# Patient Record
Sex: Female | Born: 1955 | Race: White | Hispanic: No | Smoking: Former smoker
Health system: Southern US, Community
[De-identification: ages and names within clinical notes are randomized; demographics above are authoritative.]

## PROBLEM LIST (undated history)

## (undated) DIAGNOSIS — IMO0001 Reserved for inherently not codable concepts without codable children: Secondary | ICD-10-CM

## (undated) DIAGNOSIS — E1169 Type 2 diabetes mellitus with other specified complication: Secondary | ICD-10-CM

## (undated) DIAGNOSIS — I10 Essential (primary) hypertension: Secondary | ICD-10-CM

## (undated) DIAGNOSIS — I480 Paroxysmal atrial fibrillation: Secondary | ICD-10-CM

## (undated) DIAGNOSIS — I201 Angina pectoris with documented spasm: Secondary | ICD-10-CM

## (undated) DIAGNOSIS — A0472 Enterocolitis due to Clostridium difficile, not specified as recurrent: Secondary | ICD-10-CM

## (undated) DIAGNOSIS — R079 Chest pain, unspecified: Secondary | ICD-10-CM

## (undated) DIAGNOSIS — K219 Gastro-esophageal reflux disease without esophagitis: Secondary | ICD-10-CM

## (undated) DIAGNOSIS — I5042 Chronic combined systolic (congestive) and diastolic (congestive) heart failure: Secondary | ICD-10-CM

## (undated) DIAGNOSIS — E669 Obesity, unspecified: Secondary | ICD-10-CM

## (undated) DIAGNOSIS — E785 Hyperlipidemia, unspecified: Secondary | ICD-10-CM

## (undated) DIAGNOSIS — J449 Chronic obstructive pulmonary disease, unspecified: Secondary | ICD-10-CM

## (undated) DIAGNOSIS — G8929 Other chronic pain: Secondary | ICD-10-CM

## (undated) DIAGNOSIS — J9610 Chronic respiratory failure, unspecified whether with hypoxia or hypercapnia: Secondary | ICD-10-CM

## (undated) DIAGNOSIS — Z8679 Personal history of other diseases of the circulatory system: Secondary | ICD-10-CM

## (undated) DIAGNOSIS — Z720 Tobacco use: Secondary | ICD-10-CM

## (undated) HISTORY — DX: Hyperlipidemia, unspecified: E78.5

## (undated) HISTORY — DX: Tobacco use: Z72.0

## (undated) HISTORY — DX: Chronic obstructive pulmonary disease, unspecified: J44.9

## (undated) HISTORY — DX: Other chronic pain: G89.29

## (undated) HISTORY — PX: CHOLECYSTECTOMY: SHX55

## (undated) HISTORY — DX: Gastro-esophageal reflux disease without esophagitis: K21.9

## (undated) HISTORY — DX: Essential (primary) hypertension: I10

## (undated) HISTORY — PX: PARTIAL HYSTERECTOMY: SHX80

## (undated) HISTORY — PX: ANGIOPLASTY: SHX39

## (undated) HISTORY — DX: Morbid (severe) obesity due to excess calories: E66.01

---

## 1997-10-07 ENCOUNTER — Emergency Department (HOSPITAL_COMMUNITY): Admission: EM | Admit: 1997-10-07 | Discharge: 1997-10-07 | Payer: Self-pay | Admitting: Emergency Medicine

## 1998-01-25 ENCOUNTER — Emergency Department (HOSPITAL_COMMUNITY): Admission: EM | Admit: 1998-01-25 | Discharge: 1998-01-25 | Payer: Self-pay | Admitting: Emergency Medicine

## 1998-01-25 ENCOUNTER — Encounter: Payer: Self-pay | Admitting: Emergency Medicine

## 2004-03-12 ENCOUNTER — Inpatient Hospital Stay (HOSPITAL_COMMUNITY): Admission: EM | Admit: 2004-03-12 | Discharge: 2004-03-15 | Payer: Self-pay | Admitting: Emergency Medicine

## 2004-03-17 ENCOUNTER — Emergency Department (HOSPITAL_COMMUNITY): Admission: EM | Admit: 2004-03-17 | Discharge: 2004-03-17 | Payer: Self-pay | Admitting: Emergency Medicine

## 2006-01-15 ENCOUNTER — Emergency Department (HOSPITAL_COMMUNITY): Admission: EM | Admit: 2006-01-15 | Discharge: 2006-01-15 | Payer: Self-pay | Admitting: Emergency Medicine

## 2009-08-15 ENCOUNTER — Emergency Department (HOSPITAL_COMMUNITY): Admission: EM | Admit: 2009-08-15 | Discharge: 2009-08-16 | Payer: Self-pay | Admitting: Emergency Medicine

## 2009-11-15 ENCOUNTER — Ambulatory Visit: Payer: Self-pay | Admitting: Internal Medicine

## 2009-11-15 ENCOUNTER — Inpatient Hospital Stay (HOSPITAL_COMMUNITY): Admission: RE | Admit: 2009-11-15 | Discharge: 2009-11-20 | Payer: Self-pay | Admitting: Surgery

## 2009-12-03 HISTORY — PX: UMBILICAL HERNIA REPAIR: SHX196

## 2010-05-18 LAB — BLOOD GAS, ARTERIAL
Acid-Base Excess: 5.2 mmol/L — ABNORMAL HIGH (ref 0.0–2.0)
Bicarbonate: 31.1 mEq/L — ABNORMAL HIGH (ref 20.0–24.0)
FIO2: 0.5 %
MECHVT: 550 mL
TCO2: 28 mmol/L (ref 0–100)
TCO2: 29.1 mmol/L (ref 0–100)
pCO2 arterial: 63.5 mmHg (ref 35.0–45.0)
pCO2 arterial: 64.1 mmHg (ref 35.0–45.0)
pH, Arterial: 7.308 — ABNORMAL LOW (ref 7.350–7.400)
pO2, Arterial: 95.1 mmHg (ref 80.0–100.0)

## 2010-05-18 LAB — CBC
HCT: 38.1 % (ref 36.0–46.0)
HCT: 38.2 % (ref 36.0–46.0)
Hemoglobin: 12.7 g/dL (ref 12.0–15.0)
Hemoglobin: 15.4 g/dL — ABNORMAL HIGH (ref 12.0–15.0)
MCH: 30.9 pg (ref 26.0–34.0)
MCH: 30.9 pg (ref 26.0–34.0)
MCHC: 33.7 g/dL (ref 30.0–36.0)
Platelets: 223 10*3/uL (ref 150–400)
RBC: 4.12 MIL/uL (ref 3.87–5.11)
RBC: 4.61 MIL/uL (ref 3.87–5.11)
RDW: 14.7 % (ref 11.5–15.5)
RDW: 14.4 % (ref 11.5–15.5)
WBC: 6.9 10*3/uL (ref 4.0–10.5)
WBC: 12.7 10*3/uL — ABNORMAL HIGH (ref 4.0–10.5)
WBC: 9.5 10*3/uL (ref 4.0–10.5)

## 2010-05-18 LAB — GLUCOSE, CAPILLARY
Glucose-Capillary: 127 mg/dL — ABNORMAL HIGH (ref 70–99)
Glucose-Capillary: 138 mg/dL — ABNORMAL HIGH (ref 70–99)
Glucose-Capillary: 138 mg/dL — ABNORMAL HIGH (ref 70–99)
Glucose-Capillary: 139 mg/dL — ABNORMAL HIGH (ref 70–99)
Glucose-Capillary: 150 mg/dL — ABNORMAL HIGH (ref 70–99)
Glucose-Capillary: 160 mg/dL — ABNORMAL HIGH (ref 70–99)
Glucose-Capillary: 176 mg/dL — ABNORMAL HIGH (ref 70–99)
Glucose-Capillary: 177 mg/dL — ABNORMAL HIGH (ref 70–99)
Glucose-Capillary: 191 mg/dL — ABNORMAL HIGH (ref 70–99)
Glucose-Capillary: 109 mg/dL — ABNORMAL HIGH (ref 70–99)
Glucose-Capillary: 128 mg/dL — ABNORMAL HIGH (ref 70–99)
Glucose-Capillary: 137 mg/dL — ABNORMAL HIGH (ref 70–99)
Glucose-Capillary: 153 mg/dL — ABNORMAL HIGH (ref 70–99)
Glucose-Capillary: 196 mg/dL — ABNORMAL HIGH (ref 70–99)
Glucose-Capillary: 196 mg/dL — ABNORMAL HIGH (ref 70–99)

## 2010-05-18 LAB — CREATININE, SERUM
GFR calc Af Amer: 60 mL/min — ABNORMAL LOW (ref >60)
GFR calc Af Amer: >60 mL/min (ref >60)
GFR calc non Af Amer: 49 mL/min — ABNORMAL LOW (ref >60)
GFR calc non Af Amer: >60 mL/min (ref >60)

## 2010-05-18 LAB — COMPREHENSIVE METABOLIC PANEL
Alkaline Phosphatase: 76 U/L (ref 39–117)
BUN: 21 mg/dL (ref 6–23)
Creatinine, Ser: 0.96 mg/dL (ref 0.4–1.2)
Glucose, Bld: 164 mg/dL — ABNORMAL HIGH (ref 70–99)
Potassium: 4.5 mEq/L (ref 3.5–5.1)
Total Bilirubin: 0.8 mg/dL (ref 0.3–1.2)
Total Protein: 6.9 g/dL (ref 6.0–8.3)

## 2010-05-18 LAB — BASIC METABOLIC PANEL
BUN: 20 mg/dL (ref 6–23)
GFR calc Af Amer: >60 mL/min (ref >60)
GFR calc non Af Amer: 56 mL/min — ABNORMAL LOW (ref >60)
Potassium: 4.7 mEq/L (ref 3.5–5.1)
Sodium: 136 mEq/L (ref 135–145)

## 2010-05-18 LAB — POTASSIUM: Potassium: 4.3 mEq/L (ref 3.5–5.1)

## 2010-05-18 LAB — MAGNESIUM: Magnesium: 1.3 mg/dL — ABNORMAL LOW (ref 1.5–2.5)

## 2010-05-18 LAB — SURGICAL PCR SCREEN
MRSA, PCR: NEGATIVE
Staphylococcus aureus: POSITIVE — AB

## 2010-05-18 LAB — PHOSPHORUS: Phosphorus: 3.2 mg/dL (ref 2.3–4.6)

## 2010-05-22 LAB — CBC
HCT: 47.6 % — ABNORMAL HIGH (ref 36.0–46.0)
Hemoglobin: 15.8 g/dL — ABNORMAL HIGH (ref 12.0–15.0)
MCHC: 33.2 g/dL (ref 30.0–36.0)
RBC: 5.1 MIL/uL (ref 3.87–5.11)
RDW: 15 % (ref 11.5–15.5)

## 2010-05-22 LAB — DIFFERENTIAL
Basophils Absolute: 0 10*3/uL (ref 0.0–0.1)
Basophils Relative: 1 % (ref 0–1)
Eosinophils Relative: 1 % (ref 0–5)
Lymphocytes Relative: 20 % (ref 12–46)
Monocytes Absolute: 0.5 10*3/uL (ref 0.1–1.0)
Monocytes Relative: 6 % (ref 3–12)
Neutro Abs: 6.4 10*3/uL (ref 1.7–7.7)

## 2010-05-22 LAB — PROTIME-INR: INR: 0.93 (ref 0.00–1.49)

## 2010-05-22 LAB — URINE CULTURE

## 2010-05-22 LAB — URINALYSIS, ROUTINE W REFLEX MICROSCOPIC
Nitrite: NEGATIVE
Specific Gravity, Urine: 1.024 (ref 1.005–1.030)
pH: 6 (ref 5.0–8.0)

## 2010-05-22 LAB — POCT CARDIAC MARKERS
CKMB, poc: 3.5 ng/mL (ref 1.0–8.0)
Myoglobin, poc: 139 ng/mL (ref 12–200)
Troponin i, poc: <0.05 ng/mL (ref 0.00–0.09)
Troponin i, poc: <0.05 ng/mL (ref 0.00–0.09)

## 2010-05-22 LAB — POCT I-STAT, CHEM 8
BUN: 34 mg/dL — ABNORMAL HIGH (ref 6–23)
Calcium, Ion: 1.19 mmol/L (ref 1.12–1.32)
Chloride: 97 mEq/L (ref 96–112)
Glucose, Bld: 190 mg/dL — ABNORMAL HIGH (ref 70–99)
TCO2: 38 mmol/L (ref 0–100)

## 2010-05-22 LAB — HEPATIC FUNCTION PANEL
ALT: 35 U/L (ref 0–35)
AST: 33 U/L (ref 0–37)
Bilirubin, Direct: 0.1 mg/dL (ref 0.0–0.3)
Indirect Bilirubin: 0.2 mg/dL — ABNORMAL LOW (ref 0.3–0.9)
Total Bilirubin: 0.3 mg/dL (ref 0.3–1.2)

## 2010-07-06 ENCOUNTER — Encounter: Payer: Self-pay | Admitting: Pulmonary Disease

## 2010-07-07 ENCOUNTER — Ambulatory Visit (INDEPENDENT_AMBULATORY_CARE_PROVIDER_SITE_OTHER): Payer: Self-pay | Admitting: Pulmonary Disease

## 2010-07-07 ENCOUNTER — Encounter: Payer: Self-pay | Admitting: Pulmonary Disease

## 2010-07-07 ENCOUNTER — Encounter: Payer: Self-pay | Admitting: *Deleted

## 2010-07-07 DIAGNOSIS — J449 Chronic obstructive pulmonary disease, unspecified: Secondary | ICD-10-CM

## 2010-07-07 DIAGNOSIS — E669 Obesity, unspecified: Secondary | ICD-10-CM

## 2010-07-07 DIAGNOSIS — F17200 Nicotine dependence, unspecified, uncomplicated: Secondary | ICD-10-CM

## 2010-07-07 DIAGNOSIS — Z720 Tobacco use: Secondary | ICD-10-CM

## 2010-07-07 MED ORDER — DOXYCYCLINE HYCLATE 100 MG PO TABS: 100.0000 mg | ORAL_TABLET | Freq: Every day | ORAL | Status: AC

## 2010-07-07 NOTE — Patient Instructions (Signed)
You have to commit to quit smoking. Weight loss is recommended - we can refer you to a pulmonary rehab program Antibiotic x 10 days Letter for work  Stay on oxygen

## 2010-07-07 NOTE — Progress Notes (Signed)
  Subjective:    Patient ID: [name], female    DOB: [birth year], 55 y.o.   MRN: [id number]  HPI 54/f , obese , 1 PPd smoker for evlauation of dyspnea. She is a Production assistant, radio at [employer], reports  sob x 1 yr, worse in -walking, steps C/o Occasional wheeze, now has green phlegm with cough x 3 weeks Using nebs x 5-6 times/d On O2 2L/min since admission 9/13-18/11 for incarcerated umbilical hernia complicated by post op resp failure requiring mechanical ventilation. She reports sporadic compliance with this. She has cut down form 1.5 to 1 PPD. She is unable to afford medciations bt has been getting a supply form her mother of symbicort & singulair. She denies heartburn, sinus drainage, seasonal allergies or excessive daytime somnolence. She wants a letter for disability.     Review of Systems  Constitutional: Positive for appetite change and unexpected weight change. Negative for fever.  HENT: Positive for congestion, sore throat and sneezing. Negative for ear pain, nosebleeds, rhinorrhea, trouble swallowing, dental problem, postnasal drip and sinus pressure.   Eyes: Positive for redness and itching.  Respiratory: Positive for cough, chest tightness and shortness of breath. Negative for wheezing.   Cardiovascular: Negative for palpitations and leg swelling.  Gastrointestinal: Negative for nausea and vomiting.  Genitourinary: Negative for dysuria.  Musculoskeletal: Positive for joint swelling.  Skin: Negative for rash.  Neurological: Positive for light-headedness. Negative for headaches.  Hematological: Does not bruise/bleed easily.  Psychiatric/Behavioral: Positive for dysphoric mood. The patient is nervous/anxious.        Objective:   Physical Exam    Gen. Pleasant, obese, in no distress, normal affect ENT - no lesions, no post nasal drip Neck: No JVD, no thyromegaly, no carotid bruits Lungs: no use of accessory muscles, no dullness to percussion, decreased BL bases  without rales or rhonchi  Cardiovascular: Rhythm regular, heart sounds  normal, no murmurs or gallops, no peripheral edema Abdomen: soft and non-tender, no hepatosplenomegaly, BS normal. Musculoskeletal: No deformities, no cyanosis or clubbing Neuro:  alert, non focal     Assessment & Plan:

## 2010-07-10 DIAGNOSIS — Z720 Tobacco use: Secondary | ICD-10-CM | POA: Insufficient documentation

## 2010-07-10 DIAGNOSIS — J449 Chronic obstructive pulmonary disease, unspecified: Secondary | ICD-10-CM | POA: Insufficient documentation

## 2010-07-10 DIAGNOSIS — E669 Obesity, unspecified: Secondary | ICD-10-CM | POA: Insufficient documentation

## 2010-07-10 NOTE — Assessment & Plan Note (Signed)
Likely on the basis of heavy smoking. Lung function not estimated. Clearly, tobacco cessation is the primary intervention here.I did not obtian spirometry today - the only reason to do this would be to motivate her to quit. We discussed lung changes in COPD & prognosis in a smoker. Ct symbicort & singulair. I explained that meds only provide symptomatic relief. OK to use albuterol  Nebs prn. WIll treat as flare with doxy course. Pulm rehab in future if she will make an effort to quit. ENcouraged compliance with O2. Letter given for work - she will make disability application & likely have her lung function estimated there.

## 2010-07-10 NOTE — Assessment & Plan Note (Signed)
Likely has obstructive sleep apnea on the basis of obesity & narrow pharyngeal exam. May have to explore in the future.

## 2010-07-21 NOTE — Discharge Summary (Signed)
[name], [name]             ACCOUNT NO.:  [id number]   MEDICAL RECORD NO.:  [id number]          PATIENT TYPE:  INP   LOCATION:  2004                         FACILITY:  [facility]   PHYSICIAN:  Eduardo Osier. Sharyn Lull, M.D. DATE OF BIRTH:  06-25-1955   DATE OF ADMISSION:  03/12/2004  DATE OF DISCHARGE:  03/15/2004                                 DISCHARGE SUMMARY   ADMISSION DIAGNOSES:  1.  Chest pain rule out myocardial infarction.  2.  Hypertension.  3.  Diabetes mellitus.  4.  Morbid obesity.  5.  Tobacco abuse.   DISCHARGE DIAGNOSES:  1.  Status post small non-Q-wave myocardial infarction probably secondary to      vasospasm.  2.  Hypertension.  3.  New onset diabetes mellitus.  4.  Morbid obesity.  5.  Tobacco abuse.  6.  Hypercholesterolemia.  7.  Resolving left pneumonia.   DISCHARGE MEDICATIONS:  1.  Enteric-coated aspirin 81 mg 1 tablet daily.  2.  Toprol-XL 50 mg 1 tablet daily.  3.  Imdur 30 mg 1 tablet daily in the morning.  4.  Altace 10 mg 1 capsule daily.  5.  Lipitor 10 mg 1 tablet daily.  6.  Actos 30 mg 1 tablet daily.  7.  Glucotrol XL 10 mg 1 tablet twice daily.  8.  Nitrostat 0.4 mg sublingual use as directed.  9.  Flexeril 10 mg twice daily as needed for leg cramps.  10. Avelox 400 mg 1 tablet daily for 7 days.   ACTIVITY:  Avoid heavy lifting, pushing or pulling for 48 hours.   DIET:  Low salt, low cholesterol 1800 calorie ADA weight reducing diet.  Monitor blood sugars twice daily and chart.   FOLLOW UP:  1.  With me in 1 week.  2.  Follow up with primary medical doctor as scheduled.   CONDITION ON DISCHARGE:  Stable.   BRIEF HISTORY OF PRESENT ILLNESS:  [name] is a 55 year old white female  with Past Medical History significant for hypertension, new onset diabetes  mellitus, morbid obesity, tobacco abuse, family history of coronary artery  disease.  She came to the ER complaining of retrosternal chest tightness.  The patient has  complained of similar discomfort in the past and has been  followed by [facility] but she did not seek follow  up.  She cannot remember when the chest pain first started but states it is  exertional and only partially relieved by rest.  She complains of associated  radiation to both arms, left more than the right.  She denies any dyspnea,  syncope, palpitation at present but also gives history of exertional dyspnea  in the past.  She was recently diagnosed with diabetes mellitus but has  taken no medication.  No prior history of MI.  She also reports smoking,  positive family history of coronary artery disease as well.  When seen by  Dr. Shana Chute in the ED she was chest-pain free and she was accompanied by the  husband.   PAST MEDICAL HISTORY:  As above.   SOCIAL HISTORY:  She smokes.  No history of alcohol abuse.  She is married  and lives with husband.   FAMILY HISTORY:  Her brother has coronary artery disease.  Grandmother and  father also have coronary artery disease.   HOME MEDICATIONS:  She was on Advair, Singulair, Maxzide, nitroglycerin,  Neurontin and aspirin.   PHYSICAL EXAMINATION:  VITAL SIGNS:  Her blood pressure is 101/60, pulse 100  and regular.  GENERAL:  She was obese in moderate respiratory distress.  NECK:  No JVD, no bruit.  LUNGS:  No chest wall tenderness.  Lungs are clear to auscultation.  HEART:  S1 & S2 was normal but distant.  No rubs, no thrills.  ABDOMEN:  Soft with active bowel sounds.  EXTREMITIES:  No clubbing, cyanosis, or edema.  NEUROLOGICAL:  Grossly intact.   Chest x-ray showed patchy atelectasis versus pneumonia.  EKG showed normal  sinus rhythm with left bundle branch block.   LABORATORY DATA:  Toxicology testing was negative for cocaine, amphetamines,  or benzodiazepines.  Her lipid panel:  Cholesterol 171, LDL 91, HDL 47,  triglycerides 164.  Her cardiac markers:  CK was 578, MB 16.8, relative  index 2.9.  Second  set CK 399, MB 10.7, relative index 2.7.  Third set CK  343, MB 8.6, relative index 2.5.  Fourth set CK 252, MB 4.6, relative index  1.8.  Troponin-I, however, 3 sets are negative at 0.02, 0.01. and 0.01.  Her  hemoglobin A1C was very high at 11.6.  Her AST was 32, ALP 46, alkaline  phosphatase 61, total bilirubin 0.6, magnesium 1.6.  Sodium 133, potassium  3.7, chloride 99, glucose 237, BUN 31, creatinine 1.1.  Hemoglobin was 14.5,  hematocrit 43.4, white count 11.3.  Repeat hemoglobin on March 15, 2004  was 12.7, hematocrit 35.5, white count of 11.6.   BRIEF HOSPITAL COURSE:  The patient was admitted to telemetry unit.  The  patient ruled in for a small non-Q-wave myocardial infarction due to  atypical angina chest pain and mildly elevated CPK/MBs.  The patient  subsequently underwent left cath.  As per the procedure report, the patient  tolerated the procedure well.  There were no complications.  The patient did  not have any further episodes of chest pain during the hospital stay.  The  patient's groin is stable.  There is no evidence of hematoma or bruit.  The  patient has been ambulating in the hallway without  any problem.  Her blood sugars has been fluctuating between 200-250 today.  Her Glucotrol has been increased.  The patient has been advised to monitor  blood sugars twice daily and home and will be followed up closely in my  office in 1 week and primary medical doctor as scheduled.       MNH/MEDQ  D:  03/15/2004  T:  03/15/2004  Job:  [id number]   cc:   Osvaldo Shipper. Spruill, M.D.  [address] [id number]  [place]  [place] [address]  Fax: [phone]

## 2010-07-21 NOTE — Cardiovascular Report (Signed)
NAMEEYMI, LIPUMA             ACCOUNT NO.:  [id number]   MEDICAL RECORD NO.:  [id number]          PATIENT TYPE:  INP   LOCATION:  2004                         FACILITY:  [facility]   PHYSICIAN:  Eduardo Osier. Sharyn Lull, M.D. DATE OF BIRTH:  15-Jul-1955   DATE OF PROCEDURE:  03/15/2004  DATE OF DISCHARGE:                              CARDIAC CATHETERIZATION   PROCEDURE:  Left cardiac catheterization with selective left and right  coronary angiography, left ventriculogram of via the right groin using  Judkins technique.   INDICATIONS FOR PROCEDURE:  Ms. Mclaren is a 55 year old white female with  past medical history significant for hypertension, new onset diabetes  mellitus, hypercholesterolemia, morbid obesity, positive family history of  coronary artery disease, history of tobacco abuse, was admitted by Dr.  Shana Chute on March 12, 2004, because of substernal chest tightness off and  on.  The patient has been being followed by [facility] but  has not sought follow up with them.  The patient cannot remember when she  had first chest pain but states it is exertional and only partially relieved  by rest.  EKG done in the ER showed right bundle branch block but there were  no acute ischemic changes.  Her cardiac enzymes and CPK MB were  significantly elevated with CK 578, MB 16.8, with index 2.9, second set CK  399, MB 10.7, index 2.7, third set CK 343, MB 8.6, and index 2.5.  However,  two sets of troponin I were negative.  Due to typical anginal chest pain,  mildly elevated CPK MB, and multiple risk factors, discussed with the  patient regarding left cath, possible PTCA  stenting, need for emergency CABG, local vascular complications, and she  consented for PCI.   DESCRIPTION OF PROCEDURE:  After obtaining informed consent, the patient was  brought to the cath lab and placed on fluoroscopy table.  The right groin  was prepped and draped in the usual fashion.  2% Xylocaine was  used for  local anesthesia in the right groin.  With the help of thin wall needle, 6  French arterial sheath was placed.  The sheath was aspirated and flushed.  Next, 6 French left Judkins catheter was advanced over the wire under  fluoroscopic guidance to the ascending aorta.  The wire was pulled out, the  catheter was aspirated, and connected to the manifold.  The catheter was  further advanced and engaged into the left coronary ostium.  Multiple views  of the left system were taken.  Next, the catheter was disengaged and was  pulled out over the wire and was replaced with 6 French right Judkins  catheter which was advanced over the wire under fluoroscopic guidance up to  the ascending aorta.  The wire was pulled out, the catheter was aspirated  and connected to the manifold.  The catheter was further advanced and  engaged into right coronary ostium.  Multiple views of the right system were  taken.  Next, catheter was disengaged and was pulled out over the wire and  was replaced with 6 French pigtail catheter which was advanced  over the wire  under fluoroscopic guidance up to the ascending aorta.  The wire was pulled  out, the catheter was aspirated and connected to the manifold.  The catheter  was further advanced across the aortic valve into the LV.  LV pressures were  recorded.  Next, LV-graph was done in 30 degree RAO position.  Post  angiographic pressures were recorded from LV and then pull back pressures  were recorded from the aorta.  There was no gradient across the aortic  valve.  Next, the pigtail catheter was pulled out over the wire, the sheaths  were aspirated and flushed.   FINDINGS:  LV showed mild global hypokinesis, EF approximately 45%.  There  was 2+ catheter induced MR. The left main was patent.  The LAD was patent.  Diagonal one was very small which was patent.  Diagonal two was small which  was patent.  The left circumflex was patent and placed in AV groove after   giving off large OM1.  OM1 was large and patent which bifurcates into PDA  and inferior branches which were patent which further branches into  superior and inferior branches.  The RCA was patent.  The patient had  catheter induced spasm in the proximal and ostial RCA which resolved with  nitroglycerin.  The patient tolerated the procedure well, there were no  complications.  The patient was transferred to the recovery room in stable  condition.       MNH/MEDQ  D:  03/15/2004  T:  03/15/2004  Job:  [id number]   cc:   Osvaldo Shipper. Spruill, M.D.  [address] [id number]  [place]  [place] [address]  Fax: [phone]

## 2010-11-29 ENCOUNTER — Telehealth: Payer: Self-pay | Admitting: Pulmonary Disease

## 2010-11-29 NOTE — Telephone Encounter (Signed)
Checked with RA and he has not received fax. I called and lmom stating we have not received fax and to resend to [phone].

## 2011-02-01 ENCOUNTER — Other Ambulatory Visit: Payer: Self-pay | Admitting: Cardiology

## 2011-02-15 ENCOUNTER — Other Ambulatory Visit: Payer: Self-pay | Admitting: Cardiology

## 2011-07-13 ENCOUNTER — Other Ambulatory Visit: Payer: Self-pay | Admitting: Cardiology

## 2011-10-19 ENCOUNTER — Encounter (HOSPITAL_COMMUNITY): Payer: Self-pay

## 2011-10-19 ENCOUNTER — Observation Stay (HOSPITAL_COMMUNITY)
Admission: EM | Admit: 2011-10-19 | Discharge: 2011-10-19 | Disposition: A | Discharge status: Home / Self Care | Payer: Medicaid Other | Attending: Emergency Medicine | Admitting: Emergency Medicine

## 2011-10-19 DIAGNOSIS — Z9114 Patient's other noncompliance with medication regimen: Secondary | ICD-10-CM

## 2011-10-19 DIAGNOSIS — Z794 Long term (current) use of insulin: Secondary | ICD-10-CM | POA: Insufficient documentation

## 2011-10-19 DIAGNOSIS — E785 Hyperlipidemia, unspecified: Secondary | ICD-10-CM | POA: Insufficient documentation

## 2011-10-19 DIAGNOSIS — F17200 Nicotine dependence, unspecified, uncomplicated: Secondary | ICD-10-CM | POA: Insufficient documentation

## 2011-10-19 DIAGNOSIS — R739 Hyperglycemia, unspecified: Secondary | ICD-10-CM

## 2011-10-19 DIAGNOSIS — Z91199 Patient's noncompliance with other medical treatment and regimen due to unspecified reason: Secondary | ICD-10-CM | POA: Insufficient documentation

## 2011-10-19 DIAGNOSIS — J4489 Other specified chronic obstructive pulmonary disease: Secondary | ICD-10-CM | POA: Insufficient documentation

## 2011-10-19 DIAGNOSIS — J449 Chronic obstructive pulmonary disease, unspecified: Secondary | ICD-10-CM | POA: Insufficient documentation

## 2011-10-19 DIAGNOSIS — K219 Gastro-esophageal reflux disease without esophagitis: Secondary | ICD-10-CM | POA: Insufficient documentation

## 2011-10-19 DIAGNOSIS — E119 Type 2 diabetes mellitus without complications: Principal | ICD-10-CM | POA: Insufficient documentation

## 2011-10-19 DIAGNOSIS — Z9119 Patient's noncompliance with other medical treatment and regimen: Secondary | ICD-10-CM | POA: Insufficient documentation

## 2011-10-19 LAB — URINALYSIS, ROUTINE W REFLEX MICROSCOPIC
Ketones, ur: NEGATIVE mg/dL
Leukocytes, UA: NEGATIVE
Nitrite: NEGATIVE
Protein, ur: 30 mg/dL — AB
pH: 5.5 (ref 5.0–8.0)

## 2011-10-19 LAB — CBC WITH DIFFERENTIAL/PLATELET
Basophils Absolute: 0 10*3/uL (ref 0.0–0.1)
Eosinophils Relative: 1 % (ref 0–5)
Lymphocytes Relative: 18 % (ref 12–46)
MCV: 92 fL (ref 78.0–100.0)
Neutro Abs: 7.1 10*3/uL (ref 1.7–7.7)
Neutrophils Relative %: 76 % (ref 43–77)
Platelets: 240 10*3/uL (ref 150–400)
RBC: 5.25 MIL/uL — ABNORMAL HIGH (ref 3.87–5.11)
RDW: 13.7 % (ref 11.5–15.5)
WBC: 9.4 10*3/uL (ref 4.0–10.5)

## 2011-10-19 LAB — BASIC METABOLIC PANEL
Calcium: 9.4 mg/dL (ref 8.4–10.5)
Creatinine, Ser: 0.92 mg/dL (ref 0.50–1.10)
GFR calc Af Amer: 79 mL/min — ABNORMAL LOW (ref >90)
GFR calc non Af Amer: 68 mL/min — ABNORMAL LOW (ref >90)
Sodium: 132 mEq/L — ABNORMAL LOW (ref 135–145)

## 2011-10-19 LAB — GLUCOSE, CAPILLARY

## 2011-10-19 LAB — URINE MICROSCOPIC-ADD ON

## 2011-10-19 MED ORDER — SODIUM CHLORIDE 0.9 % IV SOLN
INTRAVENOUS | Status: DC
  Administered 2011-10-19: 1000 mL via INTRAVENOUS

## 2011-10-19 MED ORDER — IPRATROPIUM BROMIDE 0.02 % IN SOLN
0.5000 mg | Freq: Once | RESPIRATORY_TRACT | Status: AC
  Administered 2011-10-19: 0.5 mg via RESPIRATORY_TRACT
  Filled 2011-10-19: qty 2.5

## 2011-10-19 MED ORDER — SODIUM CHLORIDE 0.9 % IV BOLUS (SEPSIS)
1000.0000 mL | Freq: Once | INTRAVENOUS | Status: AC
  Administered 2011-10-19: 1000 mL via INTRAVENOUS

## 2011-10-19 MED ORDER — ALBUTEROL SULFATE (5 MG/ML) 0.5% IN NEBU
5.0000 mg | INHALATION_SOLUTION | Freq: Once | RESPIRATORY_TRACT | Status: AC
  Administered 2011-10-19: 5 mg via RESPIRATORY_TRACT
  Filled 2011-10-19: qty 1

## 2011-10-19 MED ORDER — ACETAMINOPHEN 325 MG PO TABS: 650.0000 mg | ORAL_TABLET | ORAL | Status: DC | PRN

## 2011-10-19 MED ORDER — INSULIN ASPART 100 UNIT/ML ~~LOC~~ SOLN
20.0000 [IU] | Freq: Once | SUBCUTANEOUS | Status: AC
  Administered 2011-10-19: 20 [IU] via INTRAVENOUS
  Filled 2011-10-19: qty 1

## 2011-10-19 MED ORDER — ONDANSETRON HCL 4 MG/2ML IJ SOLN: 4.0000 mg | Freq: Three times a day (TID) | INTRAMUSCULAR | Status: DC | PRN

## 2011-10-19 NOTE — ED Notes (Signed)
Case manager jennifer is not coming to see pt in ed. She wanted to talk to pt on phone. She advises "there is nothing i can do for her other than help her get a glucometer if the dr will fill out the form"

## 2011-10-19 NOTE — ED Provider Notes (Addendum)
History     CSN: [id number]  Arrival date & time 10/19/11  0906   First MD Initiated Contact with Patient 10/19/11 [phone]      Chief Complaint  Patient presents with  . Hyperglycemia    (Consider location/radiation/quality/duration/timing/severity/associated sxs/prior treatment) HPI Comments: [name] is a 56 y.o. Female who is here for hyperglycemia. She ran out of her insulin. One month ago, she is about to run out of her metformin. She has Medicaid, but cannot afford co-pays for medications. She saw a new primary care provider 5 days ago. She denies weakness, dizziness, nausea, vomiting, chest pain, shortness of breath, change in bowel or urinary habits. There are no aggravating or palliative factors.   The history is provided by the patient.    Past Medical History  Diagnosis Date  . COPD (chronic obstructive pulmonary disease)   . Respiratory failure   . Morbid obesity   . Tobacco abuse   . GERD (gastroesophageal reflux disease)   . Hyperlipidemia   . Chronic pain   . Hypertension   . Heart attack   . Arthritis   . Allergic rhinitis   . Diabetes mellitus, type 2     Past Surgical History  Procedure Date  . Cholecystectomy   . Partial hysterectomy   . Angioplasty   . Umbilical hernia repair 12/2009    History reviewed. No pertinent family history.  History  Substance Use Topics  . Smoking status: Current Everyday Smoker -- 1.0 packs/day for 40 years    Types: Cigarettes  . Smokeless tobacco: Never Used  . Alcohol Use: No    OB History    Grav Para Term Preterm Abortions TAB SAB Ect Mult Living                  Review of Systems  All other systems reviewed and are negative.    Allergies  Review of patient's allergies indicates no known allergies.  Home Medications   Current Outpatient Rx  Name Route Sig Dispense Refill  . ALBUTEROL SULFATE HFA 108 (90 BASE) MCG/ACT IN AERS Inhalation Inhale 2 puffs into the lungs every 6 (six) hours as  needed.      . ASPIRIN 81 MG PO TABS Oral Take 81 mg by mouth 2 (two) times daily.      . BUDESONIDE-FORMOTEROL FUMARATE 160-4.5 MCG/ACT IN AERO Inhalation Inhale 2 puffs into the lungs 2 (two) times daily.      . CYCLOBENZAPRINE HCL 10 MG PO TABS Oral Take 10 mg by mouth 3 (three) times daily.     . FUROSEMIDE 40 MG PO TABS Oral Take 40 mg by mouth daily.      Marland Kitchen HYDROCODONE-ACETAMINOPHEN 5-300 MG PO TABS Oral Take 1 tablet by mouth every 4 (four) hours as needed. For pain    . INSULIN ISOPHANE & REGULAR (70-30) 100 UNIT/ML Louise SUSP Subcutaneous Inject 70 Units into the skin as needed.     . ISOSORBIDE MONONITRATE ER 30 MG PO TB24 Oral Take 30 mg by mouth daily.      Marland Kitchen METFORMIN HCL 1000 MG PO TABS Oral Take 1,000 mg by mouth 2 (two) times daily with a meal.      . METOPROLOL SUCCINATE ER 50 MG PO TB24 Oral Take 50 mg by mouth daily.      Marland Kitchen OLMESARTAN MEDOXOMIL 20 MG PO TABS Oral Take 20 mg by mouth daily.    Marland Kitchen ROPINIROLE HCL 2 MG PO TABS Oral Take 2  mg by mouth at bedtime.      BP 111/69  Pulse 78  Temp 97.8 F (36.6 C) (Oral)  Resp 20  SpO2 94%  Physical Exam  Nursing note and vitals reviewed. Constitutional: She is oriented to person, place, and time. She appears well-developed and well-nourished.  HENT:  Head: Normocephalic and atraumatic.       Dry mucous membranes  Eyes: Conjunctivae and EOM are normal. Pupils are equal, round, and reactive to light.  Neck: Normal range of motion and phonation normal. Neck supple.  Cardiovascular: Normal rate, regular rhythm and intact distal pulses.   Pulmonary/Chest: Effort normal and breath sounds normal. She exhibits no tenderness.  Abdominal: Soft. She exhibits no distension. There is no tenderness. There is no guarding.  Musculoskeletal: Normal range of motion.  Neurological: She is alert and oriented to person, place, and time. She has normal strength. She exhibits normal muscle tone.  Skin: Skin is warm and dry.  Psychiatric: She has a  normal mood and affect. Her behavior is normal. Judgment and thought content normal.    ED Course  Procedures (including critical care time)  Emergency department treatment: IV fluid bolus, and drip   Case manager consultation for assistance with medications  Transfer to CDU for treatment.  Labs Reviewed  BASIC METABOLIC PANEL - Abnormal; Notable for the following:    Sodium 132 (*)     Chloride 90 (*)     Glucose, Bld 477 (*)     GFR calc non Af Amer 68 (*)     GFR calc Af Amer 79 (*)     All other components within normal limits  CBC WITH DIFFERENTIAL - Abnormal; Notable for the following:    RBC 5.25 (*)     Hemoglobin 16.4 (*)     HCT 48.3 (*)     All other components within normal limits  URINALYSIS, ROUTINE W REFLEX MICROSCOPIC - Abnormal; Notable for the following:    APPearance HAZY (*)     Specific Gravity, Urine 1.035 (*)     Glucose, UA >1000 (*)     Bilirubin Urine SMALL (*)     Protein, ur 30 (*)     All other components within normal limits  URINE MICROSCOPIC-ADD ON - Abnormal; Notable for the following:    Squamous Epithelial / LPF FEW (*)     All other components within normal limits  GLUCOSE, CAPILLARY - Abnormal; Notable for the following:    Glucose-Capillary 391 (*)     All other components within normal limits  GLUCOSE, CAPILLARY - Abnormal; Notable for the following:    Glucose-Capillary 365 (*)     All other components within normal limits  GLUCOSE, CAPILLARY - Abnormal; Notable for the following:    Glucose-Capillary 367 (*)     All other components within normal limits  LAB REPORT - SCANNED   No results found.   1. Hyperglycemia   2. Noncompliance with medications       MDM  Hyperglycemia, secondary to medication noncompliance. Stabilization to be done in the CDU.          Flint Melter, MD 10/20/11 1648  Flint Melter, MD 10/20/11 [phone]

## 2011-10-19 NOTE — ED Notes (Signed)
Glucose 391

## 2011-10-19 NOTE — ED Notes (Signed)
Pt daughter has called 2 times about pt plan of care. She also has verbalized concern about pt getting her insulin. "i dont know if my mom is coherant enough to tell you why she is at the hospital". Patient is very alert and oriented and capable of verbalizing her needs and concerns. Case management is also involved in pt care.

## 2011-10-19 NOTE — ED Notes (Signed)
resp therapy aware of pt neb

## 2011-10-19 NOTE — ED Notes (Signed)
CBG 491 

## 2011-10-19 NOTE — ED Notes (Signed)
Pt out of insulin for a month, body aches, dizzy, blurred vision

## 2011-10-19 NOTE — ED Notes (Signed)
GLUCOSE 365

## 2011-10-19 NOTE — ED Notes (Signed)
Patient states got her medicaid on Monday. States she got her prescriptions filled but some unable to pick up due to cost. States even with medicaid she had 70 dollar out of pocket expense. States also that even if she had gotten her insulin (novolog) she wouldn't know how much to take due to the fact she has no way to test her glucose. She also takes metformin. Case manager to consult to help problem solve for pt

## 2011-10-19 NOTE — ED Provider Notes (Signed)
Assumed pt care in CDU.  Pt with hx of COPD and DM presents for management of her hyperglycemia.  Pt has been out of her usual diabetic and COPD medications for over a month due to not being able to afford it.  Pt was seen and evaluated by Dr. Effie Shy.  She is to continue management to control her CBG prior to discharge.  Pt also requesting for breathing treatment here in CDU.  On exam, lung with distant breath sounds but without wheezes, rhonchi, or rales noted.  Pt speaking in complete sentences, sats at 96% on 2L.  Will give albuterol/atrovent nebs treatment   2:27 PM Pt feels better.  Is alert and oriented.  She has medicaid and will need to get her medication filled at her earliest convenient.  Case Manager has talked to her in regard to her medication filled.  We are unable to offer assistance as pt has medicaid.  Pt also received 20 unit of insulin along with IVF here.  I recommend return if her sxs worsen.  Pt agrees to be discharge at this time.    BP 114/57  Pulse 78  Temp 97.8 F (36.6 C) (Oral)  Resp 16  SpO2 96%   Fayrene Helper, PA-C 10/19/11 1429

## 2011-10-20 NOTE — ED Provider Notes (Signed)
Medical screening examination/treatment/procedure(s) were conducted as a shared visit with non-physician practitioner(s) and myself.  I personally evaluated the patient during the encounter  Flint Melter, MD 10/20/11 [phone]

## 2011-10-22 LAB — GLUCOSE, CAPILLARY: Glucose-Capillary: 491 mg/dL — ABNORMAL HIGH (ref 70–99)

## 2012-02-29 ENCOUNTER — Inpatient Hospital Stay (HOSPITAL_COMMUNITY)
Admission: EM | Admit: 2012-02-29 | Discharge: 2012-03-12 | DRG: 004 | Disposition: A | Discharge status: Other Acute Inpatient Hospital | Payer: Medicaid Other | Attending: Pulmonary Disease | Admitting: Pulmonary Disease

## 2012-02-29 ENCOUNTER — Encounter (HOSPITAL_COMMUNITY): Payer: Self-pay | Admitting: *Deleted

## 2012-02-29 ENCOUNTER — Emergency Department (HOSPITAL_COMMUNITY): Payer: Medicaid Other

## 2012-02-29 DIAGNOSIS — J90 Pleural effusion, not elsewhere classified: Secondary | ICD-10-CM | POA: Diagnosis present

## 2012-02-29 DIAGNOSIS — J449 Chronic obstructive pulmonary disease, unspecified: Secondary | ICD-10-CM

## 2012-02-29 DIAGNOSIS — J80 Acute respiratory distress syndrome: Secondary | ICD-10-CM

## 2012-02-29 DIAGNOSIS — E875 Hyperkalemia: Secondary | ICD-10-CM | POA: Diagnosis not present

## 2012-02-29 DIAGNOSIS — E1165 Type 2 diabetes mellitus with hyperglycemia: Secondary | ICD-10-CM | POA: Diagnosis present

## 2012-02-29 DIAGNOSIS — R0600 Dyspnea, unspecified: Secondary | ICD-10-CM

## 2012-02-29 DIAGNOSIS — E876 Hypokalemia: Secondary | ICD-10-CM | POA: Diagnosis not present

## 2012-02-29 DIAGNOSIS — E785 Hyperlipidemia, unspecified: Secondary | ICD-10-CM | POA: Diagnosis present

## 2012-02-29 DIAGNOSIS — Y95 Nosocomial condition: Secondary | ICD-10-CM | POA: Diagnosis not present

## 2012-02-29 DIAGNOSIS — Z7982 Long term (current) use of aspirin: Secondary | ICD-10-CM

## 2012-02-29 DIAGNOSIS — J811 Chronic pulmonary edema: Secondary | ICD-10-CM | POA: Diagnosis present

## 2012-02-29 DIAGNOSIS — J441 Chronic obstructive pulmonary disease with (acute) exacerbation: Secondary | ICD-10-CM

## 2012-02-29 DIAGNOSIS — E662 Morbid (severe) obesity with alveolar hypoventilation: Secondary | ICD-10-CM | POA: Diagnosis present

## 2012-02-29 DIAGNOSIS — I2510 Atherosclerotic heart disease of native coronary artery without angina pectoris: Secondary | ICD-10-CM | POA: Diagnosis present

## 2012-02-29 DIAGNOSIS — I10 Essential (primary) hypertension: Secondary | ICD-10-CM | POA: Diagnosis present

## 2012-02-29 DIAGNOSIS — Z794 Long term (current) use of insulin: Secondary | ICD-10-CM

## 2012-02-29 DIAGNOSIS — K560 Paralytic ileus: Secondary | ICD-10-CM | POA: Diagnosis not present

## 2012-02-29 DIAGNOSIS — J1000 Influenza due to other identified influenza virus with unspecified type of pneumonia: Secondary | ICD-10-CM | POA: Diagnosis present

## 2012-02-29 DIAGNOSIS — G9349 Other encephalopathy: Secondary | ICD-10-CM | POA: Diagnosis present

## 2012-02-29 DIAGNOSIS — E874 Mixed disorder of acid-base balance: Secondary | ICD-10-CM | POA: Diagnosis present

## 2012-02-29 DIAGNOSIS — D696 Thrombocytopenia, unspecified: Secondary | ICD-10-CM | POA: Diagnosis present

## 2012-02-29 DIAGNOSIS — I959 Hypotension, unspecified: Secondary | ICD-10-CM

## 2012-02-29 DIAGNOSIS — K219 Gastro-esophageal reflux disease without esophagitis: Secondary | ICD-10-CM | POA: Diagnosis present

## 2012-02-29 DIAGNOSIS — G4733 Obstructive sleep apnea (adult) (pediatric): Secondary | ICD-10-CM | POA: Diagnosis present

## 2012-02-29 DIAGNOSIS — F17200 Nicotine dependence, unspecified, uncomplicated: Secondary | ICD-10-CM | POA: Diagnosis present

## 2012-02-29 DIAGNOSIS — B953 Streptococcus pneumoniae as the cause of diseases classified elsewhere: Secondary | ICD-10-CM | POA: Diagnosis present

## 2012-02-29 DIAGNOSIS — E119 Type 2 diabetes mellitus without complications: Secondary | ICD-10-CM

## 2012-02-29 DIAGNOSIS — J209 Acute bronchitis, unspecified: Secondary | ICD-10-CM

## 2012-02-29 DIAGNOSIS — Z720 Tobacco use: Secondary | ICD-10-CM | POA: Diagnosis present

## 2012-02-29 DIAGNOSIS — J9600 Acute respiratory failure, unspecified whether with hypoxia or hypercapnia: Secondary | ICD-10-CM

## 2012-02-29 DIAGNOSIS — Z9981 Dependence on supplemental oxygen: Secondary | ICD-10-CM

## 2012-02-29 DIAGNOSIS — Z79899 Other long term (current) drug therapy: Secondary | ICD-10-CM

## 2012-02-29 DIAGNOSIS — E46 Unspecified protein-calorie malnutrition: Secondary | ICD-10-CM | POA: Diagnosis present

## 2012-02-29 DIAGNOSIS — R000 Tachycardia, unspecified: Secondary | ICD-10-CM | POA: Diagnosis present

## 2012-02-29 DIAGNOSIS — J111 Influenza due to unidentified influenza virus with other respiratory manifestations: Secondary | ICD-10-CM

## 2012-02-29 DIAGNOSIS — J40 Bronchitis, not specified as acute or chronic: Secondary | ICD-10-CM

## 2012-02-29 DIAGNOSIS — Z6839 Body mass index (BMI) 39.0-39.9, adult: Secondary | ICD-10-CM

## 2012-02-29 DIAGNOSIS — G8929 Other chronic pain: Secondary | ICD-10-CM

## 2012-02-29 DIAGNOSIS — K567 Ileus, unspecified: Secondary | ICD-10-CM | POA: Diagnosis not present

## 2012-02-29 DIAGNOSIS — R001 Bradycardia, unspecified: Secondary | ICD-10-CM

## 2012-02-29 DIAGNOSIS — I498 Other specified cardiac arrhythmias: Secondary | ICD-10-CM | POA: Diagnosis not present

## 2012-02-29 DIAGNOSIS — E1169 Type 2 diabetes mellitus with other specified complication: Secondary | ICD-10-CM | POA: Diagnosis present

## 2012-02-29 DIAGNOSIS — J440 Chronic obstructive pulmonary disease with acute lower respiratory infection: Secondary | ICD-10-CM | POA: Diagnosis present

## 2012-02-29 DIAGNOSIS — J101 Influenza due to other identified influenza virus with other respiratory manifestations: Secondary | ICD-10-CM | POA: Diagnosis present

## 2012-02-29 DIAGNOSIS — E669 Obesity, unspecified: Secondary | ICD-10-CM

## 2012-02-29 DIAGNOSIS — J0410 Acute tracheitis without obstruction: Secondary | ICD-10-CM | POA: Diagnosis not present

## 2012-02-29 DIAGNOSIS — R0902 Hypoxemia: Secondary | ICD-10-CM | POA: Diagnosis present

## 2012-02-29 DIAGNOSIS — J9620 Acute and chronic respiratory failure, unspecified whether with hypoxia or hypercapnia: Principal | ICD-10-CM

## 2012-02-29 HISTORY — DX: Type 2 diabetes mellitus with other specified complication: E66.9

## 2012-02-29 HISTORY — DX: Type 2 diabetes mellitus with other specified complication: E11.69

## 2012-02-29 HISTORY — DX: Essential (primary) hypertension: I10

## 2012-02-29 LAB — BASIC METABOLIC PANEL
BUN: 28 mg/dL — ABNORMAL HIGH (ref 6–23)
CO2: 27 mEq/L (ref 19–32)
Calcium: 8.6 mg/dL (ref 8.4–10.5)
Chloride: 90 mEq/L — ABNORMAL LOW (ref 96–112)
Creatinine, Ser: 1.1 mg/dL (ref 0.50–1.10)
GFR calc Af Amer: 63 mL/min — ABNORMAL LOW (ref >90)
GFR calc non Af Amer: 55 mL/min — ABNORMAL LOW (ref >90)
Glucose, Bld: 294 mg/dL — ABNORMAL HIGH (ref 70–99)
Potassium: 4.6 mEq/L (ref 3.5–5.1)
Sodium: 131 mEq/L — ABNORMAL LOW (ref 135–145)

## 2012-02-29 LAB — GLUCOSE, CAPILLARY
Glucose-Capillary: 414 mg/dL — ABNORMAL HIGH (ref 70–99)
Glucose-Capillary: 491 mg/dL — ABNORMAL HIGH (ref 70–99)

## 2012-02-29 LAB — POCT I-STAT 3, ART BLOOD GAS (G3+)
Bicarbonate: 29.1 mEq/L — ABNORMAL HIGH (ref 20.0–24.0)
O2 Saturation: 94 %
Patient temperature: 99.4
TCO2: 31 mmol/L (ref 0–100)
pCO2 arterial: 64.1 mmHg (ref 35.0–45.0)
pH, Arterial: 7.268 — ABNORMAL LOW (ref 7.350–7.450)
pO2, Arterial: 86 mmHg (ref 80.0–100.0)

## 2012-02-29 LAB — HEMOGLOBIN A1C
Hgb A1c MFr Bld: 6.4 % — ABNORMAL HIGH (ref <5.7)
Mean Plasma Glucose: 137 mg/dL — ABNORMAL HIGH (ref <117)

## 2012-02-29 LAB — PROCALCITONIN: Procalcitonin: 0.33 ng/mL

## 2012-02-29 LAB — CBC
HCT: 43 % (ref 36.0–46.0)
Hemoglobin: 13.4 g/dL (ref 12.0–15.0)
MCH: 29.5 pg (ref 26.0–34.0)
MCHC: 31.2 g/dL (ref 30.0–36.0)
MCV: 94.5 fL (ref 78.0–100.0)
Platelets: 185 10*3/uL (ref 150–400)
RBC: 4.55 MIL/uL (ref 3.87–5.11)
RDW: 14 % (ref 11.5–15.5)
WBC: 7.4 10*3/uL (ref 4.0–10.5)

## 2012-02-29 LAB — PRO B NATRIURETIC PEPTIDE: Pro B Natriuretic peptide (BNP): 1012 pg/mL — ABNORMAL HIGH (ref 0–125)

## 2012-02-29 LAB — TROPONIN I: Troponin I: <0.3 ng/mL (ref <0.30)

## 2012-02-29 LAB — INFLUENZA PANEL BY PCR (TYPE A & B)
Influenza A By PCR: POSITIVE — AB
Influenza B By PCR: NEGATIVE

## 2012-02-29 MED ORDER — PANTOPRAZOLE SODIUM 40 MG PO TBEC
40.0000 mg | DELAYED_RELEASE_TABLET | Freq: Every day | ORAL | Status: DC
  Administered 2012-03-01: 40 mg via ORAL
  Filled 2012-02-29: qty 1

## 2012-02-29 MED ORDER — LEVALBUTEROL HCL 0.63 MG/3ML IN NEBU
0.6300 mg | INHALATION_SOLUTION | RESPIRATORY_TRACT | Status: DC | PRN
  Filled 2012-02-29: qty 3

## 2012-02-29 MED ORDER — ALBUTEROL SULFATE (5 MG/ML) 0.5% IN NEBU
5.0000 mg | INHALATION_SOLUTION | Freq: Once | RESPIRATORY_TRACT | Status: AC
  Administered 2012-02-29: 5 mg via RESPIRATORY_TRACT
  Filled 2012-02-29: qty 40

## 2012-02-29 MED ORDER — OSELTAMIVIR PHOSPHATE 75 MG PO CAPS
75.0000 mg | ORAL_CAPSULE | Freq: Two times a day (BID) | ORAL | Status: DC
  Administered 2012-02-29 – 2012-03-04 (×7): 75 mg via ORAL
  Filled 2012-02-29 (×9): qty 1

## 2012-02-29 MED ORDER — METHYLPREDNISOLONE SODIUM SUCC 125 MG IJ SOLR
125.0000 mg | Freq: Three times a day (TID) | INTRAMUSCULAR | Status: DC
  Administered 2012-02-29 – 2012-03-01 (×3): 125 mg via INTRAVENOUS
  Filled 2012-02-29 (×6): qty 2

## 2012-02-29 MED ORDER — ALPRAZOLAM 0.5 MG PO TABS
0.5000 mg | ORAL_TABLET | Freq: Three times a day (TID) | ORAL | Status: DC | PRN
  Administered 2012-02-29 – 2012-03-04 (×7): 0.5 mg via ORAL
  Filled 2012-02-29 (×7): qty 1

## 2012-02-29 MED ORDER — SODIUM CHLORIDE 0.9 % IV BOLUS (SEPSIS)
500.0000 mL | Freq: Once | INTRAVENOUS | Status: AC
  Administered 2012-02-29: 500 mL via INTRAVENOUS

## 2012-02-29 MED ORDER — IPRATROPIUM BROMIDE 0.02 % IN SOLN
0.5000 mg | Freq: Four times a day (QID) | RESPIRATORY_TRACT | Status: DC
  Administered 2012-02-29 – 2012-03-05 (×19): 0.5 mg via RESPIRATORY_TRACT
  Filled 2012-02-29 (×17): qty 2.5

## 2012-02-29 MED ORDER — ALBUTEROL (5 MG/ML) CONTINUOUS INHALATION SOLN
INHALATION_SOLUTION | RESPIRATORY_TRACT | Status: AC
  Filled 2012-02-29: qty 120

## 2012-02-29 MED ORDER — IPRATROPIUM BROMIDE 0.02 % IN SOLN
0.5000 mg | Freq: Once | RESPIRATORY_TRACT | Status: AC
  Administered 2012-02-29: 0.5 mg via RESPIRATORY_TRACT
  Filled 2012-02-29: qty 2.5

## 2012-02-29 MED ORDER — INSULIN ASPART 100 UNIT/ML ~~LOC~~ SOLN: 0.0000 [IU] | Freq: Three times a day (TID) | SUBCUTANEOUS | Status: DC

## 2012-02-29 MED ORDER — GUAIFENESIN ER 600 MG PO TB12
1200.0000 mg | ORAL_TABLET | Freq: Two times a day (BID) | ORAL | Status: DC
  Administered 2012-02-29 – 2012-03-02 (×6): 1200 mg via ORAL
  Filled 2012-02-29 (×9): qty 2

## 2012-02-29 MED ORDER — LEVOFLOXACIN IN D5W 500 MG/100ML IV SOLN
500.0000 mg | INTRAVENOUS | Status: DC
  Administered 2012-02-29 – 2012-03-02 (×3): 500 mg via INTRAVENOUS
  Filled 2012-02-29 (×4): qty 100

## 2012-02-29 MED ORDER — IPRATROPIUM BROMIDE 0.02 % IN SOLN
0.5000 mg | RESPIRATORY_TRACT | Status: DC | PRN
  Filled 2012-02-29 (×2): qty 2.5

## 2012-02-29 MED ORDER — LEVALBUTEROL HCL 0.63 MG/3ML IN NEBU
0.6300 mg | INHALATION_SOLUTION | Freq: Four times a day (QID) | RESPIRATORY_TRACT | Status: DC
  Administered 2012-02-29 – 2012-03-02 (×7): 0.63 mg via RESPIRATORY_TRACT
  Filled 2012-02-29 (×12): qty 3

## 2012-02-29 MED ORDER — SODIUM CHLORIDE 0.9 % IV BOLUS (SEPSIS)
750.0000 mL | Freq: Once | INTRAVENOUS | Status: AC
  Administered 2012-02-29: 17:00:00 via INTRAVENOUS

## 2012-02-29 MED ORDER — ALBUTEROL SULFATE (5 MG/ML) 0.5% IN NEBU
15.0000 mg | INHALATION_SOLUTION | Freq: Once | RESPIRATORY_TRACT | Status: AC
  Administered 2012-02-29: 15 mg via RESPIRATORY_TRACT
  Filled 2012-02-29: qty 3

## 2012-02-29 MED ORDER — OSELTAMIVIR PHOSPHATE 75 MG PO CAPS: 75.0000 mg | ORAL_CAPSULE | Freq: Two times a day (BID) | ORAL | Status: DC

## 2012-02-29 MED ORDER — SODIUM CHLORIDE 0.9 % IV SOLN
INTRAVENOUS | Status: DC
  Administered 2012-02-29: 7.9 [IU]/h via INTRAVENOUS
  Administered 2012-02-29: 10.9 [IU]/h via INTRAVENOUS
  Filled 2012-02-29 (×3): qty 1

## 2012-02-29 MED ORDER — SODIUM CHLORIDE 0.9 % IV SOLN
INTRAVENOUS | Status: DC
  Administered 2012-02-29 – 2012-03-01 (×3): via INTRAVENOUS

## 2012-02-29 NOTE — ED Notes (Signed)
Per ems- pt has had increasing SOB since last night. Pt has done approx 6 duo nebs since last night. Pt is normally on 2L at home. Pt on 6L upon arrival. Pt O2 sats at 90-92%. bp 113/80. HR 120. Solumedrol given en route. Hx of COPD and Asthma.

## 2012-02-29 NOTE — ED Notes (Signed)
Sitting on side of bed, O2 on at 6L/M/[name] -- sats = 82%, assisted back into bed, monitor on, STach

## 2012-02-29 NOTE — ED Notes (Signed)
Critical care at bedside  

## 2012-02-29 NOTE — H&P (Signed)
PULMONARY  / CRITICAL CARE MEDICINE  Name: LOUIZA MOOR MRN: [id number] DOB: 1955/05/16    LOS: 0  REFERRING MD :  Janee Morn   CHIEF COMPLAINT:  AECOPD and hypotension   BRIEF PATIENT DESCRIPTION:  56 year old female w/ chronic oxygen dependent resp failure on basis of COPD (still actively smokes). Presents 12/27 w/ Acute on chronic respiratory failure on basis of AECOPD and mild hypotension. PCCM asked to eval.   LINES / TUBES:   CULTURES: Influenza pcr 12/27>>> u strep 12/27>>> u legionella 12/27>>> Urine culture 12/27>>> Sputum 12/27>>>  ANTIBIOTICS: levaquin 12/27>>>  SIGNIFICANT EVENTS:    LEVEL OF CARE:  ICU PRIMARY SERVICE:  PCCM  CONSULTANTS:   CODE STATUS: full code  DIET:   DVT Px:  [place] heparin  GI Px:  PPI   HISTORY OF PRESENT ILLNESS:    56 year old female w/ chronic oxygen dependent resp failure on basis of COPD (still actively smokes). Reports at baseline can walk about 150 feet before onset of dyspnea. Has had progressive cough worse than baseline over the 2 weeks prior to admit. This has been more frequent and course, but not productive. She reports wheeze to be worse over last 3-4 days, had been using her SABA, but over last 48 hours no improvement. Denies CP, did have fever, and chills on 12/26. Family also noted that her oxygen tubing was tied in a knot at home so this likely made things worse.  Presents 12/27 w/ Acute on chronic respiratory failure on basis of AECOPD and mild hypotension. Patient does endorse some loose stools.Patient stated that his symptoms worsened to the point that her breathing was labored and she thought she was going to die and a such called EMS and was brought to the ED. Patient was given some IV Solu-Medrol on route and placed on nebulizer treatments. In the ED patient is currently on 6 L nasal cannula with some improvement in her shortness of breath since presentation. Patient using the accessory muscles of respiration  PCCM  asked to eval.   PAST MEDICAL HISTORY :  Past Medical History  Diagnosis Date  . COPD (chronic obstructive pulmonary disease)   . Respiratory failure   . Morbid obesity   . Tobacco abuse   . GERD (gastroesophageal reflux disease)   . Hyperlipidemia   . Chronic pain   . Hypertension   . Heart attack   . Arthritis   . Allergic rhinitis   . Diabetes mellitus, type 2   . Diabetes mellitus type 2 in obese 02/29/2012  . HTN (hypertension) 02/29/2012   Past Surgical History  Procedure Date  . Cholecystectomy   . Partial hysterectomy   . Angioplasty   . Umbilical hernia repair 12/2009   Prior to Admission medications   Medication Sig Start Date End Date Taking? Authorizing Provider  albuterol (VENTOLIN HFA) 108 (90 BASE) MCG/ACT inhaler Inhale 2 puffs into the lungs every 6 (six) hours as needed.    Yes Historical Provider, MD  budesonide-formoterol (SYMBICORT) 160-4.5 MCG/ACT inhaler Inhale 2 puffs into the lungs 2 (two) times daily.     Yes Historical Provider, MD  cyclobenzaprine (FLEXERIL) 10 MG tablet Take 10 mg by mouth 3 (three) times daily.    Yes Historical Provider, MD  Dextromethorphan Polistirex (DELSYM PO) Take 5 mLs by mouth 2 (two) times daily as needed. For cough   Yes Historical Provider, MD  furosemide (LASIX) 40 MG tablet Take 40 mg by mouth 2 (two) times  daily.    Yes Historical Provider, MD  Hydrocodone-Acetaminophen 5-300 MG TABS Take 1 tablet by mouth every 4 (four) hours as needed. For pain   Yes Historical Provider, MD  insulin NPH-insulin regular (NOVOLIN 70/30) (70-30) 100 UNIT/ML injection Inject 70 Units into the skin 3 (three) times daily as needed. For BG > 200   Yes Historical Provider, MD  isosorbide mononitrate (IMDUR) 30 MG 24 hr tablet Take 30 mg by mouth daily.     Yes Historical Provider, MD  metFORMIN (GLUCOPHAGE) 1000 MG tablet Take 1,000 mg by mouth 2 (two) times daily with a meal.     Yes Historical Provider, MD  metoprolol (TOPROL-XL) 50 MG 24  hr tablet Take 50 mg by mouth daily.     Yes Historical Provider, MD  olmesartan (BENICAR) 40 MG tablet Take 40 mg by mouth daily.   Yes Historical Provider, MD  potassium chloride 20 MEQ/15ML (10%) solution Take 20 mEq by mouth 2 (two) times daily.   Yes Historical Provider, MD  rOPINIRole (REQUIP) 2 MG tablet Take 2 mg by mouth at bedtime.   Yes Historical Provider, MD  aspirin 81 MG tablet Take 81 mg by mouth 2 (two) times daily.      Historical Provider, MD   No Known Allergies  FAMILY HISTORY:  No family history on file. SOCIAL HISTORY:  reports that she has quit smoking. Her smoking use included Cigarettes. She has a 40 pack-year smoking history. She has never used smokeless tobacco. She reports that she does not drink alcohol. Her drug history not on file.  REVIEW OF SYSTEMS:   Review of Systems  Constitutional: No weight loss, gain, night sweats, + Fevers, + chills,+  fatigue .  HEENT: No headaches, visual changes, Difficulty swallowing, Tooth/dental problems, or Sore throat,  No sneezing, itching, ear ache, nasal congestion, post nasal drip, no visual complaints CV: No chest pain, Orthopnea, PND, swelling in lower extremities, dizziness, palpitations, syncope.  GI No heartburn, indigestion, abdominal pain, nausea, vomiting, +loose stools , change in bowel habits, loss of appetite, bloody stools.  Resp: + cough,non-productive, worse over last 2 weeks, + wheeze last 3-4d, less responsive to SABA. No coughing up of blood.  Skin: no rash or itching or icterus GU: no dysuria, change in color of urine, no urgency or frequency. No flank pain, no hematuria  MS: No joint pain or swelling. No decreased range of motion  Psych: No change in mood or affect. No depression or anxiety.  Neuro: no difficulty with speech, weakness, numbness, ataxia    INTERVAL HISTORY:  Feels a little better  VITAL SIGNS: Temp:  [99.4 F (37.4 C)-99.5 F (37.5 C)] 99.5 F (37.5 C) (12/27 1046) Pulse Rate:   [105-127] 105  (12/27 1500) Resp:  [16-28] 21  (12/27 1500) BP: (95-140)/(50-111) 96/50 mmHg (12/27 1500) SpO2:  [84 %-94 %] 94 % (12/27 1500) HEMODYNAMICS:   VENTILATOR SETTINGS:   INTAKE / OUTPUT: Intake/Output    None     PHYSICAL EXAMINATION: General:  Chronically ill appearing obese white female, sitting up in bed. Still w/ some labored resp efforts  Neuro:  Awake, no focal def  HEENT:  OP slightly erythremic  Cardiovascular:  rrr Lungs:  Prolonged expiratory wheeze  Abdomen:  Obese, + bowel sounds, no OM  Musculoskeletal:  Intact  Skin:  No edema, 2+ pulse    LABS: Cbc  Lab 02/29/12 1119  WBC 7.4  HGB 13.4  HCT 43.0  PLT 185  Chemistry   Lab 02/29/12 1119  NA 131*  K 4.6  CL 90*  CO2 27  BUN 28*  CREATININE 1.10  CALCIUM 8.6  MG --  PHOS --  GLUCOSE 294*    Liver fxn No results found for this basename: AST:3,ALT:3,ALKPHOS:3,BILITOT:3,PROT:3,ALBUMIN:3 in the last 168 hours coags No results found for this basename: APTT:3,INR:3 in the last 168 hours Sepsis markers No results found for this basename: LATICACIDVEN:3,PROCALCITON:3 in the last 168 hours Cardiac markers  Lab 02/29/12 1119  CKTOTAL --  CKMB --  TROPONINI <0.30   BNP  Lab 02/29/12 1119  PROBNP 1012.0*   ABG  Lab 02/29/12 1507  PHART 7.268*  PCO2ART 64.1*  PO2ART 86.0  HCO3 29.1*  TCO2 31    CBG trend No results found for this basename: GLUCAP:5 in the last 168 hours  IMAGING: CM, no clear infiltrate  ECG:  DIAGNOSES: Principal Problem:  *Acute-on-chronic respiratory failure Active Problems:  Tobacco abuse  Obesity  Hypotension  Tachycardia  COPD exacerbation  GERD (gastroesophageal reflux disease)  Hyperlipidemia  Diabetes mellitus type 2 in obese  HTN (hypertension)  Chronic pain   ASSESSMENT / PLAN:  PULMONARY  ASSESSMENT: Acute on Chronic respiratory failure in setting of AECOPD w/ acute bronchitis.  Tobacco abuse.  Has improved  significantly w/ IV steroids and BDs, but gas exchange is poor.   PLAN:   Admit to ICU NIPPV BDs Wean FIO2 Systemic steroids Empiric antibiotics.   CARDIOVASCULAR  ASSESSMENT:  Hypotension/ clinically no evidence of shock: ddx hypovolemia (favor), air trapping and concomitant volume depletion +/- sepsis, does meet SIRS criteria.   PLAN:  Admit to ICU F/u lactate Fluid challenge  Hold antihypertensives See ID section   RENAL ASSESSMENT:   Mild hyponatremia PLAN:   NS replacement F/u chemistry   GASTROINTESTINAL  ASSESSMENT:   No acute issue  PLAN:   Advance diet as tolerated PPI as will place on NIPPV  HEMATOLOGIC  ASSESSMENT:   No acute issue, although current hgb may reflect hemoconcentration.  PLAN:  F/u cbc [place] heparin   INFECTIOUS  ASSESSMENT:   Acute bronchitis PLAN:   Influenza screen Sputum culture Urine strep and legionella antigen   ENDOCRINE  ASSESSMENT:   DM   PLAN:   ssi -->insulin gtt  NEUROLOGIC  ASSESSMENT:   No acute issue PLAN:   Supportive care   CLINICAL SUMMARY:  56 year old female w/ chronic oxygen dependent resp failure on basis of COPD (still actively smokes). Presents 12/27 w/ Acute on chronic respiratory failure on basis of AECOPD and mild hypotension which may be either be sepsis or simply volume depletion. Will admit her to ICU. Place her on NIPPV, give fluid challenge, empiric antibiotics, scheduled Bds, systemic steroids, and close observation.   I have personally obtained a history, examined the patient, evaluated laboratory and imaging results, formulated the assessment and plan and placed orders. CRITICAL CARE: The patient is critically ill with multiple organ systems failure and requires high complexity decision making for assessment and support, frequent evaluation and titration of therapies, application of advanced monitoring technologies and extensive interpretation of multiple databases. Critical Care Time  devoted to patient care services described in this note is 45 minutes.   Alyson Reedy, M.D. [facility] Pulmonary/Critical Care Medicine. Pager: [phone]. After hours pager: [phone].

## 2012-02-29 NOTE — ED Notes (Signed)
Velvet pt daughter- [phone]

## 2012-02-29 NOTE — ED Notes (Signed)
Assisted patient back to bed from bathroom. Pt was in hallway with daughter and was requesting assistance.

## 2012-02-29 NOTE — Consult Note (Addendum)
[facility] History and Physical  [name] ZOX:[id number] DOB: 23-Apr-1955 DOA: 02/29/2012  Referring physician: Dr Juleen China PCP: [name], NP  Specialists: Pulmonary and critical care medicine: Dr. Molli Knock  Chief Complaint: Shortness of breath  HPI: [name] is a 56 y.o. female with history of COPD on chronic home O2 at 3 L per minute, ongoing tobacco abuse, morbid obesity, gastroesophageal reflux disease, hypertension, type 2 diabetes, who presents to the ED with a 2 day history of worsening shortness of breath. Patient states that the oxygen had been clamped off for the past 3 days and should have no idea about it. Patient stated that 2 days prior to admission developed severe worsening shortness of breath and some associated wheezing subjective fevers chills, nonproductive cough, some chest discomfort secondary to excessive coughing, generalized weakness, and a feeling that the blood in the heart was surgeon. Patient denies any constipation no ongoing chest pain, no nausea, no vomiting, no constipation. Patient does endorse some loose stools. Patient denies any sore throat, no rhinorrhea. Patient stated that she tried a dual nebs at home with some improvement. Patient stated that his symptoms worsened to the point that her breathing was labored and she thought she was going to die and a such called EMS and was brought to the ED. Patient was given some IV Solu-Medrol on route and placed on nebulizer treatments. In the ED patient is currently on 6 L nasal cannula with some improvement in her shortness of breath since presentation. Patient using the accessory muscles of respiration. Labs obtained in the ED included a basic metabolic profile which have a sodium of 131 chloride of 90 even of 28 glucose of 294 otherwise was within normal limits. Troponin was less than 0.30. Pro BNP was 1012. CBC obtained was within normal limits. Chest x-ray obtained was negative for any acute  infiltrate. Patient was given some nebulizer treatments. Will call to admit the patient for further evaluation and management.   Review of Systems: The patient denies anorexia, fever, weight loss,, vision loss, decreased hearing, hoarseness, chest pain, syncope, dyspnea on exertion, peripheral edema, balance deficits, hemoptysis, abdominal pain, melena, hematochezia, severe indigestion/heartburn, hematuria, incontinence, genital sores, muscle weakness, suspicious skin lesions, transient blindness, difficulty walking, depression, unusual weight change, abnormal bleeding, enlarged lymph nodes, angioedema, and breast masses.    Past Medical History  Diagnosis Date  . COPD (chronic obstructive pulmonary disease)   . Respiratory failure   . Morbid obesity   . Tobacco abuse   . GERD (gastroesophageal reflux disease)   . Hyperlipidemia   . Chronic pain   . Hypertension   . Heart attack   . Arthritis   . Allergic rhinitis   . Diabetes mellitus, type 2   . Diabetes mellitus type 2 in obese 02/29/2012  . HTN (hypertension) 02/29/2012   Past Surgical History  Procedure Date  . Cholecystectomy   . Partial hysterectomy   . Angioplasty   . Umbilical hernia repair 12/2009   Social History:  reports that she has quit smoking. Her smoking use included Cigarettes. She has a 40 pack-year smoking history. She has never used smokeless tobacco. She reports that she does not drink alcohol. Her drug history not on file.  No Known Allergies  No family history on file.   Prior to Admission medications   Medication Sig Start Date End Date Taking? Authorizing Provider  albuterol (VENTOLIN HFA) 108 (90 BASE) MCG/ACT inhaler Inhale 2 puffs into the lungs every 6 (six) hours  as needed.    Yes Historical Provider, MD  budesonide-formoterol (SYMBICORT) 160-4.5 MCG/ACT inhaler Inhale 2 puffs into the lungs 2 (two) times daily.     Yes Historical Provider, MD  cyclobenzaprine (FLEXERIL) 10 MG tablet Take 10 mg  by mouth 3 (three) times daily.    Yes Historical Provider, MD  Dextromethorphan Polistirex (DELSYM PO) Take 5 mLs by mouth 2 (two) times daily as needed. For cough   Yes Historical Provider, MD  furosemide (LASIX) 40 MG tablet Take 40 mg by mouth 2 (two) times daily.    Yes Historical Provider, MD  Hydrocodone-Acetaminophen 5-300 MG TABS Take 1 tablet by mouth every 4 (four) hours as needed. For pain   Yes Historical Provider, MD  insulin NPH-insulin regular (NOVOLIN 70/30) (70-30) 100 UNIT/ML injection Inject 70 Units into the skin 3 (three) times daily as needed. For BG > 200   Yes Historical Provider, MD  isosorbide mononitrate (IMDUR) 30 MG 24 hr tablet Take 30 mg by mouth daily.     Yes Historical Provider, MD  metFORMIN (GLUCOPHAGE) 1000 MG tablet Take 1,000 mg by mouth 2 (two) times daily with a meal.     Yes Historical Provider, MD  metoprolol (TOPROL-XL) 50 MG 24 hr tablet Take 50 mg by mouth daily.     Yes Historical Provider, MD  olmesartan (BENICAR) 40 MG tablet Take 40 mg by mouth daily.   Yes Historical Provider, MD  potassium chloride 20 MEQ/15ML (10%) solution Take 20 mEq by mouth 2 (two) times daily.   Yes Historical Provider, MD  rOPINIRole (REQUIP) 2 MG tablet Take 2 mg by mouth at bedtime.   Yes Historical Provider, MD  aspirin 81 MG tablet Take 81 mg by mouth 2 (two) times daily.      Historical Provider, MD   Physical Exam: Filed Vitals:   02/29/12 1215 02/29/12 1259 02/29/12 1312 02/29/12 1415  BP:   140/111 95/51  Pulse: 109  115 105  Temp:      TempSrc:      Resp: 19  16 18   SpO2: 88% 91% 88% 94%     General:  Laying on a gurney using accessory muscles of respiration however speech is somewhat full sentences on 6 L of oxygen.  Eyes: Pupils equal round and reactive to light and accommodation. Extraocular movements intact.  ENT: Oropharynx is clear, no lesions, no exudates.  Neck: Supple, obese, no lymphadenopathy.  Cardiovascular: Tachycardic no murmurs rubs  or gallops. Trace bilateral lower extremity edema.  Respiratory: Use of accessory muscles of respiration. Tight. Minimal expiratory wheezing. Poor air movement. No crackles. No rhonchi.  Abdomen: Obese, soft, nontender, nondistended, positive bowel sounds.  Skin:  No rashes or lesions.  Musculoskeletal: 5 out of 5 bilateral pressure management. Follow 5 bilateral lower extremity strength.  Psychiatric: Normal mood. Normal affect. Good insight. Good judgment.  Neurologic: Alert and oriented x3. Cranial nerves II through XII are grossly intact. No focal deficits.  Labs on Admission:  Basic Metabolic Panel:  Lab 02/29/12 4540  NA 131*  K 4.6  CL 90*  CO2 27  GLUCOSE 294*  BUN 28*  CREATININE 1.10  CALCIUM 8.6  MG --  PHOS --   Liver Function Tests: No results found for this basename: AST:5,ALT:5,ALKPHOS:5,BILITOT:5,PROT:5,ALBUMIN:5 in the last 168 hours No results found for this basename: LIPASE:5,AMYLASE:5 in the last 168 hours No results found for this basename: AMMONIA:5 in the last 168 hours CBC:  Lab 02/29/12 1119  WBC 7.4  NEUTROABS --  HGB 13.4  HCT 43.0  MCV 94.5  PLT 185   Cardiac Enzymes:  Lab 02/29/12 1119  CKTOTAL --  CKMB --  CKMBINDEX --  TROPONINI <0.30    BNP (last 3 results)  Basename 02/29/12 1119  PROBNP 1012.0*   CBG: No results found for this basename: GLUCAP:5 in the last 168 hours  Radiological Exams on Admission: Dg Chest 2 View  02/29/2012  *RADIOLOGY REPORT*  Clinical Data: Dyspnea  CHEST - 2 VIEW  Comparison: 11/19/2010  Findings: Vascular clips in the upper abdomen.  Coarse interstitial opacities in the lung bases as before.  No confluent airspace infiltrate or overt edema.  No effusion.  Heart size normal for technique.  Mild aortic plaque.  Spurring in the lower thoracic spine.  IMPRESSION:  1.  Stable chronic changes.  No acute disease.   Original Report Authenticated By: D. Andria Rhein, MD     EKG:  None  Assessment/Plan Principal Problem:  *Acute-on-chronic respiratory failure Active Problems:  Tobacco abuse  Obesity  Hypotension  Tachycardia  COPD exacerbation  GERD (gastroesophageal reflux disease)  Hyperlipidemia  Diabetes mellitus type 2 in obese  HTN (hypertension)  Chronic pain   #1 acute on chronic respiratory failure Secondary to acute COPD exacerbation. Patient using accessory muscle of respiration on 6 L on the facemask. Patient with poor air movement. Patient with minimal to mild expiratory wheezing however patient received nebulizer treatments and steroids prior to presentation. Patient does have a nonproductive cough and some ongoing tobacco abuser. Check ABG. Check lactic acid and procalcitonin level. Will place on IV Solu-Medrol, IV Levaquin, scheduled nebs, oxygen, Mucinex. Will consult with pulmonary and critical care for further evaluation and management.  Addendum: I had ordered an influenza panel which has come back positive for H1N1. Will place on Tamiflu and droplet precautions.  #2 acute COPD exacerbation See problem #1.  #3 hypotension Likely secondary to hypovolemia. First set of troponin is less than 0.30. Will cycle cardiac enzymes every 6 hours x2. Will check a EKG. Will check a random cortisol level. Patient with no overt signs of bleeding. Chest x-ray is negative for any acute infiltrate. Will check a UA with cultures and sensitivities. Will give a bolus of 500 cc of normal saline x1 now and then placed on gentle hydration and hold patient's Lasix. Will check a pro calcitonin level and a lactic acid level.  #4 type 2 diabetes Hold oral hypoglycemic agents. Continue home regimen of 70/30. Sliding scale insulin.  #5 hypertension Hold blood pressure medications secondary to problem #3.  #6 gastroesophageal reflux disease PPI  #7 tachycardia Likely secondary to problem #1. Will check a UA with cultures and sensitivities. Check a lactic acid  level. Check a pro calcitonin level. We'll cycle cardiac enzymes every 6 hours x2. Check a EKG. Will change albuterol to Xopenex nebs. Will place on oxygen, nebulizer treatments, IV steroid taper, IV antibiotics. IV fluids. Follow.  #8 prophylaxis PPI for GI prophylaxis. Lovenox for DVT prophylaxis.  Code Status: Full Family Communication: updated patient and family at bedside Disposition Plan: Admit to ICU   Time spent: 75 mins  [facility] Pager [phone]  If 7PM-7AM, please contact night-coverage [email or web address] Password [email or web address] 02/29/2012, 2:52 PM

## 2012-02-29 NOTE — Progress Notes (Signed)
eLink Physician-Brief Progress Note Patient Name: [name] DOB: August 05, 1955 MRN: [id number]  Date of Service  02/29/2012   HPI/Events of Note   H1N1 positive  eICU Interventions  Start tamiflu   Intervention Category Major Interventions: Infection - evaluation and management  Shan Levans 02/29/2012, 8:18 PM

## 2012-02-29 NOTE — ED Notes (Signed)
Admitting MD at bedside.

## 2012-02-29 NOTE — ED Provider Notes (Signed)
History    56yF with cough and dyspnea. Hx of copd and baseline sob and home o2 requirement of 2l. Worsening sob since last night. Persistent. Increased cough. Denies cp. No unussal swelling. Feels tired and generally achy. No sick contacts. Nausea. No vomiting. Subjective fever.   CSN: [id number]  Arrival date & time 02/29/12  1034   First MD Initiated Contact with Patient 02/29/12 1035      Chief Complaint  Patient presents with  . Shortness of Breath    (Consider location/radiation/quality/duration/timing/severity/associated sxs/prior treatment) HPI  Past Medical History  Diagnosis Date  . COPD (chronic obstructive pulmonary disease)   . Respiratory failure   . Morbid obesity   . Tobacco abuse   . GERD (gastroesophageal reflux disease)   . Hyperlipidemia   . Chronic pain   . Hypertension   . Heart attack   . Arthritis   . Allergic rhinitis   . Diabetes mellitus, type 2     Past Surgical History  Procedure Date  . Cholecystectomy   . Partial hysterectomy   . Angioplasty   . Umbilical hernia repair 12/2009    No family history on file.  History  Substance Use Topics  . Smoking status: Former Smoker -- 1.0 packs/day for 40 years    Types: Cigarettes  . Smokeless tobacco: Never Used  . Alcohol Use: No    OB History    Grav Para Term Preterm Abortions TAB SAB Ect Mult Living                  Review of Systems  All systems reviewed and negative, other than as noted in HPI.   Allergies  Review of patient's allergies indicates no known allergies.  Home Medications   Current Outpatient Rx  Name  Route  Sig  Dispense  Refill  . ALBUTEROL SULFATE HFA 108 (90 BASE) MCG/ACT IN AERS   Inhalation   Inhale 2 puffs into the lungs every 6 (six) hours as needed.          . BUDESONIDE-FORMOTEROL FUMARATE 160-4.5 MCG/ACT IN AERO   Inhalation   Inhale 2 puffs into the lungs 2 (two) times daily.           . CYCLOBENZAPRINE HCL 10 MG PO TABS   Oral  Take 10 mg by mouth 3 (three) times daily.          . DELSYM PO   Oral   Take 5 mLs by mouth 2 (two) times daily as needed. For cough         . FUROSEMIDE 40 MG PO TABS   Oral   Take 40 mg by mouth 2 (two) times daily.          Marland Kitchen HYDROCODONE-ACETAMINOPHEN 5-300 MG PO TABS   Oral   Take 1 tablet by mouth every 4 (four) hours as needed. For pain         . INSULIN ISOPHANE & REGULAR (70-30) 100 UNIT/ML [place] SUSP   Subcutaneous   Inject 70 Units into the skin 3 (three) times daily as needed. For BG > 200         . ISOSORBIDE MONONITRATE ER 30 MG PO TB24   Oral   Take 30 mg by mouth daily.           Marland Kitchen METFORMIN HCL 1000 MG PO TABS   Oral   Take 1,000 mg by mouth 2 (two) times daily with a meal.           .  METOPROLOL SUCCINATE ER 50 MG PO TB24   Oral   Take 50 mg by mouth daily.           Marland Kitchen OLMESARTAN MEDOXOMIL 40 MG PO TABS   Oral   Take 40 mg by mouth daily.         Marland Kitchen POTASSIUM CHLORIDE 20 MEQ/15ML (10%) PO LIQD   Oral   Take 20 mEq by mouth 2 (two) times daily.         Marland Kitchen ROPINIROLE HCL 2 MG PO TABS   Oral   Take 2 mg by mouth at bedtime.         . ASPIRIN 81 MG PO TABS   Oral   Take 81 mg by mouth 2 (two) times daily.             BP 98/63  Pulse 109  Temp 99.5 F (37.5 C) (Oral)  Resp 19  SpO2 88%  Physical Exam  Nursing note and vitals reviewed. Constitutional: She appears well-developed and well-nourished. No distress.       Laying in bed. Uncomfortable appearing. Obese.  HENT:  Head: Normocephalic and atraumatic.  Eyes: Conjunctivae normal are normal. Right eye exhibits no discharge. Left eye exhibits no discharge.  Neck: Neck supple.  Cardiovascular: Regular rhythm and normal heart sounds.  Exam reveals no gallop and no friction rub.   No murmur heard.      Mild tachycardia.  Pulmonary/Chest: Effort normal. She has wheezes.       Mild tachypnea. Expiratory wheezing b/l.   Abdominal: Soft. She exhibits no distension. There is  no tenderness.  Musculoskeletal: She exhibits no edema and no tenderness.  Neurological: She is alert.  Skin: Skin is warm and dry.  Psychiatric: She has a normal mood and affect. Her behavior is normal. Thought content normal.    ED Course  Procedures (including critical care time)  Labs Reviewed  BASIC METABOLIC PANEL - Abnormal; Notable for the following:    Sodium 131 (*)     Chloride 90 (*)     Glucose, Bld 294 (*)     BUN 28 (*)     GFR calc non Af Amer 55 (*)     GFR calc Af Amer 63 (*)     All other components within normal limits  PRO B NATRIURETIC PEPTIDE - Abnormal; Notable for the following:    Pro B Natriuretic peptide (BNP) 1012.0 (*)     All other components within normal limits  CBC  TROPONIN I   Dg Chest 2 View  02/29/2012  *RADIOLOGY REPORT*  Clinical Data: Dyspnea  CHEST - 2 VIEW  Comparison: 11/19/2010  Findings: Vascular clips in the upper abdomen.  Coarse interstitial opacities in the lung bases as before.  No confluent airspace infiltrate or overt edema.  No effusion.  Heart size normal for technique.  Mild aortic plaque.  Spurring in the lower thoracic spine.  IMPRESSION:  1.  Stable chronic changes.  No acute disease.   Original Report Authenticated By: D. Andria Rhein, MD      1. Dyspnea   2. COPD exacerbation   3. Acute-on-chronic respiratory failure   4. Hypotension   5. Diabetes mellitus type 2 in obese   6. Acute bronchitis   7. Acute respiratory failure   8. H1N1 influenza   9. Pneumococcal bronchitis   10. COPD (chronic obstructive pulmonary disease)   11. Flu syndrome       MDM  56 year old female with  dyspnea. Likely COPD exacerbation. Patient remains hypoxic despite increased oxygen from her baseline requirement after nebs and steroids. CXR w/o focal infiltrate. Likely copd exacerbation. Possible flu. Given oxygen requirement will admit.        Raeford Razor, MD 03/06/12 [phone]

## 2012-03-01 DIAGNOSIS — J101 Influenza due to other identified influenza virus with other respiratory manifestations: Secondary | ICD-10-CM

## 2012-03-01 DIAGNOSIS — J9620 Acute and chronic respiratory failure, unspecified whether with hypoxia or hypercapnia: Secondary | ICD-10-CM

## 2012-03-01 DIAGNOSIS — J209 Acute bronchitis, unspecified: Secondary | ICD-10-CM

## 2012-03-01 DIAGNOSIS — R0609 Other forms of dyspnea: Secondary | ICD-10-CM

## 2012-03-01 LAB — CBC
Platelets: 185 10*3/uL (ref 150–400)
RBC: 4.51 MIL/uL (ref 3.87–5.11)
RDW: 14.2 % (ref 11.5–15.5)
WBC: 8.7 10*3/uL (ref 4.0–10.5)

## 2012-03-01 LAB — BLOOD GAS, ARTERIAL
Acid-Base Excess: 2.1 mmol/L — ABNORMAL HIGH (ref 0.0–2.0)
Bicarbonate: 29.4 mEq/L — ABNORMAL HIGH (ref 20.0–24.0)
Delivery systems: POSITIVE
Delivery systems: POSITIVE
Drawn by: 34779
Expiratory PAP: 6
Expiratory PAP: 5
FIO2: 0.4 %
Inspiratory PAP: 12
O2 Saturation: 89.8 %
Patient temperature: 98.6
Patient temperature: 98.6
RATE: 8 resp/min
pH, Arterial: 7.255 — ABNORMAL LOW (ref 7.350–7.450)

## 2012-03-01 LAB — URINALYSIS, ROUTINE W REFLEX MICROSCOPIC
Bilirubin Urine: NEGATIVE
Glucose, UA: >1000 mg/dL — AB
Ketones, ur: NEGATIVE mg/dL
Specific Gravity, Urine: 1.024 (ref 1.005–1.030)
pH: 5.5 (ref 5.0–8.0)

## 2012-03-01 LAB — MAGNESIUM: Magnesium: 1.5 mg/dL (ref 1.5–2.5)

## 2012-03-01 LAB — GLUCOSE, CAPILLARY
Glucose-Capillary: 511 mg/dL — ABNORMAL HIGH (ref 70–99)
Glucose-Capillary: 423 mg/dL — ABNORMAL HIGH (ref 70–99)
Glucose-Capillary: 159 mg/dL — ABNORMAL HIGH (ref 70–99)
Glucose-Capillary: 149 mg/dL — ABNORMAL HIGH (ref 70–99)
Glucose-Capillary: 175 mg/dL — ABNORMAL HIGH (ref 70–99)
Glucose-Capillary: 167 mg/dL — ABNORMAL HIGH (ref 70–99)

## 2012-03-01 LAB — PHOSPHORUS: Phosphorus: 3 mg/dL (ref 2.3–4.6)

## 2012-03-01 LAB — POCT I-STAT 3, ART BLOOD GAS (G3+)
Bicarbonate: 31.1 mEq/L — ABNORMAL HIGH (ref 20.0–24.0)
O2 Saturation: 95 %
Patient temperature: 97.1
TCO2: 34 mmol/L (ref 0–100)
pO2, Arterial: 92 mmHg (ref 80.0–100.0)

## 2012-03-01 LAB — BASIC METABOLIC PANEL
CO2: 29 mEq/L (ref 19–32)
Calcium: 8.2 mg/dL — ABNORMAL LOW (ref 8.4–10.5)
Creatinine, Ser: 0.99 mg/dL (ref 0.50–1.10)
GFR calc Af Amer: 72 mL/min — ABNORMAL LOW (ref >90)
GFR calc non Af Amer: 62 mL/min — ABNORMAL LOW (ref >90)
Sodium: 137 mEq/L (ref 135–145)

## 2012-03-01 LAB — URINE MICROSCOPIC-ADD ON

## 2012-03-01 MED ORDER — HEPARIN SODIUM (PORCINE) 5000 UNIT/ML IJ SOLN
5000.0000 [IU] | Freq: Three times a day (TID) | INTRAMUSCULAR | Status: DC
  Administered 2012-03-01 – 2012-03-10 (×27): 5000 [IU] via SUBCUTANEOUS
  Filled 2012-03-01 (×30): qty 1

## 2012-03-01 MED ORDER — INSULIN GLARGINE 100 UNIT/ML ~~LOC~~ SOLN
30.0000 [IU] | SUBCUTANEOUS | Status: DC
  Administered 2012-03-01 – 2012-03-02 (×2): 30 [IU] via SUBCUTANEOUS

## 2012-03-01 MED ORDER — PNEUMOCOCCAL VAC POLYVALENT 25 MCG/0.5ML IJ INJ
0.5000 mL | INJECTION | INTRAMUSCULAR | Status: DC
  Filled 2012-03-01: qty 0.5

## 2012-03-01 MED ORDER — CHLORHEXIDINE GLUCONATE 0.12 % MT SOLN
15.0000 mL | Freq: Two times a day (BID) | OROMUCOSAL | Status: DC
  Administered 2012-03-01 – 2012-03-12 (×21): 15 mL via OROMUCOSAL
  Filled 2012-03-01 (×22): qty 15

## 2012-03-01 MED ORDER — BIOTENE DRY MOUTH MT LIQD
15.0000 mL | Freq: Two times a day (BID) | OROMUCOSAL | Status: DC
  Administered 2012-03-01 – 2012-03-04 (×8): 15 mL via OROMUCOSAL

## 2012-03-01 MED ORDER — DEXTROSE 10 % IV SOLN: INTRAVENOUS | Status: DC | PRN

## 2012-03-01 MED ORDER — METHYLPREDNISOLONE SODIUM SUCC 40 MG IJ SOLR
40.0000 mg | Freq: Two times a day (BID) | INTRAMUSCULAR | Status: DC
  Administered 2012-03-01 – 2012-03-02 (×3): 40 mg via INTRAVENOUS
  Filled 2012-03-01 (×5): qty 1

## 2012-03-01 MED ORDER — INSULIN ASPART 100 UNIT/ML ~~LOC~~ SOLN
2.0000 [IU] | SUBCUTANEOUS | Status: DC
  Administered 2012-03-01 (×2): 6 [IU] via SUBCUTANEOUS
  Administered 2012-03-01 – 2012-03-02 (×4): 4 [IU] via SUBCUTANEOUS

## 2012-03-01 NOTE — Progress Notes (Signed)
03/01/12 0545  CCM MD at bedside to evaluate pt post critical ABG results (pH- 7.20, CO2 - 76.4, PO2- 73.4, spO2 - 89.8) this AM. Siquieros MD wrote for new BiPAP orders and a repeat ABG in . Pt educated about intubation and the reasons for why she may have to intubated. Pt is okay with being intubated, but is worried about pulling the ETT tube out (Pt pulled ETT out 3 separate times while in restraints two years ago).   Will continue to monitor  Elisha Headland  03/01/12 0630  Follow up ABG results   pH- 7.255 CO2- 67.3 PO2-98.3 spO2- 96.3  eLink MD notified.   Will continue to monitor.  Elisha Headland RN

## 2012-03-01 NOTE — Evaluation (Signed)
Evaluation on follow up ABG: ABG 03/01/12 6:05 am: 7.25 / 67.3 / 98 Improving on 15/5 of BiPAP. We will continue to monitor.   Overton Mam, MD [facility] PCCM

## 2012-03-01 NOTE — Evaluation (Addendum)
Bedside evaluation for worsening ABG while on BiPAP 12/6 with 40% FiO2. ABG 02/29/12 at 3:07 pm 7.26 / 64.1 / 86 ABG 03/01/12 at 4:50 am: 7.20 / 76.4 / 73.4 Patient awake, alert, breathing 22/min, hemodynamically stable. Adequate air movement on lung exam. Plan: - We will increase BiPAP to 15/5 - Repeat ABG in 30 minutes - If no improvement or worsening, we will proceed with intubation - Patient updated at bedside and agrees with plan.  Overton Mam, MD [facility] PCCM

## 2012-03-01 NOTE — Progress Notes (Signed)
PULMONARY  / CRITICAL CARE MEDICINE  Name: [name] MRN: [id number] DOB: 1955/07/06    LOS: 1  REFERRING MD :  Janee Morn   CHIEF COMPLAINT:  AECOPD and hypotension   BRIEF PATIENT DESCRIPTION:  56 year old female w/ chronic oxygen dependent resp failure on basis of COPD (still actively smokes). Presents 12/27 w/ Acute on chronic respiratory failure on basis of AECOPD and mild hypotension. PCCM asked to eval.   LINES / TUBES:   CULTURES: Influenza pcr 12/27>>>POSITIVE (Flu A/H1N1) u strep 12/27>>>POSITIVE u legionella 12/27>>> Urine culture 12/27>>> Sputum 12/27>>>  ANTIBIOTICS: levaquin 12/27>>> tamiflu 12/27>>>  SIGNIFICANT EVENTS:    LEVEL OF CARE:  ICU PRIMARY SERVICE:  PCCM  CONSULTANTS: Full CODE STATUS: full code  DIET: NPO DVT Px:  [place] heparin  GI Px:  PPI   INTERVAL HISTORY:  Needed increase in IPAP settings overnight.  VITAL SIGNS: Temp:  [96.3 F (35.7 C)-99.5 F (37.5 C)] 96.3 F (35.7 C) (12/28 0429) Pulse Rate:  [86-127] 97  (12/28 0700) Resp:  [14-28] 22  (12/28 0500) BP: (86-140)/(42-116) 102/61 mmHg (12/28 0600) SpO2:  [84 %-98 %] 98 % (12/28 0700) FiO2 (%):  [40 %-50 %] 50 % (12/28 0600) HEMODYNAMICS:   VENTILATOR SETTINGS: Vent Mode:  [-]  FiO2 (%):  [40 %-50 %] 50 % INTAKE / OUTPUT: Intake/Output      12/27 0701 - 12/28 0700 12/28 0701 - 12/29 0700   I.V. 1373    IV Piggyback 850    Total Intake 2223    Urine 450    Total Output 450    Net +1773         Urine Occurrence 1 x    Stool Occurrence 1 x      PHYSICAL EXAMINATION: General: Ill appearing, speaks in full sentences Neuro: Sleepy, easily arousable, follows commands, HEENT: BiPAP mask in place Cardiovascular:  s1s2 regular, no murmur Lungs: Decreased breath sounds, no wheeze Abdomen:  Soft, non tender Musculoskeletal: no edema Skin: no rashes   LABS: Cbc  Lab 03/01/12 0620 02/29/12 1119  WBC 8.7 --  HGB 13.8 13.4  HCT 43.1 43.0  PLT 185 185     Chemistry   Lab 02/29/12 1119  NA 131*  K 4.6  CL 90*  CO2 27  BUN 28*  CREATININE 1.10  CALCIUM 8.6  MG --  PHOS --  GLUCOSE 294*    Liver fxn No results found for this basename: AST:3,ALT:3,ALKPHOS:3,BILITOT:3,PROT:3,ALBUMIN:3 in the last 168 hours coags No results found for this basename: APTT:3,INR:3 in the last 168 hours Sepsis markers  Lab 02/29/12 1512  LATICACIDVEN 2.6*  PROCALCITON 0.33   Cardiac markers  Lab 02/29/12 2101 02/29/12 1512 02/29/12 1119  CKTOTAL -- -- --  CKMB -- -- --  TROPONINI <0.30 <0.30 <0.30   BNP  Lab 02/29/12 1119  PROBNP 1012.0*   ABG  Lab 03/01/12 0605 03/01/12 0450 02/29/12 1507  PHART 7.255* 7.209* 7.268*  PCO2ART 67.3* 76.4* 64.1*  PO2ART 98.3 73.4* 86.0  HCO3 28.9* 29.4* 29.1*  TCO2 30.9 31.7 31    CBG trend  Lab 03/01/12 0647 03/01/12 0600 03/01/12 0427 03/01/12 0330 03/01/12 0219  GLUCAP 138* 127* 159* 172* 185*    IMAGING: Dg Chest 2 View  02/29/2012  *RADIOLOGY REPORT*  Clinical Data: Dyspnea  CHEST - 2 VIEW  Comparison: 11/19/2010  Findings: Vascular clips in the upper abdomen.  Coarse interstitial opacities in the lung bases as before.  No confluent airspace infiltrate or  overt edema.  No effusion.  Heart size normal for technique.  Mild aortic plaque.  Spurring in the lower thoracic spine.  IMPRESSION:  1.  Stable chronic changes.  No acute disease.   Original Report Authenticated By: D. Andria Rhein, MD      DIAGNOSES: Principal Problem:  *Acute-on-chronic respiratory failure Active Problems:  Tobacco abuse  Obesity  Hypotension  Tachycardia  COPD exacerbation  GERD (gastroesophageal reflux disease)  Hyperlipidemia  Diabetes mellitus type 2 in obese  HTN (hypertension)  Chronic pain  Acute bronchitis  Acute respiratory failure  Flu syndrome   ASSESSMENT / PLAN:  PULMONARY  ASSESSMENT: Acute on chronic respiratory failure 2nd to AECOPD with postive H1N1 and Pneumococcal Ag. Likely  also has OSA/OHS. PLAN:   Continue NIPPV F/u ABG >> okay if pH 7.25 or above F/u CXR Change solumedrol to 40 mg q12h Continue scheduled BD's  CARDIOVASCULAR  ASSESSMENT:  Hypotension. Likely from volume depletion.  Resolved 12/28. Hx of HTN, CAD, Hyperlipidemia. PLAN:  Continue IV fluid, but decrease to 75 ml/hr  RENAL  ASSESSMENT:   Mild hyponatremia PLAN:   Monitor renal fx, urine outpt, electrolytes  GASTROINTESTINAL  ASSESSMENT:   Nutrition. PLAN:   NPO while on BiPAP  HEMATOLOGIC  ASSESSMENT:   No acute issue. PLAN:  F/u cbc [place] heparin   INFECTIOUS  ASSESSMENT:   H1N1, Pneumococcal Ag positive. PLAN:   D2/x levaquin, tamiflu  ENDOCRINE  ASSESSMENT:   DM   PLAN:   SSI  NEUROLOGIC  ASSESSMENT:   Acute encephalopathy 2nd to hypercapnia/hypoxia. PLAN:   Supportive care   Critical care time 35 minutes.  Coralyn Helling, MD [facility] Pulmonary/Critical Care 03/01/2012, 7:22 AM Pager:  [phone] After 3pm call: [phone]

## 2012-03-01 NOTE — Progress Notes (Signed)
eLink Physician-Brief Progress Note Patient Name: [name] DOB: [birth year] MRN: [id number]  Date of Service  03/01/2012   HPI/Events of Note  Best Practice   eICU Interventions  SCDs ordered for DVT propy   Intervention Category Intermediate Interventions: Best-practice therapies (e.g. DVT, beta blocker, etc.)  Robie Oats 03/01/2012, 4:28 AM

## 2012-03-02 ENCOUNTER — Inpatient Hospital Stay (HOSPITAL_COMMUNITY): Payer: Medicaid Other

## 2012-03-02 LAB — CBC
HCT: 44.2 % (ref 36.0–46.0)
MCHC: 30.3 g/dL (ref 30.0–36.0)
MCV: 99.5 fL (ref 78.0–100.0)
RDW: 14.5 % (ref 11.5–15.5)

## 2012-03-02 LAB — GLUCOSE, CAPILLARY
Glucose-Capillary: 174 mg/dL — ABNORMAL HIGH (ref 70–99)
Glucose-Capillary: 178 mg/dL — ABNORMAL HIGH (ref 70–99)

## 2012-03-02 LAB — BASIC METABOLIC PANEL
BUN: 27 mg/dL — ABNORMAL HIGH (ref 6–23)
CO2: 33 mEq/L — ABNORMAL HIGH (ref 19–32)
Chloride: 98 mEq/L (ref 96–112)
Chloride: 98 mEq/L (ref 96–112)
Creatinine, Ser: 0.93 mg/dL (ref 0.50–1.10)
GFR calc Af Amer: 81 mL/min — ABNORMAL LOW (ref >90)
GFR calc Af Amer: 78 mL/min — ABNORMAL LOW (ref >90)
GFR calc non Af Amer: 67 mL/min — ABNORMAL LOW (ref >90)
Glucose, Bld: 195 mg/dL — ABNORMAL HIGH (ref 70–99)
Potassium: 5.8 mEq/L — ABNORMAL HIGH (ref 3.5–5.1)

## 2012-03-02 LAB — BLOOD GAS, ARTERIAL
Bicarbonate: 30.6 mEq/L — ABNORMAL HIGH (ref 20.0–24.0)
TCO2: 33 mmol/L (ref 0–100)
pCO2 arterial: 78.1 mmHg (ref 35.0–45.0)
pH, Arterial: 7.218 — ABNORMAL LOW (ref 7.350–7.450)

## 2012-03-02 LAB — URINE CULTURE: Colony Count: NO GROWTH

## 2012-03-02 LAB — LEGIONELLA ANTIGEN, URINE

## 2012-03-02 LAB — PROCALCITONIN: Procalcitonin: 0.12 ng/mL

## 2012-03-02 MED ORDER — INSULIN ASPART 100 UNIT/ML ~~LOC~~ SOLN
4.0000 [IU] | SUBCUTANEOUS | Status: DC
  Administered 2012-03-02: 4 [IU] via SUBCUTANEOUS

## 2012-03-02 MED ORDER — FUROSEMIDE 10 MG/ML IJ SOLN
20.0000 mg | Freq: Once | INTRAMUSCULAR | Status: AC
  Administered 2012-03-02: 20 mg via INTRAVENOUS
  Filled 2012-03-02: qty 2

## 2012-03-02 MED ORDER — DEXTROSE 50 % IV SOLN: 25.0000 mL | INTRAVENOUS | Status: DC | PRN

## 2012-03-02 MED ORDER — SODIUM CHLORIDE 0.9 % IV SOLN
INTRAVENOUS | Status: DC
  Administered 2012-03-02: 1.5 [IU]/h via INTRAVENOUS
  Filled 2012-03-02: qty 1

## 2012-03-02 MED ORDER — SODIUM CHLORIDE 0.9 % IV SOLN
INTRAVENOUS | Status: DC
  Administered 2012-03-02: 21:00:00 via INTRAVENOUS
  Administered 2012-03-03: 50 mL/h via INTRAVENOUS
  Administered 2012-03-03: 21:00:00 via INTRAVENOUS
  Administered 2012-03-07: 1000 mL via INTRAVENOUS

## 2012-03-02 MED ORDER — LEVALBUTEROL HCL 0.63 MG/3ML IN NEBU
0.6300 mg | INHALATION_SOLUTION | Freq: Four times a day (QID) | RESPIRATORY_TRACT | Status: DC
  Administered 2012-03-02 – 2012-03-03 (×4): 0.63 mg via RESPIRATORY_TRACT
  Filled 2012-03-02 (×8): qty 3

## 2012-03-02 MED ORDER — LEVALBUTEROL HCL 0.63 MG/3ML IN NEBU
0.6300 mg | INHALATION_SOLUTION | RESPIRATORY_TRACT | Status: DC
  Filled 2012-03-02 (×8): qty 3

## 2012-03-02 MED ORDER — PANTOPRAZOLE SODIUM 40 MG PO TBEC
40.0000 mg | DELAYED_RELEASE_TABLET | Freq: Every day | ORAL | Status: DC
  Administered 2012-03-02 – 2012-03-05 (×4): 40 mg via ORAL
  Filled 2012-03-02 (×4): qty 1

## 2012-03-02 MED ORDER — DEXTROSE-NACL 5-0.45 % IV SOLN
INTRAVENOUS | Status: DC
  Administered 2012-03-02: 22:00:00 via INTRAVENOUS

## 2012-03-02 MED ORDER — INSULIN REGULAR BOLUS VIA INFUSION
0.0000 [IU] | Freq: Three times a day (TID) | INTRAVENOUS | Status: DC
  Filled 2012-03-02: qty 10

## 2012-03-02 MED ORDER — SODIUM POLYSTYRENE SULFONATE 15 GM/60ML PO SUSP
30.0000 g | Freq: Once | ORAL | Status: AC
  Administered 2012-03-02: 30 g via ORAL
  Filled 2012-03-02: qty 120

## 2012-03-02 MED ORDER — ACETAZOLAMIDE SODIUM 500 MG IJ SOLR
250.0000 mg | Freq: Two times a day (BID) | INTRAMUSCULAR | Status: AC
  Administered 2012-03-02 – 2012-03-04 (×4): 250 mg via INTRAVENOUS
  Filled 2012-03-02 (×4): qty 500

## 2012-03-02 NOTE — Progress Notes (Signed)
eLink Physician-Brief Progress Note Patient Name: [name] DOB: November 29, 1955 MRN: [id number]  Date of Service  03/02/2012   HPI/Events of Note   abg reviewed  eICU Interventions  Increase IPAP   Intervention Category Major Interventions: Respiratory failure - evaluation and management  Nelda Bucks. 03/02/2012, 5:40 AM

## 2012-03-02 NOTE — Progress Notes (Signed)
eLink Physician-Brief Progress Note Patient Name: [name] DOB: 1955-06-26 MRN: [id number]  Date of Service  03/02/2012   HPI/Events of Note   Mild hyperK, acidosis likley main issue  eICU Interventions  Kayxlate, lasix, bmet at noon Increase TV with ipap increas   Intervention Category Major Interventions: Acid-Base disturbance - evaluation and management  FEINSTEIN,DANIEL J. 03/02/2012, 5:53 AM

## 2012-03-02 NOTE — Progress Notes (Addendum)
PULMONARY  / CRITICAL CARE MEDICINE  Name: AMERAH PULEO MRN: [id number] DOB: [birth year]    LOS: 2  REFERRING MD :  Janee Morn   CHIEF COMPLAINT:  AECOPD and hypotension   BRIEF PATIENT DESCRIPTION:  56 year old female w/ chronic oxygen dependent resp failure on basis of COPD (still actively smokes). Presents 12/27 w/ Acute on chronic respiratory failure on basis of AECOPD and mild hypotension. PCCM asked to eval.   LINES / TUBES:   CULTURES: Influenza pcr 12/27>>>POSITIVE (Flu A/H1N1) u strep 12/27>>>POSITIVE u legionella 12/27>>> Urine culture 12/27>>> Sputum 12/27>>>  ANTIBIOTICS: levaquin 12/27>>> tamiflu 12/27>>>  SIGNIFICANT EVENTS:    LEVEL OF CARE:  ICU PRIMARY SERVICE:  PCCM  CONSULTANTS: Full CODE STATUS: full code  DIET: Clear liquids DVT Px:  [place] heparin  GI Px:  PPI   INTERVAL HISTORY:  Needed increase in IPAP settings overnight.  VITAL SIGNS: Temp:  [97.1 F (36.2 C)-97.9 F (36.6 C)] 97.8 F (36.6 C) (12/29 0737) Pulse Rate:  [85-126] 90  (12/29 0846) Resp:  [10-28] 18  (12/29 0846) BP: (79-125)/(39-95) 125/71 mmHg (12/29 0846) SpO2:  [73 %-100 %] 100 % (12/29 0846) FiO2 (%):  [30 %-70 %] 50 % (12/29 0846) Weight:  [111 kg (244 lb 11.4 oz)-112.2 kg (247 lb 5.7 oz)] 111 kg (244 lb 11.4 oz) (12/29 0429) HEMODYNAMICS:   VENTILATOR SETTINGS: Vent Mode:  [-] PRVC FiO2 (%):  [30 %-70 %] 50 % Set Rate:  [12 bmp] 12 bmp Vt Set:  [450 mL] 450 mL PEEP:  [5 cmH20] 5 cmH20 INTAKE / OUTPUT: Intake/Output      12/28 0701 - 12/29 0700 12/29 0701 - 12/30 0700   P.O. 900    I.V. (mL/kg) 1741.8 (15.7)    IV Piggyback 102    Total Intake(mL/kg) 2743.8 (24.7)    Urine (mL/kg/hr) 2625 (1)    Total Output 2625    Net +118.8         Urine Occurrence 2 x    Stool Occurrence 1 x      PHYSICAL EXAMINATION: General: Ill appearing, speaks in full sentences Neuro: Sleepy, easily arousable, follows commands, HEENT: BiPAP mask in place, no clear  JVD Cardiovascular:  s1s2 regular, no murmur Lungs:exp wheeze, + accessory muscle use  Abdomen:  Soft, non tender Musculoskeletal: no edema Skin: no rashes   LABS: Cbc  Lab 03/02/12 0430 03/01/12 0620 02/29/12 1119  WBC 13.0* -- --  HGB 13.4 13.8 13.4  HCT 44.2 43.1 43.0  PLT 201 185 185    Chemistry   Lab 03/02/12 0430 03/01/12 0620 02/29/12 1119  NA 137 137 131*  K 5.8* 4.9 4.6  CL 98 99 90*  CO2 33* 29 27  BUN 27* 34* 28*  CREATININE 0.90 0.99 1.10  CALCIUM 8.2* 8.2* 8.6  MG -- 1.5 --  PHOS -- 3.0 --  GLUCOSE 195* 129* 294*    Liver fxn No results found for this basename: AST:3,ALT:3,ALKPHOS:3,BILITOT:3,PROT:3,ALBUMIN:3 in the last 168 hours coags No results found for this basename: APTT:3,INR:3 in the last 168 hours Sepsis markers  Lab 03/02/12 0430 02/29/12 1512  LATICACIDVEN -- 2.6*  PROCALCITON 0.12 0.33   Cardiac markers  Lab 02/29/12 2101 02/29/12 1512 02/29/12 1119  CKTOTAL -- -- --  CKMB -- -- --  TROPONINI <0.30 <0.30 <0.30   BNP  Lab 02/29/12 1119  PROBNP 1012.0*   ABG  Lab 03/02/12 0515 03/01/12 1313 03/01/12 0605  PHART 7.218* 7.189* 7.255*  PCO2ART 78.1*  80.8* 67.3*  PO2ART 102.0* 92.0 98.3  HCO3 30.6* 31.1* 28.9*  TCO2 33.0 34 30.9    CBG trend  Lab 03/02/12 0713 03/02/12 0340 03/01/12 2345 03/01/12 1948 03/01/12 1517  GLUCAP 178* 174* 158* 204* 218*    IMAGING: Dg Chest 2 View  02/29/2012  *RADIOLOGY REPORT*  Clinical Data: Dyspnea  CHEST - 2 VIEW  Comparison: 11/19/2010  Findings: Vascular clips in the upper abdomen.  Coarse interstitial opacities in the lung bases as before.  No confluent airspace infiltrate or overt edema.  No effusion.  Heart size normal for technique.  Mild aortic plaque.  Spurring in the lower thoracic spine.  IMPRESSION:  1.  Stable chronic changes.  No acute disease.   Original Report Authenticated By: D. Andria Rhein, MD    Dg Chest Port 1 View  03/02/2012  *RADIOLOGY REPORT*  Clinical Data:  Follow up atelectasis  PORTABLE CHEST - 1 VIEW  Comparison: 02/29/2012  Findings: Normal heart size.  No pleural effusion or edema.  There is eventration of the right hemidiaphragm.  Scar like densities are noted in the left base.  No airspace consolidation noted.  IMPRESSION:  1.  Left base scarring. 2.  No acute disease.   Original Report Authenticated By: Signa Kell, M.D.       DIAGNOSES: Principal Problem:  *Acute-on-chronic respiratory failure Active Problems:  Tobacco abuse  Obesity  Hypotension  Tachycardia  COPD exacerbation  GERD (gastroesophageal reflux disease)  Hyperlipidemia  Diabetes mellitus type 2 in obese  HTN (hypertension)  Chronic pain  Acute bronchitis  Acute respiratory failure  Flu syndrome  H1N1 influenza  Pneumococcal bronchitis   ASSESSMENT / PLAN:  PULMONARY  ASSESSMENT: Acute on chronic respiratory failure 2nd to AECOPD with postive H1N1 and Pneumococcal Ag. Likely also has OSA/OHS. Very slow improvement. Still w/ sig wheeze. Worried she is starting to fatigue on NIPPV.  PLAN:   Continue NIPPV F/u CXR Changed solumedrol to 40 mg q12h, will hold here  Continue scheduled BD's  CARDIOVASCULAR  ASSESSMENT:  Hx of HTN, CAD, Hyperlipidemia. PLAN:  Continue IV fluid, but decrease to 75 ml/hr Holding antihypertensives   RENAL  ASSESSMENT:   Mild hyponatremia Hyperkalemia: probably acidosis related-->Treated  PLAN:   Monitor renal fx, urine outpt, electrolytes F/u chemistry   GASTROINTESTINAL  ASSESSMENT:   Nutrition. PLAN:   Clear liquid diet PPI  HEMATOLOGIC  ASSESSMENT:   No acute issue. PLAN:  F/u cbc  heparin   INFECTIOUS  ASSESSMENT:   H1N1, Pneumococcal Ag positive. PLAN:   D3/x levaquin, tamiflu  ENDOCRINE  ASSESSMENT:   DM   Glucose still on high side.  PLAN:   SSI/ adjust as indicated   NEUROLOGIC  ASSESSMENT:   Acute encephalopathy 2nd to hypercapnia/hypoxia. Follows commands, more sleepy on  exam 12/29.  PLAN:   Supportive care   Reviewed above, examined pt, and agree with assessment/plan.  She is still marginal with respiratory status, but maintaining mental status.  Will continue BiPAP for now.  Will base decision on need to intubate on respiratory mechanics and mental status.  Try to limit ABG's.  Critical care time 35 minutes.  Coralyn Helling, MD [facility] Pulmonary/Critical Care 03/02/2012, 10:45 AM Pager:  [phone] After 3pm call: [phone]

## 2012-03-02 NOTE — Progress Notes (Signed)
Blood gas results called  Into Dr Rhys Martini Results for [name], [name] (MRN [id number]) as of 03/02/2012 05:28  Ref. Range 03/02/2012 05:15  Sample type No range found ARTERIAL DRAW  Delivery systems No range found BILEVEL POSITIVE AIRWAY PRESSURE  FIO2 No range found 0.50  Mode No range found BILEVEL POSITIVE AIRWAY PRESSURE  Inspiratory PAP No range found 16  Expiratory PAP No range found 8  pH, Arterial Latest Range: 7.350-7.450  7.218 (L)  pCO2 arterial Latest Range: 35.0-45.0 mmHg 78.1 (HH)  pO2, Arterial Latest Range: 80.0-100.0 mmHg 102.0 (H)  Bicarbonate Latest Range: 20.0-24.0 mEq/L 30.6 (H)  TCO2 Latest Range: 0-100 mmol/L 33.0  Acid-Base Excess Latest Range: 0.0-2.0 mmol/L 3.4 (H)  O2 Saturation No range found 96.6  Patient temperature No range found 98.6  Collection site No range found LEFT RADIAL  Allens test (pass/fail)    Latest Range: PASS  PASS

## 2012-03-02 NOTE — Progress Notes (Signed)
AM Potassium level 5.8.Relayed to Dr Tyson Alias

## 2012-03-03 LAB — POCT I-STAT 3, ART BLOOD GAS (G3+)
Bicarbonate: 32.9 mEq/L — ABNORMAL HIGH (ref 20.0–24.0)
Bicarbonate: 34.3 mEq/L — ABNORMAL HIGH (ref 20.0–24.0)
Patient temperature: 97.9
TCO2: 35 mmol/L (ref 0–100)
pCO2 arterial: 74.7 mmHg (ref 35.0–45.0)
pH, Arterial: 7.251 — ABNORMAL LOW (ref 7.350–7.450)
pH, Arterial: 7.239 — ABNORMAL LOW (ref 7.350–7.450)

## 2012-03-03 LAB — COMPREHENSIVE METABOLIC PANEL
ALT: 202 U/L — ABNORMAL HIGH (ref 0–35)
AST: 228 U/L — ABNORMAL HIGH (ref 0–37)
Albumin: 3.2 g/dL — ABNORMAL LOW (ref 3.5–5.2)
Calcium: 8 mg/dL — ABNORMAL LOW (ref 8.4–10.5)
Creatinine, Ser: 0.78 mg/dL (ref 0.50–1.10)
Sodium: 137 mEq/L (ref 135–145)

## 2012-03-03 LAB — GLUCOSE, CAPILLARY
Glucose-Capillary: 110 mg/dL — ABNORMAL HIGH (ref 70–99)
Glucose-Capillary: 117 mg/dL — ABNORMAL HIGH (ref 70–99)
Glucose-Capillary: 128 mg/dL — ABNORMAL HIGH (ref 70–99)
Glucose-Capillary: 155 mg/dL — ABNORMAL HIGH (ref 70–99)
Glucose-Capillary: 182 mg/dL — ABNORMAL HIGH (ref 70–99)
Glucose-Capillary: 196 mg/dL — ABNORMAL HIGH (ref 70–99)
Glucose-Capillary: 215 mg/dL — ABNORMAL HIGH (ref 70–99)

## 2012-03-03 LAB — CBC
MCH: 30.9 pg (ref 26.0–34.0)
MCHC: 30.9 g/dL (ref 30.0–36.0)
MCV: 99.8 fL (ref 78.0–100.0)
Platelets: 172 10*3/uL (ref 150–400)
RDW: 14.6 % (ref 11.5–15.5)
WBC: 7.6 10*3/uL (ref 4.0–10.5)

## 2012-03-03 LAB — TROPONIN I: Troponin I: <0.3 ng/mL (ref <0.30)

## 2012-03-03 MED ORDER — INSULIN GLARGINE 100 UNIT/ML ~~LOC~~ SOLN: 10.0000 [IU] | Freq: Every day | SUBCUTANEOUS | Status: DC

## 2012-03-03 MED ORDER — ISOSORBIDE MONONITRATE ER 30 MG PO TB24
30.0000 mg | ORAL_TABLET | Freq: Every day | ORAL | Status: DC
  Administered 2012-03-03 – 2012-03-12 (×10): 30 mg via ORAL
  Filled 2012-03-03 (×10): qty 1

## 2012-03-03 MED ORDER — INSULIN ASPART 100 UNIT/ML ~~LOC~~ SOLN
6.0000 [IU] | Freq: Three times a day (TID) | SUBCUTANEOUS | Status: DC
  Administered 2012-03-03 – 2012-03-04 (×3): 6 [IU] via SUBCUTANEOUS

## 2012-03-03 MED ORDER — ALBUTEROL SULFATE (5 MG/ML) 0.5% IN NEBU
2.5000 mg | INHALATION_SOLUTION | Freq: Four times a day (QID) | RESPIRATORY_TRACT | Status: DC
  Administered 2012-03-03 – 2012-03-04 (×6): 2.5 mg via RESPIRATORY_TRACT
  Administered 2012-03-05: 5 mg via RESPIRATORY_TRACT
  Administered 2012-03-05: 2.5 mg via RESPIRATORY_TRACT
  Filled 2012-03-03 (×8): qty 0.5

## 2012-03-03 MED ORDER — LORAZEPAM 2 MG/ML IJ SOLN
INTRAMUSCULAR | Status: AC
  Administered 2012-03-03: 0.5 mg via INTRAVENOUS
  Filled 2012-03-03: qty 1

## 2012-03-03 MED ORDER — DEXMEDETOMIDINE HCL IN NACL 200 MCG/50ML IV SOLN
0.2000 ug/kg/h | INTRAVENOUS | Status: DC
  Administered 2012-03-03: 0.7 ug/kg/h via INTRAVENOUS
  Administered 2012-03-03: 0.2 ug/kg/h via INTRAVENOUS
  Administered 2012-03-04: 0.7 ug/kg/h via INTRAVENOUS
  Filled 2012-03-03 (×3): qty 50

## 2012-03-03 MED ORDER — GUAIFENESIN ER 600 MG PO TB12
1200.0000 mg | ORAL_TABLET | Freq: Two times a day (BID) | ORAL | Status: DC | PRN
  Filled 2012-03-03: qty 2

## 2012-03-03 MED ORDER — LORAZEPAM 2 MG/ML IJ SOLN
0.5000 mg | Freq: Once | INTRAMUSCULAR | Status: AC
  Administered 2012-03-03: 0.5 mg via INTRAVENOUS

## 2012-03-03 MED ORDER — INSULIN ASPART 100 UNIT/ML ~~LOC~~ SOLN
0.0000 [IU] | Freq: Every day | SUBCUTANEOUS | Status: DC
  Administered 2012-03-03: 2 [IU] via SUBCUTANEOUS

## 2012-03-03 MED ORDER — INSULIN ASPART 100 UNIT/ML ~~LOC~~ SOLN
0.0000 [IU] | Freq: Three times a day (TID) | SUBCUTANEOUS | Status: DC
  Administered 2012-03-03 (×2): 4 [IU] via SUBCUTANEOUS
  Administered 2012-03-04: 3 [IU] via SUBCUTANEOUS

## 2012-03-03 MED ORDER — ALBUTEROL SULFATE (5 MG/ML) 0.5% IN NEBU: 2.5000 mg | INHALATION_SOLUTION | RESPIRATORY_TRACT | Status: DC | PRN

## 2012-03-03 MED ORDER — PREDNISONE 20 MG PO TABS
20.0000 mg | ORAL_TABLET | Freq: Every day | ORAL | Status: DC
  Administered 2012-03-04: 20 mg via ORAL
  Filled 2012-03-03 (×3): qty 1

## 2012-03-03 MED ORDER — METOPROLOL TARTRATE 25 MG PO TABS
25.0000 mg | ORAL_TABLET | Freq: Two times a day (BID) | ORAL | Status: DC
  Administered 2012-03-03 – 2012-03-05 (×3): 25 mg via ORAL
  Filled 2012-03-03 (×6): qty 1

## 2012-03-03 MED ORDER — LORAZEPAM 2 MG/ML IJ SOLN
0.5000 mg | Freq: Once | INTRAMUSCULAR | Status: AC
  Administered 2012-03-03: 0.5 mg via INTRAVENOUS
  Filled 2012-03-03: qty 1

## 2012-03-03 MED ORDER — INSULIN GLARGINE 100 UNIT/ML ~~LOC~~ SOLN
10.0000 [IU] | Freq: Every day | SUBCUTANEOUS | Status: DC
  Administered 2012-03-03 – 2012-03-07 (×5): 10 [IU] via SUBCUTANEOUS

## 2012-03-03 MED ORDER — ASPIRIN 81 MG PO CHEW
81.0000 mg | CHEWABLE_TABLET | Freq: Every day | ORAL | Status: DC
  Administered 2012-03-03 – 2012-03-12 (×10): 81 mg via ORAL
  Filled 2012-03-03 (×9): qty 1

## 2012-03-03 MED ORDER — HALOPERIDOL LACTATE 5 MG/ML IJ SOLN
INTRAMUSCULAR | Status: AC
  Administered 2012-03-03: 5 mg
  Filled 2012-03-03: qty 1

## 2012-03-03 MED ORDER — LEVOFLOXACIN 500 MG PO TABS
500.0000 mg | ORAL_TABLET | Freq: Every day | ORAL | Status: DC
  Administered 2012-03-03 – 2012-03-04 (×2): 500 mg via ORAL
  Filled 2012-03-03 (×3): qty 1

## 2012-03-03 MED ORDER — HALOPERIDOL LACTATE 5 MG/ML IJ SOLN
INTRAMUSCULAR | Status: AC
  Filled 2012-03-03: qty 1

## 2012-03-03 NOTE — Progress Notes (Signed)
Inpatient Diabetes Program Recommendations  AACE/ADA: New Consensus Statement on Inpatient Glycemic Control (2013)  Target Ranges:  Prepandial:   less than 140 mg/dL      Peak postprandial:   less than 180 mg/dL (1-2 hours)      Critically ill patients:  140 - 180 mg/dL   Results for [name], [name] (MRN [id number]) as of 03/03/2012 10:24  Ref. Range 03/02/2012 23:04 03/02/2012 23:55 03/03/2012 01:06 03/03/2012 02:46 03/03/2012 03:52 03/03/2012 04:44 03/03/2012 08:06  Glucose-Capillary Latest Range: 70-99 mg/dL 130 (H) 865 (H) 784 (H) 128 (H) 110 (H) 117 (H) 155 (H)     Note: Patient has been transitioned off of the insulin drip to subcutaneous insulin.  Orders written for Lantus 10 units QHS, Novolog resistant scale correction ACHS, and Novolog 6 units TID with meals for glycemic control.  Called [facility] and spoke with bedside nurse Oswaldo Done, RN) and asked that he ask the doctor if Lantus can be given now since the insulin drip has been discontinued and there is a concern that blood glucose will continue to rise if Lantus is not given until bedtime.  Oswaldo Done, RN states that he will discuss with the doctor.  Will continue to follow.  Thanks, Orlando Penner, RN, BSN, CCRN Diabetes Coordinator Inpatient Diabetes Program [phone]

## 2012-03-03 NOTE — Progress Notes (Signed)
Patient continues to exhaust herself on bi-pap, taking the mask of frequently, unsure how successful patient is being oxygenated. Elink contacted for appropriate orders

## 2012-03-03 NOTE — Progress Notes (Signed)
eLink Physician-Brief Progress Note Patient Name: [name] DOB: 12-13-1955 MRN: [id number]  Date of Service  03/03/2012   HPI/Events of Note   Agitation on BIPAP, O2 sats OK, resp status unchanged  eICU Interventions  IV ativan x1   Intervention Category Minor Interventions: Agitation / anxiety - evaluation and management  MCQUAID, DOUGLAS 03/03/2012, 3:51 AM

## 2012-03-03 NOTE — Progress Notes (Signed)
PULMONARY  / CRITICAL CARE MEDICINE  Name: [name] MRN: [id number] DOB: 09-02-1955    LOS: 3  REFERRING MD :  Janee Morn   CHIEF COMPLAINT:  AECOPD and hypotension   BRIEF PATIENT DESCRIPTION:  56 yo female smoker admitted on 02/29/2012 with dyspnea, wheeze, cough 2nd to AECOPD with Flu A/H1N1 and Pneumococcus.  Previously followed by Dr. Vassie Loll as outpt.  Significant PMHx of COPD on home oxygen, GERD, Hyperlipidemia, Chronic pain, HTN, CAD, DM  LINES / TUBES: PIV  CULTURES: Influenza pcr 12/27>>>POSITIVE (Flu A/H1N1) u strep 12/27>>>POSITIVE u legionella 12/27>>>negative Urine culture 12/27>>>negative  ANTIBIOTICS: levaquin 12/27>>> tamiflu 12/27>>>  SIGNIFICANT EVENTS:  12/27 Admit ICU requiring BiPAP  DVT Px:  [place] heparin  GI Px:  PPI   INTERVAL HISTORY:  Respiratory mechanics improved.  Feels breathing is better.  Needed ativan overnight for agitation while on BiPAP.  VITAL SIGNS: Temp:  [97.7 F (36.5 C)-98.9 F (37.2 C)] 98.1 F (36.7 C) (12/30 0812) Pulse Rate:  [45-110] 94  (12/30 0730) Resp:  [12-29] 18  (12/30 0730) BP: (94-152)/(57-106) 152/76 mmHg (12/30 0730) SpO2:  [50 %-100 %] 100 % (12/30 0730) FiO2 (%):  [40 %-50 %] 50 % (12/30 0600) HEMODYNAMICS:   VENTILATOR SETTINGS: Vent Mode:  [-]  FiO2 (%):  [40 %-50 %] 50 % INTAKE / OUTPUT: Intake/Output      12/29 0701 - 12/30 0700 12/30 0701 - 12/31 0700   P.O. 1560    I.V. (mL/kg) 1690 (15.2)    IV Piggyback 100    Total Intake(mL/kg) 3350 (30.2)    Urine (mL/kg/hr) 1845 (0.7)    Total Output 1845    Net +1505         Stool Occurrence 2 x      PHYSICAL EXAMINATION: General: No distress Neuro: Calm, appropriate, follows commands HEENT: BiPAP mask in place Cardiovascular:  s1s2 regular, no murmur Lungs: Decreased breath sounds, no wheeze Abdomen:  Soft, non tender Musculoskeletal: no edema Skin: no rashes   LABS: Cbc  Lab 03/03/12 0420 03/02/12 0430 03/01/12 0620  WBC 7.6  -- --  HGB 14.7 13.4 13.8  HCT 47.5* 44.2 43.1  PLT 172 201 185    Chemistry   Lab 03/03/12 0420 03/02/12 1222 03/02/12 0430 03/01/12 0620  NA 137 139 137 --  K 4.0 3.8 5.8* --  CL 98 98 98 --  CO2 30 34* 33* --  BUN 19 23 27* --  CREATININE 0.78 0.93 0.90 --  CALCIUM 8.0* 8.0* 8.2* --  MG -- -- -- 1.5  PHOS -- -- -- 3.0  GLUCOSE 126* 112* 195* --    Liver fxn  Lab 03/03/12 0420  AST 228*  ALT 202*  ALKPHOS 58  BILITOT 0.2*  PROT 6.8  ALBUMIN 3.2*    BNP  Lab 02/29/12 1119  PROBNP 1012.0*   ABG  Lab 03/02/12 0515 03/01/12 1313 03/01/12 0605  PHART 7.218* 7.189* 7.255*  PCO2ART 78.1* 80.8* 67.3*  PO2ART 102.0* 92.0 98.3  HCO3 30.6* 31.1* 28.9*  TCO2 33.0 34 30.9    CBG trend  Lab 03/03/12 0806 03/03/12 0444 03/03/12 0352 03/03/12 0246 03/03/12 0106  GLUCAP 155* 117* 110* 128* 152*    IMAGING: Dg Chest Port 1 View  03/02/2012  *RADIOLOGY REPORT*  Clinical Data: Follow up atelectasis  PORTABLE CHEST - 1 VIEW  Comparison: 02/29/2012  Findings: Normal heart size.  No pleural effusion or edema.  There is eventration of the right hemidiaphragm.  Scar like  densities are noted in the left base.  No airspace consolidation noted.  IMPRESSION:  1.  Left base scarring. 2.  No acute disease.   Original Report Authenticated By: Signa Kell, M.D.      ASSESSMENT / PLAN:  PULMONARY  ASSESSMENT: Acute on chronic respiratory failure 2nd to AECOPD with postive H1N1 and Pneumococcal Ag. Likely also has OSA/OHS. PLAN:   BiPAP qhs and prn during day F/u CXR intermittently Discontinue solumedrol Prednisone 20 mg daily, and wean off as tolerated Continue scheduled BD's  CARDIOVASCULAR  ASSESSMENT:  Hx of HTN, CAD, Hyperlipidemia. PLAN:  KVO IV fluids Restart lopressor, imdur, ASA Hold benicar for now  RENAL  ASSESSMENT:   Metabolic alkalosis 2nd to respiratory acidosis. Added diamox 12/29. PLAN:   Continue diamox Monitor renal fx, urine outpt,  electrolytes  GASTROINTESTINAL  ASSESSMENT:   Nutrition. Elevated LFT's. PLAN:   Modified CHO diet F/u LFT's PPI  HEMATOLOGIC  ASSESSMENT:   No acute issue. PLAN:  F/u cbc intermittently  INFECTIOUS  ASSESSMENT:   H1N1, Pneumococcal Ag positive. PLAN:   D4/x levaquin, tamiflu  ENDOCRINE  ASSESSMENT:   DM type II with hyperglycemia  PLAN:   SSI  NEUROLOGIC  ASSESSMENT:   Acute encephalopathy 2nd to hypercapnia/hypoxia. Chronic pain. PLAN:   Avoid benzo's, narcotics if possible   Critical care time 35 minutes.  Coralyn Helling, MD [facility] Pulmonary/Critical Care 03/03/2012, 8:44 AM Pager:  [phone] After 3pm call: [phone]

## 2012-03-03 NOTE — Progress Notes (Signed)
Utilization review completed. Ozzie Remmers, RN, BSN. 

## 2012-03-03 NOTE — Progress Notes (Signed)
The patient is agitated and refusing BiPAP.   We will try Precedex for agitation and reassess.

## 2012-03-03 NOTE — Progress Notes (Signed)
ABG reviewed, respiratory acidosis  She might benefit from BiPAP but she does not tolerate it and refuses it during the day.

## 2012-03-03 NOTE — Progress Notes (Signed)
Continues to be confused, agitated, not tolerating bi-pap well, trashing in bed. Unable to settle down. Heart rate in the 140's.  Respiratory rate >35. Mask refitted and sedation increased.

## 2012-03-03 NOTE — Progress Notes (Signed)
The patient more confused, not following commends; ABG pending  We will ask Dr. Herma Carson to see the patient.

## 2012-03-03 NOTE — Progress Notes (Signed)
Patient very confused getting out of bed, increasing work of breathing, pulled out IV's. Not refusing bipap but requires some sedation before able to tolerate bi-pap, current concern is for patient safety and breathing stability. Will contact elink for orders.

## 2012-03-04 ENCOUNTER — Inpatient Hospital Stay (HOSPITAL_COMMUNITY): Payer: Medicaid Other

## 2012-03-04 LAB — URINALYSIS, ROUTINE W REFLEX MICROSCOPIC
Bilirubin Urine: NEGATIVE
Glucose, UA: 100 mg/dL — AB
Hgb urine dipstick: NEGATIVE
Specific Gravity, Urine: 1.018 (ref 1.005–1.030)
pH: 6.5 (ref 5.0–8.0)

## 2012-03-04 LAB — POCT I-STAT 3, ART BLOOD GAS (G3+)
Acid-Base Excess: 2 mmol/L (ref 0.0–2.0)
Bicarbonate: 32.2 mEq/L — ABNORMAL HIGH (ref 20.0–24.0)
Bicarbonate: 32.6 mEq/L — ABNORMAL HIGH (ref 20.0–24.0)
O2 Saturation: 84 %
Patient temperature: 97
TCO2: 34 mmol/L (ref 0–100)
TCO2: 35 mmol/L (ref 0–100)
pCO2 arterial: 71.6 mmHg (ref 35.0–45.0)
pCO2 arterial: 85.2 mmHg (ref 35.0–45.0)
pH, Arterial: 7.256 — ABNORMAL LOW (ref 7.350–7.450)
pH, Arterial: 7.188 — CL (ref 7.350–7.450)

## 2012-03-04 LAB — BLOOD GAS, ARTERIAL
Acid-Base Excess: 4.1 mmol/L — ABNORMAL HIGH (ref 0.0–2.0)
Bicarbonate: 31.8 mEq/L — ABNORMAL HIGH (ref 20.0–24.0)
Drawn by: 36277
FIO2: 0.7 %
O2 Saturation: 99.5 %
TCO2: 34.4 mmol/L (ref 0–100)
pCO2 arterial: 85.7 mmHg (ref 35.0–45.0)
pO2, Arterial: 139 mmHg — ABNORMAL HIGH (ref 80.0–100.0)

## 2012-03-04 LAB — COMPREHENSIVE METABOLIC PANEL
ALT: 135 U/L — ABNORMAL HIGH (ref 0–35)
BUN: 18 mg/dL (ref 6–23)
CO2: 28 mEq/L (ref 19–32)
Calcium: 7.6 mg/dL — ABNORMAL LOW (ref 8.4–10.5)
Creatinine, Ser: 0.71 mg/dL (ref 0.50–1.10)
GFR calc Af Amer: >90 mL/min (ref >90)
GFR calc non Af Amer: >90 mL/min (ref >90)
Glucose, Bld: 144 mg/dL — ABNORMAL HIGH (ref 70–99)
Sodium: 137 mEq/L (ref 135–145)
Total Protein: 5.7 g/dL — ABNORMAL LOW (ref 6.0–8.3)

## 2012-03-04 LAB — GLUCOSE, CAPILLARY
Glucose-Capillary: 136 mg/dL — ABNORMAL HIGH (ref 70–99)
Glucose-Capillary: 94 mg/dL (ref 70–99)
Glucose-Capillary: 92 mg/dL (ref 70–99)
Glucose-Capillary: 107 mg/dL — ABNORMAL HIGH (ref 70–99)

## 2012-03-04 MED ORDER — MIDAZOLAM HCL 2 MG/2ML IJ SOLN
2.0000 mg | INTRAMUSCULAR | Status: DC | PRN
  Administered 2012-03-04 – 2012-03-11 (×5): 2 mg via INTRAVENOUS
  Filled 2012-03-04 (×5): qty 2

## 2012-03-04 MED ORDER — MIDAZOLAM HCL 2 MG/2ML IJ SOLN
2.0000 mg | Freq: Once | INTRAMUSCULAR | Status: AC
  Administered 2012-03-04: 2 mg via INTRAVENOUS

## 2012-03-04 MED ORDER — DEXMEDETOMIDINE HCL IN NACL 400 MCG/100ML IV SOLN
0.2000 ug/kg/h | INTRAVENOUS | Status: DC
  Administered 2012-03-04: 0.7 ug/kg/h via INTRAVENOUS
  Filled 2012-03-04: qty 100

## 2012-03-04 MED ORDER — VECURONIUM BROMIDE 10 MG IV SOLR
5.0000 mg | Freq: Once | INTRAVENOUS | Status: AC
  Administered 2012-03-04: 5 mg via INTRAVENOUS

## 2012-03-04 MED ORDER — SODIUM CHLORIDE 0.9 % IV SOLN
100.0000 ug/h | INTRAVENOUS | Status: DC
  Filled 2012-03-04: qty 50

## 2012-03-04 MED ORDER — ASPIRIN 81 MG PO CHEW
CHEWABLE_TABLET | ORAL | Status: AC
  Administered 2012-03-04: 81 mg via ORAL
  Filled 2012-03-04: qty 1

## 2012-03-04 MED ORDER — FENTANYL BOLUS VIA INFUSION
25.0000 ug | Freq: Four times a day (QID) | INTRAVENOUS | Status: DC | PRN
  Filled 2012-03-04: qty 100

## 2012-03-04 MED ORDER — MIDAZOLAM HCL 2 MG/2ML IJ SOLN
INTRAMUSCULAR | Status: AC
  Filled 2012-03-04: qty 4

## 2012-03-04 MED ORDER — VECURONIUM BROMIDE 10 MG IV SOLR
INTRAVENOUS | Status: AC
  Filled 2012-03-04: qty 10

## 2012-03-04 MED ORDER — PREDNISONE 10 MG PO TABS
10.0000 mg | ORAL_TABLET | Freq: Every day | ORAL | Status: DC
  Administered 2012-03-05: 10 mg via ORAL
  Filled 2012-03-04 (×2): qty 1

## 2012-03-04 MED ORDER — ETOMIDATE 2 MG/ML IV SOLN
INTRAVENOUS | Status: AC
  Administered 2012-03-04: 15 mg via INTRAVENOUS
  Filled 2012-03-04: qty 10

## 2012-03-04 MED ORDER — INSULIN ASPART 100 UNIT/ML ~~LOC~~ SOLN
0.0000 [IU] | SUBCUTANEOUS | Status: DC
  Administered 2012-03-05 (×2): 3 [IU] via SUBCUTANEOUS
  Administered 2012-03-06: 5 [IU] via SUBCUTANEOUS
  Administered 2012-03-06: 3 [IU] via SUBCUTANEOUS
  Administered 2012-03-06: 1 [IU] via SUBCUTANEOUS
  Administered 2012-03-06 – 2012-03-07 (×2): 7 [IU] via SUBCUTANEOUS
  Administered 2012-03-07: 4 [IU] via SUBCUTANEOUS
  Administered 2012-03-07: 3 [IU] via SUBCUTANEOUS
  Administered 2012-03-07: 7 [IU] via SUBCUTANEOUS
  Administered 2012-03-07 – 2012-03-08 (×3): 4 [IU] via SUBCUTANEOUS
  Administered 2012-03-08: 3 [IU] via SUBCUTANEOUS
  Administered 2012-03-08 (×2): 4 [IU] via SUBCUTANEOUS
  Administered 2012-03-08: 11 [IU] via SUBCUTANEOUS
  Administered 2012-03-08 (×2): 4 [IU] via SUBCUTANEOUS
  Administered 2012-03-09: 7 [IU] via SUBCUTANEOUS
  Administered 2012-03-09: 3 [IU] via SUBCUTANEOUS
  Administered 2012-03-09: 7 [IU] via SUBCUTANEOUS
  Administered 2012-03-09: 4 [IU] via SUBCUTANEOUS
  Administered 2012-03-09 – 2012-03-10 (×2): 7 [IU] via SUBCUTANEOUS
  Administered 2012-03-10: 15 [IU] via SUBCUTANEOUS
  Administered 2012-03-10: 7 [IU] via SUBCUTANEOUS
  Administered 2012-03-10: 11 [IU] via SUBCUTANEOUS
  Administered 2012-03-10 (×3): 7 [IU] via SUBCUTANEOUS
  Administered 2012-03-11: 4 [IU] via SUBCUTANEOUS
  Administered 2012-03-11: 7 [IU] via SUBCUTANEOUS
  Administered 2012-03-11: 20 [IU] via SUBCUTANEOUS
  Administered 2012-03-11: 11 [IU] via SUBCUTANEOUS
  Administered 2012-03-12: 4 [IU] via SUBCUTANEOUS
  Administered 2012-03-12 (×3): 7 [IU] via SUBCUTANEOUS

## 2012-03-04 MED ORDER — WHITE PETROLATUM GEL
Status: AC
  Administered 2012-03-04: 17:00:00
  Filled 2012-03-04: qty 5

## 2012-03-04 MED ORDER — FENTANYL CITRATE 0.05 MG/ML IJ SOLN
100.0000 ug | Freq: Once | INTRAMUSCULAR | Status: AC
  Administered 2012-03-04: 100 ug via INTRAVENOUS

## 2012-03-04 MED ORDER — ETOMIDATE 2 MG/ML IV SOLN
15.0000 mg | Freq: Once | INTRAVENOUS | Status: AC
  Administered 2012-03-04: 15 mg via INTRAVENOUS

## 2012-03-04 MED ORDER — OSELTAMIVIR PHOSPHATE 6 MG/ML PO SUSR
75.0000 mg | Freq: Two times a day (BID) | ORAL | Status: AC
  Administered 2012-03-04 – 2012-03-05 (×2): 75 mg via ORAL
  Filled 2012-03-04 (×2): qty 12.5

## 2012-03-04 MED ORDER — FENTANYL CITRATE 0.05 MG/ML IJ SOLN
INTRAMUSCULAR | Status: AC
  Filled 2012-03-04: qty 4

## 2012-03-04 MED ORDER — DEXMEDETOMIDINE HCL IN NACL 200 MCG/50ML IV SOLN
0.2000 ug/kg/h | INTRAVENOUS | Status: AC
  Administered 2012-03-04: 0.4 ug/kg/h via INTRAVENOUS
  Administered 2012-03-04: 0.5 ug/kg/h via INTRAVENOUS
  Administered 2012-03-05 (×2): 0.6 ug/kg/h via INTRAVENOUS
  Administered 2012-03-05: 0.3 ug/kg/h via INTRAVENOUS
  Administered 2012-03-06 – 2012-03-07 (×6): 0.6 ug/kg/h via INTRAVENOUS
  Administered 2012-03-07 (×3): 0.4 ug/kg/h via INTRAVENOUS
  Administered 2012-03-07 (×2): 0.6 ug/kg/h via INTRAVENOUS
  Filled 2012-03-04 (×22): qty 50

## 2012-03-04 MED ORDER — SODIUM CHLORIDE 0.9 % IV SOLN
25.0000 ug/h | INTRAVENOUS | Status: DC
  Administered 2012-03-04 – 2012-03-08 (×3): 100 ug/h via INTRAVENOUS
  Administered 2012-03-09: 75 ug/h via INTRAVENOUS
  Administered 2012-03-10 (×2): 100 ug/h via INTRAVENOUS
  Filled 2012-03-04 (×7): qty 50

## 2012-03-04 NOTE — Progress Notes (Signed)
CRITICAL VALUE ALERT  Critical value received:  ABG result pH 7.18, CO2 86.7  Date of notification:  03/04/12  Time of notification:  2222  Critical value read back:yes  Nurse who received alert:  Crist Fat RN  MD notified (1st page):  Dr. Marchelle Gearing  Time of first page:  2230  MD notified (2nd page):Ramaswamy  Time of second page:NA  Responding MD:  Marchelle Gearing  Time MD responded:  2230

## 2012-03-04 NOTE — Progress Notes (Signed)
Patient sedated on precedex, receiving demadex. Need for for with intention of evaluation for continued need discussed with Dr Kendrick Fries. Order placed

## 2012-03-04 NOTE — Progress Notes (Signed)
eLink Physician-Brief Progress Note Patient Name: [name] DOB: [birth year] MRN: [id number]  Date of Service  03/04/2012   HPI/Events of Note    Lab 03/04/12 2010 03/04/12 1734 03/03/12 2301 03/03/12 1853 03/02/12 0515  PHART 7.194* 7.256* 7.239* 7.251* 7.218*  PCO2ART 85.7* 71.6* 79.7* 74.7* 78.1*  PO2ART 139.0* 71.0* 82.0 84.0 102.0*  HCO3 31.8* 32.2* 34.3* 32.9* 30.6*  TCO2 34.4 34 37 35 33.0  O2SAT 99.5 91.0 93.0 94.0 96.6     eICU Interventions  Post intubation abg is worse. ET tube in good place per xr report. Patient agitated  Plan ADd precedex to fent gtt remeasure height and ensure Vt is at 8cc/kg/IBW Recheck ABG after above to decide on further vent changes For  Now continue vent seting of Vt 470 and RR 18   Intervention Category Major Interventions: Acid-Base disturbance - evaluation and management Intermediate Interventions: Diagnostic test evaluation;Communication with other healthcare providers and/or family  Cortez Flippen 03/04/2012, 8:25 PM

## 2012-03-04 NOTE — Procedures (Signed)
Intubation Procedure Note [name] [id number] 1956-02-01  Procedure: Intubation Indications: Respiratory insufficiency  Procedure Details Consent: Risks of procedure as well as the alternatives and risks of each were explained to the (patient/caregiver).  Consent for procedure obtained. Time Out: Verified patient identification, verified procedure, site/side was marked, verified correct patient position, special equipment/implants available, medications/allergies/relevent history reviewed, required imaging and test results available.  Performed  Maximum sterile technique was used including gown and hand hygiene.  MAC and 3    Evaluation Hemodynamic Status: BP stable throughout; O2 sats: stable throughout Patient's Current Condition: stable Complications: No apparent complications Patient did tolerate procedure well. Chest X-ray ordered to verify placement.  CXR: pending.   Nelda Bucks 03/04/2012

## 2012-03-04 NOTE — Procedures (Signed)
Arterial Catheter Insertion Procedure Note [name] [id number] [birth year]  Procedure: Insertion of Arterial Catheter  Indications: Blood pressure monitoring and Frequent blood sampling  Procedure Details Consent: Unable to obtain consent because of altered level of consciousness. Time Out: Verified patient identification, verified procedure, site/side was marked, verified correct patient position, special equipment/implants available, medications/allergies/relevent history reviewed, required imaging and test results available.  Performed  Maximum sterile technique was used including antiseptics, cap, gloves, gown, hand hygiene, mask and sheet. Skin prep: Chlorhexidine; local anesthetic administered 22 gauge catheter was inserted into right radial artery using the Seldinger technique.  Evaluation Blood flow good; BP tracing good. Complications: No apparent complications.   [name] 03/04/2012

## 2012-03-04 NOTE — Progress Notes (Signed)
PULMONARY  / CRITICAL CARE MEDICINE  Name: NICK ARMEL MRN: [id number] DOB: 10-Dec-1955    LOS: 4  REFERRING MD :  Janee Morn   CHIEF COMPLAINT:  AECOPD and hypotension   BRIEF PATIENT DESCRIPTION:  56 yo female smoker admitted on 02/29/2012 with dyspnea, wheeze, cough 2nd to AECOPD with Flu A/H1N1 and Pneumococcus.  Previously followed by Dr. Vassie Loll as outpt.  Significant PMHx of COPD on home oxygen, GERD, Hyperlipidemia, Chronic pain, HTN, CAD, DM  LINES / TUBES: PIV  CULTURES: Influenza pcr 12/27>>>POSITIVE (Flu A/H1N1) u strep 12/27>>>POSITIVE u legionella 12/27>>>negative Urine culture 12/27>>>negative  ANTIBIOTICS: levaquin 12/27>>> tamiflu 12/27>>>  SIGNIFICANT EVENTS:  12/27 Admit ICU requiring BiPAP  DVT Px:  [place] heparin  GI Px:  PPI   INTERVAL HISTORY:  Still requiring BiPAP frequently.  VITAL SIGNS: Temp:  [96.7 F (35.9 C)-100.7 F (38.2 C)] 97 F (36.1 C) (12/31 0800) Pulse Rate:  [62-117] 75  (12/31 1147) Resp:  [15-28] 20  (12/31 1147) BP: (84-142)/(55-95) 118/75 mmHg (12/31 1147) SpO2:  [72 %-100 %] 94 % (12/31 1147) FiO2 (%):  [40 %-50 %] 40 % (12/31 1147) Weight:  [248 lb 14.4 oz (112.9 kg)] 248 lb 14.4 oz (112.9 kg) (12/31 0429) HEMODYNAMICS:   VENTILATOR SETTINGS: Vent Mode:  [-]  FiO2 (%):  [40 %-50 %] 40 % INTAKE / OUTPUT: Intake/Output      12/30 0701 - 12/31 0700 12/31 0701 - 01/01 0700   P.O.     I.V. (mL/kg) 1280.9 (11.Sherry) 200 (1.8)   IV Piggyback     Total Intake(mL/kg) 1280.9 (11.Sherry) 200 (1.8)   Urine (mL/kg/hr) 1705 (0.6) 230 (0.4)   Total Output 1705 230   Net -424.1 -30          PHYSICAL EXAMINATION: General: No distress, sitting in chair Neuro: Calm, appropriate, follows commands HEENT: BiPAP mask in place Cardiovascular:  s1s2 regular, no murmur Lungs: Decreased breath sounds, no wheeze Abdomen:  Soft, non tender Musculoskeletal: no edema Skin: no rashes   LABS: Cbc  Lab 03/03/12 0420 03/02/12 0430 03/01/12  0620  WBC 7.6 -- --  HGB 14.7 13.4 13.8  HCT 47.5* 44.2 43.1  PLT 172 201 185    Chemistry   Lab 03/04/12 0410 03/03/12 0420 03/02/12 1222 03/01/12 0620  NA 137 137 139 --  K 5.0 4.0 Sherry.8 --  CL 103 98 98 --  CO2 28 30 34* --  BUN 18 19 23  --  CREATININE 0.71 0.78 0.93 --  CALCIUM 7.6* 8.0* 8.0* --  MG -- -- -- 1.5  PHOS -- -- -- Sherry.0  GLUCOSE 144* 126* 112* --    Liver fxn  Lab 03/04/12 0410 03/03/12 0420  AST 122* 228*  ALT 135* 202*  ALKPHOS 44 58  BILITOT 0.2* 0.2*  PROT 5.7* 6.8  ALBUMIN 2.4* Sherry.2*    BNP  Lab 02/29/12 1119  PROBNP 1012.0*   ABG  Lab 03/03/12 2301 03/03/12 1853 03/02/12 0515  PHART 7.239* 7.251* 7.218*  PCO2ART 79.7* 74.7* 78.1*  PO2ART 82.0 84.0 102.0*  HCO3 34.Sherry* 32.9* 30.6*  TCO2 37 35 33.0    CBG trend  Lab 03/04/12 0758 03/03/12 1953 03/03/12 1543 03/03/12 1207 03/03/12 0806  GLUCAP 136* 215* 196* 196* 155*    IMAGING: No results found.   ASSESSMENT / PLAN:  PULMONARY  ASSESSMENT: Acute on chronic respiratory failure 2nd to AECOPD with postive H1N1 and Pneumococcal Ag. Likely also has OSA/OHS. PLAN:   BiPAP qhs and prn  during day F/u CXR intermittently Change Prednisone to 10 mg daily, and wean off as tolerated Continue scheduled BD's  CARDIOVASCULAR  ASSESSMENT:  Hx of HTN, CAD, Hyperlipidemia. PLAN:  KVO IV fluids Restarted lopressor, imdur, ASA Hold benicar for now  RENAL  ASSESSMENT:   Metabolic alkalosis 2nd to respiratory acidosis. Given diamox 12/29. PLAN:   Monitor renal fx, urine outpt, electrolytes  GASTROINTESTINAL  ASSESSMENT:   Nutrition. Elevated LFT's. PLAN:   Modified CHO diet F/u LFT's intermittently PPI  HEMATOLOGIC  ASSESSMENT:   No acute issue. PLAN:  F/u cbc intermittently  INFECTIOUS  ASSESSMENT:   H1N1, Pneumococcal Ag positive. PLAN:   D5/x levaquin, tamiflu  ENDOCRINE  ASSESSMENT:   DM type II with hyperglycemia  PLAN:   SSI  NEUROLOGIC  ASSESSMENT:    Acute encephalopathy 2nd to hypercapnia/hypoxia. Chronic pain. PLAN:   Avoid benzo's, narcotics if possible   Critical care time 35 minutes.  Coralyn Helling, MD [facility] Pulmonary/Critical Care 03/04/2012, 12:16 PM Pager:  [phone] After 3pm call: [phone]

## 2012-03-04 NOTE — Progress Notes (Signed)
eLink Physician-Brief Progress Note Patient Name: [name] DOB: 03/21/1955 MRN: [id number]  Date of Service  03/04/2012   HPI/Events of Note    Lab 03/04/12 2222 03/04/12 2010 03/04/12 1734 03/03/12 2301 03/03/12 1853  PHART 7.188* 7.194* 7.256* 7.239* 7.251*  PCO2ART 85.2* 85.7* 71.6* 79.7* 74.7*  PO2ART 61.0* 139.0* 71.0* 82.0 84.0  HCO3 32.6* 31.8* 32.2* 34.3* 32.9*  TCO2 35 34.4 34 37 35  O2SAT 84.0 99.5 91.0 93.0 94.0     eICU Interventions  abg no better  Plan Increase RR to 24 and Vt 540 and reassess   Intervention Category Major Interventions: Acid-Base disturbance - evaluation and management  Mayley Lish 03/04/2012, 10:57 PM

## 2012-03-04 NOTE — Progress Notes (Signed)
eLink Physician-Brief Progress Note Patient Name: ANTONELA FREIMAN DOB: 1955-11-28 MRN: [id number]  Date of Service  03/04/2012   HPI/Events of Note   RN concernred patient lethargic  On camera exam - sitting in chair and the moved to bed RASS appears to be -1 on camera  eICU Interventions  Stat abg   Intervention Category Major Interventions: Other:  Eilidh Marcano 03/04/2012, 5:22 PM

## 2012-03-04 NOTE — Progress Notes (Signed)
When checking on patient at 1700, pt very lethargic and difficult to arouse. Marchelle Gearing, MD notified and ordered ABG. Pt placed back in bed. Tyson Alias, MD consulted for possible intubation

## 2012-03-05 ENCOUNTER — Inpatient Hospital Stay (HOSPITAL_COMMUNITY): Payer: Medicaid Other

## 2012-03-05 DIAGNOSIS — J111 Influenza due to unidentified influenza virus with other respiratory manifestations: Secondary | ICD-10-CM

## 2012-03-05 DIAGNOSIS — J449 Chronic obstructive pulmonary disease, unspecified: Secondary | ICD-10-CM

## 2012-03-05 LAB — GLUCOSE, CAPILLARY
Glucose-Capillary: 107 mg/dL — ABNORMAL HIGH (ref 70–99)
Glucose-Capillary: 113 mg/dL — ABNORMAL HIGH (ref 70–99)
Glucose-Capillary: 114 mg/dL — ABNORMAL HIGH (ref 70–99)
Glucose-Capillary: 97 mg/dL (ref 70–99)

## 2012-03-05 LAB — URINE CULTURE
Colony Count: NO GROWTH
Culture: NO GROWTH
Special Requests: NORMAL

## 2012-03-05 LAB — POCT I-STAT 3, ART BLOOD GAS (G3+)
Acid-Base Excess: 4 mmol/L — ABNORMAL HIGH (ref 0.0–2.0)
Acid-Base Excess: 2 mmol/L (ref 0.0–2.0)
Bicarbonate: 29.4 mEq/L — ABNORMAL HIGH (ref 20.0–24.0)
O2 Saturation: 91 %
Patient temperature: 98
TCO2: 33 mmol/L (ref 0–100)
pCO2 arterial: 60.7 mmHg (ref 35.0–45.0)
pCO2 arterial: 57.8 mmHg (ref 35.0–45.0)
pH, Arterial: 7.314 — ABNORMAL LOW (ref 7.350–7.450)
pO2, Arterial: 80 mmHg (ref 80.0–100.0)

## 2012-03-05 LAB — BASIC METABOLIC PANEL
Calcium: 7.9 mg/dL — ABNORMAL LOW (ref 8.4–10.5)
GFR calc Af Amer: >90 mL/min (ref >90)
GFR calc non Af Amer: >90 mL/min (ref >90)
Potassium: 3.4 mEq/L — ABNORMAL LOW (ref 3.5–5.1)
Sodium: 143 mEq/L (ref 135–145)

## 2012-03-05 LAB — CBC
Hemoglobin: 12.4 g/dL (ref 12.0–15.0)
MCH: 30.2 pg (ref 26.0–34.0)
Platelets: 174 10*3/uL (ref 150–400)
RBC: 4.1 MIL/uL (ref 3.87–5.11)

## 2012-03-05 MED ORDER — IPRATROPIUM-ALBUTEROL 18-103 MCG/ACT IN AERO
4.0000 | INHALATION_SPRAY | Freq: Four times a day (QID) | RESPIRATORY_TRACT | Status: DC
  Administered 2012-03-05 – 2012-03-12 (×29): 4 via RESPIRATORY_TRACT
  Filled 2012-03-05: qty 14.7

## 2012-03-05 MED ORDER — ETOMIDATE 2 MG/ML IV SOLN
20.0000 mg | Freq: Once | INTRAVENOUS | Status: AC
  Administered 2012-03-05: 20 mg via INTRAVENOUS

## 2012-03-05 MED ORDER — BIOTENE DRY MOUTH MT LIQD
15.0000 mL | Freq: Four times a day (QID) | OROMUCOSAL | Status: DC
  Administered 2012-03-05 – 2012-03-12 (×31): 15 mL via OROMUCOSAL

## 2012-03-05 MED ORDER — POTASSIUM CHLORIDE 20 MEQ/15ML (10%) PO LIQD
20.0000 meq | ORAL | Status: AC
  Administered 2012-03-05 (×2): 20 meq
  Filled 2012-03-05 (×2): qty 15

## 2012-03-05 MED ORDER — METOPROLOL TARTRATE 25 MG/10 ML ORAL SUSPENSION
25.0000 mg | Freq: Two times a day (BID) | ORAL | Status: DC
  Administered 2012-03-05: 25 mg
  Filled 2012-03-05 (×2): qty 10

## 2012-03-05 MED ORDER — PREDNISONE 5 MG/5ML PO SOLN
10.0000 mg | Freq: Every day | ORAL | Status: DC
  Administered 2012-03-06 – 2012-03-09 (×4): 10 mg
  Filled 2012-03-05 (×5): qty 10

## 2012-03-05 MED ORDER — ROCURONIUM BROMIDE 50 MG/5ML IV SOLN
50.0000 mg | Freq: Once | INTRAVENOUS | Status: AC
  Administered 2012-03-05: 50 mg via INTRAVENOUS

## 2012-03-05 MED ORDER — IPRATROPIUM-ALBUTEROL 18-103 MCG/ACT IN AERO
4.0000 | INHALATION_SPRAY | RESPIRATORY_TRACT | Status: DC | PRN
  Filled 2012-03-05: qty 14.7

## 2012-03-05 MED ORDER — PANTOPRAZOLE SODIUM 40 MG PO PACK
40.0000 mg | PACK | ORAL | Status: DC
  Administered 2012-03-05 – 2012-03-12 (×8): 40 mg
  Filled 2012-03-05 (×8): qty 20

## 2012-03-05 MED ORDER — LEVOFLOXACIN 25 MG/ML PO SOLN
500.0000 mg | Freq: Every day | ORAL | Status: DC
  Administered 2012-03-05 – 2012-03-07 (×3): 500 mg
  Filled 2012-03-05 (×4): qty 20

## 2012-03-05 NOTE — Progress Notes (Signed)
PULMONARY  / CRITICAL CARE MEDICINE  Name: [name] MRN: [id number] DOB: 1956-02-11    LOS: 5  REFERRING MD :  Janee Morn   CHIEF COMPLAINT:  AECOPD and hypotension   BRIEF PATIENT DESCRIPTION:  57 yo female smoker admitted on 02/29/2012 with dyspnea, wheeze, cough 2nd to AECOPD with Flu A/H1N1 and Pneumococcus.  Previously followed by Dr. Vassie Loll as outpt.  Significant PMHx of COPD on home oxygen, GERD, Hyperlipidemia, Chronic pain, HTN, CAD, DM  LINES / TUBES: PIV ETT 12/31>> Rt radial aline 1/01>>  CULTURES: Influenza pcr 12/27>>>POSITIVE (Flu A/H1N1) u strep 12/27>>>POSITIVE u legionella 12/27>>>negative Urine culture 12/27>>>negative  ANTIBIOTICS: levaquin 12/27>>> tamiflu 12/27>>>  SIGNIFICANT EVENTS:  12/27 Admit ICU requiring BiPAP  DVT Px:  [place] heparin  GI Px:  PPI   INTERVAL HISTORY:  Intubated overnight.  VITAL SIGNS: Temp:  [97 F (36.1 C)-99 F (37.2 C)] 98.7 F (37.1 C) (01/01 0747) Pulse Rate:  [54-99] 71  (01/01 0934) Resp:  [16-28] 24  (01/01 0800) BP: (86-164)/(47-94) 100/66 mmHg (01/01 0934) SpO2:  [90 %-100 %] 93 % (01/01 0800) Arterial Line BP: (69-157)/(44-90) 69/51 mmHg (01/01 0800) FiO2 (%):  [0.4 %-100 %] 40 % (01/01 0800) Weight:  [250 lb 3.6 oz (113.5 kg)] 250 lb 3.6 oz (113.5 kg) (01/01 0400) HEMODYNAMICS:   VENTILATOR SETTINGS: Vent Mode:  [-] PRVC FiO2 (%):  [0.4 %-100 %] 40 % Set Rate:  [18 bmp-24 bmp] 24 bmp Vt Set:  [470 mL-550 mL] 470 mL PEEP:  [5 cmH20] 5 cmH20 Plateau Pressure:  [23 cmH20-28 cmH20] 26 cmH20 INTAKE / OUTPUT: Intake/Output      12/31 0701 - 01/01 0700 01/01 0701 - 01/02 0700   I.V. (mL/kg) 1365.7 (12) 68.5 (0.6)   NG/GT 40 35   Total Intake(mL/kg) 1405.7 (12.4) 103.5 (0.9)   Urine (mL/kg/hr) 1190 (0.4) 60   Emesis/NG output 60    Total Output 1250 60   Net +155.7 +43.5          PHYSICAL EXAMINATION: General: No distress Neuro: Sedated, calm, appropriate, follows commands HEENT: ETT in  place Cardiovascular:  s1s2 regular, no murmur Lungs: Decreased breath sounds, no wheeze Abdomen:  Soft, non tender Musculoskeletal: no edema Skin: no rashes   LABS: Cbc  Lab 03/05/12 0335 03/03/12 0420 03/02/12 0430  WBC 4.6 -- --  HGB 12.4 14.7 13.4  HCT 39.8 47.5* 44.2  PLT 174 172 201    Chemistry   Lab 03/05/12 0335 03/04/12 0410 03/03/12 0420 03/01/12 0620  NA 143 137 137 --  K 3.4* 5.0 4.0 --  CL 105 103 98 --  CO2 32 28 30 --  BUN 19 18 19  --  CREATININE 0.77 0.71 0.78 --  CALCIUM 7.9* 7.6* 8.0* --  MG -- -- -- 1.5  PHOS -- -- -- 3.0  GLUCOSE 108* 144* 126* --    Liver fxn  Lab 03/04/12 0410 03/03/12 0420  AST 122* 228*  ALT 135* 202*  ALKPHOS 44 58  BILITOT 0.2* 0.2*  PROT 5.7* 6.8  ALBUMIN 2.4* 3.2*    BNP  Lab 02/29/12 1119  PROBNP 1012.0*   ABG  Lab 03/05/12 0345 03/04/12 2222 03/04/12 2010  PHART 7.318* 7.188* 7.194*  PCO2ART 60.7* 85.2* 85.7*  PO2ART 67.0* 61.0* 139.0*  HCO3 31.3* 32.6* 31.8*  TCO2 33 35 34.4    CBG trend  Lab 03/05/12 0708 03/05/12 0341 03/04/12 2343 03/04/12 1953 03/04/12 1714  GLUCAP 97 113* 107* 107* 92  IMAGING: Dg Chest Port 1 View  03/05/2012  *RADIOLOGY REPORT*  Clinical Data: Follow up of pneumonia.  Hypertension.  Diabetes.  PORTABLE CHEST - 1 VIEW  Comparison: 1 day prior  Findings: Endotracheal tube 2.9 cm above carina.  Nasogastric tube extends beyond the  inferior aspect of the film.  Cardiomegaly accentuated by AP portable technique.  Cannot exclude small bilateral pleural effusions. No pneumothorax.  Low lung volumes. Decreased left and similar right base air space disease.  IMPRESSION: Persistent low lung volumes with minimal improvement in left base aeration.  Otherwise, similar bibasilar atelectasis versus infection.   Original Report Authenticated By: Jeronimo Greaves, M.D.    Dg Chest Port 1 View  03/04/2012  *RADIOLOGY REPORT*  Clinical Data: Status post intubation.  PORTABLE CHEST - 1 VIEW   Comparison: 03/02/2012  Findings: 1824 hours.  Endotracheal tube tip is 2.8 cm above the base of the carina.  There is bibasilar atelectasis/infiltrate, left greater than right. The cardiopericardial silhouette is enlarged. Telemetry leads overlie the chest.  IMPRESSION: Endotracheal tube tip is approximately 3 cm above the base of the carina.  Bibasilar atelectasis or infiltrate, left greater than right.   Original Report Authenticated By: Kennith Center, M.D.      ASSESSMENT / PLAN:  PULMONARY  ASSESSMENT: Acute on chronic respiratory failure 2nd to AECOPD with postive H1N1 and Pneumococcal Ag. Likely also has OSA/OHS. PLAN:   Full vent support for now Adjust RR, Vt to avoid PEEPi >> may need to allow for permissive hypercapnia F/u CXR , ABG Continue Prednisone to 10 mg daily for now Continue scheduled BD's  CARDIOVASCULAR  ASSESSMENT:  Hx of HTN, CAD, Hyperlipidemia. PLAN:  KVO IV fluids Continue lopressor, imdur, ASA Hold benicar for now  RENAL  ASSESSMENT:   Metabolic alkalosis 2nd to respiratory acidosis. Given diamox 12/29. PLAN:   Monitor renal fx, urine outpt, electrolytes  GASTROINTESTINAL  ASSESSMENT:   Nutrition. Elevated LFT's. PLAN:   Tube feeds while on vent F/u LFT's intermittently PPI  HEMATOLOGIC  ASSESSMENT:   No acute issue. PLAN:  F/u cbc intermittently  INFECTIOUS  ASSESSMENT:   H1N1, Pneumococcal Ag positive. PLAN:   D6/x levaquin, tamiflu  ENDOCRINE  ASSESSMENT:   DM type II with hyperglycemia  PLAN:   SSI  NEUROLOGIC  ASSESSMENT:   Acute encephalopathy 2nd to hypercapnia/hypoxia. Chronic pain. PLAN:   Precedex while on vent  Summary: Concern is that she will need trach at some point.  Critical care time 35 minutes.  Coralyn Helling, MD [facility] Pulmonary/Critical Care 03/05/2012, 10:11 AM Pager:  [phone] After 3pm call: [phone]

## 2012-03-05 NOTE — Progress Notes (Signed)
[facility] ADULT ICU REPLACEMENT PROTOCOL FOR AM LAB REPLACEMENT ONLY  The patient does apply for the [facility] Adult ICU Electrolyte Replacment Protocol based on the criteria listed below:   1. Is GFR >/= 50 ml/min? yes  Patient's GFR today is >90 2. Is urine output >/= 0.5 ml/kg/hr for the last 8 hours? yes Patient's UOP is 0.9 ml/kg/hr 3. Is BUN < 30 mg/dL? yes  Patient's BUN today is 19 4. Abnormal electrolyte(s):K3.4 5. Ordered repletion with:   Melrose Nakayama 03/05/2012 5:39 AM

## 2012-03-05 NOTE — Progress Notes (Signed)
eLink Physician-Brief Progress Note Patient Name: ELLIANNA RUEST DOB: August 04, 1955 MRN: [id number]  Date of Service  03/05/2012   HPI/Events of Note  Relative brady  eICU Interventions  Dc metoprolol   Intervention Category Minor Interventions: Clinical assessment - ordering diagnostic tests  Nelda Bucks. 03/05/2012, 10:19 PM

## 2012-03-05 NOTE — Procedures (Signed)
Intubation Procedure Note [name] [id number] 29-Dec-1955  Procedure: Intubation Indications: ETT needed to be changed  Procedure Details Consent: Unable to obtain consent because of emergent medical necessity. Time Out: Verified patient identification, verified procedure, site/side was marked, verified correct patient position, special equipment/implants available, medications/allergies/relevent history reviewed, required imaging and test results available.  Performed  Drugs etomidate 20mg  IV, rocuronium 50mg  IV DL x 1 with MAC 3 blade Grade 2 view 7.5 tube passed through cords under direct visualization over a tube exchanger device Placement confirmed with bilateral breath sounds, positive EtCO2 change and smoke in tube   Evaluation Hemodynamic Status: BP stable throughout; O2 sats: stable throughout Patient's Current Condition: stable Complications: No apparent complications Patient did tolerate procedure well. Chest X-ray ordered to verify placement.  CXR: pending.   [name] 03/05/2012

## 2012-03-06 ENCOUNTER — Inpatient Hospital Stay (HOSPITAL_COMMUNITY): Payer: Medicaid Other

## 2012-03-06 LAB — GLUCOSE, CAPILLARY
Glucose-Capillary: 113 mg/dL — ABNORMAL HIGH (ref 70–99)
Glucose-Capillary: 178 mg/dL — ABNORMAL HIGH (ref 70–99)

## 2012-03-06 LAB — BLOOD GAS, ARTERIAL
Acid-Base Excess: 5.9 mmol/L — ABNORMAL HIGH (ref 0.0–2.0)
Drawn by: 36277
FIO2: 0.4 %
RATE: 16 resp/min
pCO2 arterial: 61.9 mmHg (ref 35.0–45.0)
pO2, Arterial: 72.8 mmHg — ABNORMAL LOW (ref 80.0–100.0)

## 2012-03-06 LAB — BASIC METABOLIC PANEL
BUN: 18 mg/dL (ref 6–23)
Calcium: 8.1 mg/dL — ABNORMAL LOW (ref 8.4–10.5)
Creatinine, Ser: 0.69 mg/dL (ref 0.50–1.10)
GFR calc non Af Amer: >90 mL/min (ref >90)
Glucose, Bld: 114 mg/dL — ABNORMAL HIGH (ref 70–99)

## 2012-03-06 LAB — POCT I-STAT 3, ART BLOOD GAS (G3+)
Bicarbonate: 31.9 mEq/L — ABNORMAL HIGH (ref 20.0–24.0)
TCO2: 34 mmol/L (ref 0–100)
pCO2 arterial: 55.5 mmHg — ABNORMAL HIGH (ref 35.0–45.0)
pH, Arterial: 7.37 (ref 7.350–7.450)

## 2012-03-06 LAB — CBC
Hemoglobin: 12 g/dL (ref 12.0–15.0)
MCH: 28.9 pg (ref 26.0–34.0)
MCHC: 30.2 g/dL (ref 30.0–36.0)

## 2012-03-06 MED ORDER — ADULT MULTIVITAMIN LIQUID CH
5.0000 mL | Freq: Every day | ORAL | Status: DC
  Administered 2012-03-06 – 2012-03-12 (×7): 5 mL
  Filled 2012-03-06 (×7): qty 5

## 2012-03-06 MED ORDER — METOPROLOL TARTRATE 25 MG/10 ML ORAL SUSPENSION
12.5000 mg | Freq: Two times a day (BID) | ORAL | Status: DC
  Administered 2012-03-06 – 2012-03-07 (×4): 12.5 mg
  Filled 2012-03-06 (×6): qty 5

## 2012-03-06 MED ORDER — PRO-STAT SUGAR FREE PO LIQD
60.0000 mL | Freq: Four times a day (QID) | ORAL | Status: DC
  Administered 2012-03-06 – 2012-03-12 (×24): 60 mL
  Filled 2012-03-06 (×28): qty 60

## 2012-03-06 MED ORDER — OXEPA PO LIQD
1000.0000 mL | ORAL | Status: DC
  Administered 2012-03-06 – 2012-03-12 (×6): 1000 mL
  Filled 2012-03-06 (×9): qty 1000

## 2012-03-06 MED ORDER — FUROSEMIDE 10 MG/ML IJ SOLN
40.0000 mg | Freq: Two times a day (BID) | INTRAMUSCULAR | Status: AC
  Administered 2012-03-06 (×2): 40 mg via INTRAVENOUS
  Filled 2012-03-06 (×2): qty 4

## 2012-03-06 NOTE — Progress Notes (Signed)
  Echocardiogram 2D Echocardiogram has been performed.  [name] FRANCES 03/06/2012, 12:55 PM

## 2012-03-06 NOTE — Progress Notes (Signed)
INITIAL NUTRITION ASSESSMENT  DOCUMENTATION CODES Per approved criteria  -Morbid Obesity   INTERVENTION:  Initiate TF via OG tube with Oxepa at 15 ml/h, goal rate, with Prostat 60 ml QID to provide 1340 kcals (23 kcals/kg ideal weight), 143 gm protein (99% estimated needs), 283 ml free water daily.  Liquid MVI via tube daily to help meet 100% DRI's.  NUTRITION DIAGNOSIS: Inadequate oral intake related to inability to eat as evidenced by NPO status.   Goal: Enteral nutrition to provide 60-70% of estimated calorie needs (22-25 kcals/kg ideal body weight) and >/= 90% of estimated protein needs, based on ASPEN guidelines for permissive underfeeding in critically ill obese individuals.  Monitor:  TF tolerance/adequacy, weight trend, labs, I/O, vent status  Reason for Assessment: MD Consult for TF initiation and management.  57 y.o. female  Admitting Dx: Acute-on-chronic respiratory failure  ASSESSMENT: Patient was admitted on 02/29/2012 with dyspnea, wheeze, cough due to AECOPD with Flu A/H1N1 and pneumococcus.  Patient is currently intubated on ventilator support.  MV: 8.9 Temp:Temp (24hrs), Avg:98.5 F (36.9 C), Min:97.3 F (36.3 C), Max:100.1 F (37.8 C)   Height: Ht Readings from Last 1 Encounters:  03/01/12 5' 5.5" (1.664 m)    Weight: Wt Readings from Last 1 Encounters:  03/06/12 249 lb 1.9 oz (113 kg)    Ideal Body Weight: 58 kg  % Ideal Body Weight: 195%  Wt Readings from Last 10 Encounters:  03/06/12 249 lb 1.9 oz (113 kg)  07/07/10 252 lb 3.2 oz (114.397 kg)    Usual Body Weight: 252 lb  % Usual Body Weight: 99%  BMI:  Body mass index is 40.83 kg/(m^2).  Class 3, extreme obesity  Estimated Nutritional Needs: Kcal: 2050 Protein: > 144 gm Fluid: 2-2.3 L  Skin: no problems noted  Diet Order:  NPO  EDUCATION NEEDS: -Education not appropriate at this time   Intake/Output Summary (Last 24 hours) at 03/06/12 0944 Last data filed at 03/06/12  0900  Gross per 24 hour  Intake 1918.8 ml  Output   1070 ml  Net  848.8 ml    Last BM: 1/1   Labs:   Lab 03/06/12 0455 03/05/12 0335 03/04/12 0410 03/01/12 0620  NA 146* 143 137 --  K 4.2 3.4* 5.0 --  CL 109 105 103 --  CO2 29 32 28 --  BUN 18 19 18  --  CREATININE 0.69 0.77 0.71 --  CALCIUM 8.1* 7.9* 7.6* --  MG -- -- -- 1.5  PHOS -- -- -- 3.0  GLUCOSE 114* 108* 144* --    CBG (last 3)   Basename 03/06/12 0827 03/06/12 0430 03/05/12 2346  GLUCAP 125* 111* 113*    Scheduled Meds:   . albuterol-ipratropium  4 puff Inhalation Q6H  . antiseptic oral rinse  15 mL Mouth Rinse QID  . aspirin  81 mg Oral Daily  . chlorhexidine  15 mL Mouth Rinse BID  . heparin subcutaneous  5,000 Units Subcutaneous Q8H  . insulin aspart  0-20 Units Subcutaneous Q4H  . insulin glargine  10 Units Subcutaneous Daily  . isosorbide mononitrate  30 mg Oral Daily  . levofloxacin  500 mg Per Tube Daily  . pantoprazole sodium  40 mg Per Tube Q24H  . predniSONE  10 mg Per Tube Q breakfast    Continuous Infusions:   . sodium chloride 50 mL/hr at 03/03/12 2049  . dexmedetomidine 0.6 mcg/kg/hr (03/06/12 0604)  . fentaNYL infusion INTRAVENOUS 100 mcg/hr (03/04/12 1918)    Past  Medical History  Diagnosis Date  . COPD (chronic obstructive pulmonary disease)   . Respiratory failure   . Morbid obesity   . Tobacco abuse   . GERD (gastroesophageal reflux disease)   . Hyperlipidemia   . Chronic pain   . Hypertension   . Heart attack   . Arthritis   . Allergic rhinitis   . Diabetes mellitus, type 2   . Diabetes mellitus type 2 in obese 02/29/2012  . HTN (hypertension) 02/29/2012    Past Surgical History  Procedure Date  . Cholecystectomy   . Partial hysterectomy   . Angioplasty   . Umbilical hernia repair 12/2009    Joaquin Courts, RD, LDN, CNSC Pager# [phone] After Hours Pager# [phone]

## 2012-03-06 NOTE — Progress Notes (Signed)
CRITICAL VALUE ALERT  Critical value received:  CO2 on ABG 61.9  Date of notification:  03/06/12  Time of notification:  0540  Critical value read back:yes  Nurse who received alert:  Crist Fat RN  MD notified (1st page):  Dr. Tyson Alias  Time of first page:  0541  MD notified (2nd page):NA  Time of second page:NA  Responding MD:  feinstein  Time MD responded:  [phone]

## 2012-03-06 NOTE — Progress Notes (Signed)
PULMONARY  / CRITICAL CARE MEDICINE  Name: [name] MRN: [id number] DOB: 12-13-1955    LOS: 6  REFERRING MD :  Janee Morn   CHIEF COMPLAINT:  AECOPD and hypotension   BRIEF PATIENT DESCRIPTION:  57 yo female smoker admitted on 02/29/2012 with dyspnea, wheeze, cough 2nd to AECOPD with Flu A/H1N1 and Pneumococcus.  Previously followed by Dr. Vassie Loll as outpt.  Significant PMHx of COPD on home oxygen, GERD, Hyperlipidemia, Chronic pain, HTN, CAD, DM  LINES / TUBES: PIV ETT 12/31>> Rt radial aline 1/01>>  CULTURES: Influenza pcr 12/27>>>POSITIVE (Flu A/H1N1) u strep 12/27>>>POSITIVE u legionella 12/27>>>negative Urine culture 12/27>>>negative  ANTIBIOTICS: levaquin 12/27>>> tamiflu 12/27>>>  SIGNIFICANT EVENTS:  12/27 Admit ICU requiring BiPAP  DVT Px:  [place] heparin  GI Px:  PPI   INTERVAL HISTORY:  Unable to tolerate SBT.  VITAL SIGNS: Temp:  [97.3 F (36.3 C)-100.1 F (37.8 C)] 99.4 F (37.4 C) (01/02 0832) Pulse Rate:  [54-102] 96  (01/02 0900) Resp:  [11-26] 15  (01/02 0900) BP: (84-139)/(52-95) 139/89 mmHg (01/02 0900) SpO2:  [60 %-98 %] 94 % (01/02 0900) Arterial Line BP: (89-122)/(58-85) 103/61 mmHg (01/02 0300) FiO2 (%):  [40 %] 40 % (01/02 0915) Weight:  [249 lb 1.9 oz (113 kg)] 249 lb 1.9 oz (113 kg) (01/02 0500) HEMODYNAMICS:   VENTILATOR SETTINGS: Vent Mode:  [-] PRVC FiO2 (%):  [40 %] 40 % Set Rate:  [16 bmp] 16 bmp Vt Set:  [500 mL] 500 mL PEEP:  [5 cmH20] 5 cmH20 Pressure Support:  [10 cmH20] 10 cmH20 Plateau Pressure:  [14 cmH20-22 cmH20] 22 cmH20 INTAKE / OUTPUT: Intake/Output      01/01 0701 - 01/02 0700 01/02 0701 - 01/03 0700   I.V. (mL/kg) 1812 (16) 153.8 (1.4)   NG/GT 125    Total Intake(mL/kg) 1937 (17.1) 153.8 (1.4)   Urine (mL/kg/hr) 980 (0.4) 150   Emesis/NG output     Total Output 980 150   Net +957 +3.8          PHYSICAL EXAMINATION: General: No distress Neuro: Sedated, calm, appropriate, follows commands HEENT:  ETT in place Cardiovascular:  s1s2 regular, no murmur Lungs: Decreased breath sounds, no wheeze Abdomen:  Soft, non tender Musculoskeletal: no edema Skin: no rashes   LABS: Cbc  Lab 03/06/12 0455 03/05/12 0335 03/03/12 0420  WBC 7.6 -- --  HGB 12.0 12.4 14.7  HCT 39.8 39.8 47.5*  PLT 179 174 172    Chemistry   Lab 03/06/12 0455 03/05/12 0335 03/04/12 0410 03/01/12 0620  NA 146* 143 137 --  K 4.2 3.4* 5.0 --  CL 109 105 103 --  CO2 29 32 28 --  BUN 18 19 18  --  CREATININE 0.69 0.77 0.71 --  CALCIUM 8.1* 7.9* 7.6* --  MG -- -- -- 1.5  PHOS -- -- -- 3.0  GLUCOSE 114* 108* 144* --    Liver fxn  Lab 03/04/12 0410 03/03/12 0420  AST 122* 228*  ALT 135* 202*  ALKPHOS 44 58  BILITOT 0.2* 0.2*  PROT 5.7* 6.8  ALBUMIN 2.4* 3.2*    BNP  Lab 02/29/12 1119  PROBNP 1012.0*   ABG  Lab 03/06/12 0500 03/05/12 1311 03/05/12 0345  PHART 7.328* 7.314* 7.318*  PCO2ART 61.9* 57.8* 60.7*  PO2ART 72.8* 80.0 67.0*  HCO3 31.6* 29.4* 31.3*  TCO2 33.5 31 33    CBG trend  Lab 03/06/12 0827 03/06/12 0430 03/05/12 2346 03/05/12 2007 03/05/12 1546  GLUCAP 125* 111* 113*  131* 133*    IMAGING: Dg Chest Port 1 View  03/06/2012  *RADIOLOGY REPORT*  Clinical Data: Check endotracheal tube position.  PORTABLE CHEST - 1 VIEW  Comparison: 03/05/2012.  Findings: Endotracheal tube is in satisfactory position. Nasogastric tube is followed into the stomach.  Heart is mildly enlarged, stable.  Mild diffuse interstitial prominence and indistinctness with bibasilar atelectasis.  There may be bilateral pleural effusions.  IMPRESSION: Favor mild pulmonary edema with bibasilar atelectasis and probable small bilateral pleural effusions.  Pneumonia not excluded.   Original Report Authenticated By: Leanna Battles, M.D.    Dg Chest Port 1 View  03/05/2012  *RADIOLOGY REPORT*  Clinical Data: Follow up of pneumonia.  Hypertension.  Diabetes.  PORTABLE CHEST - 1 VIEW  Comparison: 1 day prior  Findings:  Endotracheal tube 2.9 cm above carina.  Nasogastric tube extends beyond the  inferior aspect of the film.  Cardiomegaly accentuated by AP portable technique.  Cannot exclude small bilateral pleural effusions. No pneumothorax.  Low lung volumes. Decreased left and similar right base air space disease.  IMPRESSION: Persistent low lung volumes with minimal improvement in left base aeration.  Otherwise, similar bibasilar atelectasis versus infection.   Original Report Authenticated By: Jeronimo Greaves, M.D.    Dg Chest Port 1 View  03/04/2012  *RADIOLOGY REPORT*  Clinical Data: Status post intubation.  PORTABLE CHEST - 1 VIEW  Comparison: 03/02/2012  Findings: 1824 hours.  Endotracheal tube tip is 2.8 cm above the base of the carina.  There is bibasilar atelectasis/infiltrate, left greater than right. The cardiopericardial silhouette is enlarged. Telemetry leads overlie the chest.  IMPRESSION: Endotracheal tube tip is approximately 3 cm above the base of the carina.  Bibasilar atelectasis or infiltrate, left greater than right.   Original Report Authenticated By: Kennith Center, M.D.      ASSESSMENT / PLAN:  PULMONARY  ASSESSMENT: Acute on chronic respiratory failure 2nd to AECOPD with postive H1N1 and Pneumococcal Ag. Likely also has OSA/OHS. PLAN:   Full vent support for now Change to pressure control 1/02 F/u CXR , ABG Continue Prednisone to 10 mg daily for now Continue scheduled BD's  CARDIOVASCULAR  ASSESSMENT:  Hx of HTN, CAD, Hyperlipidemia. PLAN:  KVO IV fluids Continue lopressor, imdur, ASA Hold benicar for now Lasix 40 mg q12h x 2 doses 1/02  RENAL  ASSESSMENT:   Metabolic alkalosis 2nd to respiratory acidosis. Given diamox 12/29. PLAN:   Monitor renal fx, urine outpt, electrolytes  GASTROINTESTINAL  ASSESSMENT:   Nutrition. Elevated LFT's. PLAN:   Tube feeds while on vent F/u LFT's intermittently PPI  HEMATOLOGIC  ASSESSMENT:   No acute issue. PLAN:  F/u cbc  intermittently  INFECTIOUS  ASSESSMENT:   H1N1, Pneumococcal Ag positive. PLAN:   D7/10 levaquin, tamiflu  ENDOCRINE  ASSESSMENT:   DM type II with hyperglycemia  PLAN:   SSI  NEUROLOGIC  ASSESSMENT:   Acute encephalopathy 2nd to hypercapnia/hypoxia. Chronic pain. PLAN:   Precedex while on vent  Summary: Concern is that she will need trach at some point.  Critical care time 35 minutes.  Coralyn Helling, MD [facility] Pulmonary/Critical Care 03/06/2012, 10:04 AM Pager:  [phone] After 3pm call: [phone]

## 2012-03-07 ENCOUNTER — Inpatient Hospital Stay (HOSPITAL_COMMUNITY): Payer: Medicaid Other

## 2012-03-07 DIAGNOSIS — R0602 Shortness of breath: Secondary | ICD-10-CM

## 2012-03-07 LAB — BASIC METABOLIC PANEL
CO2: 31 mEq/L (ref 19–32)
Chloride: 105 mEq/L (ref 96–112)
Potassium: 3.6 mEq/L (ref 3.5–5.1)
Sodium: 146 mEq/L — ABNORMAL HIGH (ref 135–145)

## 2012-03-07 LAB — GLUCOSE, CAPILLARY
Glucose-Capillary: 166 mg/dL — ABNORMAL HIGH (ref 70–99)
Glucose-Capillary: 137 mg/dL — ABNORMAL HIGH (ref 70–99)
Glucose-Capillary: 176 mg/dL — ABNORMAL HIGH (ref 70–99)

## 2012-03-07 LAB — CBC
HCT: 42 % (ref 36.0–46.0)
MCV: 94.4 fL (ref 78.0–100.0)
RBC: 4.45 MIL/uL (ref 3.87–5.11)
WBC: 7.4 10*3/uL (ref 4.0–10.5)

## 2012-03-07 MED ORDER — DEXMEDETOMIDINE HCL IN NACL 200 MCG/50ML IV SOLN
0.2000 ug/kg/h | INTRAVENOUS | Status: DC
  Administered 2012-03-07: 0.5 ug/kg/h via INTRAVENOUS
  Administered 2012-03-08: 0.4 ug/kg/h via INTRAVENOUS
  Administered 2012-03-08: 0.7 ug/kg/h via INTRAVENOUS
  Administered 2012-03-08 (×2): 0.4 ug/kg/h via INTRAVENOUS
  Administered 2012-03-08 (×2): 0.6 ug/kg/h via INTRAVENOUS
  Administered 2012-03-08: 0.4 ug/kg/h via INTRAVENOUS
  Administered 2012-03-09: 0.6 ug/kg/h via INTRAVENOUS
  Administered 2012-03-09: 0.7 ug/kg/h via INTRAVENOUS
  Filled 2012-03-07 (×10): qty 50

## 2012-03-07 MED ORDER — OSELTAMIVIR PHOSPHATE 75 MG PO CAPS
75.0000 mg | ORAL_CAPSULE | Freq: Two times a day (BID) | ORAL | Status: DC
  Filled 2012-03-07 (×2): qty 1

## 2012-03-07 MED ORDER — OSELTAMIVIR PHOSPHATE 6 MG/ML PO SUSR
150.0000 mg | Freq: Two times a day (BID) | ORAL | Status: AC
  Administered 2012-03-07 – 2012-03-11 (×10): 150 mg via ORAL
  Filled 2012-03-07 (×12): qty 25

## 2012-03-07 MED ORDER — POTASSIUM CHLORIDE 20 MEQ/15ML (10%) PO LIQD
40.0000 meq | Freq: Every day | ORAL | Status: DC
  Administered 2012-03-07 – 2012-03-12 (×6): 40 meq via ORAL
  Filled 2012-03-07 (×6): qty 30

## 2012-03-07 MED ORDER — DOCUSATE SODIUM 50 MG/5ML PO LIQD
50.0000 mg | Freq: Every day | ORAL | Status: DC
  Administered 2012-03-07 – 2012-03-11 (×4): 50 mg
  Filled 2012-03-07 (×6): qty 10

## 2012-03-07 MED ORDER — FREE WATER
200.0000 mL | Freq: Three times a day (TID) | Status: DC
  Administered 2012-03-07 – 2012-03-08 (×3): 200 mL

## 2012-03-07 MED ORDER — FUROSEMIDE 10 MG/ML IJ SOLN
40.0000 mg | Freq: Two times a day (BID) | INTRAMUSCULAR | Status: AC
  Administered 2012-03-07 (×2): 40 mg via INTRAVENOUS
  Filled 2012-03-07 (×2): qty 4

## 2012-03-07 MED ORDER — PERFLUTREN LIPID MICROSPHERE
1.0000 mL | INTRAVENOUS | Status: AC | PRN
  Administered 2012-03-07: 9 mL via INTRAVENOUS
  Filled 2012-03-07: qty 10

## 2012-03-07 MED ORDER — OXEPA PO LIQD: 1000.0000 mL | ORAL | Status: DC

## 2012-03-07 NOTE — Progress Notes (Signed)
  Echocardiogram 2D Echocardiogram Limited with Definity has been performed.  [name] 03/07/2012, 6:17 PM

## 2012-03-07 NOTE — Progress Notes (Signed)
No pfts on file [facility] or Marina Gravel MD

## 2012-03-07 NOTE — Progress Notes (Addendum)
CCM Resident note  Name: [name] MRN: [id number] DOB: 1955/11/14    LOS: 7  REFERRING MD :  Janee Morn   CHIEF COMPLAINT:  AECOPD and hypotension   BRIEF PATIENT DESCRIPTION:  57 yo female smoker admitted on 02/29/2012 with dyspnea, wheeze, cough 2nd to AECOPD with Flu A/H1N1 and S. pneumo Previously followed by Dr. Vassie Loll as outpt.  Significant PMHx of COPD on home oxygen, GERD, Hyperlipidemia, Chronic pain, HTN, CAD, DM  LINES / TUBES: PIV right and left arm 12/30>> ETT 12/31>> Rt radial aline 12/31>>  CULTURES: Influenza pcr 12/27>>>POSITIVE (Flu A/H1N1) u strep 12/28>>>POSITIVE u legionella 12/28>>>negative Urine culture 12/27>>>negative  ANTIBIOTICS: levaquin 12/27>>> tamiflu 12/27>>>  SIGNIFICANT EVENTS:  12/27 Admit ICU requiring BiPAP  DVT Px:  [place] heparin  GI Px:  PPI   INTERVAL HISTORY:  Unable to tolerate SBT.  VITAL SIGNS: Temp:  [97.8 F (36.6 C)-100.3 F (37.9 C)] 100.3 F (37.9 C) (01/03 0741) Pulse Rate:  [60-96] 77  (01/03 0756) Resp:  [11-23] 17  (01/03 0756) BP: (118-158)/(57-89) 154/78 mmHg (01/03 0756) SpO2:  [90 %-96 %] 95 % (01/03 0756) FiO2 (%):  [40 %] 40 % (01/03 0756) Weight:  [246 lb 11.1 oz (111.9 kg)] 246 lb 11.1 oz (111.9 kg) (01/03 0300) HEMODYNAMICS:   VENTILATOR SETTINGS: Vent Mode:  [-] PSV FiO2 (%):  [40 %] 40 % Set Rate:  [14 bmp-16 bmp] 14 bmp Vt Set:  [500 mL] 500 mL PEEP:  [5 cmH20] 5 cmH20 Pressure Support:  [8 cmH20] 8 cmH20 Plateau Pressure:  [21 cmH20-27 cmH20] 24 cmH20 INTAKE / OUTPUT: Intake/Output      01/02 0701 - 01/03 0700 01/03 0701 - 01/04 0700   I.V. (mL/kg) 1768.7 (15.8)    NG/GT 150    IV Piggyback 8    Total Intake(mL/kg) 1926.7 (17.2)    Urine (mL/kg/hr) 3570 (1.3)    Emesis/NG output 30    Total Output 3600    Net -1673.3           PHYSICAL EXAMINATION: General: No distress, lying in bed Neuro: Sedated (precedex 0.6, Fentanyl 100) and confused not following commands HEENT: ETT  in place Cardiovascular:  s1s2 regular, no murmur Lungs: b/l course breath sounds, no wheeze Abdomen:  Soft, non tender, obese, nl bs Musculoskeletal: no edema Skin: no rashes   LABS: Cbc  Lab 03/07/12 0335 03/06/12 0455 03/05/12 0335  WBC 7.4 -- --  HGB 13.5 12.0 12.4  HCT 42.0 39.8 39.8  PLT 145* 179 174    Chemistry   Lab 03/07/12 0335 03/06/12 0455 03/05/12 0335 03/01/12 0620  NA 146* 146* 143 --  K 3.6 4.2 3.4* --  CL 105 109 105 --  CO2 31 29 32 --  BUN 23 18 19  --  CREATININE 0.76 0.69 0.77 --  CALCIUM 8.5 8.1* 7.9* --  MG -- -- -- 1.5  PHOS -- -- -- 3.0  GLUCOSE 191* 114* 108* --    Liver fxn  Lab 03/04/12 0410 03/03/12 0420  AST 122* 228*  ALT 135* 202*  ALKPHOS 44 58  BILITOT 0.2* 0.2*  PROT 5.7* 6.8  ALBUMIN 2.4* 3.2*    BNP  Lab 03/07/12 0335 02/29/12 1119  PROBNP 1047.0* 1012.0*   ABG  Lab 03/06/12 1321 03/06/12 0500 03/05/12 1311  PHART 7.370 7.328* 7.314*  PCO2ART 55.5* 61.9* 57.8*  PO2ART 74.0* 72.8* 80.0  HCO3 31.9* 31.6* 29.4*  TCO2 34 33.5 31    CBG trend  Lab 03/07/12  7829 03/06/12 2339 03/06/12 2020 03/06/12 1556 03/06/12 1213  GLUCAP 166* 226* 225* 178* 106*    IMAGING: Dg Chest Port 1 View  03/07/2012  *RADIOLOGY REPORT*  Clinical Data: Evaluate endotracheal tube, pneumonia, CHF  PORTABLE CHEST - 1 VIEW  Comparison: 03/06/2012; 03/05/2012; 02/29/2012; 11/18/2009  Findings: Grossly unchanged borderline enlarged cardiac silhouette and mediastinal contours.  Stable position of support apparatus. Bibasilar heterogeneous opacities are grossly unchanged.  Mild pulmonary venous congestion without frank evidence of edema.  Trace bilateral effusions are unchanged.  No definite pneumothorax. Unchanged bones.  IMPRESSION: 1.  Stable positioning of support apparatus.  No pneumothorax. 2.  Grossly unchanged trace bilateral effusions and bibasilar opacities, atelectasis versus infiltrate.   Original Report Authenticated By: Tacey Ruiz, MD     Dg Chest Port 1 View  03/06/2012  *RADIOLOGY REPORT*  Clinical Data: Check endotracheal tube position.  PORTABLE CHEST - 1 VIEW  Comparison: 03/05/2012.  Findings: Endotracheal tube is in satisfactory position. Nasogastric tube is followed into the stomach.  Heart is mildly enlarged, stable.  Mild diffuse interstitial prominence and indistinctness with bibasilar atelectasis.  There may be bilateral pleural effusions.  IMPRESSION: Favor mild pulmonary edema with bibasilar atelectasis and probable small bilateral pleural effusions.  Pneumonia not excluded.   Original Report Authenticated By: Leanna Battles, M.D.      ASSESSMENT / PLAN:  PULMONARY 03/07/12 CXR with Stable positioning of support apparatus. No pneumothorax. Grossly unchanged trace bilateral effusions and bibasilar opacities, atelectasis versus infiltrate ASSESSMENT: Acute on chronic respiratory failure 2nd to AECOPD with postive Flu A, H1N1 and Pneumococcal Ag. Likely also has OSA/OHS. Likely ARDS - bilat infiltrates, ratio 185  PLAN:   Full vent support On PC ventilation with massive TV response Continue Prednisone to 10 mg daily for now Continue scheduled BD's (i.e Combivent) Consider cvp placement, assess cvp Would favor consideration back to [facility] keep total pressors less 30 SBT attempt as on low vent needs overall FACTT for neg balance  CARDIOVASCULAR  ASSESSMENT:  Hx of HTN, CAD, Hyperlipidemia. Echo 1/2 with EF 50-55%, ?apical mass, calcified false tendons/thrombus, RV mod increase size, systolic P mildly elevated.   PLAN:  KVO IV fluids, NS to kvo Continue lopressor 12.5 mg bid, imdur, ASA Hold benicar for now Consider lasix If cvp placed, use trend likely with pulm htn Repeat echo contrast if not uised, may need tee  RENAL  ASSESSMENT:   Metabolic alkalosis 2nd to respiratory acidosis. Mild hypernatremia  PLAN:   Monitor renal fx in am  Add free water, lasix repeat same dosing Chem in am    GASTROINTESTINAL  ASSESSMENT:   Nutrition. Elevated LFT's.  PLAN:   Tube feeds while on vent F/u LFT's intermittently PPI Follow BM Add colace oxepa  HEMATOLOGIC Thrombocytopenia   ASSESSMENT:   Mild drop in plat  PLAN:  F/u cbc intermittently. Sub q hep  INFECTIOUS  ASSESSMENT:   H1N1, Flu A, Pneumococcal Ag positive.  PLAN:   Day 8/10 levaquin, tamiflu x 10 days Has ards, increase tamiflu to 150 bid, ctr in am Repeat sputum, fever mild  ENDOCRINE  ASSESSMENT:   DM type II with hyperglycemia  PLAN:   SSI, Lantus 10 qhs   NEUROLOGIC  ASSESSMENT:   Acute encephalopathy 2nd to hypercapnia/hypoxia. Chronic pain.  PLAN:   Precedex while on vent, Fentanyl rass met -1  Summary: Wean sbt, ARDS met, pc ventialtion, lasix  Desma Maxim MD [phone] IM PGY 1    I have fully examined this  patient and agree with above findings.    And edited in full Ccm time 30 min   Mcarthur Rossetti. Tyson Alias, MD, FACP Pgr: [phone] [place] [facility] & Critical [facility]

## 2012-03-07 NOTE — Progress Notes (Signed)
Brief Nutrition Note--Consult  Received consult for TF initiation and management.  See initial nutrition assessment for details.  TF was initiated yesterday.  No problems noted at this time.  RD to continue to monitor for TF tolerance.  Joaquin Courts, RD, LDN, CNSC Pager# [phone] After Hours Pager# [phone]

## 2012-03-08 ENCOUNTER — Inpatient Hospital Stay (HOSPITAL_COMMUNITY): Payer: Medicaid Other

## 2012-03-08 DIAGNOSIS — G8929 Other chronic pain: Secondary | ICD-10-CM

## 2012-03-08 LAB — COMPREHENSIVE METABOLIC PANEL
ALT: 66 U/L — ABNORMAL HIGH (ref 0–35)
Alkaline Phosphatase: 60 U/L (ref 39–117)
BUN: 26 mg/dL — ABNORMAL HIGH (ref 6–23)
CO2: 41 mEq/L (ref 19–32)
Chloride: 102 mEq/L (ref 96–112)
GFR calc Af Amer: >90 mL/min (ref >90)
Glucose, Bld: 168 mg/dL — ABNORMAL HIGH (ref 70–99)
Potassium: 3.9 mEq/L (ref 3.5–5.1)
Total Bilirubin: 0.6 mg/dL (ref 0.3–1.2)

## 2012-03-08 LAB — GLUCOSE, CAPILLARY
Glucose-Capillary: 143 mg/dL — ABNORMAL HIGH (ref 70–99)
Glucose-Capillary: 165 mg/dL — ABNORMAL HIGH (ref 70–99)
Glucose-Capillary: 171 mg/dL — ABNORMAL HIGH (ref 70–99)

## 2012-03-08 LAB — CBC
MCHC: 31.1 g/dL (ref 30.0–36.0)
Platelets: 255 10*3/uL (ref 150–400)
RDW: 14.4 % (ref 11.5–15.5)
WBC: 8.2 10*3/uL (ref 4.0–10.5)

## 2012-03-08 LAB — URINALYSIS, MICROSCOPIC ONLY
Ketones, ur: NEGATIVE mg/dL
Leukocytes, UA: NEGATIVE
Nitrite: NEGATIVE
Protein, ur: NEGATIVE mg/dL
Urobilinogen, UA: 2 mg/dL — ABNORMAL HIGH (ref 0.0–1.0)
pH: 6.5 (ref 5.0–8.0)

## 2012-03-08 LAB — PHOSPHORUS: Phosphorus: 3.1 mg/dL (ref 2.3–4.6)

## 2012-03-08 MED ORDER — ACETAMINOPHEN 160 MG/5ML PO SOLN
325.0000 mg | Freq: Four times a day (QID) | ORAL | Status: DC | PRN
  Filled 2012-03-08: qty 20.3

## 2012-03-08 MED ORDER — METOPROLOL TARTRATE 25 MG/10 ML ORAL SUSPENSION
37.5000 mg | Freq: Two times a day (BID) | ORAL | Status: DC
  Administered 2012-03-08 – 2012-03-11 (×6): 37.5 mg
  Filled 2012-03-08 (×8): qty 15

## 2012-03-08 MED ORDER — VANCOMYCIN HCL 10 G IV SOLR
2000.0000 mg | Freq: Once | INTRAVENOUS | Status: AC
  Administered 2012-03-08: 2000 mg via INTRAVENOUS
  Filled 2012-03-08: qty 2000

## 2012-03-08 MED ORDER — SODIUM CHLORIDE 0.45 % IV SOLN
INTRAVENOUS | Status: DC
  Administered 2012-03-08 – 2012-03-11 (×3): via INTRAVENOUS

## 2012-03-08 MED ORDER — FREE WATER
200.0000 mL | Freq: Four times a day (QID) | Status: DC
  Administered 2012-03-08 – 2012-03-10 (×8): 200 mL

## 2012-03-08 MED ORDER — INSULIN GLARGINE 100 UNIT/ML ~~LOC~~ SOLN
15.0000 [IU] | Freq: Every day | SUBCUTANEOUS | Status: DC
  Administered 2012-03-08 – 2012-03-09 (×2): 15 [IU] via SUBCUTANEOUS

## 2012-03-08 MED ORDER — ACETAMINOPHEN 160 MG/5ML PO SOLN
325.0000 mg | Freq: Three times a day (TID) | ORAL | Status: DC | PRN
  Administered 2012-03-08 – 2012-03-09 (×2): 325 mg
  Filled 2012-03-08: qty 20.3

## 2012-03-08 MED ORDER — VANCOMYCIN HCL IN DEXTROSE 1-5 GM/200ML-% IV SOLN
1000.0000 mg | Freq: Two times a day (BID) | INTRAVENOUS | Status: DC
  Administered 2012-03-08 – 2012-03-11 (×7): 1000 mg via INTRAVENOUS
  Filled 2012-03-08 (×10): qty 200

## 2012-03-08 MED ORDER — DEXTROSE 5 % IV SOLN
1.0000 g | Freq: Three times a day (TID) | INTRAVENOUS | Status: DC
  Administered 2012-03-08 – 2012-03-11 (×10): 1 g via INTRAVENOUS
  Filled 2012-03-08 (×12): qty 1

## 2012-03-08 NOTE — Progress Notes (Signed)
CCM Resident note  Name: [name] MRN: [id number] DOB: 07/01/1955    LOS: 8  REFERRING MD :  Janee Morn   CHIEF COMPLAINT:  AECOPD and hypotension   BRIEF PATIENT DESCRIPTION:  57 yo female smoker admitted on 02/29/2012 with dyspnea, wheeze, cough 2nd to AECOPD with Flu A/H1N1 and S. pneumo Previously followed by Dr. Vassie Loll as outpt.  Significant PMHx of COPD on home oxygen, GERD, Hyperlipidemia, Chronic pain, HTN, CAD, DM  LINES / TUBES: PIV right and left arm 12/30>> ETT 12/31>> Rt radial aline 12/31>>out  CULTURES: Influenza pcr 12/27>>>POSITIVE (Flu A/H1N1) u strep 12/28>>>POSITIVE u legionella 12/28>>>negative Urine culture 12/27>>>negative 1/4 BC>>> 1/4 sputum>>> 1/3 sputum>>>  ANTIBIOTICS: levaquin 12/27>>>1/4 ceftaz 1/4>>> vanc 1/4>>> tamiflu 12/27>>>  SIGNIFICANT EVENTS:  12/27 Admit ICU requiring BiPAP  DVT Px:  [place] heparin  GI Px:  PPI   INTERVAL HISTORY:  Fever, cultured  VITAL SIGNS: Temp:  [99 F (37.2 C)-101.8 F (38.8 C)] 99.5 F (37.5 C) (01/04 0744) Pulse Rate:  [57-119] 65  (01/04 0733) Resp:  [12-23] 14  (01/04 0733) BP: (119-170)/(62-104) 144/96 mmHg (01/04 0700) SpO2:  [91 %-100 %] 98 % (01/04 0733) FiO2 (%):  [40 %] 40 % (01/04 0800) Weight:  [109.6 kg (241 lb 10 oz)] 109.6 kg (241 lb 10 oz) (01/04 0500) HEMODYNAMICS:   VENTILATOR SETTINGS: Vent Mode:  [-] PSV;CPAP FiO2 (%):  [40 %] 40 % Set Rate:  [14 bmp] 14 bmp Vt Set:  [500 mL] 500 mL PEEP:  [5 cmH20] 5 cmH20 Pressure Support:  [10 cmH20] 10 cmH20 Plateau Pressure:  [21 cmH20-25 cmH20] 22 cmH20 INTAKE / OUTPUT: Intake/Output      01/03 0701 - 01/04 0700 01/04 0701 - 01/05 0700   I.V. (mL/kg) 910.2 (8.3) 10 (0.1)   NG/GT 740 15   IV Piggyback     Total Intake(mL/kg) 1650.2 (15.1) 25 (0.2)   Urine (mL/kg/hr) 4195 (1.6)    Emesis/NG output     Stool 1    Total Output 4196    Net -2545.8 +25        Stool Occurrence 1 x      PHYSICAL EXAMINATION: General: cam  pos , agitated Neuro: Sedated rass 0, agitated HEENT: ETT in place Cardiovascular:  s1s2 regular, no murmur Lungs: b/l course  Abdomen:  Soft, non tender, obese, nl bs Musculoskeletal: no edema Skin: no rashes   LABS: Cbc  Lab 03/08/12 0530 03/07/12 0335 03/06/12 0455  WBC 8.2 -- --  HGB 13.0 13.5 12.0  HCT 41.8 42.0 39.8  PLT 255 145* 179    Chemistry   Lab 03/08/12 0530 03/07/12 0335 03/06/12 0455  NA 150* 146* 146*  K 3.9 3.6 4.2  CL 102 105 109  CO2 41* 31 29  BUN 26* 23 18  CREATININE 0.70 0.76 0.69  CALCIUM 8.7 8.5 8.1*  MG 1.8 -- --  PHOS 3.1 -- --  GLUCOSE 168* 191* 114*    Liver fxn  Lab 03/08/12 0530 03/04/12 0410 03/03/12 0420  AST 37 122* 228*  ALT 66* 135* 202*  ALKPHOS 60 44 58  BILITOT 0.6 0.2* 0.2*  PROT 6.6 5.7* 6.8  ALBUMIN 2.6* 2.4* 3.2*    BNP  Lab 03/07/12 0335  PROBNP 1047.0*   ABG  Lab 03/06/12 1321 03/06/12 0500 03/05/12 1311  PHART 7.370 7.328* 7.314*  PCO2ART 55.5* 61.9* 57.8*  PO2ART 74.0* 72.8* 80.0  HCO3 31.9* 31.6* 29.4*  TCO2 34 33.5 31    CBG  trend  Lab 03/08/12 0422 03/07/12 2345 03/07/12 1953 03/07/12 1653 03/07/12 1225  GLUCAP 143* 165* 176* 197* 221*    IMAGING: Dg Chest Port 1 View  03/07/2012  *RADIOLOGY REPORT*  Clinical Data: Evaluate endotracheal tube, pneumonia, CHF  PORTABLE CHEST - 1 VIEW  Comparison: 03/06/2012; 03/05/2012; 02/29/2012; 11/18/2009  Findings: Grossly unchanged borderline enlarged cardiac silhouette and mediastinal contours.  Stable position of support apparatus. Bibasilar heterogeneous opacities are grossly unchanged.  Mild pulmonary venous congestion without frank evidence of edema.  Trace bilateral effusions are unchanged.  No definite pneumothorax. Unchanged bones.  IMPRESSION: 1.  Stable positioning of support apparatus.  No pneumothorax. 2.  Grossly unchanged trace bilateral effusions and bibasilar opacities, atelectasis versus infiltrate.   Original Report Authenticated By: Tacey Ruiz, MD      ASSESSMENT / PLAN:  PULMONARY 03/07/12 CXR with Stable positioning of support apparatus. No pneumothorax. Grossly unchanged trace bilateral effusions and bibasilar opacities, atelectasis versus infiltrate ASSESSMENT: Acute on chronic respiratory failure 2nd to AECOPD with postive Flu A, H1N1 and Pneumococcal Ag. Likely also has OSA/OHS. Likely ARDS - bilat infiltrates, ratio 185  PLAN:   On PCV, low threshold transition to prvc Wean this am cpap4 ps 10 required Neg balance obtained and bases improved on pcxr May require trach Neg 2 liters goal again BDers pred remain  CARDIOVASCULAR  ASSESSMENT:  Hx of HTN, CAD, Hyperlipidemia. Echo 1/2 with EF 50-55%, ?apical mass, calcified false tendons/thrombus, RV mod increase size, systolic P mildly elevated.  Remains HTN PLAN:  NS to kvo Increase lopressor, imdur, ASA Hold benicar with upper airway issues also Did well with lasix, maintain Repeat echo contrast  - negative clot  RENAL  ASSESSMENT:   Metabolic alkalosis 2nd to respiratory acidosis. Mild hypernatremia  PLAN:   Increase free water Change to 1/2 NS at 50 Lasix maintain Chem in am   GASTROINTESTINAL  ASSESSMENT:   Nutrition. Elevated LFT's.  PLAN:   Tube feeds while on vent PPI Follow BM - noted x2 colace oxepa  HEMATOLOGIC Thrombocytopenia resolved  ASSESSMENT:   Mild drop in plat  PLAN:  F/u cbc in am again for plat Sub q hep  INFECTIOUS  ASSESSMENT:   H1N1, Flu A, Pneumococcal Ag positive. Fever 1/4 PLAN:   Continue tamiflu Dc levo Follow new cultures Add ceftaz, vanc UA sent - neg No line  ENDOCRINE  ASSESSMENT:   DM type II with hyperglycemia  PLAN:   SSI Increase lantus  NEUROLOGIC  ASSESSMENT:   Acute encephalopathy 2nd to hypercapnia/hypoxia. Chronic pain.  PLAN:   Precedex while on vent, Fentanyl rass goal 0 to -1  Summary: Wean sbt, ARDS, fever, abx changes, lasix continue, add more free  water   I have fully examined this patient and agree with above findings.    And edited in full Ccm time 30 min   Mcarthur Rossetti. Tyson Alias, MD, FACP Pgr: [phone] [facility]

## 2012-03-08 NOTE — Progress Notes (Addendum)
ANTIBIOTIC CONSULT NOTE - INITIAL  Pharmacy Consult for Vancomycin & Ceftazidime Indication: Suspected PNA  No Known Allergies  Patient Measurements: Height: 5' 5.5" (166.4 cm) Weight: 241 lb 10 oz (109.6 kg) IBW/kg (Calculated) : 58.15   Vital Signs: Temp: 99.5 F (37.5 C) (01/04 0744) Temp src: Oral (01/04 0744) BP: 144/96 mmHg (01/04 0700) Pulse Rate: 65  (01/04 0733)  Labs:  Basename 03/08/12 0530 03/07/12 0335 03/06/12 0455  WBC 8.2 7.4 7.6  HGB 13.0 13.5 12.0  PLT 255 145* 179  LABCREA -- -- --  CREATININE 0.70 0.76 0.69   Estimated Creatinine Clearance: 96.5 ml/min (by C-G formula based on Cr of 0.7).   Microbiology: Recent Results (from the past 720 hour(s))  MRSA PCR SCREENING     Status: Normal   Collection Time   02/29/12  5:07 PM      Component Value Range Status Comment   MRSA by PCR NEGATIVE  NEGATIVE Final   URINE CULTURE     Status: Normal   Collection Time   03/01/12  1:15 AM      Component Value Range Status Comment   Specimen Description URINE, CLEAN CATCH   Final    Special Requests NONE   Final    Culture  Setup Time 03/01/2012 12:34   Final    Colony Count NO GROWTH   Final    Culture NO GROWTH   Final    Report Status 03/02/2012 FINAL   Final   URINE CULTURE     Status: Normal   Collection Time   03/04/12  1:48 AM      Component Value Range Status Comment   Specimen Description URINE, CATHETERIZED   Final    Special Requests Normal   Final    Culture  Setup Time 03/04/2012 08:48   Final    Colony Count NO GROWTH   Final    Culture NO GROWTH   Final    Report Status 03/05/2012 FINAL   Final     Assessment: 56yo female with COPD admitted with AECOPD with Flu A/H1N1 & S.Pneumo currently on ventilator. Treated with Levaquin (12/27 >>1/3) & Tamiflu (12/27> 1/1, 1/3>>) now to add Vancomycin & Ceftazidime for pneumonia. Tmax 101.8'F overnight. Wt 109kg, CrCl >25ml/min, WBC 8.2, Blood cultures sent. Urine culture negative.   Goal of  Therapy:  Vancomycin trough level 15-20 mcg/ml Clinical improvement  Plan:  1) Continue Ceftazidime as ordered (ceftazidime 1g IV Q8h) 2) Start Vancomycin 2000mg  loading dose x 1, then Vancomycin 1000mg  Q12 hours 3) Monitor renal function, vancomycin trough as appropriate, cultures, clinical status  Benjaman Pott, PharmD    03/08/2012   9:10 AM

## 2012-03-08 NOTE — Progress Notes (Signed)
CRITICAL CARE RESIDENT NOTE Interim Progress Note   Late Entry  Subjective:    I was called overnight by the RN, as pt developed fever to 101.51F. She has otherwise remained afebrile during the hospital course. The rest of the vital signs were stable. She is currently being treated for H1N1 and S. pneumo infections, and has subsequently developed ARDS.   Objective:    BP 151/83  Pulse 57  Temp 100.3 F (37.9 C) (Oral)  Resp 12  Ht 5' 5.5" (1.664 m)  Wt 241 lb 10 oz (109.6 kg)  BMI 39.60 kg/m2  SpO2 98%   Labs: Basic Metabolic Panel:    Component Value Date/Time   NA 146* 03/07/2012 0335   K 3.6 03/07/2012 0335   CL 105 03/07/2012 0335   CO2 31 03/07/2012 0335   BUN 23 03/07/2012 0335   CREATININE 0.76 03/07/2012 0335   GLUCOSE 191* 03/07/2012 0335   CALCIUM 8.5 03/07/2012 0335    CBC:    Component Value Date/Time   WBC 7.4 03/07/2012 0335   HGB 13.5 03/07/2012 0335   HCT 42.0 03/07/2012 0335   PLT 145* 03/07/2012 0335   MCV 94.4 03/07/2012 0335   NEUTROABS 7.1 10/19/2011 0925   LYMPHSABS 1.7 10/19/2011 0925   MONOABS 0.5 10/19/2011 0925   EOSABS 0.1 10/19/2011 0925   BASOSABS 0.0 10/19/2011 0925    Cardiac Enzymes: Lab Results  Component Value Date   TROPONINI <0.30 03/04/2012    Assessment/ Plan:    1) Fever - cause of this patient's new onset of fever is unclear, no new source of infection identified. I obtained a UA given that the patient has a foley catheter, which was unremarkable. Blood cultures were also obtained x2. Lastly, she is already scheduled for CXR and repeat sputum cultures that might reveal the source of infection.  Plan: - Follow-up repeat CXR, sputum cultures, and blood cultures.    Signed: Johnette Abraham, Roma Schanz, Internal Medicine Resident Pager: [phone] (7PM-6AM) 03/08/2012, 5:47 AM

## 2012-03-09 ENCOUNTER — Inpatient Hospital Stay (HOSPITAL_COMMUNITY): Payer: Medicaid Other

## 2012-03-09 LAB — GLUCOSE, CAPILLARY
Glucose-Capillary: 248 mg/dL — ABNORMAL HIGH (ref 70–99)
Glucose-Capillary: 132 mg/dL — ABNORMAL HIGH (ref 70–99)
Glucose-Capillary: 222 mg/dL — ABNORMAL HIGH (ref 70–99)

## 2012-03-09 LAB — BASIC METABOLIC PANEL
BUN: 20 mg/dL (ref 6–23)
CO2: 41 mEq/L (ref 19–32)
Chloride: 100 mEq/L (ref 96–112)
Creatinine, Ser: 0.65 mg/dL (ref 0.50–1.10)
GFR calc Af Amer: >90 mL/min (ref >90)
Potassium: 3.3 mEq/L — ABNORMAL LOW (ref 3.5–5.1)

## 2012-03-09 LAB — DIFFERENTIAL
Basophils Relative: 1 % (ref 0–1)
Eosinophils Absolute: 0.1 10*3/uL (ref 0.0–0.7)
Eosinophils Relative: 1 % (ref 0–5)
Lymphs Abs: 0.5 10*3/uL — ABNORMAL LOW (ref 0.7–4.0)
Monocytes Relative: 9 % (ref 3–12)

## 2012-03-09 LAB — CBC
HCT: 42.6 % (ref 36.0–46.0)
Hemoglobin: 13 g/dL (ref 12.0–15.0)
MCV: 98.4 fL (ref 78.0–100.0)
RBC: 4.33 MIL/uL (ref 3.87–5.11)
RDW: 14.4 % (ref 11.5–15.5)
WBC: 8.2 10*3/uL (ref 4.0–10.5)

## 2012-03-09 LAB — CULTURE, RESPIRATORY W GRAM STAIN

## 2012-03-09 MED ORDER — ETOMIDATE 2 MG/ML IV SOLN
40.0000 mg | Freq: Once | INTRAVENOUS | Status: AC
  Administered 2012-03-10: 10 mg via INTRAVENOUS
  Filled 2012-03-09: qty 20

## 2012-03-09 MED ORDER — VECURONIUM BROMIDE 10 MG IV SOLR
10.0000 mg | Freq: Once | INTRAVENOUS | Status: AC
  Administered 2012-03-10: 10 mg via INTRAVENOUS
  Filled 2012-03-09: qty 10

## 2012-03-09 MED ORDER — FENTANYL CITRATE 0.05 MG/ML IJ SOLN
200.0000 ug | Freq: Once | INTRAMUSCULAR | Status: DC
  Filled 2012-03-09 (×2): qty 2

## 2012-03-09 MED ORDER — METHYLPREDNISOLONE SODIUM SUCC 40 MG IJ SOLR
40.0000 mg | Freq: Two times a day (BID) | INTRAMUSCULAR | Status: DC
  Administered 2012-03-09 – 2012-03-12 (×6): 40 mg via INTRAVENOUS
  Filled 2012-03-09 (×8): qty 1

## 2012-03-09 MED ORDER — DEXMEDETOMIDINE HCL IN NACL 400 MCG/100ML IV SOLN
0.4000 ug/kg/h | INTRAVENOUS | Status: DC
  Administered 2012-03-09: 0.9 ug/kg/h via INTRAVENOUS
  Administered 2012-03-09: 0.7 ug/kg/h via INTRAVENOUS
  Administered 2012-03-09 – 2012-03-10 (×7): 1 ug/kg/h via INTRAVENOUS
  Filled 2012-03-09 (×3): qty 100
  Filled 2012-03-09: qty 200
  Filled 2012-03-09 (×4): qty 100

## 2012-03-09 MED ORDER — POTASSIUM CHLORIDE 20 MEQ/15ML (10%) PO LIQD
40.0000 meq | Freq: Once | ORAL | Status: AC
  Administered 2012-03-09: 40 meq
  Filled 2012-03-09: qty 30

## 2012-03-09 MED ORDER — MIDAZOLAM HCL 2 MG/2ML IJ SOLN
4.0000 mg | Freq: Once | INTRAMUSCULAR | Status: AC
  Administered 2012-03-10: 2 mg via INTRAVENOUS
  Filled 2012-03-09: qty 4

## 2012-03-09 MED ORDER — FUROSEMIDE 8 MG/ML PO SOLN
60.0000 mg | Freq: Every day | ORAL | Status: DC
  Administered 2012-03-10: 60 mg via ORAL
  Filled 2012-03-09: qty 10

## 2012-03-09 MED ORDER — PROPOFOL 10 MG/ML IV EMUL: 5.0000 ug/kg/min | Freq: Once | INTRAVENOUS | Status: AC

## 2012-03-09 MED ORDER — INSULIN GLARGINE 100 UNIT/ML ~~LOC~~ SOLN: 20.0000 [IU] | Freq: Every day | SUBCUTANEOUS | Status: DC

## 2012-03-09 MED ORDER — POTASSIUM CHLORIDE 20 MEQ PO PACK
40.0000 meq | PACK | Freq: Once | ORAL | Status: DC
  Filled 2012-03-09: qty 2

## 2012-03-09 MED ORDER — MAGNESIUM SULFATE 40 MG/ML IJ SOLN
2.0000 g | Freq: Once | INTRAMUSCULAR | Status: AC
  Administered 2012-03-09: 2 g via INTRAVENOUS
  Filled 2012-03-09: qty 50

## 2012-03-09 MED ORDER — FUROSEMIDE 40 MG PO TABS
40.0000 mg | ORAL_TABLET | Freq: Two times a day (BID) | ORAL | Status: DC
  Filled 2012-03-09 (×3): qty 1

## 2012-03-09 MED ORDER — FUROSEMIDE 10 MG/ML IJ SOLN
40.0000 mg | Freq: Once | INTRAMUSCULAR | Status: AC
  Administered 2012-03-09: 40 mg via INTRAVENOUS
  Filled 2012-03-09: qty 4

## 2012-03-09 NOTE — Progress Notes (Signed)
CCM Resident note  Name: [name] MRN: [id number] DOB: 05-23-1955    LOS: 9  REFERRING MD :  Janee Morn   CHIEF COMPLAINT:  AECOPD and hypotension   BRIEF PATIENT DESCRIPTION:  57 yo female smoker admitted on 02/29/2012 with dyspnea, wheeze, cough 2nd to AECOPD with Flu A/H1N1 and S. pneumo Previously followed by Dr. Vassie Loll as outpt.  Significant PMHx of COPD on home oxygen, GERD, Hyperlipidemia, Chronic pain, HTN, CAD, DM  LINES / TUBES: PIV right wrist and anticubital 1/4 ETT 12/31>>  CULTURES: Influenza pcr 12/27>>>POSITIVE (Flu A/H1N1) u strep 12/28>>>POSITIVE u legionella 12/28>>>negative Urine culture 12/27>>>negative 1/4 BC>>>pending  1/3 sputum>>>no organisms, pending 1/5 sputum>>>pending and waiting to be collected    ANTIBIOTICS: levaquin 12/27>>>1/4 ceftaz 1/4>>> vanc 1/4>>> tamiflu 12/27>>>  SIGNIFICANT EVENTS:  12/27 Admit ICU requiring BiPAP 12/31- ETT  DVT Px:  [place] heparin  GI Px:  PPI   INTERVAL HISTORY:  Fever, cultured  VITAL SIGNS: Temp:  [98.9 F (37.2 C)-101.6 F (38.7 C)] 101.6 F (38.7 C) (01/05 0800) Pulse Rate:  [60-125] 125  (01/05 1000) Resp:  [12-24] 24  (01/05 1000) BP: (120-170)/(60-90) 170/90 mmHg (01/05 1000) SpO2:  [88 %-99 %] 92 % (01/05 1000) FiO2 (%):  [40 %] 40 % (01/05 1000) Weight:  [245 lb 9.5 oz (111.4 kg)] 245 lb 9.5 oz (111.4 kg) (01/05 0500) HEMODYNAMICS:   VENTILATOR SETTINGS: Vent Mode:  [-] PSV;CPAP FiO2 (%):  [40 %] 40 % Set Rate:  [14 bmp] 14 bmp PEEP:  [5 cmH20] 5 cmH20 Pressure Support:  [5 cmH20-10 cmH20] 5 cmH20 Plateau Pressure:  [11 cmH20-31 cmH20] 22 cmH20 INTAKE / OUTPUT: Intake/Output      01/04 0701 - 01/05 0700 01/05 0701 - 01/06 0700   I.V. (mL/kg) 1566.2 (14.1) 212.1 (1.9)   NG/GT 610 45   IV Piggyback 904 200   Total Intake(mL/kg) 3080.2 (27.7) 457.1 (4.1)   Urine (mL/kg/hr) 1985 (0.7) 1500   Stool     Total Output 1985 1500   Net +1095.2 -1042.9          PHYSICAL  EXAMINATION: General: lying in bed, asleep, will nod head to questions asked, eyes intermittently opened  Neuro: rass 0, intermittently nods head to questions, asleep but arousable, perrl b/l HEENT: ETT in place, [place]/at Cardiovascular:  RRR, no murmur Lungs: b/l course  Abdomen:  Soft, non tender, obese, nl bs Musculoskeletal: no edema, lower extremities cool to touch, no cyanosis  Skin: no rashes or breakdown    LABS: Cbc  Lab 03/09/12 0910 03/08/12 0530 03/07/12 0335  WBC 8.2 -- --  HGB 13.0 13.0 13.5  HCT 42.6 41.8 42.0  PLT 214 255 145*    Chemistry   Lab 03/09/12 0910 03/08/12 0530 03/07/12 0335  NA 147* 150* 146*  K 3.3* 3.9 3.6  CL 100 102 105  CO2 41* 41* 31  BUN 20 26* 23  CREATININE 0.65 0.70 0.76  CALCIUM 8.6 8.7 8.5  MG 1.6 1.8 --  PHOS 2.9 3.1 --  GLUCOSE 184* 168* 191*    Liver fxn  Lab 03/08/12 0530 03/04/12 0410 03/03/12 0420  AST 37 122* 228*  ALT 66* 135* 202*  ALKPHOS 60 44 58  BILITOT 0.6 0.2* 0.2*  PROT 6.6 5.7* 6.8  ALBUMIN 2.6* 2.4* 3.2*    BNP  Lab 03/07/12 0335  PROBNP 1047.0*   ABG  Lab 03/06/12 1321 03/06/12 0500 03/05/12 1311  PHART 7.370 7.328* 7.314*  PCO2ART 55.5* 61.9* 57.8*  PO2ART 74.0* 72.8* 80.0  HCO3 31.9* 31.6* 29.4*  TCO2 34 33.5 31    CBG trend  Lab 03/09/12 0349 03/08/12 2327 03/08/12 1923 03/08/12 1511 03/08/12 1109  GLUCAP 179* 248* 141* 171* 174*    IMAGING: Dg Chest Port 1 View  03/09/2012  *RADIOLOGY REPORT*  Clinical Data: Assessed basilar atelectasis  PORTABLE CHEST - 1 VIEW  Comparison: Portable exam 0510 hours compared to 03/08/2012  Findings: Endotracheal tube tip approximately 4.0 cm above carina. Nasogastric tube extends into abdomen. Minimal enlargement of cardiac silhouette. Atherosclerotic calcification aorta. Pulmonary vascularity normal. Persistent bibasilar opacities question atelectasis versus consolidation, greater on right. Upper lungs clear. No definite pleural effusion or pneumothorax.   IMPRESSION: Persistent bibasilar opacities right greater than left question atelectasis versus infiltrate.   Original Report Authenticated By: Ulyses Southward, M.D.    Dg Chest Port 1 View  03/08/2012  *RADIOLOGY REPORT*  Clinical Data: Acute respiratory failure on ventilator.  COPD.  PORTABLE CHEST - 1 VIEW  Comparison: 03/07/2012  Findings: Bilateral lower lobe airspace disease shows no significant change.  Heart size is stable.  Endotracheal tube and nasogastric remain in appropriate position.  IMPRESSION: Stable bilateral lower lobe airspace disease.   Original Report Authenticated By: Myles Rosenthal, M.D.      ASSESSMENT / PLAN:  PULMONARY 03/09/12 CXR with Persistent bibasilar opacities right greater than left question atelectasis versus infiltrate  ASSESSMENT: Acute on chronic respiratory failure 2nd to AECOPD with postive Flu A, H1N1 and Pneumococcal Ag. Suspect HAP Likely also has OSA/OHS. Likely ARDS - bilat infiltrates, ratio 185   PLAN:   BDers Consider increase steroids for lack of progress Continue Vanc and Fortaz since 1/4 May require trach if not weanig over next 48 hrs Neg 2 liters goal again-started lasix 40 iv once , repeat and add mainteance Control afterload  CARDIOVASCULAR ASSESSMENT:  Hx of HTN, CAD, Hyperlipidemia. Echo 1/3 with contrast did not ID apical mass  BP more controlled   PLAN:  NS to kvo Lopressor 37.5 q12, more controlled, imdur, ASA Hold benicar with upper airway issues also Did well with lasix, maintain neg 2L-started Lasix 40 mg iv x once  Strict i/o, daily weights  RENAL  ASSESSMENT:   Metabolic alkalosis 2nd to respiratory acidosis. Mild hypernatremia 147 hypoK 3.3  Mag 1.6  PLAN:   free water 200 cc q6, maintain Lasix increase  Already on K 40 meQ qd added additional 40 meQ x 1 Mag 2 g bolus iv  Pending chem  GASTROINTESTINAL  ASSESSMENT:   Nutrition. Elevated LFT's-trending down  PLAN:   Tube feeds with Opexa while on  vent PPI-Protonix  Colace Ensure BM  HEMATOLOGIC No issues  DVT px   ASSESSMENT:  none  PLAN:  Sub q hep  INFECTIOUS  ASSESSMENT:   H1N1, Flu A, Pneumococcal Ag positive. T max 101.1  PLAN:   Continue tamiflu 150 mg bid Day 3 (total 7 days thus far) Follow blood cultures pending 1/4 added ceftaz, vanc  ENDOCRINE  ASSESSMENT:   DM type II with hyperglycemia fsbs 140s-240s  PLAN:   SSI lantus 15 qhs increased to 20 qhs due to elevated cbgs   NEUROLOGIC  ASSESSMENT:   Acute encephalopathy 2nd to hypercapnia/hypoxia. Chronic pain.  PLAN:   Precedex while on vent, Fentanyl  Summary: sbt today, ARDS, pneumonia, H1N1, influenza A, fever, net + so lasix continue but increase, increase steroids, likely trach needed  Desma Maxim  PGY 1 IM [id number]  Ccm tmie 30 min  I have fully examined this patient and agree with above findings.    And edited infull  Mcarthur Rossetti. Tyson Alias, MD, FACP Pgr: [phone] [facility]

## 2012-03-09 NOTE — Progress Notes (Signed)
CRITICAL VALUE ALERT  Critical value received:  CO2-41  Date of notification:  03/09/2012  Time of notification:  1003  Critical value read back:yes  Nurse who received alert:  Gerre Pebbles  MD notified (1st page):  Dr Tyson Alias  Time of first page:  1003  MD notified (2nd page):  Time of second page:  Responding MD:  Dr Tyson Alias On Unit Time MD responded:  1003

## 2012-03-10 ENCOUNTER — Inpatient Hospital Stay (HOSPITAL_COMMUNITY): Admit: 2012-03-10 | Discharge: 2012-03-10 | Disposition: A | Discharge status: Home / Self Care | Payer: Medicaid Other

## 2012-03-10 ENCOUNTER — Inpatient Hospital Stay (HOSPITAL_COMMUNITY): Payer: Medicaid Other

## 2012-03-10 DIAGNOSIS — J9600 Acute respiratory failure, unspecified whether with hypoxia or hypercapnia: Secondary | ICD-10-CM

## 2012-03-10 LAB — BASIC METABOLIC PANEL
BUN: 23 mg/dL (ref 6–23)
CO2: 40 mEq/L (ref 19–32)
Chloride: 99 mEq/L (ref 96–112)
GFR calc Af Amer: >90 mL/min (ref >90)
Potassium: 4.1 mEq/L (ref 3.5–5.1)

## 2012-03-10 LAB — BLOOD GAS, ARTERIAL
Acid-Base Excess: 17.6 mmol/L — ABNORMAL HIGH (ref 0.0–2.0)
Drawn by: 31996
FIO2: 0.4 %
PEEP: 5 cmH2O
Pressure control: 25 cmH2O
RATE: 14 resp/min
pCO2 arterial: 68.9 mmHg (ref 35.0–45.0)
pH, Arterial: 7.415 (ref 7.350–7.450)
pO2, Arterial: 70 mmHg — ABNORMAL LOW (ref 80.0–100.0)

## 2012-03-10 LAB — PROTIME-INR: INR: 1.04 (ref 0.00–1.49)

## 2012-03-10 LAB — GLUCOSE, CAPILLARY
Glucose-Capillary: 210 mg/dL — ABNORMAL HIGH (ref 70–99)
Glucose-Capillary: 218 mg/dL — ABNORMAL HIGH (ref 70–99)
Glucose-Capillary: 225 mg/dL — ABNORMAL HIGH (ref 70–99)

## 2012-03-10 MED ORDER — LACTULOSE 10 GM/15ML PO SOLN
10.0000 g | Freq: Every day | ORAL | Status: DC
  Administered 2012-03-10 – 2012-03-11 (×2): 10 g
  Filled 2012-03-10 (×3): qty 15

## 2012-03-10 MED ORDER — DEXMEDETOMIDINE HCL IN NACL 400 MCG/100ML IV SOLN
INTRAVENOUS | Status: AC
  Filled 2012-03-10: qty 100

## 2012-03-10 MED ORDER — FREE WATER
200.0000 mL | Freq: Three times a day (TID) | Status: DC
  Administered 2012-03-10 – 2012-03-12 (×7): 200 mL

## 2012-03-10 MED ORDER — FENTANYL CITRATE 0.05 MG/ML IJ SOLN
25.0000 ug | INTRAMUSCULAR | Status: AC | PRN
  Administered 2012-03-10 – 2012-03-11 (×2): 50 ug via INTRAVENOUS

## 2012-03-10 MED ORDER — FUROSEMIDE 10 MG/ML IJ SOLN
60.0000 mg | Freq: Three times a day (TID) | INTRAMUSCULAR | Status: DC
  Administered 2012-03-10 (×2): 60 mg via INTRAVENOUS
  Filled 2012-03-10 (×2): qty 6

## 2012-03-10 MED ORDER — INSULIN GLARGINE 100 UNIT/ML ~~LOC~~ SOLN
25.0000 [IU] | Freq: Every day | SUBCUTANEOUS | Status: DC
  Administered 2012-03-10: 25 [IU] via SUBCUTANEOUS

## 2012-03-10 MED ORDER — DEXMEDETOMIDINE HCL IN NACL 200 MCG/50ML IV SOLN
0.4000 ug/kg/h | INTRAVENOUS | Status: DC
  Administered 2012-03-10 (×2): 1 ug/kg/h via INTRAVENOUS
  Filled 2012-03-10 (×3): qty 50

## 2012-03-10 MED ORDER — DEXMEDETOMIDINE HCL IN NACL 400 MCG/100ML IV SOLN
1.6000 ug/kg/h | INTRAVENOUS | Status: DC
  Administered 2012-03-10 – 2012-03-11 (×3): 1.2 ug/kg/h via INTRAVENOUS
  Administered 2012-03-11 (×4): 1.3 ug/kg/h via INTRAVENOUS
  Administered 2012-03-11: 1.2 ug/kg/h via INTRAVENOUS
  Administered 2012-03-11 – 2012-03-12 (×2): 1.3 ug/kg/h via INTRAVENOUS
  Administered 2012-03-12: 1 ug/kg/h via INTRAVENOUS
  Administered 2012-03-12: 1.3 ug/kg/h via INTRAVENOUS
  Filled 2012-03-10 (×13): qty 100

## 2012-03-10 NOTE — Progress Notes (Signed)
Video bronchoscopy intervention performed with bedside trach insertion. Bronchial washing intervention performed.

## 2012-03-10 NOTE — Procedures (Signed)
Procedure done by [name] ACNP. At first bronch was introduce through ET tube and structures of tracheal rings, carina identified for operator of tracheostomy who was Dr Tyson Alias. The rings were noted to have copious white exudate over much of the tracheal rings.  Light of bronch passed through trachea and skin for indentification of tracheal rings for tracheostomy puncture. After this, under bronchoscopy guidance,  ET tube was pulled back sufficiently and very carefully. The ET tube was  pulled back enough to give room for tracheostomy operator and yet at same time to to ensure a secured airway. After this was accomplished, bronchoscope was withdrawn into the ET tube. After this,  Dr Tyson Alias then performed tracheostomy under video visual provided by flexible video bronchoscopy. Followng introduction of tracheostomy,  the bronchoscope was removed from ET tube and introduced through tracheostomy. Correct position of tracheostomy was ensured, with enough room between carina and distal tracheostomy and no evidence of bleeding. The bronchoscope was then withdrawn. Respiratory therapist was then instructed to remove the ET tube.  Dr Tyson Alias then proceeded to complete the tracheostomy with stay sutures.  After trach sutured the airway was inspected. Clear secretions noted but no purulent drainage from either side Both right, left evaluated. The primary abnormality seemed to be localized to the upper airway and tracheal rings which were coated with white exudate.   Sample from RML sent for BAL.  [name] ACNP-BC [facility] Pulmonary/Critical Care Pager # [phone] OR # [phone] if no answer    No complications    Sherry Fischer, MD ; [facility] service Mobile [phone].  After 5:30 PM or weekends, call [phone]

## 2012-03-10 NOTE — Progress Notes (Signed)
CCM Resident note  Name: [name] MRN: [id number] DOB: Mar 12, 1955    LOS: 10  REFERRING MD :  Janee Morn   CHIEF COMPLAINT:  AECOPD and hypotension   BRIEF PATIENT DESCRIPTION:  57 yo female smoker admitted on 02/29/2012 with dyspnea, wheeze, cough 2nd to AECOPD with Flu A/H1N1 and S. pneumo Previously followed by Dr. Vassie Loll as outpt.  Significant PMHx of COPD on home oxygen, GERD, Hyperlipidemia, Chronic pain, HTN, CAD, DM  LINES / TUBES: PIV right wrist and anticubital 1/4 ETT 12/31>>  CULTURES: Influenza pcr 12/27>>>POSITIVE (Flu A/H1N1) u strep 12/28>>>POSITIVE u legionella 12/28>>>negative Urine culture 12/27>>>negative 1/4 BC>>>NTD and pending  1/3 sputum>>>no organisms, non pathogenic oral flora 1/5 sputum>>>pending    ANTIBIOTICS: levaquin 12/27>>>1/4 ceftaz 1/4>>> vanc 1/4>>> tamiflu 12/27>>>  SIGNIFICANT EVENTS:  12/27 Admit ICU requiring BiPAP 12/31- ETT  DVT Px:  [place] heparin  GI Px:  PPI-Protonix   INTERVAL HISTORY:  Still spiking fevers Tmax 101.6  VITAL SIGNS: Temp:  [98.9 F (37.2 C)-101.6 F (38.7 C)] 99.3 F (37.4 C) (01/06 0427) Pulse Rate:  [53-125] 63  (01/06 0600) Resp:  [12-24] 14  (01/06 0600) BP: (97-170)/(48-90) 144/78 mmHg (01/06 0600) SpO2:  [88 %-96 %] 91 % (01/06 0600) FiO2 (%):  [40 %] 40 % (01/06 0600) Weight:  [249 lb 12.5 oz (113.3 kg)] 249 lb 12.5 oz (113.3 kg) (01/06 0500) HEMODYNAMICS:   VENTILATOR SETTINGS: Vent Mode:  [-] PCV FiO2 (%):  [40 %] 40 % Set Rate:  [14 bmp] 14 bmp PEEP:  [5 cmH20] 5 cmH20 Pressure Support:  [5 cmH20-10 cmH20] 10 cmH20 Plateau Pressure:  [22 cmH20-29 cmH20] 26 cmH20 INTAKE / OUTPUT: Intake/Output      01/05 0701 - 01/06 0700   I.V. (mL/kg) 1765.4 (15.6)   NG/GT 285   IV Piggyback 600   Total Intake(mL/kg) 2650.4 (23.4)   Urine (mL/kg/hr) 2410 (0.9)   Total Output 2410   Net +240.4         PHYSICAL EXAMINATION: General: lying in bed, asleep but arousable, nods head, eyes  intermittently opened  Neuro: asleep but arousable, perrl b/l HEENT: ETT in place with small tan thick secretions, [place]/at Cardiovascular:  RRR, no murmur Lungs: min crackles right lung base, cta left Abdomen:  Soft, non tender, obese, nl bs Musculoskeletal: no edema, warm lower extremities, no cyanosis  Skin: no rashes or breakdown    LABS: Cbc  Lab 03/09/12 0910 03/08/12 0530 03/07/12 0335  WBC 8.2 -- --  HGB 13.0 13.0 13.5  HCT 42.6 41.8 42.0  PLT 214 255 145*    Chemistry   Lab 03/10/12 0458 03/09/12 0910 03/08/12 0530  NA 142 147* 150*  K 4.1 3.3* 3.9  CL 99 100 102  CO2 40* 41* 41*  BUN 23 20 26*  CREATININE 0.73 0.65 0.70  CALCIUM 8.7 8.6 8.7  MG 2.2 1.6 1.8  PHOS 2.6 2.9 3.1  GLUCOSE 220* 184* 168*    Liver fxn  Lab 03/08/12 0530 03/04/12 0410  AST 37 122*  ALT 66* 135*  ALKPHOS 60 44  BILITOT 0.6 0.2*  PROT 6.6 5.7*  ALBUMIN 2.6* 2.4*    BNP  Lab 03/07/12 0335  PROBNP 1047.0*   ABG  Lab 03/06/12 1321 03/06/12 0500 03/05/12 1311  PHART 7.370 7.328* 7.314*  PCO2ART 55.5* 61.9* 57.8*  PO2ART 74.0* 72.8* 80.0  HCO3 31.9* 31.6* 29.4*  TCO2 34 33.5 31    CBG trend  Lab 03/10/12 0337 03/09/12 2339 03/09/12 1925  03/09/12 1517 03/09/12 1115  GLUCAP 210* 218* 222* 204* 236*    IMAGING: Dg Chest Port 1 View  03/09/2012  *RADIOLOGY REPORT*  Clinical Data: Assessed basilar atelectasis  PORTABLE CHEST - 1 VIEW  Comparison: Portable exam 0510 hours compared to 03/08/2012  Findings: Endotracheal tube tip approximately 4.0 cm above carina. Nasogastric tube extends into abdomen. Minimal enlargement of cardiac silhouette. Atherosclerotic calcification aorta. Pulmonary vascularity normal. Persistent bibasilar opacities question atelectasis versus consolidation, greater on right. Upper lungs clear. No definite pleural effusion or pneumothorax.  IMPRESSION: Persistent bibasilar opacities right greater than left question atelectasis versus infiltrate.   Original  Report Authenticated By: Ulyses Southward, M.D.    Dg Chest Port 1 View  03/08/2012  *RADIOLOGY REPORT*  Clinical Data: Acute respiratory failure on ventilator.  COPD.  PORTABLE CHEST - 1 VIEW  Comparison: 03/07/2012  Findings: Bilateral lower lobe airspace disease shows no significant change.  Heart size is stable.  Endotracheal tube and nasogastric remain in appropriate position.  IMPRESSION: Stable bilateral lower lobe airspace disease.   Original Report Authenticated By: Myles Rosenthal, M.D.      ASSESSMENT / PLAN:  PULMONARY ASSESSMENT: Acute on chronic respiratory failure 2nd to AECOPD with postive Flu A, H1N1 and Pneumococcal Ag. Suspect HAP Likely also has OSA/OHS. Likely ARDS  PLAN:   BDers (i.e. Combivent), Solumedrol 40 q12, maintain  Continue Vanc and Fortaz since 1/4 Attempted weaning cpap 5 ps 5, goal 1 hr, failed this am as last attempt pre trach trach at 11 am with f/u CXR CXR 1/7 am Need to increase neg balance  CARDIOVASCULAR ASSESSMENT:  Hx of HTN-BP more controlled, CAD, Hyperlipidemia.  PLAN:  NS to kvo Lopressor, imdur, ASA Strict i/o, daily weights - lasix  RENAL  ASSESSMENT:   Metabolic alkalosis 2nd to respiratory acidosis. Resolved hypernatremia, electrolytes wnl  PLAN:   free water 200 cc q6 reduce to q8h Lasix 60 to iv q8h Monitor BMET qd   GASTROINTESTINAL ASSESSMENT:   Nutrition. Elevated LFT's-trending down  PLAN:   Tube feeds with Opexa, Prostat while on vent - held for trach PPI-Protonix  Colace Add lactulose for bm  HEMATOLOGIC No issues  DVT px   ASSESSMENT:  none  PLAN:  Sub q hep coags wnl, plat wnl  INFECTIOUS ASSESSMENT:   H1N1, Flu A, Pneumococcal Ag positive. T max 101.6  PLAN:   Continue tamiflu 150 mg bid Day 4 (total 8 days thus far) Follow blood cultures pending Continue ceftaz, vanc- during trach will BAl with bronch UA was neg, no line  ENDOCRINE ASSESSMENT:   DM type II with hyperglycemia fsbs  130s-230s  PLAN:   SSI, lantus 20 qhs increased to 25 units qhs   NEUROLOGIC ASSESSMENT:   Acute encephalopathy 2nd to hypercapnia/hypoxia.  PLAN:   Precedex while on vent, Fentanyl Hold precedex for trach, wil likley dc this  Summary: trach today, ARDS, pneumonia, H1N1, influenza A, fever, net + so lasix 60 qd, solumedrol, increase lasix  Desma Maxim  PGY 1 IM [phone]  I have fully examined this patient and agree with above findings.    aned edited in full  Mcarthur Rossetti. Tyson Alias, MD, FACP Pgr: [phone] [facility]

## 2012-03-10 NOTE — Procedures (Signed)
Bedside Tracheostomy Insertion Procedure Note   Patient Details:   Name: [name] DOB: [birth year] MRN: [id number]  Procedure: Tracheostomy  Pre Procedure Assessment: ET Tube Size:7.5 ET Tube secured at lip (cm):24 Bite block in place: Yes Breath Sounds: Clear  Post Procedure Assessment: BP 141/82  Pulse 91  Temp 99 F (37.2 C) (Oral)  Resp 14  Ht 5' 5.5" (1.664 m)  Wt 249 lb 12.5 oz (113.3 kg)  BMI 40.93 kg/m2  SpO2 97% O2 sats: stable throughout Complications: No apparent complications Patient did tolerate procedure well Tracheostomy Brand:[place] Tracheostomy Style:Cuffed Tracheostomy Size: 6.0 Tracheostomy Secured AOZ:[id number] Tracheostomy Placement Confirmation:Chest X ray ordered for placement    Leonard Downing 03/10/2012, 1:07 PM

## 2012-03-10 NOTE — Progress Notes (Signed)
Second shift NT and Rn completed the partial bath to make it and full complete bath.

## 2012-03-10 NOTE — Progress Notes (Signed)
CRITICAL VALUE ALERT  Critical value received:  CO2 40  Date of notification:  03/10/2012  Time of notification:  6:40 AM  Critical value read back:yes  Nurse who received alert:  Vassie Moselle  MD notified (1st page):  [place]  Time of first page:  6:40 AM  MD notified (2nd page):  Time of second page:  Responding MD:  Shirlee Latch  Time MD responded:  6:40 AM

## 2012-03-10 NOTE — Progress Notes (Signed)
Inpatient Diabetes Program Recommendations  AACE/ADA: New Consensus Statement on Inpatient Glycemic Control (2013)  Target Ranges:  Prepandial:   less than 140 mg/dL      Peak postprandial:   less than 180 mg/dL (1-2 hours)      Critically ill patients:  140 - 180 mg/dL   Reason for Visit: Results for [name], [name] (MRN [id number]) as of 03/10/2012 10:13  Ref. Range 03/09/2012 11:15 03/09/2012 15:17 03/09/2012 19:25 03/09/2012 23:39 03/10/2012 03:37 03/10/2012 07:34  Glucose-Capillary Latest Range: 70-99 mg/dL 782 (H) 956 (H) 213 (H) 218 (H) 210 (H) 225 (H)   CBG's consistently greater than 200 mg/dL. Patient was taking 70/30  70 units tid prior to admission.   Please consider phase 2 of ICU Glycemic control protocol.    Will follow.

## 2012-03-11 ENCOUNTER — Encounter (HOSPITAL_COMMUNITY): Payer: Self-pay | Admitting: Radiology

## 2012-03-11 ENCOUNTER — Inpatient Hospital Stay (HOSPITAL_COMMUNITY): Payer: Medicaid Other

## 2012-03-11 DIAGNOSIS — J9589 Other postprocedural complications and disorders of respiratory system, not elsewhere classified: Secondary | ICD-10-CM

## 2012-03-11 LAB — CBC
HCT: 39 % (ref 36.0–46.0)
Hemoglobin: 12 g/dL (ref 12.0–15.0)
MCH: 30.6 pg (ref 26.0–34.0)
MCHC: 30.8 g/dL (ref 30.0–36.0)
RBC: 3.92 MIL/uL (ref 3.87–5.11)

## 2012-03-11 LAB — BASIC METABOLIC PANEL
BUN: 31 mg/dL — ABNORMAL HIGH (ref 6–23)
Chloride: 92 mEq/L — ABNORMAL LOW (ref 96–112)
Creatinine, Ser: 0.8 mg/dL (ref 0.50–1.10)
GFR calc Af Amer: >90 mL/min (ref >90)

## 2012-03-11 LAB — GLUCOSE, CAPILLARY
Glucose-Capillary: 179 mg/dL — ABNORMAL HIGH (ref 70–99)
Glucose-Capillary: 289 mg/dL — ABNORMAL HIGH (ref 70–99)
Glucose-Capillary: 334 mg/dL — ABNORMAL HIGH (ref 70–99)

## 2012-03-11 LAB — CULTURE, RESPIRATORY W GRAM STAIN

## 2012-03-11 LAB — POCT I-STAT 3, ART BLOOD GAS (G3+)
Acid-Base Excess: 25 mmol/L — ABNORMAL HIGH (ref 0.0–2.0)
Bicarbonate: 53.6 mEq/L — ABNORMAL HIGH (ref 20.0–24.0)
pO2, Arterial: 64 mmHg — ABNORMAL LOW (ref 80.0–100.0)

## 2012-03-11 MED ORDER — DEXTROSE 5 % IV SOLN
10.0000 mg/kg | Freq: Three times a day (TID) | INTRAVENOUS | Status: DC
  Administered 2012-03-11 – 2012-03-12 (×4): 580 mg via INTRAVENOUS
  Filled 2012-03-11 (×6): qty 11.6

## 2012-03-11 MED ORDER — DEXTROSE 50 % IV SOLN
INTRAVENOUS | Status: AC
  Administered 2012-03-11: 50 mL via INTRAVENOUS
  Filled 2012-03-11: qty 50

## 2012-03-11 MED ORDER — RISPERIDONE 1 MG/ML PO SOLN
1.0000 mg | Freq: Two times a day (BID) | ORAL | Status: DC
  Administered 2012-03-11 – 2012-03-12 (×3): 1 mg
  Filled 2012-03-11 (×4): qty 1

## 2012-03-11 MED ORDER — DEXTROSE 50 % IV SOLN
50.0000 mL | Freq: Once | INTRAVENOUS | Status: AC
  Administered 2012-03-11: 50 mL via INTRAVENOUS

## 2012-03-11 MED ORDER — HEPARIN SODIUM (PORCINE) 5000 UNIT/ML IJ SOLN
5000.0000 [IU] | Freq: Three times a day (TID) | INTRAMUSCULAR | Status: DC
  Filled 2012-03-11 (×3): qty 1

## 2012-03-11 MED ORDER — HEPARIN SODIUM (PORCINE) 5000 UNIT/ML IJ SOLN
5000.0000 [IU] | Freq: Three times a day (TID) | INTRAMUSCULAR | Status: DC
  Administered 2012-03-11 (×2): 5000 [IU] via SUBCUTANEOUS
  Filled 2012-03-11 (×6): qty 1

## 2012-03-11 MED ORDER — METOPROLOL TARTRATE 25 MG/10 ML ORAL SUSPENSION
12.5000 mg | Freq: Two times a day (BID) | ORAL | Status: DC
  Administered 2012-03-11 – 2012-03-12 (×2): 12.5 mg
  Filled 2012-03-11 (×3): qty 5

## 2012-03-11 MED ORDER — HALOPERIDOL LACTATE 5 MG/ML IJ SOLN
1.0000 mg | INTRAMUSCULAR | Status: DC | PRN
  Administered 2012-03-12: 1 mg via INTRAVENOUS
  Filled 2012-03-11: qty 1

## 2012-03-11 MED ORDER — INSULIN GLARGINE 100 UNIT/ML ~~LOC~~ SOLN
20.0000 [IU] | Freq: Every day | SUBCUTANEOUS | Status: DC
  Administered 2012-03-11: 20 [IU] via SUBCUTANEOUS

## 2012-03-11 MED ORDER — DEXTROSE 50 % IV SOLN
INTRAVENOUS | Status: AC
  Administered 2012-03-11: 50 mL
  Filled 2012-03-11: qty 50

## 2012-03-11 MED ORDER — POTASSIUM CHLORIDE 20 MEQ/15ML (10%) PO LIQD
40.0000 meq | Freq: Once | ORAL | Status: AC
  Administered 2012-03-11: 40 meq via ORAL
  Filled 2012-03-11: qty 30

## 2012-03-11 MED ORDER — SODIUM CHLORIDE 0.9 % IV SOLN
500.0000 mg | Freq: Four times a day (QID) | INTRAVENOUS | Status: DC
  Administered 2012-03-11 – 2012-03-12 (×6): 500 mg via INTRAVENOUS
  Filled 2012-03-11 (×9): qty 500

## 2012-03-11 NOTE — Progress Notes (Signed)
Pt did not tolerate pressure support of 5. RT turned up pressure support to 14 due to increased RR with pt.

## 2012-03-11 NOTE — H&P (Signed)
[name] is an 57 y.o. female.   Chief Complaint: long hx COPD H1N1; resp distress Met acidosis On vent; poor weaning Encephalopathy; protein calorie malnutrition Scheduled for percutaneous gastric tube placement HPI: COPD; morbid obese; smoker; HLD; HTN; CAD/MI; DM  Past Medical History  Diagnosis Date  . COPD (chronic obstructive pulmonary disease)   . Respiratory failure   . Morbid obesity   . Tobacco abuse   . GERD (gastroesophageal reflux disease)   . Hyperlipidemia   . Chronic pain   . Hypertension   . Heart attack   . Arthritis   . Allergic rhinitis   . Diabetes mellitus, type 2   . Diabetes mellitus type 2 in obese 02/29/2012  . HTN (hypertension) 02/29/2012    Past Surgical History  Procedure Date  . Cholecystectomy   . Partial hysterectomy   . Angioplasty   . Umbilical hernia repair 12/2009    No family history on file. Social History:  reports that she has quit smoking. Her smoking use included Cigarettes. She has a 40 pack-year smoking history. She has never used smokeless tobacco. She reports that she does not drink alcohol. Her drug history not on file.  Allergies: No Known Allergies  Medications Prior to Admission  Medication Sig Dispense Refill  . albuterol (VENTOLIN HFA) 108 (90 BASE) MCG/ACT inhaler Inhale 2 puffs into the lungs every 6 (six) hours as needed.       . budesonide-formoterol (SYMBICORT) 160-4.5 MCG/ACT inhaler Inhale 2 puffs into the lungs 2 (two) times daily.        . cyclobenzaprine (FLEXERIL) 10 MG tablet Take 10 mg by mouth 3 (three) times daily.       Marland Kitchen Dextromethorphan Polistirex (DELSYM PO) Take 5 mLs by mouth 2 (two) times daily as needed. For cough      . furosemide (LASIX) 40 MG tablet Take 40 mg by mouth 2 (two) times daily.       . Hydrocodone-Acetaminophen 5-300 MG TABS Take 1 tablet by mouth every 4 (four) hours as needed. For pain      . insulin NPH-insulin regular (NOVOLIN 70/30) (70-30) 100 UNIT/ML injection  Inject 70 Units into the skin 3 (three) times daily as needed. For BG > 200      . isosorbide mononitrate (IMDUR) 30 MG 24 hr tablet Take 30 mg by mouth daily.        . metFORMIN (GLUCOPHAGE) 1000 MG tablet Take 1,000 mg by mouth 2 (two) times daily with a meal.        . metoprolol (TOPROL-XL) 50 MG 24 hr tablet Take 50 mg by mouth daily.        Marland Kitchen olmesartan (BENICAR) 40 MG tablet Take 40 mg by mouth daily.      . potassium chloride 20 MEQ/15ML (10%) solution Take 20 mEq by mouth 2 (two) times daily.      Marland Kitchen rOPINIRole (REQUIP) 2 MG tablet Take 2 mg by mouth at bedtime.      Marland Kitchen aspirin 81 MG tablet Take 81 mg by mouth 2 (two) times daily.          Results for orders placed during the hospital encounter of 02/29/12 (from the past 48 hour(s))  GLUCOSE, CAPILLARY     Status: Abnormal   Collection Time   03/09/12  3:17 PM      Component Value Range Comment   Glucose-Capillary 204 (*) 70 - 99 mg/dL   GLUCOSE, CAPILLARY     Status: Abnormal  Collection Time   03/09/12  7:25 PM      Component Value Range Comment   Glucose-Capillary 222 (*) 70 - 99 mg/dL    Comment 1 Notify RN     GLUCOSE, CAPILLARY     Status: Abnormal   Collection Time   03/09/12 11:39 PM      Component Value Range Comment   Glucose-Capillary 218 (*) 70 - 99 mg/dL    Comment 1 Notify RN     GLUCOSE, CAPILLARY     Status: Abnormal   Collection Time   03/10/12  3:37 AM      Component Value Range Comment   Glucose-Capillary 210 (*) 70 - 99 mg/dL    Comment 1 Notify RN     PROTIME-INR     Status: Normal   Collection Time   03/10/12  4:58 AM      Component Value Range Comment   Prothrombin Time 13.5  11.6 - 15.2 seconds    INR 1.04  0.00 - 1.49   APTT     Status: Normal   Collection Time   03/10/12  4:58 AM      Component Value Range Comment   aPTT 27  24 - 37 seconds   BASIC METABOLIC PANEL     Status: Abnormal   Collection Time   03/10/12  4:58 AM      Component Value Range Comment   Sodium 142  135 - 145 mEq/L    Potassium  4.1  3.5 - 5.1 mEq/L    Chloride 99  96 - 112 mEq/L    CO2 40 (*) 19 - 32 mEq/L    Glucose, Bld 220 (*) 70 - 99 mg/dL    BUN 23  6 - 23 mg/dL    Creatinine, Ser 4.09  0.50 - 1.10 mg/dL    Calcium 8.7  8.4 - 81.1 mg/dL    GFR calc non Af Amer >90  >90 mL/min    GFR calc Af Amer >90  >90 mL/min   MAGNESIUM     Status: Normal   Collection Time   03/10/12  4:58 AM      Component Value Range Comment   Magnesium 2.2  1.5 - 2.5 mg/dL   PHOSPHORUS     Status: Normal   Collection Time   03/10/12  4:58 AM      Component Value Range Comment   Phosphorus 2.6  2.3 - 4.6 mg/dL   GLUCOSE, CAPILLARY     Status: Abnormal   Collection Time   03/10/12  7:34 AM      Component Value Range Comment   Glucose-Capillary 225 (*) 70 - 99 mg/dL    Comment 1 Documented in Chart      Comment 2 Notify RN     CULTURE, BAL-QUANTITATIVE     Status: Normal (Preliminary result)   Collection Time   03/10/12 12:15 PM      Component Value Range Comment   Specimen Description BRONCHIAL ALVEOLAR LAVAGE      Special Requests NONE      Gram Stain        Value: RARE WBC PRESENT,BOTH PMN AND MONONUCLEAR     NO SQUAMOUS EPITHELIAL CELLS SEEN     NO ORGANISMS SEEN   Colony Count PENDING      Culture NO GROWTH      Report Status PENDING     GLUCOSE, CAPILLARY     Status: Abnormal   Collection Time   03/10/12  12:43 PM      Component Value Range Comment   Glucose-Capillary 272 (*) 70 - 99 mg/dL   BLOOD GAS, ARTERIAL     Status: Abnormal   Collection Time   03/10/12  3:05 PM      Component Value Range Comment   FIO2 0.40      Delivery systems VENTILATOR      Mode PRESSURE CONTROL      Rate 14      Peep/cpap 5.0      Pressure control 25      pH, Arterial 7.415  7.350 - 7.450    pCO2 arterial 68.9 (*) 35.0 - 45.0 mmHg    pO2, Arterial 70.0 (*) 80.0 - 100.0 mmHg    Bicarbonate 43.4 (*) 20.0 - 24.0 mEq/L    TCO2 45.5  0 - 100 mmol/L    Acid-Base Excess 17.6 (*) 0.0 - 2.0 mmol/L    O2 Saturation 91.9      Patient  temperature 98.6      Collection site LEFT RADIAL      Drawn by [phone]      Sample type ARTERIAL DRAW      Allens test (pass/fail) PASS  PASS   GLUCOSE, CAPILLARY     Status: Abnormal   Collection Time   03/10/12  3:50 PM      Component Value Range Comment   Glucose-Capillary 237 (*) 70 - 99 mg/dL   GLUCOSE, CAPILLARY     Status: Abnormal   Collection Time   03/10/12  7:59 PM      Component Value Range Comment   Glucose-Capillary 204 (*) 70 - 99 mg/dL   GLUCOSE, CAPILLARY     Status: Abnormal   Collection Time   03/10/12 11:43 PM      Component Value Range Comment   Glucose-Capillary 334 (*) 70 - 99 mg/dL   POCT I-STAT 3, BLOOD GAS (G3+)     Status: Abnormal   Collection Time   03/11/12  4:02 AM      Component Value Range Comment   pH, Arterial 7.493 (*) 7.350 - 7.450    pCO2 arterial 69.9 (*) 35.0 - 45.0 mmHg    pO2, Arterial 64.0 (*) 80.0 - 100.0 mmHg    Bicarbonate 53.6 (*) 20.0 - 24.0 mEq/L    TCO2 >50  0 - 100 mmol/L    O2 Saturation 92.0      Acid-Base Excess 25.0 (*) 0.0 - 2.0 mmol/L    Patient temperature 98.6 F      Collection site RADIAL, ALLEN'S TEST ACCEPTABLE      Drawn by Operator      Sample type ARTERIAL      Comment VALUES EXPECTED, NO REPEAT     BASIC METABOLIC PANEL     Status: Abnormal   Collection Time   03/11/12  4:10 AM      Component Value Range Comment   Sodium 144  135 - 145 mEq/L    Potassium 3.4 (*) 3.5 - 5.1 mEq/L DELTA CHECK NOTED   Chloride 92 (*) 96 - 112 mEq/L    CO2 >45 (*) 19 - 32 mEq/L    Glucose, Bld 73  70 - 99 mg/dL    BUN 31 (*) 6 - 23 mg/dL    Creatinine, Ser 9.14  0.50 - 1.10 mg/dL    Calcium 9.0  8.4 - 78.2 mg/dL    GFR calc non Af Amer 80 (*) >90 mL/min    GFR  calc Af Amer >90  >90 mL/min   MAGNESIUM     Status: Normal   Collection Time   03/11/12  4:10 AM      Component Value Range Comment   Magnesium 1.8  1.5 - 2.5 mg/dL   PHOSPHORUS     Status: Normal   Collection Time   03/11/12  4:10 AM      Component Value Range Comment    Phosphorus 3.5  2.3 - 4.6 mg/dL   GLUCOSE, CAPILLARY     Status: Abnormal   Collection Time   03/11/12  4:16 AM      Component Value Range Comment   Glucose-Capillary 64 (*) 70 - 99 mg/dL    Comment 1 Documented in Chart      Comment 2 Notify RN     GLUCOSE, CAPILLARY     Status: Abnormal   Collection Time   03/11/12  7:52 AM      Component Value Range Comment   Glucose-Capillary 61 (*) 70 - 99 mg/dL   GLUCOSE, CAPILLARY     Status: Abnormal   Collection Time   03/11/12  8:43 AM      Component Value Range Comment   Glucose-Capillary 158 (*) 70 - 99 mg/dL   GLUCOSE, CAPILLARY     Status: Abnormal   Collection Time   03/11/12 12:55 PM      Component Value Range Comment   Glucose-Capillary 179 (*) 70 - 99 mg/dL    Dg Chest Port 1 View  03/11/2012  *RADIOLOGY REPORT*  Clinical Data: Tracheostomy, respiratory failure  PORTABLE CHEST - 1 VIEW  Comparison: 03/10/2012; 03/09/2012; 03/08/2012  Findings: Grossly unchanged cardiac silhouette and mediastinal contours.  Stable position of support apparatus.  Grossly unchanged possible trace bilateral effusions and bibasilar heterogeneous opacities, left greater than right.  Pulmonary vasculature remains indistinct.  No pneumothorax.  Unchanged bones.  IMPRESSION: 1.  Stable positioning of support apparatus.  No pneumothorax. 2.  Grossly unchanged findings of mild pulmonary edema, with bilateral effusions and bibasilar opacities, left greater than right, atelectasis versus infiltrate.   Original Report Authenticated By: Tacey Ruiz, MD    Chest Portable 1 View To Assess Tube Placement And Rule-out Pneumothorax  03/10/2012  *RADIOLOGY REPORT*  Clinical Data: Tube placement.  Evaluate for pneumothorax.  PORTABLE CHEST - 1 VIEW  Comparison: Multiple recent previous exams.  Findings: 1431 hours.  Endotracheal tube is no longer visible. Tracheostomy tube is new in the interval. The NG tube passes into the stomach although the distal tip position is not included on  the film. The cardiopericardial silhouette is enlarged.  There is a left greater than right bibasilar atelectasis.  No evidence for pneumothorax. Telemetry leads overlie the chest.  IMPRESSION: No evidence for pneumothorax status post tracheostomy tube placement.  Left greater than right bibasilar atelectasis or infiltrate.   Original Report Authenticated By: Kennith Center, M.D.     Review of Systems  Constitutional: Negative for fever.  Respiratory: Positive for sputum production and shortness of breath.   Gastrointestinal: Negative for nausea and vomiting.    Blood pressure 146/84, pulse 75, temperature 100.4 F (38 C), temperature source Oral, resp. rate 21, height 5' 5.5" (1.664 m), weight 249 lb 12.5 oz (113.3 kg), SpO2 98.00%. Physical Exam  Cardiovascular: Normal rate and regular rhythm.   No murmur heard. Respiratory:       On vent  GI: Soft. Bowel sounds are normal. She exhibits distension.  Obese   Musculoskeletal:       On vent; restrained  Neurological:       Very little response     Assessment/Plan COPD; H1N1 On vent Encephalopathic Poor weaning from vent Protein malnutrition Scheduled for long term use - perc g tube pts dtr aware of procedure benefits and risks and agreeable to proceed Consent signed and in chart Pt on Vanc and Primaxin Hep inj held 1/8 Barium ordered Check KUB in am  Kuba Shepherd A 03/11/2012, 1:12 PM

## 2012-03-11 NOTE — H&P (Signed)
Agree with PA note.    Signed,  Kaiel Weide K. Mashonda Broski, MD Vascular & Interventional Radiologist [facility]  

## 2012-03-11 NOTE — Progress Notes (Signed)
Metabolic alkalosis   Stop lasix;

## 2012-03-11 NOTE — Op Note (Signed)
NAMESHUNTAVIA, YERBY NO.:  [id number]  MEDICAL RECORD NO.:  [id number]  LOCATION:  2102                         FACILITY:  [facility]  PHYSICIAN:  Nelda Bucks, MD DATE OF BIRTH:  [birth year]  DATE OF PROCEDURE: DATE OF DISCHARGE:                              OPERATIVE REPORT   PROCEDURES:  Percutaneous tracheostomy on yesterday's date March 10, 2012.  Consent was obtained from the patient's daughter, fully aware of risks and benefits of the procedure.  PREOPERATIVE DIAGNOSES:  Obstructive sleep apnea, chronic obstructive pulmonary disease, H1N1, pneumonia, failure to wean.  POSTOPERATIVE DIAGNOSIS:  Status post tracheostomy secondary to H1N1 pneumonia.  BRONCHOSCOPIST FOR THE PROCEDURE:  Oley Balm. Sung Amabile, MD.  FIRST ASSISTANT:  Zenia Resides, ACNP  The patient is kept n.p.o.  Coagulation panel and platelet count was all within normal limits prior to the procedure.  PROCEDURE:  The patient placed in supine position.  Chlorhexidine preparation was used to sterilize the operative site.  Bronchoscopist placed a bronchoscope through the endotracheal tube, backed up to approximately 16 cm.  6 mL of lidocaine plus epinephrine was injected over the 2nd endotracheal space.  A 1 cm vertical incision was made over the 2nd endotracheal space.  Dissection was made down to the tracheal planes, indication the strap muscle was made.  There was no vascular anomalies noted.  The 18-gauge needle over the white catheter sheath was placed into the airway without any posterior wall injury.  A wire was placed through the white catheter sheath, white catheter sheath was removed.  A 14-French punch dilator was then placed over the wire and removed.  A progressive rhino dilator was placed over a glider over the wire and airway successfully and directly visualized without any posterior wall injury or trauma.  Everything was removed except for the glider and wire.  A size 6  tracheostomy over a 26-French dilator was placed over the glider wire successfully into the airway.  Everything was removed except the tracheostomy,  tracheostomy was sutured in place with 4-0 monofilament sutures.  Bronchoscopist then placed the bronchoscope through new tracheostomy, noted carina approximately 4 cm below without posterior wall injury.  Blood loss for the procedure was approximately 20 mL.  The patient tolerated the procedure quite well. Postoperative chest x-ray revealed well placed tracheostomy.     Nelda Bucks, MD     DJF/MEDQ  D:  03/11/2012  T:  03/11/2012  Job:  [id number]  cc:   Oretha Milch, MD

## 2012-03-11 NOTE — Progress Notes (Signed)
[facility] Resident note  Name: [name] MRN: [id number] DOB: [birth year]    LOS: 11  REFERRING MD :  Janee Morn   CHIEF COMPLAINT:  AECOPD and hypotension   BRIEF PATIENT DESCRIPTION:  57 yo female smoker admitted on 02/29/2012 with dyspnea, wheeze, cough 2nd to AECOPD with Flu A/H1N1 and S. pneumo Previously followed by Dr. Vassie Loll as outpt.  Significant PMHx of COPD on home oxygen, GERD, Hyperlipidemia, Chronic pain, HTN, CAD, DM  LINES / TUBES: PIV right wrist and anticubital 1/4 ETT 12/31>>1/6 Trach (DF) 1/6>> NGT  CULTURES: Influenza pcr 12/27>>>POSITIVE (Flu A/H1N1) u strep 12/28>>>POSITIVE u legionella 12/28>>>negative Urine culture 12/27>>>negative 1/4 BC>>>NTD and pending  1/3 sputum>>>no organisms, non pathogenic oral flora 1/5 sputum>>>no growth and pending  1/6 HSV>>> 1/6 BAL>>   ANTIBIOTICS: levaquin 12/27>>>1/4 ceftaz 1/4>>> vanc 1/4>>> tamiflu 12/27>>>1/7  SIGNIFICANT EVENTS:  12/27 Admit ICU requiring BiPAP 12/31- ETT 1/6-Trach  DVT Px:  SCD GI Px:  PPI-Protonix   INTERVAL HISTORY:  Still spiking fevers Tmax 101.8 Mild alk  VITAL SIGNS: Temp:  [98.4 F (36.9 C)-101.8 F (38.8 C)] 98.4 F (36.9 C) (01/06 2347) Pulse Rate:  [33-114] 98  (01/07 0332) Resp:  [14-26] 19  (01/07 0332) BP: (108-161)/(45-88) 136/71 mmHg (01/07 0332) SpO2:  [74 %-100 %] 93 % (01/07 0332) FiO2 (%):  [40 %-100 %] 50 % (01/07 0332) HEMODYNAMICS:   VENTILATOR SETTINGS: Vent Mode:  [-] PCV FiO2 (%):  [40 %-100 %] 50 % Set Rate:  [14 bmp] 14 bmp Vt Set:  [500 mL] 500 mL PEEP:  [5 cmH20] 5 cmH20 Pressure Support:  [5 cmH20] 5 cmH20 Plateau Pressure:  [28 cmH20-30 cmH20] 30 cmH20 INTAKE / OUTPUT: Intake/Output      01/06 0701 - 01/07 0700   I.V. (mL/kg) 1666.9 (14.7)   NG/GT 165   IV Piggyback 556   Total Intake(mL/kg) 2387.9 (21.1)   Urine (mL/kg/hr) 5775 (2.1)   Total Output 5775   Net -3387.1         PHYSICAL EXAMINATION: General: lying in bed,  arousable, following commands Neuro: follows commands, perrl b/l HEENT: trach intact with dried blood, [place]/at Cardiovascular:  RRR, no murmur Lungs: course breath sounds b/l Abdomen:  Soft, non tender, obese, hypoactive bs Musculoskeletal: no edema, warm lower extremities, no cyanosis  Skin: no rashes or breakdown    LABS: Cbc  Lab 03/09/12 0910 03/08/12 0530 03/07/12 0335  WBC 8.2 -- --  HGB 13.0 13.0 13.5  HCT 42.6 41.8 42.0  PLT 214 255 145*    Chemistry   Lab 03/11/12 0410 03/10/12 0458 03/09/12 0910  NA 144 142 147*  K 3.4* 4.1 3.3*  CL 92* 99 100  CO2 >45* 40* 41*  BUN 31* 23 20  CREATININE 0.80 0.73 0.65  CALCIUM 9.0 8.7 8.6  MG 1.8 2.2 1.6  PHOS 3.5 2.6 2.9  GLUCOSE 73 220* 184*    Liver fxn  Lab 03/08/12 0530  AST 37  ALT 66*  ALKPHOS 60  BILITOT 0.6  PROT 6.6  ALBUMIN 2.6*    BNP  Lab 03/07/12 0335  PROBNP 1047.0*   ABG  Lab 03/11/12 0402 03/10/12 1505 03/06/12 1321  PHART 7.493* 7.415 7.370  PCO2ART 69.9* 68.9* 55.5*  PO2ART 64.0* 70.0* 74.0*  HCO3 53.6* 43.4* 31.9*  TCO2 >50 45.5 34    CBG trend  Lab 03/11/12 0416 03/10/12 2343 03/10/12 1959 03/10/12 1550 03/10/12 1243  GLUCAP 64* 334* 204* 237* 272*    IMAGING: Chest Portable  1 View To Assess Tube Placement And Rule-out Pneumothorax  03/10/2012  *RADIOLOGY REPORT*  Clinical Data: Tube placement.  Evaluate for pneumothorax.  PORTABLE CHEST - 1 VIEW  Comparison: Multiple recent previous exams.  Findings: 1431 hours.  Endotracheal tube is no longer visible. Tracheostomy tube is new in the interval. The NG tube passes into the stomach although the distal tip position is not included on the film. The cardiopericardial silhouette is enlarged.  There is a left greater than right bibasilar atelectasis.  No evidence for pneumothorax. Telemetry leads overlie the chest.  IMPRESSION: No evidence for pneumothorax status post tracheostomy tube placement.  Left greater than right bibasilar atelectasis  or infiltrate.   Original Report Authenticated By: Kennith Center, M.D.      ASSESSMENT / PLAN:  PULMONARY 1/6 CXR No evidence for pneumothorax status post tracheostomy tube placement. Left greater than right bibasilar atelectasis or infiltrate.  ASSESSMENT: Acute on chronic respiratory failure 2nd to AECOPD with postive Flu A, H1N1 and Pneumococcal Ag. Suspect HAP Likely also has OSA/OHS. Likely ARDS Worsening Metabolic alkalosis   PLAN:   BDers (i.e. Combivent), Solumedrol 40 q12, may need increase Continue Vanc and Fortaz since 1/4 Tried trach collar previous night Lasix d/c'ed d/t worsening met. Alkalosis, could add acetazolamide if volume is felt main issue, hold for now Wean goal cpap5 PS 14 needed  CARDIOVASCULAR ASSESSMENT:  Hx of HTN-BP more controlled, CAD, Hyperlipidemia. Urine out 24 hours 5.77L, net neg 1.65L  Bradycardia overnight-Brady to the 30s  PLAN:  NS to kvo Lopressor, reduce, imdur, ASA Strict i/o, daily weights - lasix Monitor VS. Pending EKG  RENAL  ASSESSMENT:   Metabolic alkalosis 2nd to respiratory acidosis. Hypokalemia  PLAN:   Lasix d/ced, likely have maximzied neg balance, PCXR c/w int changes, and pNA (bronch impresive) K repleted Monitor BMET qd  No role acetazolimide  GASTROINTESTINAL ASSESSMENT:   Nutrition. Elevated LFT's-trending down PEG placement, not progressing  PLAN:   Tube feeds with Opexa, Prostat  PPI-Protonix  Colace, lactulose peg  HEMATOLOGIC No issues  DVT px   ASSESSMENT:  none  PLAN:  Sub q hep restart  INFECTIOUS ASSESSMENT:   H1N1, Flu A, Pneumococcal Ag positive. T max 101.8 Impressive tracheitis as well  PLAN:   Continue tamiflu 150 mg bid Day 5 (total 9 days thus far)-Continue Tamiflu x 1 more day Follow blood cultures NTD and pending, f/u BAL studies Continue ceftaz, vanc, may need to cover esbl, add imipenem, dc vanc Empiric acyclovir , for tracheal findings, until hsv  back  ENDOCRINE ASSESSMENT:   DM type II with hyperglycemia this admission now hypoglycemic this am  PLAN:   SSI, lantus 25 units decreased to 20 units qhs  Avoid hypoglcemic events Per DM coordinator consider phase 2 protocol but patient was 77 this am so will not initiate  NEUROLOGIC ASSESSMENT:   Acute encephalopathy 2nd to hypercapnia/hypoxia. Still agitated overnight delirium  PLAN:   Precedex, can increase to 1.6 Add Risperdal 1 q12h Haldol prn, last qtc wnl Avoid benzo when able, not a home med  Summary: ARDS, pneumonia, H1N1, influenza A, fever persists, pending BAL studies, worsening met. Alkalosis d/c Lasix, still cam pos, place peg, por weaning  Desma Maxim  PGY 1 IM [id number]  Ccm time 30 min  I have fully examined this patient and agree with above findings.    And edite din full  Mcarthur Rossetti. Tyson Alias, MD, FACP Pgr: [phone] [facility]

## 2012-03-11 NOTE — Progress Notes (Addendum)
ANTIBIOTIC CONSULT NOTE - F/U  Pharmacy Consult for Vancomycin, Primaxin, Acyclovir  Indication: Suspected PNA  No Known Allergies  Patient Measurements: Height: 5' 5.5" (166.4 cm) Weight: 249 lb 12.5 oz (113.3 kg) IBW/kg (Calculated) : 58.15   Vital Signs: Temp: 98.4 F (36.9 C) (01/06 2347) Temp src: Oral (01/06 2347) BP: 144/83 mmHg (01/07 0700) Pulse Rate: 72  (01/07 0700)  Labs:  Basename 03/11/12 0410 03/10/12 0458 03/09/12 0910  WBC -- -- 8.2  HGB -- -- 13.0  PLT -- -- 214  LABCREA -- -- --  CREATININE 0.80 0.73 0.65   Estimated Creatinine Clearance: 98.2 ml/min (by C-G formula based on Cr of 0.8).   Microbiology: Recent Results (from the past 720 hour(s))  MRSA PCR SCREENING     Status: Normal   Collection Time   02/29/12  5:07 PM      Component Value Range Status Comment   MRSA by PCR NEGATIVE  NEGATIVE Final   URINE CULTURE     Status: Normal   Collection Time   03/01/12  1:15 AM      Component Value Range Status Comment   Specimen Description URINE, CLEAN CATCH   Final    Special Requests NONE   Final    Culture  Setup Time 03/01/2012 12:34   Final    Colony Count NO GROWTH   Final    Culture NO GROWTH   Final    Report Status 03/02/2012 FINAL   Final   URINE CULTURE     Status: Normal   Collection Time   03/04/12  1:48 AM      Component Value Range Status Comment   Specimen Description URINE, CATHETERIZED   Final    Special Requests Normal   Final    Culture  Setup Time 03/04/2012 08:48   Final    Colony Count NO GROWTH   Final    Culture NO GROWTH   Final    Report Status 03/05/2012 FINAL   Final   CULTURE, RESPIRATORY     Status: Normal   Collection Time   03/07/12 10:16 AM      Component Value Range Status Comment   Specimen Description TRACHEAL ASPIRATE   Final    Special Requests Immunocompromised   Final    Gram Stain     Final    Value: MODERATE WBC PRESENT, PREDOMINANTLY PMN     FEW SQUAMOUS EPITHELIAL CELLS PRESENT     NO ORGANISMS  SEEN   Culture Non-Pathogenic Oropharyngeal-type Flora Isolated.   Final    Report Status 03/09/2012 FINAL   Final   CULTURE, BLOOD (ROUTINE X 2)     Status: Normal (Preliminary result)   Collection Time   03/08/12  1:14 AM      Component Value Range Status Comment   Specimen Description BLOOD RIGHT ARM   Final    Special Requests BOTTLES DRAWN AEROBIC AND ANAEROBIC 10CC EACH   Final    Culture  Setup Time 03/08/2012 11:20   Final    Culture     Final    Value:        BLOOD CULTURE RECEIVED NO GROWTH TO DATE CULTURE WILL BE HELD FOR 5 DAYS BEFORE ISSUING A FINAL NEGATIVE REPORT   Report Status PENDING   Incomplete   CULTURE, BLOOD (ROUTINE X 2)     Status: Normal (Preliminary result)   Collection Time   03/08/12  1:14 AM      Component Value Range  Status Comment   Specimen Description BLOOD RIGHT HAND   Final    Special Requests BOTTLES DRAWN AEROBIC AND ANAEROBIC 10CC EACH   Final    Culture  Setup Time 03/08/2012 11:19   Final    Culture     Final    Value:        BLOOD CULTURE RECEIVED NO GROWTH TO DATE CULTURE WILL BE HELD FOR 5 DAYS BEFORE ISSUING A FINAL NEGATIVE REPORT   Report Status PENDING   Incomplete   CULTURE, RESPIRATORY     Status: Normal (Preliminary result)   Collection Time   03/09/12  7:54 AM      Component Value Range Status Comment   Specimen Description TRACHEAL ASPIRATE   Final    Special Requests NONE   Final    Gram Stain PENDING   Incomplete    Culture NO GROWTH   Final    Report Status PENDING   Incomplete   CULTURE, BAL-QUANTITATIVE     Status: Normal (Preliminary result)   Collection Time   03/10/12 12:15 PM      Component Value Range Status Comment   Specimen Description BRONCHIAL ALVEOLAR LAVAGE   Final    Special Requests NONE   Final    Gram Stain     Final    Value: RARE WBC PRESENT,BOTH PMN AND MONONUCLEAR     NO SQUAMOUS EPITHELIAL CELLS SEEN     NO ORGANISMS SEEN   Colony Count PENDING   Incomplete    Culture PENDING   Incomplete    Report  Status PENDING   Incomplete     Assessment: 57yo female with COPD admitted with AECOPD with Flu A/H1N1 & S.Pneumo currently on ventilator with Vancomycin, Primaxin, Acyclovir empirically as well as Tamiflu. Tmax 101.8'F overnight. CrCl >14ml/min, last WBC wnl but rending up. All cultures NGTD  Goal of Therapy:  Vancomycin trough level 15-20 mcg/ml Clinical improvement  Plan:  - Start Primaxin 500mg  IV q6h - Start Acyclovir 580mg  IV q8h - Continue Vancomycin 1000mg  Q12 hours - Monitor renal function, vancomycin trough as appropriate, cultures, clinical status  Thank you, Franchot Erichsen, Pharm.D. Clinical Pharmacist   Pager: [phone] 03/11/2012 7:33 AM

## 2012-03-11 NOTE — Progress Notes (Signed)
SLP Cancellation Note - [facility] Speaking Valve  Patient Details Name: [name] MRN: [id number] DOB: 02/16/1956   Cancelled treatment:       Reason Eval/Treat Not Completed: Patient not medically ready.  Pt on ventilator. Will attempt PMSV when pt tolerating ATC.   Harlon Ditty, MA CCC-SLP [phone]  Claudine Mouton 03/11/2012, 10:22 AM

## 2012-03-11 NOTE — Progress Notes (Signed)
Clinical Social Work  CSW received referral to assist with SNF. Per chart review, patient is not eligible for LTAC due to insurance concerns. CSW spoke with bedside RN who reports patient received trach and currently on vent. Plan for PEG tomorrow. Patient unable to participate in assessment at this time and no family present at bedside. CSW will continue to follow to assist with dc planning.  [place], [place] [phone]

## 2012-03-12 ENCOUNTER — Inpatient Hospital Stay (HOSPITAL_COMMUNITY): Payer: Medicaid Other

## 2012-03-12 ENCOUNTER — Inpatient Hospital Stay
Admission: RE | Admit: 2012-03-12 | Discharge: 2012-04-15 | Disposition: A | Discharge status: Home / Self Care | Payer: Medicare Other | Source: Other Acute Inpatient Hospital | Attending: Internal Medicine | Admitting: Internal Medicine

## 2012-03-12 DIAGNOSIS — J811 Chronic pulmonary edema: Secondary | ICD-10-CM | POA: Diagnosis present

## 2012-03-12 DIAGNOSIS — K567 Ileus, unspecified: Secondary | ICD-10-CM | POA: Diagnosis not present

## 2012-03-12 DIAGNOSIS — J9620 Acute and chronic respiratory failure, unspecified whether with hypoxia or hypercapnia: Secondary | ICD-10-CM

## 2012-03-12 DIAGNOSIS — Y95 Nosocomial condition: Secondary | ICD-10-CM | POA: Diagnosis not present

## 2012-03-12 DIAGNOSIS — J449 Chronic obstructive pulmonary disease, unspecified: Secondary | ICD-10-CM

## 2012-03-12 DIAGNOSIS — I10 Essential (primary) hypertension: Secondary | ICD-10-CM | POA: Diagnosis present

## 2012-03-12 DIAGNOSIS — J9600 Acute respiratory failure, unspecified whether with hypoxia or hypercapnia: Secondary | ICD-10-CM

## 2012-03-12 DIAGNOSIS — J0410 Acute tracheitis without obstruction: Secondary | ICD-10-CM | POA: Diagnosis not present

## 2012-03-12 DIAGNOSIS — E1165 Type 2 diabetes mellitus with hyperglycemia: Secondary | ICD-10-CM | POA: Diagnosis present

## 2012-03-12 DIAGNOSIS — J90 Pleural effusion, not elsewhere classified: Secondary | ICD-10-CM | POA: Diagnosis present

## 2012-03-12 DIAGNOSIS — Z930 Tracheostomy status: Secondary | ICD-10-CM

## 2012-03-12 DIAGNOSIS — J80 Acute respiratory distress syndrome: Secondary | ICD-10-CM

## 2012-03-12 DIAGNOSIS — J209 Acute bronchitis, unspecified: Secondary | ICD-10-CM

## 2012-03-12 DIAGNOSIS — R001 Bradycardia, unspecified: Secondary | ICD-10-CM | POA: Diagnosis not present

## 2012-03-12 DIAGNOSIS — I498 Other specified cardiac arrhythmias: Secondary | ICD-10-CM

## 2012-03-12 LAB — CBC WITH DIFFERENTIAL/PLATELET
Basophils Relative: 2 % — ABNORMAL HIGH (ref 0–1)
Eosinophils Relative: 0 % (ref 0–5)
Hemoglobin: 12.4 g/dL (ref 12.0–15.0)
Lymphocytes Relative: 7 % — ABNORMAL LOW (ref 12–46)
MCH: 30 pg (ref 26.0–34.0)
Monocytes Absolute: 0.6 10*3/uL (ref 0.1–1.0)
Monocytes Relative: 6 % (ref 3–12)
Neutrophils Relative %: 85 % — ABNORMAL HIGH (ref 43–77)
Platelets: 256 10*3/uL (ref 150–400)
RBC: 4.14 MIL/uL (ref 3.87–5.11)
WBC: 9.7 10*3/uL (ref 4.0–10.5)

## 2012-03-12 LAB — GLUCOSE, CAPILLARY
Glucose-Capillary: 235 mg/dL — ABNORMAL HIGH (ref 70–99)
Glucose-Capillary: 188 mg/dL — ABNORMAL HIGH (ref 70–99)

## 2012-03-12 LAB — COMPREHENSIVE METABOLIC PANEL
ALT: 37 U/L — ABNORMAL HIGH (ref 0–35)
Alkaline Phosphatase: 54 U/L (ref 39–117)
BUN: 29 mg/dL — ABNORMAL HIGH (ref 6–23)
CO2: 41 mEq/L (ref 19–32)
GFR calc Af Amer: >90 mL/min (ref >90)
GFR calc non Af Amer: >90 mL/min (ref >90)
Glucose, Bld: 273 mg/dL — ABNORMAL HIGH (ref 70–99)
Potassium: 4.5 mEq/L (ref 3.5–5.1)
Sodium: 139 mEq/L (ref 135–145)
Total Bilirubin: 0.4 mg/dL (ref 0.3–1.2)
Total Protein: 6.9 g/dL (ref 6.0–8.3)

## 2012-03-12 LAB — BLOOD GAS, ARTERIAL
Drawn by: 331001
O2 Saturation: 92.6 %
PEEP: 5 cmH2O
Patient temperature: 97.8
RATE: 14 resp/min
pO2, Arterial: 72.6 mmHg — ABNORMAL LOW (ref 80.0–100.0)

## 2012-03-12 LAB — MAGNESIUM: Magnesium: 2 mg/dL (ref 1.5–2.5)

## 2012-03-12 MED ORDER — HEPARIN SODIUM (PORCINE) 5000 UNIT/ML IJ SOLN
5000.0000 [IU] | Freq: Three times a day (TID) | INTRAMUSCULAR | Status: DC
  Administered 2012-03-12: 5000 [IU] via SUBCUTANEOUS
  Filled 2012-03-12 (×3): qty 1

## 2012-03-12 MED ORDER — ISOSORBIDE MONONITRATE ER 30 MG PO TB24: 30.0000 mg | ORAL_TABLET | Freq: Every day | ORAL | Status: DC

## 2012-03-12 MED ORDER — LORAZEPAM 1 MG PO TABS
1.0000 mg | ORAL_TABLET | Freq: Three times a day (TID) | ORAL | Status: DC
  Administered 2012-03-12: 1 mg
  Filled 2012-03-12: qty 1

## 2012-03-12 MED ORDER — PRO-STAT SUGAR FREE PO LIQD: 60.0000 mL | Freq: Four times a day (QID) | ORAL | Status: DC

## 2012-03-12 MED ORDER — METHYLPREDNISOLONE SODIUM SUCC 125 MG IJ SOLR: 60.0000 mg | Freq: Two times a day (BID) | INTRAMUSCULAR | Status: DC

## 2012-03-12 MED ORDER — INSULIN GLARGINE 100 UNIT/ML ~~LOC~~ SOLN: 20.0000 [IU] | Freq: Every day | SUBCUTANEOUS | Status: DC

## 2012-03-12 MED ORDER — HYDRALAZINE HCL 20 MG/ML IJ SOLN
10.0000 mg | INTRAMUSCULAR | Status: DC | PRN
  Filled 2012-03-12: qty 0.5

## 2012-03-12 MED ORDER — BIOTENE DRY MOUTH MT LIQD: 15.0000 mL | Freq: Four times a day (QID) | OROMUCOSAL | Status: DC

## 2012-03-12 MED ORDER — HALOPERIDOL LACTATE 5 MG/ML IJ SOLN: 1.0000 mg | INTRAMUSCULAR | Status: DC | PRN

## 2012-03-12 MED ORDER — HYDRALAZINE HCL 50 MG PO TABS: 50.0000 mg | ORAL_TABLET | Freq: Three times a day (TID) | ORAL | Status: DC

## 2012-03-12 MED ORDER — HEPARIN SODIUM (PORCINE) 5000 UNIT/ML IJ SOLN: 5000.0000 [IU] | Freq: Three times a day (TID) | INTRAMUSCULAR | Status: DC

## 2012-03-12 MED ORDER — LACTULOSE 10 GM/15ML PO SOLN: 20.0000 g | Freq: Every day | ORAL | Status: DC

## 2012-03-12 MED ORDER — INSULIN ASPART 100 UNIT/ML ~~LOC~~ SOLN: 3.0000 [IU] | SUBCUTANEOUS | Status: DC

## 2012-03-12 MED ORDER — HYDRALAZINE HCL 50 MG PO TABS
50.0000 mg | ORAL_TABLET | Freq: Three times a day (TID) | ORAL | Status: DC
  Administered 2012-03-12: 50 mg
  Filled 2012-03-12 (×3): qty 1

## 2012-03-12 MED ORDER — SODIUM CHLORIDE 0.45 % IV SOLN: 10.0000 mL | INTRAVENOUS | Status: DC

## 2012-03-12 MED ORDER — ACETAMINOPHEN 160 MG/5ML PO SOLN: 325.0000 mg | Freq: Three times a day (TID) | ORAL | Status: DC | PRN

## 2012-03-12 MED ORDER — CHLORHEXIDINE GLUCONATE 0.12 % MT SOLN: 15.0000 mL | Freq: Two times a day (BID) | OROMUCOSAL | Status: DC

## 2012-03-12 MED ORDER — LORAZEPAM 1 MG PO TABS: 1.0000 mg | ORAL_TABLET | Freq: Three times a day (TID) | ORAL | Status: DC

## 2012-03-12 MED ORDER — ADULT MULTIVITAMIN LIQUID CH: 5.0000 mL | Freq: Every day | ORAL | Status: DC

## 2012-03-12 MED ORDER — ONDANSETRON HCL 4 MG/2ML IJ SOLN
4.0000 mg | Freq: Once | INTRAMUSCULAR | Status: AC
Start: 2012-03-12 — End: 2012-03-12
  Administered 2012-03-12: 4 mg via INTRAVENOUS

## 2012-03-12 MED ORDER — INSULIN ASPART 100 UNIT/ML ~~LOC~~ SOLN
3.0000 [IU] | SUBCUTANEOUS | Status: DC
  Administered 2012-03-12 (×4): 3 [IU] via SUBCUTANEOUS

## 2012-03-12 MED ORDER — RISPERIDONE 1 MG/ML PO SOLN: 2.0000 mg | Freq: Two times a day (BID) | ORAL | Status: DC

## 2012-03-12 MED ORDER — POTASSIUM CHLORIDE 20 MEQ/15ML (10%) PO LIQD: 40.0000 meq | Freq: Every day | ORAL | Status: DC

## 2012-03-12 MED ORDER — VANCOMYCIN HCL IN DEXTROSE 1-5 GM/200ML-% IV SOLN
1000.0000 mg | Freq: Three times a day (TID) | INTRAVENOUS | Status: DC
  Filled 2012-03-12 (×2): qty 200

## 2012-03-12 MED ORDER — OXEPA PO LIQD: 1000.0000 mL | ORAL | Status: DC

## 2012-03-12 MED ORDER — DOCUSATE SODIUM 50 MG/5ML PO LIQD
100.0000 mg | Freq: Every day | ORAL | Status: DC
  Administered 2012-03-12: 100 mg
  Filled 2012-03-12: qty 10

## 2012-03-12 MED ORDER — DOCUSATE SODIUM 50 MG/5ML PO LIQD: 50.0000 mg | Freq: Every day | ORAL | Status: DC

## 2012-03-12 MED ORDER — IPRATROPIUM-ALBUTEROL 18-103 MCG/ACT IN AERO: 4.0000 | INHALATION_SPRAY | Freq: Four times a day (QID) | RESPIRATORY_TRACT | Status: DC

## 2012-03-12 MED ORDER — FUROSEMIDE 10 MG/ML IJ SOLN
40.0000 mg | Freq: Once | INTRAMUSCULAR | Status: AC
  Administered 2012-03-12: 40 mg via INTRAVENOUS
  Filled 2012-03-12: qty 4

## 2012-03-12 MED ORDER — PANTOPRAZOLE SODIUM 40 MG PO PACK: 40.0000 mg | PACK | ORAL | Status: DC

## 2012-03-12 MED ORDER — METHYLPREDNISOLONE SODIUM SUCC 125 MG IJ SOLR
60.0000 mg | Freq: Two times a day (BID) | INTRAMUSCULAR | Status: DC
Start: 2012-03-12 — End: 2012-03-12
  Administered 2012-03-12: 60 mg via INTRAVENOUS
  Filled 2012-03-12 (×2): qty 0.96

## 2012-03-12 MED ORDER — ONDANSETRON HCL 4 MG/2ML IJ SOLN
INTRAMUSCULAR | Status: AC
  Administered 2012-03-12: 4 mg via INTRAVENOUS
  Filled 2012-03-12: qty 2

## 2012-03-12 MED ORDER — IPRATROPIUM-ALBUTEROL 18-103 MCG/ACT IN AERO: 4.0000 | INHALATION_SPRAY | RESPIRATORY_TRACT | Status: DC | PRN

## 2012-03-12 MED ORDER — ASPIRIN 81 MG PO CHEW: 81.0000 mg | CHEWABLE_TABLET | Freq: Every day | ORAL | Status: DC

## 2012-03-12 MED ORDER — DEXTROSE 5 % IV SOLN: 10.0000 mg/kg | Freq: Three times a day (TID) | INTRAVENOUS | Status: DC

## 2012-03-12 MED ORDER — INSULIN ASPART 100 UNIT/ML ~~LOC~~ SOLN: 0.0000 [IU] | SUBCUTANEOUS | Status: DC

## 2012-03-12 MED ORDER — SODIUM CHLORIDE 0.9 % IV SOLN: 500.0000 mg | Freq: Four times a day (QID) | INTRAVENOUS | Status: DC

## 2012-03-12 MED ORDER — RISPERIDONE 1 MG/ML PO SOLN
2.0000 mg | Freq: Two times a day (BID) | ORAL | Status: DC
  Filled 2012-03-12: qty 2

## 2012-03-12 MED ORDER — FREE WATER: 200.0000 mL | Freq: Three times a day (TID) | Status: DC

## 2012-03-12 MED ORDER — LACTULOSE 10 GM/15ML PO SOLN
20.0000 g | Freq: Every day | ORAL | Status: DC
  Administered 2012-03-12: 20 g
  Filled 2012-03-12: qty 30

## 2012-03-12 MED ORDER — HYDRALAZINE HCL 20 MG/ML IJ SOLN: 10.0000 mg | INTRAMUSCULAR | Status: DC | PRN

## 2012-03-12 NOTE — Progress Notes (Signed)
Clinical Social Work Department BRIEF PSYCHOSOCIAL ASSESSMENT 03/12/2012  Patient:  [name], [name]     Account Number:  [id number]     Admit date:  02/29/2012  Clinical Social Worker:  Dennison Bulla  Date/Time:  03/12/2012 12:30 PM  Referred by:  Physician  Date Referred:  03/12/2012 Referred for  SNF Placement   Other Referral:   Interview type:  Patient Other interview type:    PSYCHOSOCIAL DATA Living Status:  FAMILY Admitted from facility:   Level of care:   Primary support name:  Brett Canales Primary support relationship to patient:  SPOUSE Degree of support available:   Strong    CURRENT CONCERNS Current Concerns  Post-Acute Placement   Other Concerns:    SOCIAL WORK ASSESSMENT / PLAN CSW received referral to assist with SNF placement. Per CM, patient not eligible for LTAC due to insurance and may need SNF placement. CM reports she will speak with family regarding home vent.    CSW met with patient at bedside. Per RN, patient has trach but unable to wean from vent at this time. Patient was able to speak with CSW and write notes. CSW introduced myself and explained role. Patient reports that she lives with husband and that he can provide 24 hour care because he does not work. CSW spoke with patient about her ability to care for trach and the possible need for SNF placement. CSW explained that few facilities are able to accommodate vents but explained process. Patient reports she would prefer to return home if possible. CSW offered to find facilities with available beds in case SNF is needed. CSW explained that search often includes [place] and [place]. Patient is agreeable to SNF search.    CSW completed FL2 and submitted for pasarr. CSW submitted information to [facility] for Medicaid authorization. CSW spoke with [facility] who stated they would review referral. CSW spoke with [facility] who reports no beds at this time but possibly a bed to open in the future. CSW agreed to send referral  and SNF will keep referral in the event that a bed becomes available. CSW spoke with [facility] who reports one female bed available at this time. CSW sent referrals to all facilities and will continue to follow.   Assessment/plan status:  Psychosocial Support/Ongoing Assessment of Needs Other assessment/ plan:   Information/referral to community resources:   SNF information    PATIENT'S/FAMILY'S RESPONSE TO PLAN OF CARE: Patient alert and oriented. Patient agreeable to CSW consult and engaged throughout assessment. Patient prefers to return home but is agreeable to SNF search as alternative option.

## 2012-03-12 NOTE — Progress Notes (Signed)
Tolerated well

## 2012-03-12 NOTE — Progress Notes (Signed)
Clinical Social Work  CSW met with patient, husband and dtr at bedside. Dtr reports that patient has Medicare. CSW spoke with financial counselors who report that patient has Medicare # [id number]. CSW made CM aware who placed LTAC consult. CSW informed family of SNF option if LTAC unavailable.  [place], [place] [phone]

## 2012-03-12 NOTE — Progress Notes (Signed)
CRITICAL VALUE ALERT  Critical value received:  CO2 41  Date of notification:  03/12/2012  Time of notification:  6:23 AM  Critical value read back:yes  Nurse who received alert:  Vassie Moselle  MD notified (1st page):  Shirlee Latch- ccm resident  Time of first page:  6:24 AM  MD notified (2nd page):  Time of second page:  Responding MD:  6:24 AM  Time MD responded:  6:24 AM

## 2012-03-12 NOTE — Progress Notes (Signed)
NUTRITION FOLLOW UP  Intervention:    Continue Oxepa at 15 ml/h with Prostat 60 ml QID to provide 1340 kcals (23 kcals/kg ideal weight), 143 gm protein (99% estimated needs), 283 ml free water daily.   Continue liquid MVI via tube daily to help meet 100% DRI's.  Nutrition Dx:   Inadequate oral intake related to inability to eat as evidenced by NPO status, ongoing.  Goal:   Enteral nutrition to provide 60-70% of estimated calorie needs (22-25 kcals/kg ideal body weight) and >/= 90% of estimated protein needs, based on ASPEN guidelines for permissive underfeeding in critically ill obese individuals, met.  Monitor:   TF tolerance/adequacy, weight trend, labs, I/O, vent status  Assessment:   S/P tracheostomy 1/6.  Patient remains on ventilator support.  MV: 11.4 Temp:Temp (24hrs), Avg:98.7 F (37.1 C), Min:97.4 F (36.3 C), Max:100.4 F (38 C)   Plans for percutaneous G tube in IR 1/9.  Barium still in stomach this morning, so procedure was re-scheduled for tomorrow.  TF has been off for procedure.   Height: Ht Readings from Last 1 Encounters:  03/01/12 5' 5.5" (1.664 m)    Weight Status:   Wt Readings from Last 1 Encounters:  03/12/12 241 lb 13.5 oz (109.7 kg)  1/2 113 kg  Re-estimated needs:  Kcal: 2115 Protein: >144 gm Fluid: 2.1-2.3 L  Skin: no problems noted  Diet Order:  Oxepa at 15 ml/h with Prostat 60 ml QID providing 1340 kcals, 143 gm protein, 283 ml free water daily. Liquid MVI daily. Free water flushes:  200 ml every 8 hours   Intake/Output Summary (Last 24 hours) at 03/12/12 1102 Last data filed at 03/12/12 0800  Gross per 24 hour  Intake   2166 ml  Output   1525 ml  Net    641 ml    Last BM: 1/8   Labs:   Lab 03/12/12 0440 03/11/12 0410 03/10/12 0458  NA 139 144 142  K 4.5 3.4* 4.1  CL 92* 92* 99  CO2 41* >45* 40*  BUN 29* 31* 23  CREATININE 0.77 0.80 0.73  CALCIUM 9.1 9.0 8.7  MG 2.0 1.8 2.2  PHOS 3.6 3.5 2.6  GLUCOSE 273* 73 220*      CBG (last 3)   Basename 03/12/12 0713 03/12/12 0334 03/11/12 2324  GLUCAP 230* 235* 400*    Scheduled Meds:   . acyclovir  10 mg/kg (Ideal) Intravenous Q8H  . albuterol-ipratropium  4 puff Inhalation Q6H  . antiseptic oral rinse  15 mL Mouth Rinse QID  . aspirin  81 mg Oral Daily  . chlorhexidine  15 mL Mouth Rinse BID  . docusate  100 mg Per Tube Daily  . feeding supplement (OXEPA)  1,000 mL Per Tube Q24H  . feeding supplement  60 mL Per Tube QID  . fentaNYL  200 mcg Intravenous Once  . free water  200 mL Per Tube Q8H  . heparin subcutaneous  5,000 Units Subcutaneous Q8H  . imipenem-cilastatin  500 mg Intravenous Q6H  . insulin aspart  0-20 Units Subcutaneous Q4H  . insulin aspart  3 Units Subcutaneous Q4H  . insulin glargine  20 Units Subcutaneous QHS  . isosorbide mononitrate  30 mg Oral Daily  . lactulose  20 g Per Tube Daily  . methylPREDNISolone (SOLU-MEDROL) injection  60 mg Intravenous Q12H  . metoprolol tartrate  12.5 mg Per Tube Q12H  . multivitamin  5 mL Per Tube Daily  . pantoprazole sodium  40 mg Per  Tube Q24H  . potassium chloride  40 mEq Oral Daily  . risperiDONE  1 mg Per Tube Q12H  . vancomycin  1,000 mg Intravenous Q12H    Continuous Infusions:   . sodium chloride 50 mL/hr at 03/11/12 1800  . dexmedetomidine 1 mcg/kg/hr (03/12/12 1191)    Joaquin Courts, RD, LDN, CNSC Pager# [phone] After Hours Pager# [phone]

## 2012-03-12 NOTE — Progress Notes (Signed)
Clinical Social Work Department CLINICAL SOCIAL WORK PLACEMENT NOTE 03/12/2012  Patient:  [name], [name]  Account Number:  [id number] Admit date:  02/29/2012  Clinical Social Worker:  Unk Lightning, LCSW  Date/time:  03/12/2012 12:00 N  Clinical Social Work is seeking post-discharge placement for this patient at the following level of care:   SKILLED NURSING   (*CSW will update this form in Epic as items are completed)   03/12/2012  Patient/family provided with [facility] Department of Clinical Social Work's list of facilities offering this level of care within the geographic area requested by the patient (or if unable, by the patient's family).  03/12/2012  Patient/family informed of their freedom to choose among providers that offer the needed level of care, that participate in Medicare, Medicaid or managed care program needed by the patient, have an available bed and are willing to accept the patient.  03/12/2012  Patient/family informed of [facility]' ownership interest in [facility], as well as of the fact that they are under no obligation to receive care at this facility.  PASARR submitted to [employer] on 03/12/2012 PASARR number received from [employer] on 03/12/2012  FL2 transmitted to all facilities in geographic area requested by pt/family on  03/12/2012 FL2 transmitted to all facilities within larger geographic area on   Patient informed that his/her managed care company has contracts with or will negotiate with  certain facilities, including the following:     Patient/family informed of bed offers received:   Patient chooses bed at  Physician recommends and patient chooses bed at    Patient to be transferred to  on   Patient to be transferred to facility by   The following physician request were entered in Epic:   Additional Comments:

## 2012-03-12 NOTE — Discharge Summary (Signed)
Internal Medicine Teaching [facility] Discharge Note  Name: [name] MRN: [id number] DOB: 1955-03-15 57 y.o.  Date of Admission: 02/29/2012 10:34 AM Date of Discharge: 03/12/2012 Attending Physician: Alyson Reedy, MD  Discharge Diagnosis: 1. Pulmonary  *Acute-on-chronic respiratory failure Flu syndrome H1N1 influenza Pneumococcal bronchitis HAP (hospital-acquired pneumonia) ARDS (adult respiratory distress syndrome) Pulmonary edema Pleural effusion, bilateral COPD exacerbation-no pfts on file [name] or [name] Long Tracheitis Tobacco abuse Obesity hypoventilation syndrome, obstructive sleep apnea suspected   2. CARDIOVASCULAR  History of hypertension with hypotension Intermittent Tachycardia and bradycardia History of CAD History of Hyperlipidemia   3. RENAL  Metabolic alkalosis secondary to respiratory acidosis  Electrolyte abnormalities   4. GASTROINTESTINAL  Ileus History of GERD  Nutrition Elevated liver enzymes, improving Bowel regimen  GI prophylaxis   5. HEMATOLOGIC  Thrombocytopenia resolved. DVT prophylaxis   6. INFECTIOUS  H1N1, Flu A, Pneumococcal Ag positive.  Febrile  7. ENDOCRINE   Diabetes mellitus type 2 with hyperglycemia and hypoglycemia this admission  8. NEUROLOGIC  Resolved acute encephalopathy secondary to hypercapnea/hypoxia.  Discharge Medications:   Medication List     As of 03/12/2012  5:08 PM    STOP taking these medications         aspirin 81 MG tablet      budesonide-formoterol 160-4.5 MCG/ACT inhaler   Commonly known as: SYMBICORT      cyclobenzaprine 10 MG tablet   Commonly known as: FLEXERIL      DELSYM PO      furosemide 40 MG tablet   Commonly known as: LASIX      Hydrocodone-Acetaminophen 5-300 MG Tabs      insulin NPH-insulin regular (70-30) 100 UNIT/ML injection   Commonly known as: NOVOLIN 70/30      metFORMIN 1000 MG tablet   Commonly known as: GLUCOPHAGE      metoprolol  succinate 50 MG 24 hr tablet   Commonly known as: TOPROL-XL      olmesartan 40 MG tablet   Commonly known as: BENICAR      rOPINIRole 2 MG tablet   Commonly known as: REQUIP      VENTOLIN HFA 108 (90 BASE) MCG/ACT inhaler   Generic drug: albuterol      TAKE these medications         acetaminophen 160 MG/5ML solution   Commonly known as: TYLENOL   Place 10.2 mLs (325 mg total) into feeding tube every 8 (eight) hours as needed for fever.      albuterol-ipratropium 18-103 MCG/ACT inhaler   Commonly known as: COMBIVENT   Inhale 4 puffs into the lungs every 6 (six) hours.      albuterol-ipratropium 18-103 MCG/ACT inhaler   Commonly known as: COMBIVENT   Inhale 4 puffs into the lungs every 2 (two) hours as needed for wheezing.      antiseptic oral rinse Liqd   15 mLs by Mouth Rinse route QID.      aspirin 81 MG chewable tablet   Chew 1 tablet (81 mg total) by mouth daily.      chlorhexidine 0.12 % solution   Commonly known as: PERIDEX   Use as directed 15 mLs in the mouth or throat 2 (two) times daily.      dextrose 5 % SOLN 100 mL with acyclovir 50 MG/ML SOLN 580 mg   Inject 580 mg into the vein every 8 (eight) hours.      docusate 50 MG/5ML liquid   Commonly known as: COLACE  Place 5 mLs (50 mg total) into feeding tube daily.      feeding supplement (OXEPA) Liqd   Place 1,000 mLs into feeding tube daily.      feeding supplement Liqd   Place 60 mLs into feeding tube 4 (four) times daily.      free water Soln   Place 200 mLs into feeding tube every 8 (eight) hours.      haloperidol lactate 5 MG/ML injection   Commonly known as: HALDOL   Inject 0.2 mLs (1 mg total) into the vein every 4 (four) hours as needed.      heparin 5000 UNIT/ML injection   Inject 1 mL (5,000 Units total) into the skin every 8 (eight) hours.      hydrALAZINE 50 MG tablet   Commonly known as: APRESOLINE   Place 1 tablet (50 mg total) into feeding tube every 8 (eight) hours.       hydrALAZINE 20 MG/ML injection   Commonly known as: APRESOLINE   Inject 0.5 mLs (10 mg total) into the vein every 4 (four) hours as needed (SBP>150).      insulin aspart 100 UNIT/ML injection   Commonly known as: novoLOG   Inject 3 Units into the skin every 4 (four) hours.      insulin aspart 100 UNIT/ML injection   Commonly known as: novoLOG   Inject 0-20 Units into the skin every 4 (four) hours.      insulin glargine 100 UNIT/ML injection   Commonly known as: LANTUS   Inject 20 Units into the skin at bedtime.      isosorbide mononitrate 30 MG 24 hr tablet   Commonly known as: IMDUR   Take 1 tablet (30 mg total) by mouth daily.      lactulose 10 GM/15ML solution   Commonly known as: CHRONULAC   Place 30 mLs (20 g total) into feeding tube daily.      LORazepam 1 MG tablet   Commonly known as: ATIVAN   Place 1 tablet (1 mg total) into feeding tube every 8 (eight) hours.      methylPREDNISolone sodium succinate 125 mg/2 mL injection   Commonly known as: SOLU-MEDROL   Inject 0.96 mLs (60 mg total) into the vein every 12 (twelve) hours.      multivitamin Liqd   Place 5 mLs into feeding tube daily.      pantoprazole sodium 40 mg/20 mL Pack   Commonly known as: PROTONIX   Place 20 mLs (40 mg total) into feeding tube daily.      potassium chloride 20 MEQ/15ML (10%) solution   Take 30 mLs (40 mEq total) by mouth daily.      risperiDONE 1 MG/ML oral solution   Commonly known as: RISPERDAL   Place 2 mLs (2 mg total) into feeding tube every 12 (twelve) hours.      sodium chloride 0.45 % solution   Inject 10-20 mLs into the vein continuous.      sodium chloride 0.9 % SOLN 100 mL with imipenem-cilastatin 500 MG SOLR 500 mg   Inject 500 mg into the vein every 6 (six) hours.          Disposition and follow-up:   [name] was discharged from [facility] in stable condition.  At the hospital follow up visit please address:  1) Follow up blood  cultures, BAL studies, HSV from bronchoscopy 2) If HSV negative discontinue Acyclovir 3) Treat with Primaxin until 03/18/12 4) PEG  1/9, f/u KUB 1/9  Follow-up Appointments:  Discharge Orders    Future Orders Please Complete By Expires   Discharge instructions      Comments:   Thoroughly read discharge summary   Increase activity slowly         Consultations: Critical Care-Dr. Molli Knock, Dr. Tyson Alias, Dr. Craige Cotta Interventional radiology   Procedures Performed:  Dg Chest 2 View  02/29/2012  *RADIOLOGY REPORT*  Clinical Data: Dyspnea  CHEST - 2 VIEW  Comparison: 11/19/2010  Findings: Vascular clips in the upper abdomen.  Coarse interstitial opacities in the lung bases as before.  No confluent airspace infiltrate or overt edema.  No effusion.  Heart size normal for technique.  Mild aortic plaque.  Spurring in the lower thoracic spine.  IMPRESSION:  1.  Stable chronic changes.  No acute disease.   Original Report Authenticated By: D. Andria Rhein, MD    Dg Chest Port 1 View  03/11/2012  *RADIOLOGY REPORT*  Clinical Data: Tracheostomy, respiratory failure  PORTABLE CHEST - 1 VIEW  Comparison: 03/10/2012; 03/09/2012; 03/08/2012  Findings: Grossly unchanged cardiac silhouette and mediastinal contours.  Stable position of support apparatus.  Grossly unchanged possible trace bilateral effusions and bibasilar heterogeneous opacities, left greater than right.  Pulmonary vasculature remains indistinct.  No pneumothorax.  Unchanged bones.  IMPRESSION: 1.  Stable positioning of support apparatus.  No pneumothorax. 2.  Grossly unchanged findings of mild pulmonary edema, with bilateral effusions and bibasilar opacities, left greater than right, atelectasis versus infiltrate.   Original Report Authenticated By: Tacey Ruiz, MD    Chest Portable 1 View To Assess Tube Placement And Rule-out Pneumothorax  03/10/2012  *RADIOLOGY REPORT*  Clinical Data: Tube placement.  Evaluate for pneumothorax.  PORTABLE CHEST - 1  VIEW  Comparison: Multiple recent previous exams.  Findings: 1431 hours.  Endotracheal tube is no longer visible. Tracheostomy tube is new in the interval. The NG tube passes into the stomach although the distal tip position is not included on the film. The cardiopericardial silhouette is enlarged.  There is a left greater than right bibasilar atelectasis.  No evidence for pneumothorax. Telemetry leads overlie the chest.  IMPRESSION: No evidence for pneumothorax status post tracheostomy tube placement.  Left greater than right bibasilar atelectasis or infiltrate.   Original Report Authenticated By: Kennith Center, M.D.    Dg Chest Port 1 View  03/09/2012  *RADIOLOGY REPORT*  Clinical Data: Assessed basilar atelectasis  PORTABLE CHEST - 1 VIEW  Comparison: Portable exam 0510 hours compared to 03/08/2012  Findings: Endotracheal tube tip approximately 4.0 cm above carina. Nasogastric tube extends into abdomen. Minimal enlargement of cardiac silhouette. Atherosclerotic calcification aorta. Pulmonary vascularity normal. Persistent bibasilar opacities question atelectasis versus consolidation, greater on right. Upper lungs clear. No definite pleural effusion or pneumothorax.  IMPRESSION: Persistent bibasilar opacities right greater than left question atelectasis versus infiltrate.   Original Report Authenticated By: Ulyses Southward, M.D.    Dg Chest Port 1 View  03/08/2012  *RADIOLOGY REPORT*  Clinical Data: Acute respiratory failure on ventilator.  COPD.  PORTABLE CHEST - 1 VIEW  Comparison: 03/07/2012  Findings: Bilateral lower lobe airspace disease shows no significant change.  Heart size is stable.  Endotracheal tube and nasogastric remain in appropriate position.  IMPRESSION: Stable bilateral lower lobe airspace disease.   Original Report Authenticated By: Myles Rosenthal, M.D.    Dg Chest Port 1 View  03/07/2012  *RADIOLOGY REPORT*  Clinical Data: Evaluate endotracheal tube, pneumonia, CHF  PORTABLE CHEST - 1 VIEW  Comparison: 03/06/2012; 03/05/2012; 02/29/2012; 11/18/2009  Findings: Grossly unchanged borderline enlarged cardiac silhouette and mediastinal contours.  Stable position of support apparatus. Bibasilar heterogeneous opacities are grossly unchanged.  Mild pulmonary venous congestion without frank evidence of edema.  Trace bilateral effusions are unchanged.  No definite pneumothorax. Unchanged bones.  IMPRESSION: 1.  Stable positioning of support apparatus.  No pneumothorax. 2.  Grossly unchanged trace bilateral effusions and bibasilar opacities, atelectasis versus infiltrate.   Original Report Authenticated By: Tacey Ruiz, MD    Dg Chest Port 1 View  03/06/2012  *RADIOLOGY REPORT*  Clinical Data: Check endotracheal tube position.  PORTABLE CHEST - 1 VIEW  Comparison: 03/05/2012.  Findings: Endotracheal tube is in satisfactory position. Nasogastric tube is followed into the stomach.  Heart is mildly enlarged, stable.  Mild diffuse interstitial prominence and indistinctness with bibasilar atelectasis.  There may be bilateral pleural effusions.  IMPRESSION: Favor mild pulmonary edema with bibasilar atelectasis and probable small bilateral pleural effusions.  Pneumonia not excluded.   Original Report Authenticated By: Leanna Battles, M.D.    Dg Chest Port 1 View  03/05/2012  *RADIOLOGY REPORT*  Clinical Data: Follow up of pneumonia.  Hypertension.  Diabetes.  PORTABLE CHEST - 1 VIEW  Comparison: 1 day prior  Findings: Endotracheal tube 2.9 cm above carina.  Nasogastric tube extends beyond the  inferior aspect of the film.  Cardiomegaly accentuated by AP portable technique.  Cannot exclude small bilateral pleural effusions. No pneumothorax.  Low lung volumes. Decreased left and similar right base air space disease.  IMPRESSION: Persistent low lung volumes with minimal improvement in left base aeration.  Otherwise, similar bibasilar atelectasis versus infection.   Original Report Authenticated By: Jeronimo Greaves, M.D.      Dg Chest Port 1 View  03/04/2012  *RADIOLOGY REPORT*  Clinical Data: Status post intubation.  PORTABLE CHEST - 1 VIEW  Comparison: 03/02/2012  Findings: 1824 hours.  Endotracheal tube tip is 2.8 cm above the base of the carina.  There is bibasilar atelectasis/infiltrate, left greater than right. The cardiopericardial silhouette is enlarged. Telemetry leads overlie the chest.  IMPRESSION: Endotracheal tube tip is approximately 3 cm above the base of the carina.  Bibasilar atelectasis or infiltrate, left greater than right.   Original Report Authenticated By: Kennith Center, M.D.    Dg Chest Port 1 View  03/02/2012  *RADIOLOGY REPORT*  Clinical Data: Follow up atelectasis  PORTABLE CHEST - 1 VIEW  Comparison: 02/29/2012  Findings: Normal heart size.  No pleural effusion or edema.  There is eventration of the right hemidiaphragm.  Scar like densities are noted in the left base.  No airspace consolidation noted.  IMPRESSION:  1.  Left base scarring. 2.  No acute disease.   Original Report Authenticated By: Signa Kell, M.D.    Dg Abd Portable 1v  03/12/2012  *RADIOLOGY REPORT*  Clinical Data: Evaluate barium prior to gastrostomy tube placement.  PORTABLE ABDOMEN - 1 VIEW  Comparison: None.  Findings: Image quality is degraded by motion.  Barium is seen in the stomach and what appears to be proximal small bowel.  There are dilated loops of small bowel and possibly colon.  IMPRESSION:  1.  Image quality is rather degraded by motion. 2.  Retained contrast is seen predominantly in the stomach and proximal small bowel. 3.  Dilated small bowel loops and likely colonic loops as well, indicative of ileus.  Small bowel obstruction cannot be definitively excluded.   Original Report Authenticated By: Leanna Battles, M.D.  2D Echo:  03/07/12 echo: Impressions:  - Limited study to R/O apical thrombus; definity used; no obvious apical thrombus. Transthoracic echocardiography. Limited 2D and intravenous contrast  injection. Height: Height: 166.4cm. Height: 65.5in. Weight: Weight: 111.9kg. Weight: 246.2lb. Body mass index: BMI: 40.4kg/m^2. Body surface area: BSA: 2.57m^2. Blood pressure: 128/72. Patient status: Inpatient. Location: ICU/CCU    03/06/12 echo Study Conclusions  - Left ventricle: Echo dencse apical mass that may represent a calcified false tendon or a calcified thrombus. Clinical correlatio recommended. The cavity size was normal. Systolic function was normal. The estimated ejection fraction was in the range of 50% to 55%. - Right ventricle: The cavity size was moderately dilated. Wall thickness was normal. Systolic function was reduced. - Pulmonary arteries: Systolic pressure was mildly increased. PA peak pressure: 32mm Hg (S). Transthoracic echocardiography. M-mode, complete 2D, spectral Doppler, and color Doppler. Height: Height: 166.4cm. Height: 65.5in. Weight: Weight: 113kg. Weight: 248.6lb. Body mass index: BMI: 40.8kg/m^2. Body surface area: BSA: 2.43m^2. Blood pressure: 139/89. Patient status: Inpatient. Location: ICU/CCU  Admission HPI: MARGIT BATTE is a 57 y.o. female with history of COPD on chronic home O2 at 3 L per minute, ongoing tobacco abuse, morbid obesity, gastroesophageal reflux disease, hypertension, type 2 diabetes, who presents to the ED with a 2 day history of worsening shortness of breath. Patient states that the oxygen had been clamped off for the past 3 days and should have no idea about it. Patient stated that 2 days prior to admission developed severe worsening shortness of breath and some associated wheezing subjective fevers chills, nonproductive cough, some chest discomfort secondary to excessive coughing, generalized weakness, and a feeling that the blood in the heart was surgeon. Patient denies any constipation no ongoing chest pain, no nausea, no vomiting, no constipation. Patient does endorse some loose stools. Patient denies any sore throat, no  rhinorrhea. Patient stated that she tried a dual nebs at home with some improvement. Patient stated that his symptoms worsened to the point that her breathing was labored and she thought she was going to die and a such called EMS and was brought to the ED. Patient was given some IV Solu-Medrol on route and placed on nebulizer treatments. In the ED patient is currently on 6 L nasal cannula with some improvement in her shortness of breath since presentation. Patient using the accessory muscles of respiration. Labs obtained in the ED included a basic metabolic profile which have a sodium of 131 chloride of 90 even of 28 glucose of 294 otherwise was within normal limits. Troponin was less than 0.30. Pro BNP was 1012. CBC obtained was within normal limits. Chest x-ray obtained was negative for any acute infiltrate. Patient was given some nebulizer treatments. Will call to admit the patient for further evaluation and management.  She was admitted in acute on chronic respiratory failure on basis of acute exacerbation of COPD and mild hypotension.  She was admitted to pulmonary critical care for further management.    Hospital Course by problem list: 1. Pulmonary  *Acute-on-chronic respiratory failure Flu syndrome H1N1 influenza Pneumococcal bronchitis HAP (hospital-acquired pneumonia) ARDS (adult respiratory distress syndrome) Pulmonary edema Pleural effusion, bilateral COPD exacerbation Tracheitis Tobacco abuse Obesity hypoventilation syndrome, obstructive sleep apnea suspected   2. CARDIOVASCULAR  History of hypertension with hypotension Intermittent Tachycardia and bradycardia History of CAD History of Hyperlipidemia   3. RENAL  Metabolic alkalosis secondary to respiratory acidosis  Electrolyte abnormalities   4. GASTROINTESTINAL  Ileus History of GERD  Nutrition Elevated liver  enzymes, improving Bowel regimen  GI prophylaxis   5. HEMATOLOGIC  Thrombocytopenia resolved. DVT  prophylaxis   6. INFECTIOUS  H1N1, Flu A, Pneumococcal Ag positive.  Febrile  7. ENDOCRINE   Diabetes mellitus type 2 with hyperglycemia and hypoglycemia this admission  8. NEUROLOGIC  Resolved acute encephalopathy secondary to hypercapnea/hypoxia.   1. PULMONARY  57 y.o female 40 pack year smoker with prolonged hospital course with acute exacerbation of COPD (no pfts on home O2 2 L) due to influenza A, H1N1, pneumococcal pneumonia in the setting of suspected health care associated pneumonia leading to acute on chronic respiratory failure.  She also had ARDS this admission with PaO2/FiO2 ratio of 185 and bilateral infiltrates.  She had a bronchoscopy this admission with Dr. Tyson Alias which showed tracheitis, inflammation, ulceration in her airway and has pending HSV and BAL studies.  She was intubated 12/31 due to acute on chronic respiratory failure and extubated 1/6 with tracheostomy placement 03/10/12 due to difficulty weaning mechanical ventilation.  Most recent chest Xray 1/7 with mild pulmonary edema, with bilateral effusions and bibasilar opacities, left greater than right, atelectasis versus infiltrate.  She likely has COPD, obstructive sleep apnea, obesity hypoventilation syndrome.  She was given Combivent.  She was started on steroids oral then transitioned to Solumedrol which was titrated up.  She was given Levaquin 12/27>>>1/4, Ceftazidime 1/4>>>1/7, Tamiflu 12/27>>>1/7.  She is still getting Acyclovir empirically for possible HSV noted on bronchoscopy.  Once bronchoscopy is negative for HSV, Acyclovir can be discontinued.  She is on Primaxin since 03/11/12 and Vancomycin since 1/4.  She continues to spike fevers which are trending down after adding Primaxin which should be continued for 7-8 more days.  She was intermittently given Lasix for diuresis this admission.    CARDIOVASCULAR  History of hypertension with hypotensive episode on admission likely secondary to hypovelemia.  Blood  pressure is stable currently .  She has a history of CAD, hyperlipidemia.  She was intermittently bradycardic and tachycardic this admission.  We avoided Benicar in her this admission.  We discontinued Lopressor to avoid with bronchospasm 1/8.  We added Hydralazine 50 mg every 8 hours and as needed Hydralazine 10 mg every 4 hours.  We continued Imdur, Aspirin.  We monitored strict intake and output and daily weights and intermittently diuresed with Lasix this admission.  She has a history of hyperlipidema.  Echo 1/2 without contrast with EF 50-55%, ?apical mass, calcified false tendons/thrombus, RV moderately increased size, systolic pressure mildly elevated but repeat echo with contrast with no mention of apical mass.     RENAL  Intermittent electrolyte abnormalities this admission (resolved hyponatremia, hypernatremia, hypokalemia, hyperkalemia, hypomagnesemia (1.6).  Electrolytes were within normal limits 1/8 and repleted as needed this admission.  She was given Lasix intermittently this admission for negative output.  She had a metabolic alkalosis secondary to respiratory acidosis this admission.  She was given Diamox, Lasix this admission for diuresis though scheduled Lasix was discontinued due to slight worsening in metabolic alkalosis.    GASTROINTESTINAL  Possible ileus noted on KUB 03/12/12 still having bowel movements.  1/8 bowel movement with hard small stool with jelly appearance with soap suds enema, increased Colace 100 mg and Lactulose 20 mg.  Will decrease Colace, Lactulose doses.  Tube feeds on hold due to ileus.  Previous tube feeds with Opexa and Prostat.  Pending KUB 03/13/12 before PEG placement.  Initial PEG 1/8 was held due to barium noted in stomach on KUB.  Continue Protonix.  She had elevated liver function tests which are trending down.     HEMATOLOGIC  No issues. Thrombocytopenia resolved. DVT prophylaxis with subcutaneous heparin.    INFECTIOUS  H1N1, Flu A, Pneumococcal Ag  positive.  Spiking fevers this admission with T max 100.4 within the last 24 hours.  Fevers improved with Primaxin.  1/4 blood cultures negative to date and pending, pending BAL studies (HSV pending, BAL quantitative 1/6 without organism, no growth).  Continue Imipenem, Acyclovir empirically pending HSV studies. Consider discontinuation of Vancomycin.  Will continue Imipenem for 7-8 days.   ENDOCRINE  History of DM type II with hyperglycemia likely secondary to steroids this admission.  She also had hypoglycemic episodes this admission.  Currently on sliding scale, lantus 20 units, Novolog 3 units q 4 hours.    NEUROLOGIC  Resolved acute encephalopathy secondary to hypercapnea/hypoxia. She is more alert, less agitated. RASS 0. CAM negative on day of discharge.  Should be off Precedex,Fentanyl, Versed as needed.  As needed Haldol.  Increased Risperdal 1 mg q12 hours to 2 mg q 12 hours.  She is anxious so added Ativan 1 mg q8 hours.    Overall Summary:  Acute on chronic respiratory failure with ARDS, pneumonia, H1N1, influenza A.  We are empirically treating with Acyclovir pending HSV studies, fever trending down with addition on Imipenem, pending BAL studies, KUB concerning for ileus but patient had stool 03/12/12, 1/9 place peg for nutrition, hyperglycemic 1/8 added q4 hr 3 units insulin. Monitor for hypoglycemia. Will transfer to [facility] for further management.     Discharge Vitals:  BP 104/66  Pulse 118  Temp 98 F (36.7 C) (Oral)  Resp 24  Ht 5' 5.5" (1.664 m)  Wt 241 lb 13.5 oz (109.7 kg)  BMI 39.63 kg/m2  SpO2 100%  Discharge PHYSICAL EXAMINATION:  General: lying in bed, awake, following commands  Neuro: follows commands  HEENT: trach intact, [place]/at  Cardiovascular: RRR, no murmur  Lungs: ctab  Abdomen: Soft, non tender, mildly distended, obese, hypoactive bs  Musculoskeletal: no edema, warm lower extremities, no cyanosis  Skin: no rashes or breakdown   Discharge Labs:  Results for  FAYOLA, MECKES (MRN [id number]) as of 03/12/2012 15:07  Ref. Range 03/06/2012 05:00 03/06/2012 13:21 03/10/2012 15:05 03/11/2012 04:02  Sample type No range found ARTERIAL DRAW ARTERIAL ARTERIAL DRAW ARTERIAL  Delivery systems No range found VENTILATOR  VENTILATOR   FIO2 No range found 0.40  0.40   Mode No range found PRESSURE REGULATED VOLUME CONTROL  PRESSURE CONTROL   VT No range found 500     Peep/cpap No range found 5.0  5.0   Pressure control No range found   25   pH, Arterial Latest Range: 7.350-7.450  7.328 (L) 7.370 7.415 7.493 (H)  pCO2 arterial Latest Range: 35.0-45.0 mmHg 61.9 (HH) 55.5 (H) 68.9 (HH) 69.9 (HH)  pO2, Arterial Latest Range: 80.0-100.0 mmHg 72.8 (L) 74.0 (L) 70.0 (L) 64.0 (L)  Bicarbonate Latest Range: 20.0-24.0 mEq/L 31.6 (H) 31.9 (H) 43.4 (H) 53.6 (H)  TCO2 Latest Range: 0-100 mmol/L 33.5 34 45.5 >50  Acid-Base Excess Latest Range: 0.0-2.0 mmol/L 5.9 (H) 5.0 (H) 17.6 (H) 25.0 (H)  O2 Saturation No range found 92.1 93.0 91.9 92.0  Patient temperature No range found 98.6 99.5 F 98.6 98.6 F  Collection site No range found A-LINE RADIAL, ALLEN'S TEST ACCEPTABLE LEFT RADIAL RADIAL, ALLEN'S TEST ACCEPTABLE  Allens test (pass/fail) Latest Range: PASS    PASS    Results for Conery,  VALERYA MAXTON (MRN [id number]) as of 03/12/2012 15:07  Ref. Range 03/04/2012 17:34 03/04/2012 20:10 03/04/2012 22:22 03/05/2012 03:45 03/05/2012 13:11  Sample type No range found ARTERIAL ARTERIAL DRAW ARTERIAL ARTERIAL ARTERIAL  Delivery systems No range found  VENTILATOR     FIO2 No range found  0.70     Mode No range found  PRESSURE REGULATED VOLUME CONTROL     VT No range found  470     Peep/cpap No range found  5.0     pH, Arterial Latest Range: 7.350-7.450  7.256 (L) 7.194 (LL) 7.188 (LL) 7.318 (L) 7.314 (L)  pCO2 arterial Latest Range: 35.0-45.0 mmHg 71.6 (HH) 85.7 (HH) 85.2 (HH) 60.7 (HH) 57.8 (HH)  pO2, Arterial Latest Range: 80.0-100.0 mmHg 71.0 (L) 139.0 (H) 61.0 (L) 67.0 (L) 80.0  Bicarbonate  Latest Range: 20.0-24.0 mEq/L 32.2 (H) 31.8 (H) 32.6 (H) 31.3 (H) 29.4 (H)  TCO2 Latest Range: 0-100 mmol/L 34 34.4 35 33 31  Acid-Base Excess Latest Range: 0.0-2.0 mmol/L 3.0 (H) 4.1 (H) 2.0 4.0 (H) 2.0  O2 Saturation No range found 91.0 99.5 84.0 91.0 94.0  Patient temperature No range found 97.0 F 98.6 97.9 F 98.0 F 98.7 F  Collection site No range found RADIAL, ALLEN'S TEST ACCEPTABLE BRACHIAL ARTERY ARTERIAL LINE ARTERIAL LINE ARTERIAL LINE  Allens test (pass/fail) Latest Range: PASS   PASS      Results for AMZIE, SILLAS (MRN [id number]) as of 03/12/2012 15:07  Ref. Range 03/01/2012 06:05 03/01/2012 13:13 03/02/2012 05:15 03/03/2012 18:53 03/03/2012 23:01 03/04/2012 17:34  Sample type No range found ARTERIAL ARTERIAL ARTERIAL DRAW ARTERIAL ARTERIAL ARTERIAL  Delivery systems No range found BILEVEL POSITIVE AIRWAY PRESSURE  BILEVEL POSITIVE AIRWAY PRESSURE     FIO2 No range found 0.50  0.50     Mode No range found   BILEVEL POSITIVE AIRWAY PRESSURE     Inspiratory PAP No range found 15  16     Expiratory PAP No range found 5  8     pH, Arterial Latest Range: 7.350-7.450  7.255 (L) 7.189 (LL) 7.218 (L) 7.251 (L) 7.239 (L) 7.256 (L)  pCO2 arterial Latest Range: 35.0-45.0 mmHg 67.3 (HH) 80.8 (HH) 78.1 (HH) 74.7 (HH) 79.7 (HH) 71.6 (HH)  pO2, Arterial Latest Range: 80.0-100.0 mmHg 98.3 92.0 102.0 (H) 84.0 82.0 71.0 (L)  Bicarbonate Latest Range: 20.0-24.0 mEq/L 28.9 (H) 31.1 (H) 30.6 (H) 32.9 (H) 34.3 (H) 32.2 (H)  TCO2 Latest Range: 0-100 mmol/L 30.9 34 33.0 35 37 34  Acid-Base Excess Latest Range: 0.0-2.0 mmol/L 2.4 (H)  3.4 (H) 3.0 (H) 4.0 (H) 3.0 (H)  O2 Saturation No range found 96.3 95.0 96.6 94.0 93.0 91.0  Patient temperature No range found 98.6 97.1 F 98.6  97.9 F 97.0 F  Collection site No range found LEFT RADIAL RADIAL, ALLEN'S TEST ACCEPTABLE LEFT RADIAL RADIAL, ALLEN'S TEST ACCEPTABLE RADIAL, ALLEN'S TEST ACCEPTABLE RADIAL, ALLEN'S TEST ACCEPTABLE  Allens test (pass/fail)  Latest Range: PASS  PASS  PASS      Results for LUDIA, GARTLAND (MRN [id number]) as of 03/12/2012 15:07  Ref. Range 02/29/2012 15:07 03/01/2012 04:50  Sample type No range found ARTERIAL ARTERIAL  Delivery systems No range found  BILEVEL POSITIVE AIRWAY PRESSURE  FIO2 No range found  0.40  Inspiratory PAP No range found  12  Expiratory PAP No range found  6  pH, Arterial Latest Range: 7.350-7.450  7.268 (L) 7.209 (L)  pCO2 arterial Latest Range: 35.0-45.0 mmHg 64.1 (HH) 76.4 (HH)  pO2,  Arterial Latest Range: 80.0-100.0 mmHg 86.0 73.4 (L)  Bicarbonate Latest Range: 20.0-24.0 mEq/L 29.1 (H) 29.4 (H)  TCO2 Latest Range: 0-100 mmol/L 31 31.7  Acid-Base Excess Latest Range: 0.0-2.0 mmol/L  2.1 (H)  O2 Saturation No range found 94.0 89.8  Patient temperature No range found 99.4 F 98.6  Collection site No range found RADIAL, ALLEN'S TEST ACCEPTABLE RIGHT RADIAL  Allens test (pass/fail) Latest Range: PASS   PASS   Results for STEPHANYE, FINNICUM (MRN [id number]) as of 03/12/2012 15:07  Ref. Range 03/05/2012 03:35 03/06/2012 04:55 03/07/2012 03:35 03/08/2012 05:30 03/09/2012 09:10 03/10/2012 04:58 03/11/2012 04:10 03/12/2012 04:40  Sodium Latest Range: 135-145 mEq/L 143 146 (H) 146 (H) 150 (H) 147 (H) 142 144 139  Potassium Latest Range: 3.5-5.1 mEq/L 3.4 (L) 4.2 3.6 3.9 3.3 (L) 4.1 3.4 (L) 4.5  Chloride Latest Range: 96-112 mEq/L 105 109 105 102 100 99 92 (L) 92 (L)  CO2 Latest Range: 19-32 mEq/L 32 29 31 41 (HH) 41 (HH) 40 (HH) >45 (HH) 41 (HH)  BUN Latest Range: 6-23 mg/dL 19 18 23 26  (H) 20 23 31  (H) 29 (H)  Creatinine Latest Range: 0.50-1.10 mg/dL 4.09 8.11 9.14 7.82 9.56 0.73 0.80 0.77  Calcium Latest Range: 8.4-10.5 mg/dL 7.9 (L) 8.1 (L) 8.5 8.7 8.6 8.7 9.0 9.1  GFR calc non Af Amer Latest Range: >90 mL/min >90 >90 >90 >90 >90 >90 80 (L) >90  GFR calc Af Amer Latest Range: >90 mL/min >90 >90 >90 >90 >90 >90 >90 >90  Glucose Latest Range: 70-99 mg/dL 213 (H) 086 (H) 578 (H) 168 (H) 184 (H) 220 (H) 73 273 (H)   Phosphorus Latest Range: 2.3-4.6 mg/dL    3.1 2.9 2.6 3.5 3.6  Magnesium Latest Range: 1.5-2.5 mg/dL    1.8 1.6 2.2 1.8 2.0  Alkaline Phosphatase Latest Range: 39-117 U/L    60    54  Albumin Latest Range: 3.5-5.2 g/dL    2.6 (L)    2.2 (L)  AST Latest Range: 0-37 U/L    37    32  ALT Latest Range: 0-35 U/L    66 (H)    37 (H)  Total Protein Latest Range: 6.0-8.3 g/dL    6.6    6.9  Total Bilirubin Latest Range: 0.3-1.2 mg/dL    0.6    0.4  Pro B Natriuretic peptide (BNP) Latest Range: 0-125 pg/mL   1047.0 (H)        Results for GERTUDE, BENITO (MRN [id number]) as of 03/12/2012 15:07  Ref. Range 02/29/2012 21:01 03/01/2012 06:20 03/02/2012 04:30 03/02/2012 12:22 03/03/2012 04:20 03/03/2012 21:34 03/04/2012 04:10 03/04/2012 08:43  Sodium Latest Range: 135-145 mEq/L  137 137 139 137  137   Potassium Latest Range: 3.5-5.1 mEq/L  4.9 5.8 (H) 3.8 4.0  5.0   Chloride Latest Range: 96-112 mEq/L  99 98 98 98  103   CO2 Latest Range: 19-32 mEq/L  29 33 (H) 34 (H) 30  28   BUN Latest Range: 6-23 mg/dL  34 (H) 27 (H) 23 19  18    Creatinine Latest Range: 0.50-1.10 mg/dL  4.13 2.44 0.10 2.72  5.36   Calcium Latest Range: 8.4-10.5 mg/dL  8.2 (L) 8.2 (L) 8.0 (L) 8.0 (L)  7.6 (L)   GFR calc non Af Amer Latest Range: >90 mL/min  62 (L) 70 (L) 67 (L) >90  >90   GFR calc Af Amer Latest Range: >90 mL/min  72 (L) 81 (L) 78 (L) >90  >90  Glucose Latest Range: 70-99 mg/dL  981 (H) 191 (H) 478 (H) 126 (H)  144 (H)   Phosphorus Latest Range: 2.3-4.6 mg/dL  3.0        Magnesium Latest Range: 1.5-2.5 mg/dL  1.5        Alkaline Phosphatase Latest Range: 39-117 U/L     58  44   Albumin Latest Range: 3.5-5.2 g/dL     3.2 (L)  2.4 (L)   AST Latest Range: 0-37 U/L     228 (H)  122 (H)   ALT Latest Range: 0-35 U/L     202 (H)  135 (H)   Total Protein Latest Range: 6.0-8.3 g/dL     6.8  5.7 (L)   Total Bilirubin Latest Range: 0.3-1.2 mg/dL     0.2 (L)  0.2 (L)   Troponin I Latest Range: <0.30 ng/mL <0.30     <0.30  <0.30 <0.30  Procalcitonin No range found   0.12        Results for SYLVIA, HELMS (MRN [id number]) as of 03/12/2012 15:07  Ref. Range 02/29/2012 11:19 02/29/2012 15:12 02/29/2012 15:13  Sodium Latest Range: 135-145 mEq/L 131 (L)    Potassium Latest Range: 3.5-5.1 mEq/L 4.6    Chloride Latest Range: 96-112 mEq/L 90 (L)    CO2 Latest Range: 19-32 mEq/L 27    Mean Plasma Glucose Latest Range: <117 mg/dL   657 (H)  BUN Latest Range: 6-23 mg/dL 28 (H)    Creatinine Latest Range: 0.50-1.10 mg/dL 8.46    Calcium Latest Range: 8.4-10.5 mg/dL 8.6    GFR calc non Af Amer Latest Range: >90 mL/min 55 (L)    GFR calc Af Amer Latest Range: >90 mL/min 63 (L)    Glucose Latest Range: 70-99 mg/dL 962 (H)    Troponin I Latest Range: <0.30 ng/mL <0.30 <0.30   Pro B Natriuretic peptide (BNP) Latest Range: 0-125 pg/mL 1012.0 (H)    Lactic Acid, Venous Latest Range: 0.5-2.2 mmol/L  2.6 (H)   Procalcitonin No range found  0.33    Results for JOHNICE, RIEBE (MRN [id number]) as of 03/12/2012 15:07  Ref. Range 03/06/2012 04:55 03/07/2012 03:35 03/08/2012 05:30 03/09/2012 09:10 03/11/2012 16:20 03/12/2012 11:52  WBC Latest Range: 4.0-10.5 K/uL 7.6 7.4 8.2 8.2 11.0 (H) 9.7  RBC Latest Range: 3.87-5.11 MIL/uL 4.15 4.45 4.30 4.33 3.92 4.14  Hemoglobin Latest Range: 12.0-15.0 g/dL 40.1 02.7 25.3 66.4 40.3 12.4  HCT Latest Range: 36.0-46.0 % 39.8 42.0 41.8 42.6 39.0 41.0  MCV Latest Range: 78.0-100.0 fL 95.9 94.4 97.2 98.4 99.5 99.0  MCH Latest Range: 26.0-34.0 pg 28.9 30.3 30.2 30.0 30.6 30.0  MCHC Latest Range: 30.0-36.0 g/dL 47.4 25.9 56.3 87.5 64.3 30.2  RDW Latest Range: 11.5-15.5 % 14.2 14.1 14.4 14.4 14.2 13.9  Platelets Latest Range: 150-400 K/uL 179 145 (L) 255 214 244 256  Neutrophils Relative Latest Range: 43-77 %    84 (H)  85 (H)  Lymphocytes Relative Latest Range: 12-46 %    6 (L)  7 (L)  Monocytes Relative Latest Range: 3-12 %    9  6  Eosinophils Relative Latest Range: 0-5 %    1  0  Basophils Relative  Latest Range: 0-1 %    1  2 (H)  NEUT# Latest Range: 1.7-7.7 K/uL    6.9  8.2 (H)  Lymphocytes Absolute Latest Range: 0.7-4.0 K/uL    0.5 (L)  0.7  Monocytes Absolute Latest Range: 0.1-1.0 K/uL    0.7  0.6  Eosinophils Absolute Latest Range: 0.0-0.7 K/uL    0.1  0.0  Basophils Absolute Latest Range: 0.0-0.1 K/uL    0.0  0.2 (H)  RBC Morphology No range found      TARGET CELLS   Results for NASHONDA, LIMBERG (MRN [id number]) as of 03/12/2012 15:07  Ref. Range 02/29/2012 11:19 03/01/2012 06:20 03/02/2012 04:30 03/03/2012 04:20 03/05/2012 03:35  WBC Latest Range: 4.0-10.5 K/uL 7.4 8.7 13.0 (H) 7.6 4.6  RBC Latest Range: 3.87-5.11 MIL/uL 4.55 4.51 4.44 4.76 4.10  Hemoglobin Latest Range: 12.0-15.0 g/dL 40.9 81.1 91.4 78.2 95.6  HCT Latest Range: 36.0-46.0 % 43.0 43.1 44.2 47.5 (H) 39.8  MCV Latest Range: 78.0-100.0 fL 94.5 95.6 99.5 99.8 97.1  MCH Latest Range: 26.0-34.0 pg 29.5 30.6 30.2 30.9 30.2  MCHC Latest Range: 30.0-36.0 g/dL 21.3 08.6 57.8 46.9 62.9  RDW Latest Range: 11.5-15.5 % 14.0 14.2 14.5 14.6 14.2  Platelets Latest Range: 150-400 K/uL 185 185 201 172 174   Results for ALFONSO, SHACKETT (MRN [id number]) as of 03/12/2012 15:07  Ref. Range 03/10/2012 04:58  Prothrombin Time Latest Range: 11.6-15.2 seconds 13.5  INR Latest Range: 0.00-1.49  1.04  aPTT Latest Range: 24-37 seconds 27   Results for BRANTLEE, HINDE (MRN [id number]) as of 03/12/2012 15:07  Ref. Range 03/12/2012 11:52  Vancomycin Tr Latest Range: 10.0-20.0 ug/mL 9.4 (L)  Results for EMMALYNN, PINKHAM (MRN [id number]) as of 03/12/2012 15:07  Ref. Range 02/29/2012 15:12  Cortisol, Plasma No range found 44.9   Results for BRITTYN, SALAZ (MRN [id number]) as of 03/12/2012 15:07  Ref. Range 02/29/2012 15:13  Hemoglobin A1C Latest Range: <5.7 % 6.4 (H)   Results for AMIR, GLAUS (MRN [id number]) as of 03/12/2012 15:07  Ref. Range 02/29/2012 14:49  Influenza A By PCR Latest Range: NEGATIVE  POSITIVE (A)  Influenza B By PCR  Latest Range: NEGATIVE  NEGATIVE  H1N1 flu by pcr Latest Range: NOT DETECTED  DETECTED (A)   Results for JEWELLE, WHITNER (MRN [id number]) as of 03/12/2012 15:07  Ref. Range 03/01/2012 01:15 03/04/2012 01:48 03/08/2012 00:51  Color, Urine Latest Range: YELLOW  YELLOW YELLOW YELLOW  APPearance Latest Range: CLEAR  CLEAR CLEAR CLEAR  Specific Gravity, Urine Latest Range: 1.005-1.030  1.024 1.018 1.025  pH Latest Range: 5.0-8.0  5.5 6.5 6.5  Glucose Latest Range: NEGATIVE mg/dL >6010 (A) 932 (A) NEGATIVE  Bilirubin Urine Latest Range: NEGATIVE  NEGATIVE NEGATIVE NEGATIVE  Ketones, ur Latest Range: NEGATIVE mg/dL NEGATIVE NEGATIVE NEGATIVE  Protein Latest Range: NEGATIVE mg/dL NEGATIVE NEGATIVE NEGATIVE  Urobilinogen, UA Latest Range: 0.0-1.0 mg/dL 0.2 0.2 2.0 (H)  Nitrite Latest Range: NEGATIVE  NEGATIVE NEGATIVE NEGATIVE  Leukocytes, UA Latest Range: NEGATIVE  NEGATIVE NEGATIVE NEGATIVE  Hgb urine dipstick Latest Range: NEGATIVE  NEGATIVE NEGATIVE NEGATIVE  WBC, UA Latest Range: <3 WBC/hpf   0-2  RBC / HPF Latest Range: <3 RBC/hpf   3-6  Squamous Epithelial / LPF Latest Range: RARE  FEW (A)  RARE  Bacteria, UA Latest Range: RARE  RARE  RARE  Legionella Antigen, Urine No range found Negative for Legionella pneumophilia serogroup 1    Strep Pneumo Urinary Antigen Latest Range: NEGATIVE  POSITIVE (A)     u legionella 12/28>>>negative  Urine culture 12/27>>>negative  1/4 Blood culture NTD and pending  1/3 sputum>>>no organisms, non pathogenic oral flora  1/5 sputum>>>no growth, non pathogenic oral flora  1/6 HSV>>>  1/6 BAL>>no org, no growth, pending    Results for orders  placed during the hospital encounter of 02/29/12 (from the past 24 hour(s))  GLUCOSE, CAPILLARY     Status: Abnormal   Collection Time   03/11/12  8:21 PM      Component Value Range   Glucose-Capillary 289 (*) 70 - 99 mg/dL  GLUCOSE, CAPILLARY     Status: Abnormal   Collection Time   03/11/12 11:24 PM      Component  Value Range   Glucose-Capillary 400 (*) 70 - 99 mg/dL   Comment 1 Notify RN    GLUCOSE, CAPILLARY     Status: Abnormal   Collection Time   03/12/12  3:34 AM      Component Value Range   Glucose-Capillary 235 (*) 70 - 99 mg/dL   Comment 1 Notify RN    COMPREHENSIVE METABOLIC PANEL     Status: Abnormal   Collection Time   03/12/12  4:40 AM      Component Value Range   Sodium 139  135 - 145 mEq/L   Potassium 4.5  3.5 - 5.1 mEq/L   Chloride 92 (*) 96 - 112 mEq/L   CO2 41 (*) 19 - 32 mEq/L   Glucose, Bld 273 (*) 70 - 99 mg/dL   BUN 29 (*) 6 - 23 mg/dL   Creatinine, Ser 1.61  0.50 - 1.10 mg/dL   Calcium 9.1  8.4 - 09.6 mg/dL   Total Protein 6.9  6.0 - 8.3 g/dL   Albumin 2.2 (*) 3.5 - 5.2 g/dL   AST 32  0 - 37 U/L   ALT 37 (*) 0 - 35 U/L   Alkaline Phosphatase 54  39 - 117 U/L   Total Bilirubin 0.4  0.3 - 1.2 mg/dL   GFR calc non Af Amer >90  >90 mL/min   GFR calc Af Amer >90  >90 mL/min  MAGNESIUM     Status: Normal   Collection Time   03/12/12  4:40 AM      Component Value Range   Magnesium 2.0  1.5 - 2.5 mg/dL  PHOSPHORUS     Status: Normal   Collection Time   03/12/12  4:40 AM      Component Value Range   Phosphorus 3.6  2.3 - 4.6 mg/dL  GLUCOSE, CAPILLARY     Status: Abnormal   Collection Time   03/12/12  7:13 AM      Component Value Range   Glucose-Capillary 230 (*) 70 - 99 mg/dL  GLUCOSE, CAPILLARY     Status: Abnormal   Collection Time   03/12/12 11:17 AM      Component Value Range   Glucose-Capillary 250 (*) 70 - 99 mg/dL  VANCOMYCIN, TROUGH     Status: Abnormal   Collection Time   03/12/12 11:52 AM      Component Value Range   Vancomycin Tr 9.4 (*) 10.0 - 20.0 ug/mL  CBC WITH DIFFERENTIAL     Status: Abnormal   Collection Time   03/12/12 11:52 AM      Component Value Range   WBC 9.7  4.0 - 10.5 K/uL   RBC 4.14  3.87 - 5.11 MIL/uL   Hemoglobin 12.4  12.0 - 15.0 g/dL   HCT 04.5  40.9 - 81.1 %   MCV 99.0  78.0 - 100.0 fL   MCH 30.0  26.0 - 34.0 pg   MCHC 30.2  30.0 -  36.0 g/dL   RDW 91.4  78.2 - 95.6 %   Platelets 256  150 -  400 K/uL   Neutrophils Relative 85 (*) 43 - 77 %   Lymphocytes Relative 7 (*) 12 - 46 %   Monocytes Relative 6  3 - 12 %   Eosinophils Relative 0  0 - 5 %   Basophils Relative 2 (*) 0 - 1 %   Neutro Abs 8.2 (*) 1.7 - 7.7 K/uL   Lymphs Abs 0.7  0.7 - 4.0 K/uL   Monocytes Absolute 0.6  0.1 - 1.0 K/uL   Eosinophils Absolute 0.0  0.0 - 0.7 K/uL   Basophils Absolute 0.2 (*) 0.0 - 0.1 K/uL   RBC Morphology TARGET CELLS    BLOOD GAS, ARTERIAL     Status: Abnormal   Collection Time   03/12/12  3:12 PM      Component Value Range   FIO2 0.50     Delivery systems VENTILATOR     Mode PRESSURE REGULATED VOLUME CONTROL     VT 500.0     Rate 14.0     Peep/cpap 5.0     pH, Arterial 7.373  7.350 - 7.450   pCO2 arterial 78.1 (*) 35.0 - 45.0 mmHg   pO2, Arterial 72.6 (*) 80.0 - 100.0 mmHg   Bicarbonate 44.7 (*) 20.0 - 24.0 mEq/L   TCO2 47.1  0 - 100 mmol/L   Acid-Base Excess 18.3 (*) 0.0 - 2.0 mmol/L   O2 Saturation 92.6     Patient temperature 97.8     Collection site LEFT RADIAL     Drawn by [id number]     Sample type ARTERIAL DRAW     Allens test (pass/fail) PASS  PASS  GLUCOSE, CAPILLARY     Status: Abnormal   Collection Time   03/12/12  3:32 PM      Component Value Range   Glucose-Capillary 188 (*) 70 - 99 mg/dL    Signed: Annett Gula 03/12/2012, 5:08 PM   Time Spent on Discharge: 45 minutes  Services Ordered on Discharge: PT Equipment Ordered on Discharge: none

## 2012-03-12 NOTE — Progress Notes (Signed)
Patient ID: [name], female   DOB: September 11, 1955, 57 y.o.   MRN: [id number]   Pt was scheduled for perc G tube in IR today. Barium still in stomach Will reschedule for 1/9  See orders for new barium via ng at 8pm tonight                         NPO after MN except meds                         Abd 1 view (port) 1/9 600 am

## 2012-03-12 NOTE — Progress Notes (Signed)
CCM Resident note  Name: [name] MRN: [id number] DOB: 1955-06-09    LOS: 12  REFERRING MD :  Janee Morn   CHIEF COMPLAINT:  AECOPD and hypotension   BRIEF PATIENT DESCRIPTION:  57 yo female smoker admitted on 02/29/2012 with dyspnea, wheeze, cough 2nd to AECOPD with Flu A/H1N1 and S. pneumo Previously followed by Dr. Vassie Loll as outpt.  Significant PMHx of COPD on home oxygen, GERD, Hyperlipidemia, Chronic pain, HTN, CAD, DM  LINES / TUBES: PIV right wrist and anticubital 1/4--->Change peripheral today ETT 12/31>>1/6 Trach (DF) 1/6>> NGT>>1/8 PEG 11/8>>  CULTURES: Influenza pcr 12/27>>>POSITIVE (Flu A/H1N1) u strep 12/28>>>POSITIVE u legionella 12/28>>>negative Urine culture 12/27>>>negative 1/4 BC>>>NTD and pending  1/3 sputum>>>no organisms, non pathogenic oral flora 1/5 sputum>>>no growth, non pathogenic oral flora 1/6 HSV>>> 1/6 BAL>>no org, no growth, pending   ANTIBIOTICS: levaquin 12/27>>>1/4 ceftaz 1/4>>>1/7 tamiflu 12/27>>>1/7 Acyclovir (HSV on bronch?) 1/7>> Primaxin 1/7>> vanc 1/4>>>  SIGNIFICANT EVENTS:  12/27 Admit ICU requiring BiPAP 12/31- ETT 1/6-Trach 1/8-Peg  DVT Px:  Heparin GI Px:  PPI-Protonix   INTERVAL HISTORY:  T max 100.4, remains on precedex  VITAL SIGNS: Temp:  [97.7 F (36.5 C)-100.4 F (38 C)] 99.7 F (37.6 C) (01/08 0400) Pulse Rate:  [50-90] 68  (01/08 0600) Resp:  [16-28] 21  (01/08 0600) BP: (104-165)/(58-120) 130/81 mmHg (01/08 0600) SpO2:  [87 %-98 %] 92 % (01/08 0600) FiO2 (%):  [50 %] 50 % (01/08 0600) Weight:  [241 lb 13.5 oz (109.7 kg)] 241 lb 13.5 oz (109.7 kg) (01/08 0500) HEMODYNAMICS:   VENTILATOR SETTINGS: Vent Mode:  [-] PCV FiO2 (%):  [50 %] 50 % Set Rate:  [14 bmp] 14 bmp PEEP:  [5 cmH20] 5 cmH20 Pressure Support:  [14 cmH20] 14 cmH20 Plateau Pressure:  [27 cmH20] 27 cmH20 INTAKE / OUTPUT: Intake/Output      01/07 0701 - 01/08 0700   I.V. (mL/kg) 1832.4 (16.7)   NG/GT 300   IV Piggyback  511   Total Intake(mL/kg) 2643.4 (24.1)   Urine (mL/kg/hr) 1555 (0.6)   Total Output 1555   Net +1088.4         PHYSICAL EXAMINATION: General: lying in bed, awake, following commands Neuro: follows commands HEENT: trach intact, [place]/at Cardiovascular:  RRR, no murmur Lungs: ctab Abdomen:  Soft, non tender, mildly distended, obese, hypoactive bs Musculoskeletal: no edema, warm lower extremities, no cyanosis  Skin: no rashes or breakdown    LABS: Cbc  Lab 03/11/12 1620 03/09/12 0910 03/08/12 0530  WBC 11.0* -- --  HGB 12.0 13.0 13.0  HCT 39.0 42.6 41.8  PLT 244 214 255    Chemistry   Lab 03/12/12 0440 03/11/12 0410 03/10/12 0458  NA 139 144 142  K 4.5 3.4* 4.1  CL 92* 92* 99  CO2 41* >45* 40*  BUN 29* 31* 23  CREATININE 0.77 0.80 0.73  CALCIUM 9.1 9.0 8.7  MG 2.0 1.8 2.2  PHOS 3.6 3.5 2.6  GLUCOSE 273* 73 220*    Liver fxn  Lab 03/12/12 0440 03/08/12 0530  AST 32 37  ALT 37* 66*  ALKPHOS 54 60  BILITOT 0.4 0.6  PROT 6.9 6.6  ALBUMIN 2.2* 2.6*    BNP  Lab 03/07/12 0335  PROBNP 1047.0*   ABG  Lab 03/11/12 0402 03/10/12 1505 03/06/12 1321  PHART 7.493* 7.415 7.370  PCO2ART 69.9* 68.9* 55.5*  PO2ART 64.0* 70.0* 74.0*  HCO3 53.6* 43.4* 31.9*  TCO2 >50 45.5 34    CBG trend  Lab 03/12/12 0334 03/11/12 2324 03/11/12 2021 03/11/12 1554 03/11/12 1255  GLUCAP 235* 400* 289* 235* 179*    IMAGING: Dg Chest Port 1 View  03/11/2012  *RADIOLOGY REPORT*  Clinical Data: Tracheostomy, respiratory failure  PORTABLE CHEST - 1 VIEW  Comparison: 03/10/2012; 03/09/2012; 03/08/2012  Findings: Grossly unchanged cardiac silhouette and mediastinal contours.  Stable position of support apparatus.  Grossly unchanged possible trace bilateral effusions and bibasilar heterogeneous opacities, left greater than right.  Pulmonary vasculature remains indistinct.  No pneumothorax.  Unchanged bones.  IMPRESSION: 1.  Stable positioning of support apparatus.  No pneumothorax. 2.   Grossly unchanged findings of mild pulmonary edema, with bilateral effusions and bibasilar opacities, left greater than right, atelectasis versus infiltrate.   Original Report Authenticated By: Tacey Ruiz, MD    Chest Portable 1 View To Assess Tube Placement And Rule-out Pneumothorax  03/10/2012  *RADIOLOGY REPORT*  Clinical Data: Tube placement.  Evaluate for pneumothorax.  PORTABLE CHEST - 1 VIEW  Comparison: Multiple recent previous exams.  Findings: 1431 hours.  Endotracheal tube is no longer visible. Tracheostomy tube is new in the interval. The NG tube passes into the stomach although the distal tip position is not included on the film. The cardiopericardial silhouette is enlarged.  There is a left greater than right bibasilar atelectasis.  No evidence for pneumothorax. Telemetry leads overlie the chest.  IMPRESSION: No evidence for pneumothorax status post tracheostomy tube placement.  Left greater than right bibasilar atelectasis or infiltrate.   Original Report Authenticated By: Kennith Center, M.D.      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 Suspect HAP COPD, OSA/OHS - home O2 2 liters Likely ARDS mild pulmonary edema, b/l effusions and bibasilar opacities, left >right atelectasis vs infiltrate  PLAN:   BDers (i.e. Combivent), Solumedrol 40 q12 increased to 60 q12 Continue Vanc, Primaxin, Acyclovir  Lasix 40 iv x 1, 500 cc  Dc PCV, start PRVC Tv 450 rate 14 40% peep 5, assess abg Goal PS 20-22, cpap 5, goal 4 hrs Treat ileus  CARDIOVASCULAR ASSESSMENT:  Hx of HTN-BP stable Hx CAD, Hyperlipidemia. Urine out 24 hours net neg 0.366 L Bradycardia 50s overnight   PLAN:  NS 50 cc/hr to [facility] Lopressor, consider avoid  with bronchospasm? Add hydralazine imdur, ASA Strict i/o, daily weights Lasix 40 iv x 1   RENAL ASSESSMENT:   Electrolytes wnl  PLAN:   Lasix 40 mg iv x 1 Neg 500 cc as goal  GASTROINTESTINAL ASSESSMENT:  Possible ileus noted on KUB-hard small stool with jelly appearance today w/ soap suds enema Nutrition. Elevated LFT's-trending down Bowel regimen  PLAN:   NGT in place hold TF Tube feeds with Opexa, Prostat.  PEG on hold today d/t barium in stomach. Will get PEG 1/9 via IR Repeat KUB in the am PPI-Protonix  Colace, lactulose increased doses, soap suds enema  HEMATOLOGIC ASSESSMENT:  No issues  DVT px   PLAN:  Pending cbc, avoid daily, avoid phlebotmoy Sub q hep  INFECTIOUS ASSESSMENT:   H1N1, Flu A, Pneumococcal Ag positive. T max 100.4 Tracheitis   PLAN:   1/4 blood cultures NTD and pending, f/u BAL studies (HSV pending, BAL quant 1/6 without org, no growth) Continue imipenem, acyclovir empirically pending HSV studies Fever curve better with  above empirics consider dc vanc if now mrsa Consider 7-8 days imi, empiric if fever curve continues to drop  ENDOCRINE ASSESSMENT:   DM type II with hyperglycemia likely 2/2 steroids   PLAN:   SSI, lantus 20 units, Novolog 3 units q 4  NEUROLOGIC ASSESSMENT:   More alert, less agitated. RASS 0. CAM negative   No prn Haldol given, Precedex,  Risperdal 1 mg q12 helping D/c Fentanyl, Versed prn  PLAN:   Goal to dc precedex Increase Risperdal 2 q12h Anxiety an issue now, add ativan 1 mg q8h  Haldol prn   Summary: Resp. Failure with ARDS, pneumonia, H1N1, influenza A, Lasix x 1 to diuresis, empirically treating with Acyclovir pending HSV studies, fever trending down, pending BAL studies, KUB concerning for ileus, 1/9 place peg for nutrition, hyperglycemic added q4 hr 3 units insulin. Avoid hypoglycemia. Transfer to SDU and in future to [facility] if off precedex  Desma Maxim  PGY 1 IM [phone]  Ccm time  30 min  I have fully examined this patient and agree with above findings.    And edited in full  Mcarthur Rossetti. Tyson Alias, MD, FACP Pgr: [phone] [place] [facility]

## 2012-03-13 ENCOUNTER — Institutional Professional Consult (permissible substitution) (HOSPITAL_COMMUNITY): Payer: Medicare Other

## 2012-03-13 ENCOUNTER — Other Ambulatory Visit (HOSPITAL_COMMUNITY): Payer: Medicare Other

## 2012-03-13 LAB — URINALYSIS, ROUTINE W REFLEX MICROSCOPIC
Bilirubin Urine: NEGATIVE
Nitrite: NEGATIVE
Protein, ur: NEGATIVE mg/dL
Specific Gravity, Urine: 1.01 (ref 1.005–1.030)
Urobilinogen, UA: 0.2 mg/dL (ref 0.0–1.0)

## 2012-03-13 LAB — COMPREHENSIVE METABOLIC PANEL
ALT: 48 U/L — ABNORMAL HIGH (ref 0–35)
Alkaline Phosphatase: 56 U/L (ref 39–117)
BUN: 30 mg/dL — ABNORMAL HIGH (ref 6–23)
CO2: 40 mEq/L (ref 19–32)
Chloride: 92 mEq/L — ABNORMAL LOW (ref 96–112)
GFR calc Af Amer: >90 mL/min (ref >90)
GFR calc non Af Amer: 79 mL/min — ABNORMAL LOW (ref >90)
Glucose, Bld: 418 mg/dL — ABNORMAL HIGH (ref 70–99)
Potassium: 4.6 mEq/L (ref 3.5–5.1)
Sodium: 141 mEq/L (ref 135–145)
Total Bilirubin: 0.5 mg/dL (ref 0.3–1.2)

## 2012-03-13 LAB — CULTURE, BAL-QUANTITATIVE W GRAM STAIN: Colony Count: 30000

## 2012-03-13 LAB — HERPES SIMPLEX VIRUS CULTURE
Culture: DETECTED
Special Requests: NORMAL

## 2012-03-13 LAB — PREALBUMIN: Prealbumin: 10.9 mg/dL — ABNORMAL LOW (ref 17.0–34.0)

## 2012-03-13 LAB — CBC
HCT: 41 % (ref 36.0–46.0)
Hemoglobin: 12.2 g/dL (ref 12.0–15.0)
RBC: 4.07 MIL/uL (ref 3.87–5.11)

## 2012-03-13 LAB — URINE MICROSCOPIC-ADD ON

## 2012-03-13 MED ORDER — IOHEXOL 300 MG/ML  SOLN
50.0000 mL | Freq: Once | INTRAMUSCULAR | Status: AC | PRN
  Administered 2012-03-13: 10 mL

## 2012-03-13 MED ORDER — GLUCAGON HCL (RDNA) 1 MG IJ SOLR
INTRAMUSCULAR | Status: AC | PRN
  Administered 2012-03-13: 1 mg via INTRAVENOUS

## 2012-03-13 MED ORDER — HYDROMORPHONE HCL PF 1 MG/ML IJ SOLN: 1.0000 mg | INTRAMUSCULAR | Status: DC | PRN

## 2012-03-13 MED ORDER — FENTANYL CITRATE 0.05 MG/ML IJ SOLN
INTRAMUSCULAR | Status: AC | PRN
  Administered 2012-03-13 (×2): 50 ug via INTRAVENOUS

## 2012-03-13 MED ORDER — MIDAZOLAM HCL 2 MG/2ML IJ SOLN
INTRAMUSCULAR | Status: AC | PRN
  Administered 2012-03-13 (×2): 1 mg via INTRAVENOUS

## 2012-03-13 MED ORDER — ONDANSETRON HCL 4 MG/2ML IJ SOLN: 4.0000 mg | INTRAMUSCULAR | Status: DC | PRN

## 2012-03-13 MED ORDER — HYDROCODONE-ACETAMINOPHEN 5-325 MG PO TABS: 1.0000 | ORAL_TABLET | ORAL | Status: DC | PRN

## 2012-03-13 NOTE — Progress Notes (Signed)
Pt. Very nauseated and I stopped tube feeds and hooked up NGT to suction. I notified MD and received an order for 4mg  Zofran IVP. Pt. Continued to be anxious and HR in 120s and BP lower than normal. MD notified and 1mg  Haldol given for comfort. Will continue to monitor and plan to [facility].

## 2012-03-13 NOTE — ED Notes (Signed)
IV infiltrated.  Restart.

## 2012-03-13 NOTE — Progress Notes (Signed)
Trach care done. Inner cannula changed; trach ties changed

## 2012-03-13 NOTE — Procedures (Signed)
20F gastrostomy tube placed No complication No blood loss. See complete dictation in [facility] PACS.  

## 2012-03-13 NOTE — ED Notes (Signed)
Moved to bed.  Floor nurse here, report given,  Pt tolerated well.

## 2012-03-14 LAB — CBC
HCT: 37.2 % (ref 36.0–46.0)
Hemoglobin: 11 g/dL — ABNORMAL LOW (ref 12.0–15.0)
MCH: 30 pg (ref 26.0–34.0)
MCV: 101.4 fL — ABNORMAL HIGH (ref 78.0–100.0)
RBC: 3.67 MIL/uL — ABNORMAL LOW (ref 3.87–5.11)

## 2012-03-14 LAB — BASIC METABOLIC PANEL
BUN: 25 mg/dL — ABNORMAL HIGH (ref 6–23)
CO2: 43 mEq/L (ref 19–32)
Calcium: 8.8 mg/dL (ref 8.4–10.5)
Creatinine, Ser: 0.71 mg/dL (ref 0.50–1.10)
Glucose, Bld: 417 mg/dL — ABNORMAL HIGH (ref 70–99)

## 2012-03-14 LAB — CULTURE, BLOOD (ROUTINE X 2): Culture: NO GROWTH

## 2012-03-14 LAB — T3 UPTAKE: T3 Uptake Ratio: 47.2 % — ABNORMAL HIGH (ref 22.5–37.0)

## 2012-03-18 ENCOUNTER — Other Ambulatory Visit (HOSPITAL_COMMUNITY): Payer: Medicare Other

## 2012-03-18 DIAGNOSIS — J9600 Acute respiratory failure, unspecified whether with hypoxia or hypercapnia: Secondary | ICD-10-CM

## 2012-03-18 DIAGNOSIS — J189 Pneumonia, unspecified organism: Secondary | ICD-10-CM

## 2012-03-18 DIAGNOSIS — J9589 Other postprocedural complications and disorders of respiratory system, not elsewhere classified: Secondary | ICD-10-CM

## 2012-03-18 DIAGNOSIS — J209 Acute bronchitis, unspecified: Secondary | ICD-10-CM

## 2012-03-18 DIAGNOSIS — J9620 Acute and chronic respiratory failure, unspecified whether with hypoxia or hypercapnia: Secondary | ICD-10-CM

## 2012-03-18 LAB — CBC
HCT: 35.3 % — ABNORMAL LOW (ref 36.0–46.0)
MCHC: 31.4 g/dL (ref 30.0–36.0)
MCV: 95.7 fL (ref 78.0–100.0)
RDW: 13.8 % (ref 11.5–15.5)

## 2012-03-18 LAB — BASIC METABOLIC PANEL
BUN: 24 mg/dL — ABNORMAL HIGH (ref 6–23)
Calcium: 8.7 mg/dL (ref 8.4–10.5)
Creatinine, Ser: 0.6 mg/dL (ref 0.50–1.10)
GFR calc Af Amer: >90 mL/min (ref >90)
GFR calc non Af Amer: >90 mL/min (ref >90)
Potassium: 4.6 mEq/L (ref 3.5–5.1)

## 2012-03-18 NOTE — Consult Note (Signed)
PULMONARY  / CRITICAL CARE MEDICINE  Name: [name] MRN: [id number] DOB: 1955-04-04    LOS: 6  REFERRING PROVIDER:  Hijazi   CHIEF COMPLAINT:  Vent weaning  This is a 57 Year old White female (active smoker on home O2) who was admitted to [facility] on 1/27 w/ H1N1, w/ resultant ARDS, complicated by secondary pneumococcal PNA. She had prolonged course requiring tracheostomy to assist w/ weaning. During trach bronch eval demonstrated severe tracheitis. Because of this antifungals were added and  BAL was obtained.  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 [facility].    LINES / TUBES:   PIV right wrist and anticubital 1/4--->Change peripheral today  ETT 12/31>>1/6  Trach (DF) 1/6>>  NGT>>1/8  PEG 11/8>>   CULTURES:  Influenza pcr 12/27>>>POSITIVE (Flu A/H1N1)  u strep 12/28>>>POSITIVE  u legionella 12/28>>>negative  Urine culture 12/27>>>negative  1/4 BC>>>NTD and pending  1/3 sputum>>>no organisms, non pathogenic oral flora  1/5 sputum>>>no growth, non pathogenic oral flora  1/6 HSV>>> positive * 1/6 BAL>>norm flora    ANTIBIOTICS:  levaquin 12/27>>>1/4  ceftaz 1/4>>>1/7  tamiflu 12/27>>>1/7  Acyclovir (HSV on bronch?) 1/7>>  Primaxin 1/7>>  vanc 1/4>>>   SIGNIFICANT EVENTS:  12/27 Admit ICU requiring BiPAP  12/31- ETT  1/6-Trach  1/8-Peg  1/6: BAL: HSV POSITIVE 1/6: BAL: NOF ANTIBIOTICS:  PAST MEDICAL HISTORY :  Past Medical History  Diagnosis Date  . COPD (chronic obstructive pulmonary disease)   . Respiratory failure   . Morbid obesity   . Tobacco abuse   . GERD (gastroesophageal reflux disease)   . Hyperlipidemia   . Chronic pain   . Hypertension   . Heart attack   . Arthritis   . Allergic rhinitis   . Diabetes mellitus, type 2   . Diabetes mellitus type 2 in obese 02/29/2012  . HTN (hypertension) 02/29/2012   Past Surgical History  Procedure Date  . Cholecystectomy   . Partial  hysterectomy   . Angioplasty   . Umbilical hernia repair 12/2009   Prior to Admission medications   Medication Sig Start Date End Date Taking? Authorizing Provider  acetaminophen (TYLENOL) 160 MG/5ML solution Place 10.2 mLs (325 mg total) into feeding tube every 8 (eight) hours as needed for fever. 03/12/12   Annett Gula, MD  albuterol-ipratropium (COMBIVENT) [phone] MCG/ACT inhaler Inhale 4 puffs into the lungs every 6 (six) hours. 03/12/12   Annett Gula, MD  albuterol-ipratropium (COMBIVENT) [phone] MCG/ACT inhaler Inhale 4 puffs into the lungs every 2 (two) hours as needed for wheezing. 03/12/12   Annett Gula, MD  antiseptic oral rinse (BIOTENE) LIQD 15 mLs by Mouth Rinse route QID. 03/12/12   Annett Gula, MD  aspirin 81 MG chewable tablet Chew 1 tablet (81 mg total) by mouth daily. 03/12/12   Annett Gula, MD  chlorhexidine (PERIDEX) 0.12 % solution Use as directed 15 mLs in the mouth or throat 2 (two) times daily. 03/12/12   Annett Gula, MD  dextrose 5 % SOLN 100 mL with acyclovir 50 MG/ML SOLN 580 mg Inject 580 mg into the vein every 8 (eight) hours. 03/12/12   Annett Gula, MD  docusate (COLACE) 50 MG/5ML liquid Place 5 mLs (50 mg total) into feeding tube daily. 03/12/12   Annett Gula, MD  feeding supplement (PRO-STAT SUGAR FREE 64) LIQD Place 60 mLs into feeding tube 4 (four) times daily. 03/12/12   Annett Gula,  MD  haloperidol lactate (HALDOL) 5 MG/ML injection Inject 0.2 mLs (1 mg total) into the vein every 4 (four) hours as needed. 03/12/12   Annett Gula, MD  heparin 5000 UNIT/ML injection Inject 1 mL (5,000 Units total) into the skin every 8 (eight) hours. 03/12/12   Annett Gula, MD  hydrALAZINE (APRESOLINE) 20 MG/ML injection Inject 0.5 mLs (10 mg total) into the vein every 4 (four) hours as needed (SBP>150). 03/12/12   Annett Gula, MD  hydrALAZINE (APRESOLINE) 50 MG tablet Place 1 tablet (50 mg total) into feeding tube every 8 (eight) hours. 03/12/12   Annett Gula, MD    insulin aspart (NOVOLOG) 100 UNIT/ML injection Inject 3 Units into the skin every 4 (four) hours. 03/12/12   Annett Gula, MD  insulin aspart (NOVOLOG) 100 UNIT/ML injection Inject 0-20 Units into the skin every 4 (four) hours. 03/12/12   Annett Gula, MD  insulin glargine (LANTUS) 100 UNIT/ML injection Inject 20 Units into the skin at bedtime. 03/12/12   Annett Gula, MD  isosorbide mononitrate (IMDUR) 30 MG 24 hr tablet Take 1 tablet (30 mg total) by mouth daily. 03/12/12   Annett Gula, MD  lactulose (CHRONULAC) 10 GM/15ML solution Place 30 mLs (20 g total) into feeding tube daily. 03/12/12   Annett Gula, MD  LORazepam (ATIVAN) 1 MG tablet Place 1 tablet (1 mg total) into feeding tube every 8 (eight) hours. 03/12/12   Annett Gula, MD  methylPREDNISolone sodium succinate (SOLU-MEDROL) 125 mg/2 mL injection Inject 0.96 mLs (60 mg total) into the vein every 12 (twelve) hours. 03/12/12   Annett Gula, MD  Multiple Vitamin (MULTIVITAMIN) LIQD Place 5 mLs into feeding tube daily. 03/12/12   Annett Gula, MD  Nutritional Supplements (FEEDING SUPPLEMENT, OXEPA,) LIQD Place 1,000 mLs into feeding tube daily. 03/12/12   Annett Gula, MD  pantoprazole sodium (PROTONIX) 40 mg/20 mL PACK Place 20 mLs (40 mg total) into feeding tube daily. 03/12/12   Annett Gula, MD  potassium chloride 20 MEQ/15ML (10%) solution Take 30 mLs (40 mEq total) by mouth daily. 03/12/12   Annett Gula, MD  risperiDONE (RISPERDAL) 1 MG/ML oral solution Place 2 mLs (2 mg total) into feeding tube every 12 (twelve) hours. 03/12/12   Annett Gula, MD  sodium chloride 0.45 % solution Inject 10-20 mLs into the vein continuous. 03/12/12   Annett Gula, MD  sodium chloride 0.9 % SOLN 100 mL with imipenem-cilastatin 500 MG SOLR 500 mg Inject 500 mg into the vein every 6 (six) hours. 03/12/12   Annett Gula, MD  Water For Irrigation, Sterile (FREE WATER) SOLN Place 200 mLs into feeding tube every 8 (eight) hours. 03/12/12   Annett Gula, MD    No Known Allergies  FAMILY HISTORY:  No family history on file. SOCIAL HISTORY:  reports that she has quit smoking. Her smoking use included Cigarettes. She has a 40 pack-year smoking history. She has never used smokeless tobacco. She reports that she does not drink alcohol. Her drug history not on file.  REVIEW OF SYSTEMS:   Unable   INTERVAL HISTORY:  Weaning on PSV.   VITAL SIGNS:   Reviewed and unremarkable.   PHYSICAL EXAMINATION: General:  Chronically ill appearing white female.  Neuro:  Awake and alert. Following commands.  HEENT:  Trach unremarkable.  Cardiovascular:  rrr Lungs: prolonged wheeze  Abdomen:  Soft, non-tender PEG intact  Musculoskeletal:  Intact  Skin:  Intact    Lab 03/18/12 0515 03/14/12 0527 03/13/12 0520  NA 136 143 141  K 4.6 4.9 4.6  CL 96 97 92*  CO2 34* 43* 40*  BUN 24* 25* 30*  CREATININE 0.60 0.71 0.81  GLUCOSE 268* 417* 418*    Lab 03/18/12 0515 03/14/12 0527 03/13/12 0520  HGB 11.1* 11.0* 12.2  HCT 35.3* 37.2 41.0  WBC 7.2 8.7 10.2  PLT 263 289 286   Dg Chest Port 1 View  03/18/2012  *RADIOLOGY REPORT*  Clinical Data: Shortness of breath, history pneumonia  PORTABLE CHEST - 1 VIEW  Comparison: Portable chest x-ray of 03/13/2012  Findings: There is bibasilar atelectasis remaining.  No definite pneumonia is seen.  Cardiomegaly is stable.  Again minimal pulmonary vascular congestion cannot be excluded. A tracheostomy is again noted.  The NG tube has been removed.  IMPRESSION: Bibasilar linear atelectasis.  Probable minimal pulmonary vascular congestion.   Original Report Authenticated By: Dwyane Dee, M.D.     ASSESSMENT / PLAN: 1) Acute on Chronic respiratory failure due to H1N1 PNA w/ secondary pneumococcal super-infection, AECOPD and c/b ALI. CXR continues to improve. Now w/ resolving RLL airspace disease.Oxygen and vent requirements improved.   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 Now tolerating PSV well s/p trach. Suspect that she will be liberated from vent in near future.   3) Presumed severe COPD: s/p acute exacerbation.   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   6) H/o CAD and HTN: stable   7) DM type II w/ hyperglycemia: per # svc   8) deconditioning.   9) underlying OSA ?   She is looking good from pulm standpoint. Tolerating PSV well. There is a concern about underlying OSA which has not been studied. This may be a consideration when it comes time to consider decannulation but will depend  on her strength and ability to tolerate CPAP.   Recommendation Cont vent wean, to goal PS 8 Wean steroids to off Complete 8 d total empiric HCAP coverage Complete 14 d acyclovir Cont BDs Wean FIO2 Focus on rehab efforts.  pcxr improved over time , over all  Pulmonary and Critical Care Medicine [facility] Pager: [phone]  03/18/2012, 11:29 AM  Mcarthur Rossetti. Tyson Alias, MD, FACP Pgr: [phone] [place] [facility]

## 2012-03-19 ENCOUNTER — Other Ambulatory Visit (HOSPITAL_COMMUNITY): Payer: Medicare Other

## 2012-03-20 ENCOUNTER — Other Ambulatory Visit (HOSPITAL_COMMUNITY): Payer: Medicare Other

## 2012-03-21 LAB — CBC
MCH: 30.1 pg (ref 26.0–34.0)
MCHC: 31.8 g/dL (ref 30.0–36.0)
Platelets: 229 10*3/uL (ref 150–400)
RDW: 14.1 % (ref 11.5–15.5)

## 2012-03-21 LAB — CLOSTRIDIUM DIFFICILE BY PCR: Toxigenic C. Difficile by PCR: POSITIVE — AB

## 2012-03-21 LAB — BASIC METABOLIC PANEL
Calcium: 8.8 mg/dL (ref 8.4–10.5)
GFR calc non Af Amer: >90 mL/min (ref >90)
Sodium: 130 mEq/L — ABNORMAL LOW (ref 135–145)

## 2012-03-21 NOTE — Progress Notes (Signed)
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 She had prolonged course requiring tracheostomy to assist w/ weaning. During trach bronch eval demonstrated severe tracheitis. Because of this antifungals were added and  BAL was obtained.  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 [facility].    LINES / TUBES:   PIV right wrist and anticubital 1/4--->Change peripheral today  ETT 12/31>>1/6  Trach (DF) 1/6>>  NGT>>1/8  PEG 11/8>>   CULTURES:  Influenza pcr 12/27>>>POSITIVE (Flu A/H1N1)  u strep 12/28>>>POSITIVE  u legionella 12/28>>>negative  Urine culture 12/27>>>negative  1/4 BC>>>NTD and pending  1/3 sputum>>>no organisms, non pathogenic oral flora  1/5 sputum>>>no growth, non pathogenic oral flora  1/6 HSV>>> positive * 1/6 BAL>>norm flora    ANTIBIOTICS:  levaquin 12/27>>>1/4  ceftaz 1/4>>>1/7  tamiflu 12/27>>>1/7  Acyclovir (HSV on bronch?) 1/7>>  Primaxin 1/7>>  vanc 1/4>>>   SIGNIFICANT EVENTS:  12/27 Admit ICU requiring BiPAP  12/31- ETT  1/6-Trach  1/8-Peg  1/6: BAL: HSV POSITIVE 1/6: BAL: NOF ANTIBIOTICS:  PAST MEDICAL HISTORY :  Past Medical History  Diagnosis Date  . COPD (chronic obstructive pulmonary disease)   . Respiratory failure   . Morbid obesity   . Tobacco abuse   . GERD (gastroesophageal reflux disease)   . Hyperlipidemia   . Chronic pain   . Hypertension   . Heart attack   . Arthritis   . Allergic rhinitis   . Diabetes mellitus, type 2   . Diabetes mellitus type 2 in obese 02/29/2012  . HTN (hypertension) 02/29/2012   Past Surgical History  Procedure Date  . Cholecystectomy   . Partial  hysterectomy   . Angioplasty   . Umbilical hernia repair 12/2009   Prior to Admission medications   Medication Sig Start Date End Date Taking? Authorizing Provider  acetaminophen (TYLENOL) 160 MG/5ML solution Place 10.2 mLs (325 mg total) into feeding tube every 8 (eight) hours as needed for fever. 03/12/12   Annett Gula, MD  albuterol-ipratropium (COMBIVENT) [phone] MCG/ACT inhaler Inhale 4 puffs into the lungs every 6 (six) hours. 03/12/12   Annett Gula, MD  albuterol-ipratropium (COMBIVENT) [phone] MCG/ACT inhaler Inhale 4 puffs into the lungs every 2 (two) hours as needed for wheezing. 03/12/12   Annett Gula, MD  antiseptic oral rinse (BIOTENE) LIQD 15 mLs by Mouth Rinse route QID. 03/12/12   Annett Gula, MD  aspirin 81 MG chewable tablet Chew 1 tablet (81 mg total) by mouth daily. 03/12/12   Annett Gula, MD  chlorhexidine (PERIDEX) 0.12 % solution Use as directed 15 mLs in the mouth or throat 2 (two) times daily. 03/12/12   Annett Gula, MD  dextrose 5 % SOLN 100 mL with acyclovir 50 MG/ML SOLN 580 mg Inject 580 mg into the vein every 8 (eight) hours. 03/12/12   Annett Gula, MD  docusate (COLACE) 50 MG/5ML liquid Place 5 mLs (50 mg total) into feeding tube daily. 03/12/12   Annett Gula, MD  feeding supplement (PRO-STAT SUGAR FREE 64) LIQD Place 60 mLs into feeding tube 4 (four) times daily. 03/12/12   Annett Gula,  MD  haloperidol lactate (HALDOL) 5 MG/ML injection Inject 0.2 mLs (1 mg total) into the vein every 4 (four) hours as needed. 03/12/12   Annett Gula, MD  heparin 5000 UNIT/ML injection Inject 1 mL (5,000 Units total) into the skin every 8 (eight) hours. 03/12/12   Annett Gula, MD  hydrALAZINE (APRESOLINE) 20 MG/ML injection Inject 0.5 mLs (10 mg total) into the vein every 4 (four) hours as needed (SBP>150). 03/12/12   Annett Gula, MD  hydrALAZINE (APRESOLINE) 50 MG tablet Place 1 tablet (50 mg total) into feeding tube every 8 (eight) hours. 03/12/12   Annett Gula, MD    insulin aspart (NOVOLOG) 100 UNIT/ML injection Inject 3 Units into the skin every 4 (four) hours. 03/12/12   Annett Gula, MD  insulin aspart (NOVOLOG) 100 UNIT/ML injection Inject 0-20 Units into the skin every 4 (four) hours. 03/12/12   Annett Gula, MD  insulin glargine (LANTUS) 100 UNIT/ML injection Inject 20 Units into the skin at bedtime. 03/12/12   Annett Gula, MD  isosorbide mononitrate (IMDUR) 30 MG 24 hr tablet Take 1 tablet (30 mg total) by mouth daily. 03/12/12   Annett Gula, MD  lactulose (CHRONULAC) 10 GM/15ML solution Place 30 mLs (20 g total) into feeding tube daily. 03/12/12   Annett Gula, MD  LORazepam (ATIVAN) 1 MG tablet Place 1 tablet (1 mg total) into feeding tube every 8 (eight) hours. 03/12/12   Annett Gula, MD  methylPREDNISolone sodium succinate (SOLU-MEDROL) 125 mg/2 mL injection Inject 0.96 mLs (60 mg total) into the vein every 12 (twelve) hours. 03/12/12   Annett Gula, MD  Multiple Vitamin (MULTIVITAMIN) LIQD Place 5 mLs into feeding tube daily. 03/12/12   Annett Gula, MD  Nutritional Supplements (FEEDING SUPPLEMENT, OXEPA,) LIQD Place 1,000 mLs into feeding tube daily. 03/12/12   Annett Gula, MD  pantoprazole sodium (PROTONIX) 40 mg/20 mL PACK Place 20 mLs (40 mg total) into feeding tube daily. 03/12/12   Annett Gula, MD  potassium chloride 20 MEQ/15ML (10%) solution Take 30 mLs (40 mEq total) by mouth daily. 03/12/12   Annett Gula, MD  risperiDONE (RISPERDAL) 1 MG/ML oral solution Place 2 mLs (2 mg total) into feeding tube every 12 (twelve) hours. 03/12/12   Annett Gula, MD  sodium chloride 0.45 % solution Inject 10-20 mLs into the vein continuous. 03/12/12   Annett Gula, MD  sodium chloride 0.9 % SOLN 100 mL with imipenem-cilastatin 500 MG SOLR 500 mg Inject 500 mg into the vein every 6 (six) hours. 03/12/12   Annett Gula, MD  Water For Irrigation, Sterile (FREE WATER) SOLN Place 200 mLs into feeding tube every 8 (eight) hours. 03/12/12   Annett Gula, MD    No Known Allergies  FAMILY HISTORY:  No family history on file. SOCIAL HISTORY:  reports that she has quit smoking. Her smoking use included Cigarettes. She has a 40 pack-year smoking history. She has never used smokeless tobacco. She reports that she does not drink alcohol. Her drug history not on file.  REVIEW OF SYSTEMS:   Unable   INTERVAL HISTORY:  Weaning on PSV.   VITAL SIGNS:   Reviewed and unremarkable.   PHYSICAL EXAMINATION: General:  Chronically ill appearing white female.  Neuro:  Awake and alert. Following commands.  HEENT:  Trach unremarkable.  Cardiovascular:  rrr Lungs: clear  Abdomen:  Soft, non-tender PEG intact  Musculoskeletal:  Intact  Skin:  Intact    Lab 03/21/12 0605 03/18/12 0515  NA 130* 136  K 4.2 4.6  CL 91* 96  CO2 32 34*  BUN 23 24*  CREATININE 0.57 0.60  GLUCOSE 312* 268*    Lab 03/21/12 0605 03/18/12 0515  HGB 11.5* 11.1*  HCT 36.2 35.3*  WBC 8.9 7.2  PLT 229 263   Dg Abd Portable 1v  03/20/2012  *RADIOLOGY REPORT*  Clinical Data: Ileus, abdominal pain  PORTABLE ABDOMEN - 1 VIEW  Comparison: Prior abdominal radiograph 03/19/2012  Findings: Flange retention pull-through gastrostomy tube projects over the gastric bubble.  Limited evaluation of the abdomen demonstrates gas within loops of nondilated large and small bowel. Expected distal progression of barium within the colon.  There is a trace amount of barium remaining.  No massive free air on this single supine view.  Surgical clips the right upper quadrant from prior cholecystectomy.  Linear scar versus atelectasis in the left lung base.  IMPRESSION:  The visualized bowel gas pattern is nonobstructed.  Expected distal progression of barium, only a trace amount of barium remains within the visualized colon.   Original Report Authenticated By: Malachy Moan, M.D.     ASSESSMENT / PLAN: 1) Acute on Chronic respiratory failure due to H1N1 PNA w/ secondary pneumococcal  super-infection, AECOPD and c/b ALI. CXR continues to improve. Now w/ resolving RLL airspace disease.Oxygen and vent requirements improved.   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 Now tolerating ATC w/ PMV. 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   6) H/o CAD and HTN: stable   7) DM type II w/ hyperglycemia: per # svc   8) deconditioning.   9) underlying OSA ?   10 C diff  She is looking good from pulm standpoint. Tolerating weaning and ATC trial well. There is a concern about underlying OSA which has not been studied. This may be a consideration when it comes time to consider decannulation but will depend on her strength and ability to tolerate CPAP.   Recommendation Cont vent wean/ ATC trial  Wean steroids to off Complete 8 d total empiric HCAP coverage Complete 14 d acyclovir Cont BDs Wean FIO2 Focus on rehab efforts.    Mcarthur Rossetti. Tyson Alias, MD, FACP Pgr: [phone] [facility]

## 2012-03-22 ENCOUNTER — Institutional Professional Consult (permissible substitution) (HOSPITAL_COMMUNITY): Payer: Medicare Other

## 2012-03-22 LAB — BASIC METABOLIC PANEL
Calcium: 8.8 mg/dL (ref 8.4–10.5)
GFR calc non Af Amer: >90 mL/min (ref >90)
Glucose, Bld: 161 mg/dL — ABNORMAL HIGH (ref 70–99)
Sodium: 131 mEq/L — ABNORMAL LOW (ref 135–145)

## 2012-03-23 LAB — CBC
MCH: 30.7 pg (ref 26.0–34.0)
MCHC: 32.1 g/dL (ref 30.0–36.0)
Platelets: 218 10*3/uL (ref 150–400)

## 2012-03-23 LAB — BASIC METABOLIC PANEL
BUN: 14 mg/dL (ref 6–23)
Calcium: 9.3 mg/dL (ref 8.4–10.5)
GFR calc non Af Amer: >90 mL/min (ref >90)
Glucose, Bld: 163 mg/dL — ABNORMAL HIGH (ref 70–99)
Sodium: 134 mEq/L — ABNORMAL LOW (ref 135–145)

## 2012-03-23 LAB — ALBUMIN: Albumin: 2.3 g/dL — ABNORMAL LOW (ref 3.5–5.2)

## 2012-03-24 ENCOUNTER — Other Ambulatory Visit (HOSPITAL_COMMUNITY): Payer: Medicare Other

## 2012-03-24 LAB — BASIC METABOLIC PANEL
BUN: 17 mg/dL (ref 6–23)
Calcium: 9 mg/dL (ref 8.4–10.5)
GFR calc non Af Amer: >90 mL/min (ref >90)
Glucose, Bld: 288 mg/dL — ABNORMAL HIGH (ref 70–99)

## 2012-03-24 LAB — CBC
MCH: 30.7 pg (ref 26.0–34.0)
MCHC: 32.2 g/dL (ref 30.0–36.0)
Platelets: 203 10*3/uL (ref 150–400)

## 2012-03-24 MED ORDER — IOHEXOL 350 MG/ML SOLN: 100.0000 mL | Freq: Once | INTRAVENOUS | Status: AC | PRN

## 2012-03-24 NOTE — Progress Notes (Signed)
PULMONARY  / CRITICAL CARE MEDICINE  Name: [name] MRN: [id number] DOB: June 15, 1955    LOS: 12  REFERRING PROVIDER:  Hijazi   CHIEF COMPLAINT:  Vent weaning  This is a 57 Year old White female (active smoker on home O2) who was admitted [facility] 1/27 w/ H1N1, w/ resultant ARDS, complicated by secondary pneumococcal PNA. She had prolonged course requiring tracheostomy to assist w/ weaning. During trach bronch eval demonstrated severe tracheitis. Because of this antifungals were added and  BAL was obtained.  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 [facility].    LINES / TUBES:   PIV right wrist and anticubital 1/4--->Change peripheral today  ETT 12/31>>1/6  Trach (DF) 1/6>>  NGT>>1/8  PEG 11/8>>   CULTURES:  Influenza pcr 12/27>>>POSITIVE (Flu A/H1N1)  u strep 12/28>>>POSITIVE  u legionella 12/28>>>negative  Urine culture 12/27>>>negative  1/4 BC>>>NTD and pending  1/3 sputum>>>no organisms, non pathogenic oral flora  1/5 sputum>>>no growth, non pathogenic oral flora  1/6 HSV>>> positive * 1/6 BAL>>norm flora    ANTIBIOTICS:  levaquin 12/27>>>1/4  ceftaz 1/4>>>1/7  tamiflu 12/27>>>1/7  Acyclovir (HSV on bronch?) 1/7>>  Primaxin 1/7>> OFF vanc 1/4>>> OFF Flagyl (Cdiff)  SIGNIFICANT EVENTS:  12/27 Admit ICU requiring BiPAP  12/31- ETT  1/6-Trach  1/8-Peg     INTERVAL HISTORY:  Weaning on PSV.   VITAL SIGNS:  Reviewed   Reviewed and unremarkable.   PHYSICAL EXAMINATION: General:  Chronically ill appearing white female.  On ATC Neuro:  Awake and alert. Following commands.  HEENT:  Trach unremarkable.  Cardiovascular:  rrr Lungs: clear  Abdomen:  Soft, non-tender PEG intact  Musculoskeletal:  Intact  Skin:  Intact    Lab 03/24/12 0640 03/23/12 0612 03/22/12 0612  NA 133* 134* 131*  K 3.6 3.3* 5.2*  CL 90* 92* 90*  CO2 34* 34* 29  BUN 17 14 21   CREATININE 0.56 0.57 0.49*  GLUCOSE 288*  163* 161*    Lab 03/24/12 0640 03/23/12 0612 03/21/12 0605  HGB 11.1* 11.5* 11.5*  HCT 34.5* 35.8* 36.2  WBC 8.4 10.7* 8.9  PLT 203 218 229   Ct Angio Chest Pe W/cm &/or Wo Cm  03/24/2012  *RADIOLOGY REPORT*  Clinical Data: Shortness of breath; recent ARDS.  Severe tracheitis.  CT ANGIOGRAPHY CHEST  Technique:  Multidetector CT imaging of the chest using the standard protocol during bolus administration of intravenous contrast. Multiplanar reconstructed images including MIPs were obtained and reviewed to evaluate the vascular anatomy.  A total of three boluses of contrast were administered (50 mL each), the first of which infiltrated into the patient's left antecubital fossa.  The site of infiltration was assessed by the radiologist; no associated symptoms were present, and the site remained soft, without evidence for induration or erythema.  The second scan attempt was suboptimal due to limitations in the timing of the contrast bolus.  Contrast:  100 mL of Omnipaque 300 IV contrast  Comparison: Chest radiograph performed 03/23/2011  Findings: There is no evidence of central pulmonary embolus. Evaluation for pulmonary embolus is significantly suboptimal due to motion artifact.  Scattered bibasilar airspace opacities likely reflect atelectasis. There is a mosaic pattern of parenchymal attenuation bilaterally. There may be mild interstitial prominence, though this is difficult to assess due to motion artifact.  There is no evidence of pleural effusion or pneumothorax.  No masses are identified; no abnormal focal contrast enhancement is seen.  The patient's  tracheostomy tube is seen ending 5 cm above the carina.  Note is made of significant flattening of the distal trachea and mainstem bronchi bilaterally; this may be related to the described tracheitis, or could reflect mild tracheobronchomalacia.  Scattered right hilar and peribronchial nodes remain borderline normal in size.  No mediastinal lymphadenopathy  is seen.  No pericardial effusion is identified.  The proximal great vessels are grossly unremarkable in appearance.  No axillary lymphadenopathy is seen.  A small 1.0 cm hypodensity is noted within the right thyroid lobe.  The thyroid gland is otherwise grossly unremarkable.  Scattered calcified granulomata are noted within the liver.  The visualized portions of the spleen are unremarkable.  No acute osseous abnormalities are seen.  IMPRESSION:  1.  No evidence of central pulmonary embolus. 2.  Scattered bibasilar airspace opacities likely reflect atelectasis; mosaic pattern of parenchymal attenuation noted bilaterally.  There may be mild interstitial prominence, though this is difficult to assess due to motion artifact. 3.  Flattening of the distal trachea and mainstem bronchi bilaterally; this may be related to the described tracheitis, or could reflect mild tracheobronchomalacia. 4.  Small 1.0 cm hypodensity within the right thyroid lobe. Consider further evaluation with thyroid ultrasound.  If patient is clinically hyperthyroid, consider nuclear medicine thyroid uptake and scan.   Original Report Authenticated By: Tonia Ghent, M.D.     ASSESSMENT / PLAN: 1) Acute on Chronic respiratory failure due to H1N1 PNA w/ secondary pneumococcal super-infection, AECOPD and c/b ALI. CXR continues to improve. Now w/ resolving RLL airspace disease.Oxygen and vent requirements improved.  Now on ATC.  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 Now tolerating ATC w/ PMV. 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 Now off all abx  6) H/o CAD and HTN: stable   7) DM type II w/ hyperglycemia: per # svc   8) deconditioning.   9) underlying OSA ?   10 C diff  She is looking good from pulm standpoint. Tolerating weaning  and ATC trial well. There is a concern about underlying OSA which has not been studied. This may be a consideration when it comes time to consider decannulation but will depend on her strength and ability to tolerate CPAP.   Recommendation Cont vent wean/ ATC trial  Wean steroids to off Complete 8 d total empiric HCAP coverage Complete 14 d acyclovir Cont BDs Wean FIO2 Focus on rehab efforts.   Dorcas Carrow Beeper  [phone]  Cell  [phone]  If no response or cell goes to voicemail, call beeper [phone]

## 2012-03-25 LAB — TSH: TSH: 2.382 u[IU]/mL (ref 0.350–4.500)

## 2012-03-25 NOTE — Discharge Summary (Signed)
Awaited HSV, positive, treating now empiric PS wenaing  Mcarthur Rossetti. Tyson Alias, MD, FACP Pgr: [phone] [facility]

## 2012-03-26 ENCOUNTER — Institutional Professional Consult (permissible substitution) (HOSPITAL_COMMUNITY): Payer: Medicare Other

## 2012-03-26 LAB — BLOOD GAS, ARTERIAL
Acid-Base Excess: 8.6 mmol/L — ABNORMAL HIGH (ref 0.0–2.0)
Drawn by: 25700
FIO2: 0.4 %
O2 Saturation: 93 %
TCO2: 35.1 mmol/L (ref 0–100)
pO2, Arterial: 73.1 mmHg — ABNORMAL LOW (ref 80.0–100.0)

## 2012-03-26 LAB — CBC
MCH: 30.4 pg (ref 26.0–34.0)
MCV: 95.3 fL (ref 78.0–100.0)
Platelets: 226 10*3/uL (ref 150–400)
RBC: 3.65 MIL/uL — ABNORMAL LOW (ref 3.87–5.11)
RDW: 14.6 % (ref 11.5–15.5)

## 2012-03-26 LAB — BASIC METABOLIC PANEL
BUN: 14 mg/dL (ref 6–23)
CO2: 34 mEq/L — ABNORMAL HIGH (ref 19–32)
Calcium: 9.1 mg/dL (ref 8.4–10.5)
Creatinine, Ser: 0.64 mg/dL (ref 0.50–1.10)

## 2012-03-28 ENCOUNTER — Other Ambulatory Visit (HOSPITAL_COMMUNITY): Payer: Medicare Other

## 2012-03-28 LAB — BASIC METABOLIC PANEL
BUN: 15 mg/dL (ref 6–23)
CO2: 34 mEq/L — ABNORMAL HIGH (ref 19–32)
Calcium: 8.7 mg/dL (ref 8.4–10.5)
Chloride: 92 mEq/L — ABNORMAL LOW (ref 96–112)
Creatinine, Ser: 0.6 mg/dL (ref 0.50–1.10)

## 2012-03-28 LAB — CBC
HCT: 36.8 % (ref 36.0–46.0)
MCH: 30.1 pg (ref 26.0–34.0)
MCV: 95.3 fL (ref 78.0–100.0)
Platelets: 248 10*3/uL (ref 150–400)
RBC: 3.86 MIL/uL — ABNORMAL LOW (ref 3.87–5.11)
WBC: 6.8 10*3/uL (ref 4.0–10.5)

## 2012-03-28 NOTE — Progress Notes (Signed)
PULMONARY  / CRITICAL CARE MEDICINE  Name: [name] MRN: [id number] DOB: [birth year]    LOS: 16  REFERRING PROVIDER:  Hijazi   CHIEF COMPLAINT:  Vent weaning  This is a 57 Year old White female (active smoker on home O2) who was admitted [facility] 1/27 w/ H1N1, w/ resultant ARDS, complicated by secondary pneumococcal PNA. She had prolonged course requiring tracheostomy to assist w/ weaning. During trach bronch eval demonstrated severe tracheitis. Because of this antifungals were added and  BAL was obtained.  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 [facility].    LINES / TUBES:  ETT 12/31>>1/6  Trach (DF) 1/6>>  NGT>>1/8  PEG 11/8>>   CULTURES:  Influenza pcr 12/27>>>POSITIVE (Flu A/H1N1)  u strep 12/28>>>POSITIVE  u legionella 12/28>>>negative  Urine culture 12/27>>>negative  1/4 BC>>>NTD and pending  1/3 sputum>>>no organisms, non pathogenic oral flora  1/5 sputum>>>no growth, non pathogenic oral flora  1/6 HSV>>> positive * 1/6 BAL>>norm flora    ANTIBIOTICS:  levaquin 12/27>>>1/4  ceftaz 1/4>>>1/7  tamiflu 12/27>>>1/7  Acyclovir (HSV on bronch?) 1/7>>  Primaxin 1/7>> OFF vanc 1/4>>> OFF Flagyl (Cdiff)  SIGNIFICANT EVENTS:  12/27 Admit ICU requiring BiPAP  12/31- ETT  1/6-Trach  1/8-Peg 1/24 - tolerated PMV, ATC 24/7    INTERVAL HISTORY:  Weaning on PSV, up to chair, no distress.  VITAL SIGNS:  Reviewed and unremarkable.   PHYSICAL EXAMINATION: General:  Chronically ill appearing white female.  On ATC Neuro:  Awake and alert. Following commands.  HEENT:  Trach c/d/i, PMV in place, voice clear Cardiovascular:  rrr Lungs: clear  Abdomen:  Soft, non-tender PEG intact  Musculoskeletal:  Intact  Skin:  Intact    Lab 03/28/12 0558 03/26/12 0535 03/24/12 0640  NA 132* 136 133*  K 3.8 4.2 3.6  CL 92* 95* 90*  CO2 34* 34* 34*  BUN 15 14 17   CREATININE 0.60 0.64 0.56  GLUCOSE 212* 309* 288*      Lab 03/28/12 0558 03/26/12 0535 03/24/12 0640  HGB 11.6* 11.1* 11.1*  HCT 36.8 34.8* 34.5*  WBC 6.8 5.7 8.4  PLT 248 226 203   Dg Chest Port 1 View  03/28/2012  *RADIOLOGY REPORT*  Clinical Data: Respiratory failure.  COPD.  PORTABLE CHEST - 1 VIEW  Comparison: 03/26/2012  Findings: Tracheostomy appropriately positioned. Normal heart size. No pleural effusion or pneumothorax.  Slight improvement in bibasilar atelectasis/volume loss.  IMPRESSION: Improved bibasilar aeration with decreased atelectasis/volume loss.   Original Report Authenticated By: Jeronimo Greaves, M.D.     ASSESSMENT / PLAN: 1) Acute on Chronic respiratory failure due to H1N1 PNA w/ secondary pneumococcal super-infection, AECOPD and c/b ALI. CXR continues to improve. Now w/ resolving RLL airspace disease.Oxygen and vent requirements improved.  Now on ATC.  CTA of Chest was neg for PE 1/20.  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 Now tolerating ATC w/ PMV. 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 Now off all abx  6) H/o CAD and HTN: stable   7) DM type II w/ hyperglycemia: per # svc   8) deconditioning.   9) underlying OSA ?   10) C diff    She is looking good from pulm standpoint. Tolerating weaning and ATC trial well. There  is a concern about underlying OSA which has not been studied. This may be a consideration when it comes time to consider decannulation but will depend on her strength and ability to tolerate CPAP.  Although, she seems to be progressing well.   Recommendation Cont ATC as tolerated  Wean steroids to off, on 10 mg daily Completed 8 d total empiric HCAP coverage Completed 14 d acyclovir Cont BDs Wean FIO2 Focus on rehab efforts. / aggressive PT Lasix to neg balance as tolerated   Canary Brim,  NP-C [facility] Pgr: [phone] or [phone]   Pt improving. I have seen and examined this pt and agree with above note.  Dorcas Carrow Beeper  [phone]  Cell  [phone]  If no response or cell goes to voicemail, call beeper [phone]]

## 2012-03-30 ENCOUNTER — Other Ambulatory Visit (HOSPITAL_COMMUNITY): Payer: Medicare Other

## 2012-03-30 LAB — BLOOD GAS, ARTERIAL
Bicarbonate: 35.1 mEq/L — ABNORMAL HIGH (ref 20.0–24.0)
PEEP: 7 cmH2O
Pressure support: 10 cmH2O
pCO2 arterial: 58.1 mmHg (ref 35.0–45.0)
pH, Arterial: 7.398 (ref 7.350–7.450)
pO2, Arterial: 99.5 mmHg (ref 80.0–100.0)

## 2012-03-30 LAB — CBC
Hemoglobin: 12.2 g/dL (ref 12.0–15.0)
MCH: 31 pg (ref 26.0–34.0)
Platelets: 289 10*3/uL (ref 150–400)
RBC: 3.94 MIL/uL (ref 3.87–5.11)
WBC: 9.3 10*3/uL (ref 4.0–10.5)

## 2012-03-30 LAB — BASIC METABOLIC PANEL
CO2: 35 mEq/L — ABNORMAL HIGH (ref 19–32)
Calcium: 9.1 mg/dL (ref 8.4–10.5)
Potassium: 4.7 mEq/L (ref 3.5–5.1)
Sodium: 134 mEq/L — ABNORMAL LOW (ref 135–145)

## 2012-03-31 DIAGNOSIS — Z930 Tracheostomy status: Secondary | ICD-10-CM

## 2012-03-31 NOTE — Progress Notes (Signed)
PULMONARY  / CRITICAL CARE MEDICINE  Name: [name] MRN: [id number] DOB: 12/20/1955    LOS: 19  REFERRING PROVIDER:  Hijazi   CHIEF COMPLAINT:  Vent weaning  This is a 57 Year old White female (active smoker on home O2) who was admitted [facility] 1/27 w/ H1N1, w/ resultant ARDS, complicated by secondary pneumococcal PNA. She had prolonged course requiring tracheostomy to assist w/ weaning. During trach bronch eval demonstrated severe tracheitis. Because of this antifungals were added and  BAL was obtained.  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 [facility].    LINES / TUBES:  ETT 12/31>>1/6  Trach (DF) 1/6>>  NGT>>1/8  PEG 11/8>>   CULTURES:  Influenza pcr 12/27>>>POSITIVE (Flu A/H1N1)  u strep 12/28>>>POSITIVE  u legionella 12/28>>>negative  Urine culture 12/27>>>negative  1/4 BC>>>NTD and pending  1/3 sputum>>>no organisms, non pathogenic oral flora  1/5 sputum>>>no growth, non pathogenic oral flora  1/6 HSV>>> positive * 1/6 BAL>>norm flora    ANTIBIOTICS:  levaquin 12/27>>>1/4  ceftaz 1/4>>>1/7  tamiflu 12/27>>>1/7  Acyclovir (HSV on bronch?) 1/7>>  Primaxin 1/7>> OFF vanc 1/4>>> OFF Flagyl (Cdiff)  SIGNIFICANT EVENTS:  12/27 Admit ICU requiring BiPAP  12/31- ETT  1/6-Trach  1/8-Peg 1/24 - tolerated PMV, ATC 24/7    INTERVAL HISTORY:  Weaning on PSV, up to chair, no distress.  VITAL SIGNS:  Reviewed and unremarkable.   PHYSICAL EXAMINATION: General:  Chronically ill appearing white female.  On ATC Neuro:  Awake and alert. Following commands.  HEENT:  Trach c/d/i, PMV in place, voice clear Cardiovascular:  rrr Lungs: clear  Abdomen:  Soft, non-tender PEG intact  Musculoskeletal:  Intact  Skin:  Intact    Lab 03/30/12 1231 03/28/12 0558 03/26/12 0535  NA 134* 132* 136  K 4.7 3.8 4.2  CL 90* 92* 95*  CO2 35* 34* 34*  BUN 19 15 14   CREATININE 0.73 0.60 0.64  GLUCOSE 231* 212* 309*      Lab 03/30/12 1231 03/28/12 0558 03/26/12 0535  HGB 12.2 11.6* 11.1*  HCT 37.5 36.8 34.8*  WBC 9.3 6.8 5.7  PLT 289 248 226   Dg Chest Port 1 View  03/30/2012  *RADIOLOGY REPORT*  Clinical Data: Shortness of breath.  Chronic ventilator dependent respiratory failure.  PORTABLE CHEST - 1 VIEW 03/30/2012 1232 hours:  Comparison: Portable chest x-ray 03/28/2012, 03/26/2012, 03/22/2012.  Findings: Tracheostomy tube tip in satisfactory position below the thoracic inlet.  Interval improvement in aeration in the lung bases, with resolution of the atelectasis since examination 2 days ago.  Apparent opacity overlying the right upper lobe I believe is external to the patient and may be related to the EKG lead.  No new pulmonary parenchymal abnormalities.  Suboptimal inspiration due to body habitus as noted previously.  Cardiac silhouette upper normal in size to slightly enlarged but stable.  IMPRESSION: Resolution of bibasilar atelectasis since examination 2 days ago. No acute cardiopulmonary disease currently.   Original Report Authenticated By: Hulan Saas, M.D.     ASSESSMENT / PLAN: 1) Acute on Chronic respiratory failure due to H1N1 PNA w/ secondary pneumococcal super-infection, AECOPD and c/b ALI. CXR continues to improve. Now w/ resolving RLL airspace disease.Oxygen and vent requirements improved.  Now on ATC.  CTA of Chest was neg for PE 1/20.  1/22 Tolerating ATC 2/47.  1/27 concern for Acute tracheobronchitis.    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 Now tolerating ATC w/ PMV. 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 Completed 14 d acyclovir  5) HCAP (NOS): clinically improved. Now off all abx  6) H/o CAD and HTN: stable   7) DM type II w/ hyperglycemia: per # svc   8) deconditioning.   9) underlying OSA ?   10) C diff    She is looking good from  pulm standpoint. Tolerating weaning and ATC trial well. There is a concern about underlying OSA which has not been studied. This may be a consideration when it comes time to consider decannulation but will depend on her strength and ability to tolerate CPAP.  1/27 new concern for acute tracheobronchitis  Recommendation Cont ATC as tolerated  Wean steroids to off, on 10 mg daily Cont BDs Wean FIO2 Focus on rehab efforts. / aggressive PT Lasix to neg balance as tolerated Doxycycline for 7 days 1/27 for acute tracheobronchitis F/u cxr am 1/29   Canary Brim, NP-C [facility] Pgr: [phone] or [phone]    Billy Fischer, MD ; [facility] service Mobile [phone].  After 5:30 PM or weekends, call [phone]

## 2012-04-01 ENCOUNTER — Other Ambulatory Visit (HOSPITAL_COMMUNITY): Payer: Medicare Other

## 2012-04-01 LAB — RENAL FUNCTION PANEL
BUN: 30 mg/dL — ABNORMAL HIGH (ref 6–23)
Chloride: 89 mEq/L — ABNORMAL LOW (ref 96–112)
Glucose, Bld: 480 mg/dL — ABNORMAL HIGH (ref 70–99)
Potassium: 5 mEq/L (ref 3.5–5.1)

## 2012-04-02 ENCOUNTER — Other Ambulatory Visit (HOSPITAL_COMMUNITY): Payer: Medicare Other

## 2012-04-02 LAB — RENAL FUNCTION PANEL
CO2: 35 mEq/L — ABNORMAL HIGH (ref 19–32)
Chloride: 91 mEq/L — ABNORMAL LOW (ref 96–112)
GFR calc Af Amer: >90 mL/min (ref >90)
Glucose, Bld: 264 mg/dL — ABNORMAL HIGH (ref 70–99)
Potassium: 5.2 mEq/L — ABNORMAL HIGH (ref 3.5–5.1)
Sodium: 134 mEq/L — ABNORMAL LOW (ref 135–145)

## 2012-04-04 DIAGNOSIS — Z930 Tracheostomy status: Secondary | ICD-10-CM

## 2012-04-04 LAB — CBC
HCT: 38.6 % (ref 36.0–46.0)
Hemoglobin: 12.6 g/dL (ref 12.0–15.0)
RDW: 15.3 % (ref 11.5–15.5)
WBC: 13.1 10*3/uL — ABNORMAL HIGH (ref 4.0–10.5)

## 2012-04-04 LAB — BASIC METABOLIC PANEL
BUN: 38 mg/dL — ABNORMAL HIGH (ref 6–23)
Chloride: 86 mEq/L — ABNORMAL LOW (ref 96–112)
GFR calc Af Amer: >90 mL/min (ref >90)
Glucose, Bld: 418 mg/dL — ABNORMAL HIGH (ref 70–99)
Potassium: 5.7 mEq/L — ABNORMAL HIGH (ref 3.5–5.1)

## 2012-04-04 NOTE — Progress Notes (Signed)
PULMONARY  / CRITICAL CARE MEDICINE  Name: [name] MRN: [id number] DOB: [birth year]    LOS: 23  REFERRING PROVIDER:  Hijazi   CHIEF COMPLAINT:  Vent weaning  Brief: 57 Year old White female (active smoker on home O2) who was admitted to [facility] on 1/27 w/ H1N1, w/ resultant ARDS, complicated by secondary pneumococcal PNA. She had prolonged course requiring tracheostomy to assist w/ weaning. During trach bronch eval demonstrated severe tracheitis. Because of this antifungals were added and  BAL was obtained.  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 [facility].    LINES / TUBES:  ETT 12/31>>1/6  Trach (DF) 1/6>>  NGT>>1/8  PEG 11/8>>   CULTURES:  Influenza pcr 12/27>>>POSITIVE (Flu A/H1N1)  u strep 12/28>>>POSITIVE  u legionella 12/28>>>negative  Urine culture 12/27>>>negative  1/4 BC>>>NTD and pending  1/3 sputum>>>no organisms, non pathogenic oral flora  1/5 sputum>>>no growth, non pathogenic oral flora  1/6 HSV>>> positive  1/6 BAL>>norm flora    ANTIBIOTICS:  levaquin 12/27>>>1/4  ceftaz 1/4>>>1/7  tamiflu 12/27>>>1/7  Acyclovir (HSV on bronch?) 1/7>>  Primaxin 1/7>> OFF vanc 1/4>>> OFF Flagyl (Cdiff)  SIGNIFICANT EVENTS:  12/27 Admit ICU requiring BiPAP  12/31- ETT  1/6-Trach  1/8-Peg 1/24 - tolerated PMV, ATC 24/7    INTERVAL HISTORY:  Weaning on PSV, up to chair, no distress.  VITAL SIGNS:  Reviewed and unremarkable.   PHYSICAL EXAMINATION: General:  Chronically ill appearing white female.  Tolerating ATC Neuro:  Awake and alert. Following commands.  HEENT:  Trach c/d/i, PMV in place, voice clear.  Secretions improved.  Cardiovascular:  rrr Lungs: clear  Abdomen:  Soft, non-tender PEG intact  Musculoskeletal:  Intact  Skin:  Intact    Lab 04/04/12 0710 04/02/12 0525 04/01/12 1312  NA 127* 134* 133*  K 5.7* 5.2* 5.0  CL 86* 91* 89*  CO2 33* 35* 34*  BUN 38* 33* 30*  CREATININE 0.69  0.63 0.71  GLUCOSE 418* 264* 480*    Lab 04/04/12 0710 03/30/12 1231  HGB 12.6 12.2  HCT 38.6 37.5  WBC 13.1* 9.3  PLT 289 289   No results found.  ASSESSMENT / PLAN: 1) Acute on Chronic respiratory failure due to H1N1 PNA w/ secondary pneumococcal super-infection, AECOPD and c/b ALI. CXR continues to improve. Now w/ resolving RLL airspace disease.Oxygen and vent requirements improved.  Now on ATC. CTA of Chest was neg for PE 1/20.  1/22 Tolerating ATC 2/47.  1/27 concern for Acute tracheobronchitis.  Improved with doxycycline.   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 Now tolerating ATC w/ PMV. 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 Completed 14 d acyclovir  5) HCAP (NOS): clinically improved. Now off all abx  6) H/o CAD and HTN: stable   7) DM type II w/ hyperglycemia: per # svc   8) deconditioning.   9) underlying OSA ?   10) C diff    She is looking good from pulm standpoint. Tolerating weaning and ATC trial well. There is a concern about underlying OSA which has not been studied. This may be a consideration when it comes time to consider decannulation but will depend on her strength and ability to tolerate CPAP.  1/27 new concern for acute tracheobronchitis  Recommendation Cont ATC as tolerated  Recommend change steroids to po prednisone and  taper slowly to off Cont BDs Wean FIO2 - sat goal 90-94% Focus on rehab efforts. / aggressive PT Lasix to neg balance as tolerated Doxycycline for 7 days 1/27 for acute tracheobronchitis F/u cxr am 1/29 Will need outpatient sleep study at later time but this does not mean she can not be decannulated if O2 req can be weaned / tolerated.  Would like to see that she would use CPAP if decannulated.  If O2 req's improve, consider capping trach and allow CPAP at night to determine how she tolerates.      Canary Brim, NP-C [facility] Pgr: [phone] or [phone]     Billy Fischer, MD ; [facility] service Mobile [phone].  After 5:30 PM or weekends, call [phone]

## 2012-04-06 LAB — CBC
Hemoglobin: 12.1 g/dL (ref 12.0–15.0)
MCH: 31.2 pg (ref 26.0–34.0)
RBC: 3.88 MIL/uL (ref 3.87–5.11)

## 2012-04-06 LAB — BASIC METABOLIC PANEL
CO2: 37 mEq/L — ABNORMAL HIGH (ref 19–32)
Calcium: 9.2 mg/dL (ref 8.4–10.5)
Chloride: 90 mEq/L — ABNORMAL LOW (ref 96–112)
Glucose, Bld: 169 mg/dL — ABNORMAL HIGH (ref 70–99)
Sodium: 130 mEq/L — ABNORMAL LOW (ref 135–145)

## 2012-04-07 LAB — CBC
HCT: 37.3 % (ref 36.0–46.0)
Hemoglobin: 12.1 g/dL (ref 12.0–15.0)
MCH: 30.9 pg (ref 26.0–34.0)
MCV: 95.2 fL (ref 78.0–100.0)
RBC: 3.92 MIL/uL (ref 3.87–5.11)

## 2012-04-07 NOTE — Progress Notes (Signed)
PULMONARY  / CRITICAL CARE MEDICINE  Name: [name] MRN: [id number] DOB: 05-Jul-1955    LOS: 26  REFERRING PROVIDER:  Hijazi   CHIEF COMPLAINT:  Vent weaning  Brief: 57 Year old White female (active smoker on home O2) who was admitted to [facility] on 1/27 w/ H1N1, w/ resultant ARDS, complicated by secondary pneumococcal PNA. She had prolonged course requiring tracheostomy to assist w/ weaning. During trach bronch eval demonstrated severe tracheitis. Because of this antifungals were added and  BAL was obtained.  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 [facility].    LINES / TUBES:  ETT 12/31>>1/6  Trach (DF) 1/6>>  NGT>>1/8  PEG 11/8>>   CULTURES:  Influenza pcr 12/27>>>POSITIVE (Flu A/H1N1)  u strep 12/28>>>POSITIVE  u legionella 12/28>>>negative  Urine culture 12/27>>>negative  1/4 BC>>>NTD and pending  1/3 sputum>>>no organisms, non pathogenic oral flora  1/5 sputum>>>no growth, non pathogenic oral flora  1/6 HSV>>> positive  1/6 BAL>>norm flora    ANTIBIOTICS:  levaquin 12/27>>>1/4  ceftaz 1/4>>>1/7  tamiflu 12/27>>>1/7  Acyclovir (HSV on bronch?) 1/7>>  Primaxin 1/7>> OFF vanc 1/4>>> OFF Flagyl (Cdiff)  SIGNIFICANT EVENTS:  12/27 Admit ICU requiring BiPAP  12/31- ETT  1/6-Trach  1/8-Peg 1/24 - tolerated PMV, ATC 24/7 04/04/2012: Weaning on pressure support   INTERVAL HISTORY:  04/07/2012. Her tracheostomy is now capped for 24 hours  VITAL SIGNS:  Temp 97.5. Blood pressure 102/62. Pulse of 73. Respirator 25 per minute. Pulse ox of 97%. Blood sugar 170.Marland Kitchen   PHYSICAL EXAMINATION: General:  Chronically ill appearing white female.   Neuro:  Awake and alert. Following commands.  HEENT:  Trach c/d/i, PMV in place, voice clear.  Secretions improved. Her tracheostomy has a cap on t Cardiovascular:  rrr Lungs: clear  Abdomen:  Soft, non-tender PEG intact  Musculoskeletal:  Intact  Skin:  Intact    Lab  04/06/12 0725 04/04/12 0710 04/02/12 0525  NA 130* 127* 134*  K 4.9 5.7* 5.2*  CL 90* 86* 91*  CO2 37* 33* 35*  BUN 27* 38* 33*  CREATININE 0.67 0.69 0.63  GLUCOSE 169* 418* 264*    Lab 04/07/12 0620 04/06/12 0725 04/04/12 0710  HGB 12.1 12.1 12.6  HCT 37.3 36.9 38.6  WBC 17.0* 15.6* 13.1*  PLT 306 301 289   No results found.  ASSESSMENT / PLAN: 1) Acute on Chronic respiratory failure due to H1N1 PNA w/ secondary pneumococcal super-infection, AECOPD and c/b ALI. CXR continues to improve. Now w/ resolving RLL airspace disease.Oxygen and vent requirements improved.  Now on ATC. CTA of Chest was neg for PE 1/20.  1/22 Tolerating ATC 2/47.  1/27 concern for Acute tracheobronchitis.  Improved with doxycycline.   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 Now tolerating ATC w/ PMV. 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 Completed 14 d acyclovir  5) HCAP (NOS): clinically improved. Now off all abx  6) H/o CAD and HTN: stable   7) DM type II w/ hyperglycemia: per # svc   8) deconditioning.   9) underlying OSA ?   10) C diff   a 04/07/2012: She is looking good from pulm standpoint. There is a concern about underlying OSA which has not been studied. This may be a consideration when it comes time to consider decannulation but will depend on her  strength and ability to tolerate CPAP.     Recommendation Cont ATC as tolerated  Recommend change steroids to po prednisone and taper slowly to off Cont BDs Wean FIO2 - sat goal 90-94% Focus on rehab efforts. / aggressive PT Lasix to neg balance as tolerated Doxycycline for 7 days from 1/27 for acute tracheobronchitis   Will need outpatient sleep study at later time but this does not mean she can not be decannulated if O2 req can be weaned / tolerated.  Would like to see that she would use CPAP if decannulated.   If O2 req's improve, consider capping trach and allow CPAP at night to determine how she tolerates. Therefore 04/07/2012 we will try CPAP each bedtime    Dr. Kalman Shan, M.D., [facility].C.P Pulmonary and Critical Care Medicine Staff Physician [place] [facility] [place] [facility] Pager: [phone], If no answer or between  15:00h - 7:00h: call [phone]  04/07/2012 10:54 AM

## 2012-04-08 ENCOUNTER — Institutional Professional Consult (permissible substitution) (HOSPITAL_COMMUNITY): Payer: Medicare Other

## 2012-04-08 LAB — CBC
HCT: 37.5 % (ref 36.0–46.0)
MCHC: 33.3 g/dL (ref 30.0–36.0)
RDW: 16.2 % — ABNORMAL HIGH (ref 11.5–15.5)

## 2012-04-09 LAB — EXPECTORATED SPUTUM ASSESSMENT W GRAM STAIN, RFLX TO RESP C: Special Requests: NORMAL

## 2012-04-09 LAB — HEMOGLOBIN AND HEMATOCRIT, BLOOD: HCT: 38.5 % (ref 36.0–46.0)

## 2012-04-09 LAB — BASIC METABOLIC PANEL
CO2: 40 mEq/L (ref 19–32)
Calcium: 10 mg/dL (ref 8.4–10.5)
GFR calc Af Amer: >90 mL/min (ref >90)
GFR calc non Af Amer: >90 mL/min (ref >90)
Sodium: 134 mEq/L — ABNORMAL LOW (ref 135–145)

## 2012-04-09 LAB — URINALYSIS, ROUTINE W REFLEX MICROSCOPIC
Glucose, UA: >1000 mg/dL — AB
Leukocytes, UA: NEGATIVE
Nitrite: NEGATIVE
Protein, ur: NEGATIVE mg/dL
Urobilinogen, UA: 0.2 mg/dL (ref 0.0–1.0)

## 2012-04-09 LAB — CBC
MCH: 30.2 pg (ref 26.0–34.0)
Platelets: 301 10*3/uL (ref 150–400)
RBC: 4.01 MIL/uL (ref 3.87–5.11)

## 2012-04-09 LAB — URINE MICROSCOPIC-ADD ON

## 2012-04-09 NOTE — Progress Notes (Signed)
STAFF NOTE: I, Dr Lavinia Sharps have personally reviewed patient's available data, including medical history, events of note, physical examination and test results as part of my evaluation. I have discussed with resident/NP and other care providers such as pharmacist, RN and RRT.  In addition,  I personally evaluated patient and elicited key findings of acute on chronic respiratory failure now status post tracheostomy with her tracheostomy having a cap on it. She would need continued CPAP q. at bedtime until she is followed up as an outpatient for sleep studies.  Rest per NP/medical resident whose note is outlined above and that I agree with    Dr. Kalman Shan, M.D., [facility].C.P Pulmonary and Critical Care Medicine Staff Physician [place] [facility] [place] [facility] Pager: [phone], If no answer or between  15:00h - 7:00h: call [phone]  04/09/2012 10:49 AM

## 2012-04-09 NOTE — Progress Notes (Signed)
PULMONARY  / CRITICAL CARE MEDICINE  Name: [name] MRN: [id number] DOB: 03-09-1955    LOS: 28  REFERRING PROVIDER:  Hijazi   CHIEF COMPLAINT:  Vent weaning  Brief: 57 Year old White female (active smoker on home O2) who was admitted to [facility] on 1/27 w/ H1N1, w/ resultant ARDS, complicated by secondary pneumococcal PNA. She had prolonged course requiring tracheostomy to assist w/ weaning. During trach bronch eval demonstrated severe tracheitis. Because of this antifungals were added and  BAL was obtained.  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 [facility].    LINES / TUBES:  ETT 12/31>>1/6  Trach (DF) 1/6>>  NGT>>1/8  PEG 11/8>>   CULTURES:  Influenza pcr 12/27>>>POSITIVE (Flu A/H1N1)  u strep 12/28>>>POSITIVE  u legionella 12/28>>>negative  Urine culture 12/27>>>negative  1/4 BC>>>NTD and pending  1/3 sputum>>>no organisms, non pathogenic oral flora  1/5 sputum>>>no growth, non pathogenic oral flora  1/6 HSV>>> positive  1/6 BAL>>norm flora    ANTIBIOTICS:  levaquin 12/27>>>1/4  ceftaz 1/4>>>1/7  tamiflu 12/27>>>1/7  Acyclovir (HSV on bronch?) 1/7>>  Primaxin 1/7>> OFF vanc 1/4>>> OFF Flagyl (Cdiff)  SIGNIFICANT EVENTS:  12/27 Admit ICU requiring BiPAP  12/31- ETT  1/6-Trach  1/8-Peg 1/24 - tolerated PMV, ATC 24/7 04/04/2012: Weaning on pressure support   INTERVAL HISTORY:  04/07/2012. Her tracheostomy is now capped for 24 hours  VITAL SIGNS:  Afebrile, sats 96% on 3 liters   PHYSICAL EXAMINATION: General:  Chronically ill appearing white female.   Neuro:  Awake and alert. Following commands.  HEENT:  Trach c/d/i, PMV in place, voice clear.  Secretions improved. Her tracheostomy has a cap on t Cardiovascular:  rrr Lungs: clear  Abdomen:  Soft, non-tender PEG intact  Musculoskeletal:  Intact  Skin:  Intact    Lab 04/09/12 0535 04/06/12 0725 04/04/12 0710  NA 134* 130* 127*  K 4.6 4.9 5.7*   CL 89* 90* 86*  CO2 40* 37* 33*  BUN 26* 27* 38*  CREATININE 0.73 0.67 0.69  GLUCOSE 100* 169* 418*    Lab 04/09/12 0535 04/08/12 0737 04/07/12 0620  HGB 12.1 12.5 12.1  HCT 38.3 37.5 37.3  WBC 16.1* 19.4* 17.0*  PLT 301 307 306   Dg Chest Port 1 View  04/08/2012  *RADIOLOGY REPORT*  Clinical Data: Short of breath  PORTABLE CHEST - 1 VIEW  Comparison: Portable chest x-ray of 03/30/2012  Findings: Aeration has improved slightly with only mild bibasilar linear atelectasis remaining.  Mild cardiomegaly is stable. Tracheostomy remains.  IMPRESSION: Slightly better aeration with mild bibasilar linear atelectasis remaining.   Original Report Authenticated By: Dwyane Dee, M.D.     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 Now tolerating ATC w/ PMV. 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 Completed 14 d acyclovir  5) HCAP (NOS): clinically improved. Now off all abx  6) H/o CAD and HTN: stable   7) DM type II w/ hyperglycemia: per # svc   8) deconditioning.   9) underlying OSA ?   10) C diff   Will need outpatient sleep study at later time but this does not mean she can not be decannulated if O2  req can be weaned / tolerated.  Would like to see that she would use CPAP if decannulated.  If O2 req's improve, consider capping trach and allow CPAP at night to determine how she tolerates. Therefore 04/07/2012 we will try CPAP each bedtime    Recommendation Cont ATC as tolerated, down size to 4 cuffless and cap.  CPAP at HS, will eventually need PSG.   prednisone,  taper slowly to off Cont BDs Wean FIO2 - sat goal 90-94% Focus on rehab efforts. / aggressive PT Lasix to neg balance as  tolerated         04/09/2012 8:39 AM

## 2012-04-10 LAB — POTASSIUM: Potassium: 4 mEq/L (ref 3.5–5.1)

## 2012-04-12 LAB — CBC
HCT: 35.3 % — ABNORMAL LOW (ref 36.0–46.0)
MCV: 95.7 fL (ref 78.0–100.0)
Platelets: 295 10*3/uL (ref 150–400)
RBC: 3.69 MIL/uL — ABNORMAL LOW (ref 3.87–5.11)
WBC: 13.6 10*3/uL — ABNORMAL HIGH (ref 4.0–10.5)

## 2012-04-12 LAB — RENAL FUNCTION PANEL
BUN: 13 mg/dL (ref 6–23)
CO2: 40 mEq/L (ref 19–32)
Chloride: 87 mEq/L — ABNORMAL LOW (ref 96–112)
Creatinine, Ser: 0.64 mg/dL (ref 0.50–1.10)
GFR calc non Af Amer: >90 mL/min (ref >90)
Potassium: 4.5 mEq/L (ref 3.5–5.1)

## 2012-04-14 DIAGNOSIS — J449 Chronic obstructive pulmonary disease, unspecified: Secondary | ICD-10-CM

## 2012-04-14 DIAGNOSIS — J811 Chronic pulmonary edema: Secondary | ICD-10-CM

## 2012-04-14 NOTE — Progress Notes (Addendum)
PULMONARY  / CRITICAL CARE MEDICINE  Name: [name] MRN: [id number] DOB: [birth year]    LOS: 33  REFERRING PROVIDER:  Hijazi   CHIEF COMPLAINT:  Vent weaning  Brief: 57 Year old White female (active smoker on home O2) who was admitted to [facility] on 1/27 w/ H1N1, w/ resultant ARDS, complicated by secondary pneumococcal PNA. She had prolonged course requiring tracheostomy to assist w/ weaning. During trach bronch eval demonstrated severe tracheitis. Because of this antifungals were added and  BAL was obtained.  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 [facility].    LINES / TUBES:  ETT 12/31>>1/6  Trach (DF) 1/6>>  NGT>>1/8  PEG 11/8>>   CULTURES:  Influenza pcr 12/27>>>POSITIVE (Flu A/H1N1)  u strep 12/28>>>POSITIVE  u legionella 12/28>>>negative  Urine culture 12/27>>>negative  1/4 BC>>>NTD and pending  1/3 sputum>>>no organisms, non pathogenic oral flora  1/5 sputum>>>no growth, non pathogenic oral flora  1/6 HSV>>> positive  1/6 BAL>>norm flora    ANTIBIOTICS:  levaquin 12/27>>>1/4  ceftaz 1/4>>>1/7  tamiflu 12/27>>>1/7  Acyclovir (HSV on bronch?) 1/7>>  Primaxin 1/7>> OFF vanc 1/4>>> OFF Flagyl (Cdiff)  SIGNIFICANT EVENTS:  12/27 Admit ICU requiring BiPAP  12/31- ETT  1/6-Trach  1/8-Peg 1/24 - tolerated PMV, ATC 24/7 04/04/2012: Weaning on pressure support   INTERVAL HISTORY:  04/07/2012. Her tracheostomy is now capped for 24 hours  VITAL SIGNS:  Afebrile, sats 96% on 3 liters   PHYSICAL EXAMINATION: General:  Chronically ill appearing white female.   Neuro:  Awake and alert. Following commands.  HEENT:  Trach c/d/i, PMV in place, voice clear.  t Cardiovascular:  rrr Lungs: clear  Abdomen:  Soft, non-tender PEG intact  Musculoskeletal:  Intact  Skin:  Intact    Recent Labs Lab 04/09/12 0535 04/10/12 0547 04/12/12 0640  NA 134*  --  134*  K 4.6 4.0 4.5  CL 89*  --  87*  CO2 40*  --  40*   BUN 26*  --  13  CREATININE 0.73  --  0.64  GLUCOSE 100*  --  90    Recent Labs Lab 04/08/12 0737 04/09/12 0535 04/09/12 1148 04/12/12 0640  HGB 12.5 12.1 12.6 11.2*  HCT 37.5 38.3 38.5 35.3*  WBC 19.4* 16.1*  --  13.6*  PLT 307 301  --  295   No results found.  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 Now tolerating ATC w/ PMV. Improved 3) Presumed severe COPD: s/p acute exacerbation.  4) HSV tracheitis: identified via BAL on 58/16. Completed 14 d acyclovir 5) HCAP (NOS): clinically improved. Now off all abx 6) H/o CAD and HTN: stable  7) DM type II w/ hyperglycemia: per # svc  8) deconditioning.  9) underlying OSA ? Did not tolerate CPAP w/ trach, but willing to try after trach heals.  10) C diff   Will need outpatient sleep study. Has been off CPAP for over a week without issue. She looks ready to decannulate.   Recommendation decannulate prednisone,  taper slowly to off Cont BDs Home w/ oxygen Have set her up for the following post-hospital appointments.  [place] [facility]: Tammy parrett NP-C: Feb 17th at 1130am Dr Cyril Mourning March 13th at 230p.  She will need formal PSG at  some point to determine need for CPAP.   Patient decannulated successfully with no evidence of complications.  Patient seen and examined, agree with above note.  I dictated the care and orders written for this patient under my direction.  Alyson Reedy, MD [phone]

## 2012-04-21 ENCOUNTER — Encounter: Payer: Self-pay | Admitting: Adult Health

## 2012-04-21 ENCOUNTER — Ambulatory Visit (INDEPENDENT_AMBULATORY_CARE_PROVIDER_SITE_OTHER): Payer: Medicare Other | Admitting: Adult Health

## 2012-04-21 VITALS — BP 118/82 | HR 98 | Temp 97.7°F | Ht 65.5 in | Wt 226.8 lb

## 2012-04-21 DIAGNOSIS — J80 Acute respiratory distress syndrome: Secondary | ICD-10-CM

## 2012-04-21 DIAGNOSIS — J9589 Other postprocedural complications and disorders of respiratory system, not elsewhere classified: Secondary | ICD-10-CM

## 2012-04-21 DIAGNOSIS — K219 Gastro-esophageal reflux disease without esophagitis: Secondary | ICD-10-CM

## 2012-04-21 DIAGNOSIS — J189 Pneumonia, unspecified organism: Secondary | ICD-10-CM

## 2012-04-21 DIAGNOSIS — J449 Chronic obstructive pulmonary disease, unspecified: Secondary | ICD-10-CM

## 2012-04-21 DIAGNOSIS — Y95 Nosocomial condition: Secondary | ICD-10-CM

## 2012-04-21 DIAGNOSIS — J9600 Acute respiratory failure, unspecified whether with hypoxia or hypercapnia: Secondary | ICD-10-CM

## 2012-04-21 MED ORDER — GABAPENTIN 100 MG PO CAPS: 100.0000 mg | ORAL_CAPSULE | Freq: Three times a day (TID) | ORAL | Status: DC

## 2012-04-21 NOTE — Assessment & Plan Note (Signed)
Recent flare during critical illness  Congratulated on smoking cessation  Cont advair  PFT in future

## 2012-04-21 NOTE — Patient Instructions (Addendum)
Continue on ADvair 1 puff Twice daily  -brush/rinse/gargle after use Oxygen 3 l/m continuous  PT at home.  Very proud of no smoking.  I will fill Neurontin this month, discuss with family MD for future rx.  Clean trach site daily , call if redness or  Drainage follow up 2 weeks with Dr. Vassie Loll   We will discuss G Tube removal at that time.  Please contact office for sooner follow up if symptoms do not improve or worsen or seek emergency care

## 2012-04-21 NOTE — Assessment & Plan Note (Signed)
S/p G tube placement  Will need to have removed in future

## 2012-04-21 NOTE — Progress Notes (Addendum)
Subjective:    Patient ID: [name], female    DOB: April 08, 1955, 57 y.o.   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 She reports sporadic compliance with this. She has cut down form 1.5 to 1 PPD. She is unable to afford medciations bt has been getting a supply form her mother of symbicort & singulair. She denies heartburn, sinus drainage, seasonal allergies or excessive daytime somnolence. She wants a letter for disability. >>smoking cessation, cont O2   04/21/2012 Post Hospital follow up  Returns for post hospital follow up .  Admitted 02/29/12 w/ H1N1 w/ ARDS complicated by secondary pneumococcal PNA . She had a prolonged course w/ tracheostomy . 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 Requried transfer [facility] for [facility]aning. Was sucessfully liberated from vent and decannulated 2/10.  Discharged on prednisone taper.  And Advair and As needed  Ventolin.  Has been recommended for OP sleep evaluation when ready. (once trach site heals)   Since discharge she is feeling very weak.  Wearing O2 at 3L/m  Trach site is doing well , no redness, no drainage. No fever.  Has not restarted smoking -  Family smokes, but outside.  Has G-Tube (placed IR 1/9 )  Not using . Plans for removal.  currenlty on reg diet. No trouble w/ swallowing.  Good appetite and no n/v/d .  Lost 30lbs during admission.  Has diffused muscle weakness.  Starting PT at home.        Review of Systems Constitutional:   No  weight loss, night sweats,  Fevers, chills,  +fatigue, or  lassitude.  HEENT:   No headaches,   Difficulty swallowing,  Tooth/dental problems, or  Sore throat,                No sneezing, itching, ear ache, nasal congestion, post nasal drip,   CV:  No chest pain,  Orthopnea, PND, swelling in lower extremities, anasarca, dizziness, palpitations, syncope.   GI  No heartburn, indigestion, abdominal pain, nausea, vomiting, diarrhea, change in bowel habits, loss of appetite, bloody stools.   Resp:   No change in color of mucus.  No wheezing.  No chest wall deformity  Skin: no rash or lesions.  GU: no dysuria, change in color of urine, no urgency or frequency.  No flank pain, no hematuria   MS:  No joint pain or swelling.  No decreased range of motion.  No back pain.  Psych:  No change in mood or affect. No depression or anxiety.  No memory loss.     '    Objective:   Physical Exam GEN: A/Ox3; pleasant , NAD overweight, chronically ill appearing   HEENT:  [place]/AT,  EACs-clear, TMs-wnl, NOSE-clear, THROAT-clear, no lesions, no postnasal drip or exudate noted.   NECK:  Supple w/ fair ROM; no JVD; normal carotid impulses w/o bruits; no thyromegaly or nodules palpated; no lymphadenopathy.trach site clean nearly closed, no redness   RESP  Diminished BS in bases  w/o, wheezes/ rales/ or rhonchi.no accessory muscle use, no dullness to percussion  CARD:  RRR,  no m/r/g  , no peripheral edema, pulses intact, no cyanosis or clubbing.  GI:   Soft & nt; nml bowel sounds; no organomegaly or masses detected.GTube in place, no redness   Musco: Warm bil, no deformities or joint swelling noted.   Neuro: alert, no focal deficits noted.    Skin: Warm, no lesions or rashes         Assessment & Plan:

## 2012-04-21 NOTE — Assessment & Plan Note (Signed)
Secondary to H1N1 and PNA  Improved with abx  Check xray today

## 2012-04-21 NOTE — Assessment & Plan Note (Addendum)
S/p prolonged critical illness requirng trach now decanulated.  Cont on current regimen w/ O2  Set up for sleep study in future

## 2012-05-01 ENCOUNTER — Telehealth: Payer: Self-pay | Admitting: Pulmonary Disease

## 2012-05-01 NOTE — Telephone Encounter (Signed)
Tammy, can you advise if her pEG can be removed? Has she regained wt? If so, refer to IR for removal

## 2012-05-01 NOTE — Telephone Encounter (Signed)
That is fine , refer her to IR to have it removed   Thanks

## 2012-05-01 NOTE — Telephone Encounter (Signed)
Spoke with pt She states that she is wanting to have her feeding tube removed asap  She has not been using the tube When nurse changed bandage today there was blood on it and the area where tube is placed feels sore Please advise asap thanks!

## 2012-05-01 NOTE — Telephone Encounter (Signed)
Pt aware we will send an order to the [facility] to have feeding tube removed and once scheduled they will call her. Pt verbalized understanding.

## 2012-05-02 ENCOUNTER — Ambulatory Visit (HOSPITAL_COMMUNITY)
Admission: RE | Admit: 2012-05-02 | Discharge: 2012-05-02 | Disposition: A | Discharge status: Home / Self Care | Payer: Medicare Other | Source: Ambulatory Visit | Attending: Pulmonary Disease | Admitting: Pulmonary Disease

## 2012-05-02 ENCOUNTER — Other Ambulatory Visit: Payer: Self-pay | Admitting: Pulmonary Disease

## 2012-05-02 DIAGNOSIS — R633 Feeding difficulties: Secondary | ICD-10-CM

## 2012-05-02 NOTE — Progress Notes (Signed)
Patient ID: [name], female   DOB: 1955-06-20, 58 y.o.   MRN: [id number]  Successful bedside removal of pull-through G-tube without complication.  Dressing placed.  Patient tolerated procedure well without immediate post procedural complication.

## 2012-05-03 ENCOUNTER — Emergency Department (HOSPITAL_COMMUNITY): Payer: Medicare Other

## 2012-05-03 ENCOUNTER — Emergency Department (HOSPITAL_COMMUNITY)
Admission: EM | Admit: 2012-05-03 | Discharge: 2012-05-03 | Disposition: A | Discharge status: Home / Self Care | Payer: Medicare Other | Attending: Emergency Medicine | Admitting: Emergency Medicine

## 2012-05-03 ENCOUNTER — Encounter (HOSPITAL_COMMUNITY): Payer: Self-pay

## 2012-05-03 DIAGNOSIS — Z794 Long term (current) use of insulin: Secondary | ICD-10-CM | POA: Insufficient documentation

## 2012-05-03 DIAGNOSIS — Y838 Other surgical procedures as the cause of abnormal reaction of the patient, or of later complication, without mention of misadventure at the time of the procedure: Secondary | ICD-10-CM | POA: Insufficient documentation

## 2012-05-03 DIAGNOSIS — I252 Old myocardial infarction: Secondary | ICD-10-CM | POA: Insufficient documentation

## 2012-05-03 DIAGNOSIS — Z87891 Personal history of nicotine dependence: Secondary | ICD-10-CM | POA: Insufficient documentation

## 2012-05-03 DIAGNOSIS — K219 Gastro-esophageal reflux disease without esophagitis: Secondary | ICD-10-CM | POA: Insufficient documentation

## 2012-05-03 DIAGNOSIS — E119 Type 2 diabetes mellitus without complications: Secondary | ICD-10-CM | POA: Insufficient documentation

## 2012-05-03 DIAGNOSIS — Z8709 Personal history of other diseases of the respiratory system: Secondary | ICD-10-CM | POA: Insufficient documentation

## 2012-05-03 DIAGNOSIS — I10 Essential (primary) hypertension: Secondary | ICD-10-CM | POA: Insufficient documentation

## 2012-05-03 DIAGNOSIS — G8929 Other chronic pain: Secondary | ICD-10-CM | POA: Insufficient documentation

## 2012-05-03 DIAGNOSIS — Z79899 Other long term (current) drug therapy: Secondary | ICD-10-CM | POA: Insufficient documentation

## 2012-05-03 DIAGNOSIS — E785 Hyperlipidemia, unspecified: Secondary | ICD-10-CM | POA: Insufficient documentation

## 2012-05-03 DIAGNOSIS — IMO0002 Reserved for concepts with insufficient information to code with codable children: Secondary | ICD-10-CM | POA: Insufficient documentation

## 2012-05-03 DIAGNOSIS — Z7982 Long term (current) use of aspirin: Secondary | ICD-10-CM | POA: Insufficient documentation

## 2012-05-03 DIAGNOSIS — J4489 Other specified chronic obstructive pulmonary disease: Secondary | ICD-10-CM | POA: Insufficient documentation

## 2012-05-03 DIAGNOSIS — M129 Arthropathy, unspecified: Secondary | ICD-10-CM | POA: Insufficient documentation

## 2012-05-03 LAB — CBC WITH DIFFERENTIAL/PLATELET
Basophils Relative: 0 % (ref 0–1)
Eosinophils Relative: 1 % (ref 0–5)
HCT: 32.1 % — ABNORMAL LOW (ref 36.0–46.0)
Hemoglobin: 10.1 g/dL — ABNORMAL LOW (ref 12.0–15.0)
MCHC: 31.5 g/dL (ref 30.0–36.0)
MCV: 97 fL (ref 78.0–100.0)
Monocytes Absolute: 0.5 10*3/uL (ref 0.1–1.0)
Monocytes Relative: 4 % (ref 3–12)
Neutro Abs: 11.1 10*3/uL — ABNORMAL HIGH (ref 1.7–7.7)

## 2012-05-03 LAB — BASIC METABOLIC PANEL
BUN: 13 mg/dL (ref 6–23)
CO2: 38 mEq/L — ABNORMAL HIGH (ref 19–32)
Chloride: 91 mEq/L — ABNORMAL LOW (ref 96–112)
Creatinine, Ser: 0.77 mg/dL (ref 0.50–1.10)
GFR calc Af Amer: >90 mL/min (ref >90)

## 2012-05-03 NOTE — ED Provider Notes (Signed)
History     CSN: [id number]  Arrival date & time 05/03/12  1252   First MD Initiated Contact with Patient 05/03/12 1314      Chief Complaint  Patient presents with  . Wound Check    (Consider location/radiation/quality/duration/timing/severity/associated sxs/prior treatment) HPI  The patient presents with concern of continued drainage from the removal site of a peg tube that was removed yesterday.  She had the PEG tube placed more than one month ago after an episode of critical illness requiring intubation, ICU care for a prolonged time.  She states that she is recovered generally well.  Yesterday after the tube was removed she began to have oozing from the site.  No new pain that is consistent, though there is waxing/waning pain in the epigastrium.  There is no new fever, no nausea, no diarrhea, no new anorexia, no new confusion or any other new focal complaints.   Past Medical History  Diagnosis Date  . COPD (chronic obstructive pulmonary disease)   . Respiratory failure   . Morbid obesity   . Tobacco abuse   . GERD (gastroesophageal reflux disease)   . Hyperlipidemia   . Chronic pain   . Hypertension   . Heart attack   . Arthritis   . Allergic rhinitis   . Diabetes mellitus, type 2   . Diabetes mellitus type 2 in obese 02/29/2012  . HTN (hypertension) 02/29/2012    Past Surgical History  Procedure Laterality Date  . Cholecystectomy    . Partial hysterectomy    . Angioplasty    . Umbilical hernia repair  12/2009    History reviewed. No pertinent family history.  History  Substance Use Topics  . Smoking status: Former Smoker -- 1.00 packs/day for 40 years    Types: Cigarettes    Quit date: 03/04/2012  . Smokeless tobacco: Never Used  . Alcohol Use: No    OB History   Grav Para Term Preterm Abortions TAB SAB Ect Mult Living                  Review of Systems  Constitutional:       Per HPI, otherwise negative  HENT:       Per HPI, otherwise negative    Respiratory:       Per HPI, otherwise negative  Cardiovascular:       Per HPI, otherwise negative  Gastrointestinal: Negative for vomiting.  Endocrine:       Negative aside from HPI  Genitourinary:       Neg aside from HPI   Musculoskeletal:       Per HPI, otherwise negative  Skin: Negative.   Neurological: Negative for syncope.    Allergies  Review of patient's allergies indicates no known allergies.  Home Medications   Current Outpatient Rx  Name  Route  Sig  Dispense  Refill  . acetaminophen (TYLENOL) 325 MG tablet   Oral   Take 650 mg by mouth every 6 (six) hours as needed for pain.         Marland Kitchen antiseptic oral rinse (BIOTENE) LIQD   Mouth Rinse   15 mLs by Mouth Rinse route QID.         Marland Kitchen aspirin 81 MG chewable tablet   Oral   Chew 1 tablet (81 mg total) by mouth daily.         . diclofenac sodium (VOLTAREN) 1 % GEL      Apply to both feet four times daily         .  diltiazem (CARDIZEM) 30 MG tablet   Oral   Take 30 mg by mouth 2 (two) times daily.         Marland Kitchen docusate sodium (COLACE) 100 MG capsule   Oral   Take 100 mg by mouth daily as needed for constipation.         . Fluticasone-Salmeterol (ADVAIR DISKUS) 250-50 MCG/DOSE AEPB   Inhalation   Inhale 1 puff into the lungs every 12 (twelve) hours.         . furosemide (LASIX) 20 MG tablet   Oral   Take 20 mg by mouth daily.         Marland Kitchen gabapentin (NEURONTIN) 100 MG capsule   Oral   Take 1 capsule (100 mg total) by mouth 3 (three) times daily.   90 capsule   0   . glimepiride (AMARYL) 2 MG tablet   Oral   Take 2 mg by mouth 2 (two) times daily with a meal.         . insulin glargine (LANTUS) 100 UNIT/ML injection   Subcutaneous   Inject 32 Units into the skin at bedtime.         Marland Kitchen ipratropium-albuterol (DUONEB) 0.5-2.5 (3) MG/3ML SOLN   Nebulization   Take 3 mLs by nebulization 3 (three) times daily.         . isosorbide mononitrate (IMDUR) 30 MG 24 hr tablet   Oral    Take 1 tablet (30 mg total) by mouth daily.         Marland Kitchen lidocaine (LIDODERM) 5 %   Transdermal   Place 2 patches onto the skin daily. Apply to right shoulder.  Remove & Discard patch within 12 hours or as directed by MD         . losartan (COZAAR) 50 MG tablet   Oral   Take 50 mg by mouth daily.         . metFORMIN (GLUCOPHAGE) 500 MG tablet      Take 2 tabs by mouth twice daily         . pantoprazole (PROTONIX) 40 MG tablet   Oral   Take 40 mg by mouth daily.         Marland Kitchen rOPINIRole (REQUIP) 1 MG tablet   Oral   Take 1 mg by mouth at bedtime.         . sertraline (ZOLOFT) 50 MG tablet   Oral   Take 50 mg by mouth daily.         Marland Kitchen zolpidem (AMBIEN) 5 MG tablet   Oral   Take 5 mg by mouth at bedtime as needed for sleep.           BP 119/57  Pulse 88  Temp(Src) 97.9 F (36.6 C) (Oral)  Resp 20  SpO2 93%  Physical Exam  Nursing note and vitals reviewed. Constitutional: She is oriented to person, place, and time. She appears well-developed and well-nourished. No distress.  HENT:  Head: Normocephalic and atraumatic.  Eyes: Conjunctivae and EOM are normal.  Cardiovascular: Normal rate and regular rhythm.   Pulmonary/Chest: Effort normal and breath sounds normal. No stridor. No respiratory distress.  Abdominal: She exhibits no distension.    Musculoskeletal: She exhibits no edema.  Neurological: She is alert and oriented to person, place, and time. No cranial nerve deficit.  Skin: Skin is warm and dry.  Psychiatric: She has a normal mood and affect.    ED Course  Procedures (including critical care time)  Labs Reviewed  CBC WITH DIFFERENTIAL  BASIC METABOLIC PANEL  LACTIC ACID, PLASMA   No results found.   No diagnosis found.    MDM  This patient, now recovered from a critical illness presents with concerns of drainage from a recently removed PEG tube site.  On exam the patient is afebrile, in no distress.  There is minimal surrounding  erythema, and a soft, largely nontender abdomen.  Labs do not demonstrate significant evidence of infection, and there is no evidence of pneumoperitoneum.  The patient was provided dressing changes, instructions on wound care, discharged with PMD followup.  Gerhard Munch, MD 05/03/12 1451

## 2012-05-03 NOTE — ED Notes (Addendum)
Pt states she had feeding tube removed yesterday and "it has done nothing but ooze and it stinks."  Denies fever/chills, n/v/d. Wound is open, has a moderate amt of yellow drainage.

## 2012-05-08 ENCOUNTER — Telehealth: Payer: Self-pay | Admitting: Pulmonary Disease

## 2012-05-08 NOTE — Telephone Encounter (Signed)
Spoke with Arline Asp, nurse with [facility] She states that the pt is c/o diarrhea ever since hospital d/c She states that she was taking lomotil and this only helped for 1 day I advised that she needs to contact her PCP, but the pt does not have one, only an NP and they can not take orders from NPs She states that she just needs VO to check stool sample for c diff  Please advise thanks!

## 2012-05-08 NOTE — Telephone Encounter (Signed)
I spoke with [name] and she stated she found pt pcp and got all orders from him. She needed nothing further from [place]. Nothing further was needed

## 2012-05-08 NOTE — Telephone Encounter (Signed)
OK to check c diff

## 2012-05-15 ENCOUNTER — Encounter: Payer: Self-pay | Admitting: Pulmonary Disease

## 2012-05-15 ENCOUNTER — Ambulatory Visit (INDEPENDENT_AMBULATORY_CARE_PROVIDER_SITE_OTHER): Payer: Medicare Other | Admitting: Pulmonary Disease

## 2012-05-15 VITALS — BP 128/66 | HR 90 | Temp 97.9°F | Ht 66.0 in | Wt 213.8 lb

## 2012-05-15 DIAGNOSIS — R079 Chest pain, unspecified: Secondary | ICD-10-CM | POA: Insufficient documentation

## 2012-05-15 NOTE — Assessment & Plan Note (Addendum)
Blood work -CBC, CMET, trop Breathing test in the future, Likely also needs sleep study int he future Stay on advair twice daily Dounebs 4 times/daily Pulmonary rehab when you are fully recovered Complete Rx for C. Diff infection

## 2012-05-15 NOTE — Assessment & Plan Note (Signed)
Spirometry & pul rehab in future

## 2012-05-15 NOTE — Assessment & Plan Note (Signed)
EKG is unchanged from before - RBBB chk trops

## 2012-05-15 NOTE — Patient Instructions (Addendum)
EKG is unchanged from before Blood work -CBC, CMET, trop Breathing test in the future Stay on advair twice daily Dounebs 4 times/daily Pulmonary rehab when you are fully recovered Complete Rx for C. Diff infection

## 2012-05-15 NOTE — Progress Notes (Signed)
  Subjective:    Patient ID: [name], female    DOB: 03-22-1955, 57 y.o.   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 She reports sporadic compliance with this.  She has cut down form 1.5 to 1 PPD. She is unable to afford medciations bt has been getting a supply form her mother of symbicort & singulair. She denies heartburn, sinus drainage, seasonal allergies or excessive daytime somnolence. She wants a letter for disability.  >>smoking cessation, cont O2     05/15/2012  86% 3L on arrival-- pt reports "good days and bad days" with breathing-- trach removed x1 month ago, G Tube removed x2 wks - discharge stopped c/o diarrhea  since hospital d/c - C diff POS  -PCP treating with flagyl & ? Po vanc C/o chest pain x 5 mins -EKG -unchanged RBBB  Admitted 02/29/12 w/ H1N1 w/ ARDS complicated by secondary pneumococcal PNA . She had a prolonged course w/ tracheostomy . 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 Requried transfer to [facility] for weaning. Was sucessfully liberated from vent and decannulated 04/14/12.  Discharged on prednisone taper.  And Advair and As needed Ventolin.  Has been recommended for OP sleep evaluation when ready. (once trach site heals)  Since discharge she is feeling very weak.  Wearing O2 at 3L/m  Trach site is doing well , no redness, no drainage. No fever.  Has not restarted smoking -  Family smokes, but outside.   G-Tube (placed IR 1/9 )  removed.  currenlty on reg diet. No trouble w/ swallowing.  Good appetite and no n/v/d .  Lost 30lbs during  admission.  Has diffused muscle weakness.  On PT at home.   Review of Systems neg for any significant sore throat, dysphagia, itching, sneezing, nasal congestion or excess/ purulent secretions, fever, chills, sweats, unintended wt loss, pleuritic or exertional cp, hempoptysis, orthopnea pnd or change in chronic leg swelling. Also denies presyncope, palpitations, heartburn, abdominal pain, nausea, vomiting, diarrhea or change in bowel or urinary habits, dysuria,hematuria, rash, arthralgias, visual complaints, headache, numbness weakness or ataxia.     Objective:   Physical Exam   Gen. Pleasant, obese, in no distress, normal affect ENT - no lesions, no post nasal drip, class 2-3 airway Neck: No JVD, no thyromegaly, no carotid bruits Lungs: no use of accessory muscles, no dullness to percussion, decreased without rales or rhonchi  Cardiovascular: Rhythm regular, heart sounds  normal, no murmurs or gallops, no peripheral edema Abdomen: soft and non-tender, no hepatosplenomegaly, BS normal. Musculoskeletal: No deformities, no cyanosis or clubbing Neuro:  alert, non focal, no tremors        Assessment & Plan:

## 2012-05-19 ENCOUNTER — Telehealth: Payer: Self-pay | Admitting: Pulmonary Disease

## 2012-05-19 NOTE — Telephone Encounter (Signed)
She did not get her labs drawn on day of visit - can she get done with PCP? Or come back to [place] to get drawn - hope her chest pain has not recurred

## 2012-05-21 ENCOUNTER — Telehealth: Payer: Self-pay | Admitting: Pulmonary Disease

## 2012-05-21 NOTE — Telephone Encounter (Signed)
I spoke with Revonda Standard. She stated it is hard to get pt to do exercised when she is visiting pt. Her sats are 88-90% 3L. I advised her when pt was here last her sats were low when we brought her back. She is wanting to know when she visits pt and her SATS are already low does Dr. Vassie Loll want her to hold off on exercises for that day? Please advise Dr. Vassie Loll thanks

## 2012-05-21 NOTE — Telephone Encounter (Signed)
lmomtcb x1 

## 2012-05-21 NOTE — Telephone Encounter (Signed)
No - satn goal is 88% Can increase O2 upto 6L/min during exercise

## 2012-05-22 NOTE — Telephone Encounter (Signed)
I spoke with pt and she stated she has a doc appt tomorrow and will come and have them done tomorrow.

## 2012-05-22 NOTE — Telephone Encounter (Signed)
Spoke with Revonda Standard. She is aware of orders per Dr. Vassie Loll Order has been faxed to [facility] @ [phone] Nothing further needed at this time.

## 2012-05-27 ENCOUNTER — Telehealth: Payer: Self-pay | Admitting: Pulmonary Disease

## 2012-05-27 NOTE — Telephone Encounter (Signed)
Patient has been discharged from their services per [facility]. Patients goals have been met from a nursing standpoint --pt is nowindependent with medications and can make it to MD appts on her own. Will forward to Dr. Vassie Loll as Lorain Childes

## 2012-05-27 NOTE — Telephone Encounter (Signed)
Called by patient via answering service with report that she had inadvertently taken 32 units of her Novolog rather than Lantus this evening. Her FSBG was 157 just prior to taking the insulin (15 mins prior to the call - approx 9 PM). She is presently asymptomatic. Of note, she also took 1000 mg Metformin.   I recommended that she eat a peanut butter sandwich now and have some juice on hand She is to recheck her FSBG hourly for the next 4-6 hrs  Her husband is at home with her and is to remain with her for the next several hours I will contact her again @ 11 PM and she is to call sooner for any symptoms of hypoglycemia which I described to her  Her primary MD is Dr [name] and Dr Vassie Loll is her pulmonologist   Billy Fischer, MD ; [facility] service Mobile [phone].  After 5:30 PM or weekends, call [phone]

## 2012-05-28 NOTE — Telephone Encounter (Signed)
lmtcb x1 for pt. 

## 2012-05-28 NOTE — Telephone Encounter (Signed)
LMTCB for pt 

## 2012-05-28 NOTE — Telephone Encounter (Signed)
Pt returned call.  Holly D Pryor ° °

## 2012-05-28 NOTE — Telephone Encounter (Signed)
Pl call this pt to make sure CBGs stayed ok Pl ask her to contact PCP

## 2012-05-29 ENCOUNTER — Other Ambulatory Visit (INDEPENDENT_AMBULATORY_CARE_PROVIDER_SITE_OTHER): Payer: Medicare Other

## 2012-05-29 DIAGNOSIS — R079 Chest pain, unspecified: Secondary | ICD-10-CM

## 2012-05-29 LAB — COMPREHENSIVE METABOLIC PANEL WITH GFR
ALT: 14 U/L (ref 0–35)
AST: 23 U/L (ref 0–37)
Albumin: 3.9 g/dL (ref 3.5–5.2)
Alkaline Phosphatase: 38 U/L — ABNORMAL LOW (ref 39–117)
BUN: 9 mg/dL (ref 6–23)
CO2: 36 meq/L — ABNORMAL HIGH (ref 19–32)
Calcium: 8.6 mg/dL (ref 8.4–10.5)
Chloride: 92 meq/L — ABNORMAL LOW (ref 96–112)
Creatinine, Ser: 0.9 mg/dL (ref 0.4–1.2)
GFR: 69.27 mL/min
Glucose, Bld: 204 mg/dL — ABNORMAL HIGH (ref 70–99)
Potassium: 3.6 meq/L (ref 3.5–5.1)
Sodium: 138 meq/L (ref 135–145)
Total Bilirubin: 0.7 mg/dL (ref 0.3–1.2)
Total Protein: 7.3 g/dL (ref 6.0–8.3)

## 2012-05-29 NOTE — Telephone Encounter (Signed)
Spoke with pt and verified that she didn't need anything further from [place] at this time.  Also clarified her future appt dates and time.

## 2012-06-10 ENCOUNTER — Ambulatory Visit (INDEPENDENT_AMBULATORY_CARE_PROVIDER_SITE_OTHER): Payer: Medicare Other | Admitting: Pulmonary Disease

## 2012-06-10 DIAGNOSIS — J449 Chronic obstructive pulmonary disease, unspecified: Secondary | ICD-10-CM

## 2012-06-10 LAB — PULMONARY FUNCTION TEST

## 2012-06-10 NOTE — Progress Notes (Signed)
PFT done today. 

## 2012-06-17 ENCOUNTER — Telehealth: Payer: Self-pay | Admitting: Pulmonary Disease

## 2012-06-17 NOTE — Telephone Encounter (Signed)
No intervention for these labs Will defer to PCP since they are managing this Diltiazem can cause leg swelling -metformin  Or imdur usually does not PL ensure FU appt with TP

## 2012-06-17 NOTE — Telephone Encounter (Signed)
Pt aware. Nothing further was needed 

## 2012-06-17 NOTE — Telephone Encounter (Signed)
Called, spoke with pt.  States her PCP, [name], ordered more labs. These were done on Tuesday, April 8 Pt reports CO2 = 39 and Glucose = 159.   Pt states she was advised by office that she should let RA know about this. Pt states she wasn't fasting or hadn't taken any meds when labs were done. States [name] d/c'd metformin hoping this would help swelling in pt's feet.  Pt reports her feet are "still really, really swollen."  Can only sit in recliner all day because of this. Reports she does have a cough - prod at times but very difficult to cough up mucus.  Mucus is yellow.  Does have wheezing.  Reports this is at baseline and believes it is R/t allergies. Denies increased SOB, chest tightness, or chest pain.   She is requesting further recs from RA and would like to know if diltiazem or imdur can be changed as has read swelling can be a SE of these meds. Dr. Vassie Loll, pls advise.  Thank you.  ** I tried to call Emmie Niemann office for lab results but office is closed this week.  They will reopen next week.

## 2012-06-17 NOTE — Telephone Encounter (Signed)
Pt called back. Wanted to make sure nurse didn't need anything further as pt's phone accidentally disconnected. [name]

## 2012-06-23 ENCOUNTER — Telehealth: Payer: Self-pay | Admitting: Pulmonary Disease

## 2012-06-23 DIAGNOSIS — J449 Chronic obstructive pulmonary disease, unspecified: Secondary | ICD-10-CM

## 2012-06-23 NOTE — Telephone Encounter (Signed)
fev1 41% -POS  BD response OK to proceed with pulm rehab

## 2012-06-24 NOTE — Telephone Encounter (Signed)
Order placed to PCC's.  

## 2012-06-24 NOTE — Telephone Encounter (Signed)
I spoke with patient about results and she verbalized understanding and had no questions 

## 2012-06-30 ENCOUNTER — Telehealth (HOSPITAL_COMMUNITY): Payer: Self-pay | Admitting: *Deleted

## 2012-06-30 NOTE — Telephone Encounter (Signed)
Ms. [name] has been contacted regarding Pulmonary Rehab.  She has only basic Medicare and will need assistance with the additional 20%.  We have given verbal instructions and written instructions mailed to apply for financial assistance.   Cathie Olden RN

## 2012-07-09 ENCOUNTER — Ambulatory Visit: Payer: Medicare Other | Admitting: Adult Health

## 2012-07-15 ENCOUNTER — Ambulatory Visit: Payer: Medicare Other | Admitting: Adult Health

## 2012-07-17 ENCOUNTER — Ambulatory Visit: Payer: Medicare Other | Admitting: Adult Health

## 2012-09-02 DIAGNOSIS — A0472 Enterocolitis due to Clostridium difficile, not specified as recurrent: Secondary | ICD-10-CM

## 2012-09-02 HISTORY — DX: Enterocolitis due to Clostridium difficile, not specified as recurrent: A04.72

## 2012-09-09 ENCOUNTER — Emergency Department (HOSPITAL_COMMUNITY): Payer: Medicare Other

## 2012-09-09 ENCOUNTER — Encounter (HOSPITAL_COMMUNITY): Payer: Self-pay | Admitting: Emergency Medicine

## 2012-09-09 ENCOUNTER — Inpatient Hospital Stay (HOSPITAL_COMMUNITY)
Admission: EM | Admit: 2012-09-09 | Discharge: 2012-09-16 | DRG: 871 | Disposition: A | Discharge status: Home Health | Payer: Medicare Other | Attending: Internal Medicine | Admitting: Internal Medicine

## 2012-09-09 DIAGNOSIS — E1169 Type 2 diabetes mellitus with other specified complication: Secondary | ICD-10-CM

## 2012-09-09 DIAGNOSIS — E669 Obesity, unspecified: Secondary | ICD-10-CM | POA: Diagnosis present

## 2012-09-09 DIAGNOSIS — N179 Acute kidney failure, unspecified: Secondary | ICD-10-CM | POA: Diagnosis present

## 2012-09-09 DIAGNOSIS — E119 Type 2 diabetes mellitus without complications: Secondary | ICD-10-CM

## 2012-09-09 DIAGNOSIS — N170 Acute kidney failure with tubular necrosis: Secondary | ICD-10-CM | POA: Diagnosis present

## 2012-09-09 DIAGNOSIS — E872 Acidosis, unspecified: Secondary | ICD-10-CM | POA: Diagnosis present

## 2012-09-09 DIAGNOSIS — E861 Hypovolemia: Secondary | ICD-10-CM | POA: Insufficient documentation

## 2012-09-09 DIAGNOSIS — G2581 Restless legs syndrome: Secondary | ICD-10-CM | POA: Diagnosis present

## 2012-09-09 DIAGNOSIS — G894 Chronic pain syndrome: Secondary | ICD-10-CM | POA: Diagnosis present

## 2012-09-09 DIAGNOSIS — Z79899 Other long term (current) drug therapy: Secondary | ICD-10-CM

## 2012-09-09 DIAGNOSIS — I252 Old myocardial infarction: Secondary | ICD-10-CM

## 2012-09-09 DIAGNOSIS — Z7982 Long term (current) use of aspirin: Secondary | ICD-10-CM

## 2012-09-09 DIAGNOSIS — Z9981 Dependence on supplemental oxygen: Secondary | ICD-10-CM

## 2012-09-09 DIAGNOSIS — Z418 Encounter for other procedures for purposes other than remedying health state: Secondary | ICD-10-CM

## 2012-09-09 DIAGNOSIS — G4700 Insomnia, unspecified: Secondary | ICD-10-CM | POA: Diagnosis present

## 2012-09-09 DIAGNOSIS — Z9089 Acquired absence of other organs: Secondary | ICD-10-CM

## 2012-09-09 DIAGNOSIS — A0472 Enterocolitis due to Clostridium difficile, not specified as recurrent: Secondary | ICD-10-CM | POA: Diagnosis present

## 2012-09-09 DIAGNOSIS — E871 Hypo-osmolality and hyponatremia: Secondary | ICD-10-CM | POA: Diagnosis not present

## 2012-09-09 DIAGNOSIS — E66811 Obesity, class 1: Secondary | ICD-10-CM | POA: Diagnosis present

## 2012-09-09 DIAGNOSIS — E1142 Type 2 diabetes mellitus with diabetic polyneuropathy: Secondary | ICD-10-CM | POA: Diagnosis present

## 2012-09-09 DIAGNOSIS — E1165 Type 2 diabetes mellitus with hyperglycemia: Secondary | ICD-10-CM | POA: Diagnosis present

## 2012-09-09 DIAGNOSIS — R6521 Severe sepsis with septic shock: Secondary | ICD-10-CM | POA: Diagnosis present

## 2012-09-09 DIAGNOSIS — F329 Major depressive disorder, single episode, unspecified: Secondary | ICD-10-CM | POA: Diagnosis present

## 2012-09-09 DIAGNOSIS — I2510 Atherosclerotic heart disease of native coronary artery without angina pectoris: Secondary | ICD-10-CM | POA: Diagnosis present

## 2012-09-09 DIAGNOSIS — I959 Hypotension, unspecified: Secondary | ICD-10-CM

## 2012-09-09 DIAGNOSIS — E869 Volume depletion, unspecified: Secondary | ICD-10-CM | POA: Diagnosis present

## 2012-09-09 DIAGNOSIS — A414 Sepsis due to anaerobes: Principal | ICD-10-CM | POA: Diagnosis present

## 2012-09-09 DIAGNOSIS — G8929 Other chronic pain: Secondary | ICD-10-CM | POA: Diagnosis present

## 2012-09-09 DIAGNOSIS — J9620 Acute and chronic respiratory failure, unspecified whether with hypoxia or hypercapnia: Secondary | ICD-10-CM

## 2012-09-09 DIAGNOSIS — R197 Diarrhea, unspecified: Secondary | ICD-10-CM

## 2012-09-09 DIAGNOSIS — J449 Chronic obstructive pulmonary disease, unspecified: Secondary | ICD-10-CM | POA: Diagnosis present

## 2012-09-09 DIAGNOSIS — K219 Gastro-esophageal reflux disease without esophagitis: Secondary | ICD-10-CM

## 2012-09-09 DIAGNOSIS — Z2989 Encounter for other specified prophylactic measures: Secondary | ICD-10-CM

## 2012-09-09 DIAGNOSIS — J9601 Acute respiratory failure with hypoxia: Secondary | ICD-10-CM | POA: Diagnosis present

## 2012-09-09 DIAGNOSIS — F3289 Other specified depressive episodes: Secondary | ICD-10-CM | POA: Diagnosis present

## 2012-09-09 DIAGNOSIS — E1149 Type 2 diabetes mellitus with other diabetic neurological complication: Secondary | ICD-10-CM | POA: Diagnosis present

## 2012-09-09 DIAGNOSIS — R0689 Other abnormalities of breathing: Secondary | ICD-10-CM | POA: Diagnosis present

## 2012-09-09 DIAGNOSIS — A419 Sepsis, unspecified organism: Secondary | ICD-10-CM | POA: Diagnosis present

## 2012-09-09 DIAGNOSIS — J4489 Other specified chronic obstructive pulmonary disease: Secondary | ICD-10-CM | POA: Diagnosis present

## 2012-09-09 DIAGNOSIS — R6520 Severe sepsis without septic shock: Secondary | ICD-10-CM | POA: Diagnosis present

## 2012-09-09 DIAGNOSIS — E785 Hyperlipidemia, unspecified: Secondary | ICD-10-CM | POA: Diagnosis present

## 2012-09-09 DIAGNOSIS — I10 Essential (primary) hypertension: Secondary | ICD-10-CM | POA: Diagnosis present

## 2012-09-09 DIAGNOSIS — Y95 Nosocomial condition: Secondary | ICD-10-CM

## 2012-09-09 DIAGNOSIS — D638 Anemia in other chronic diseases classified elsewhere: Secondary | ICD-10-CM | POA: Diagnosis present

## 2012-09-09 DIAGNOSIS — Z794 Long term (current) use of insulin: Secondary | ICD-10-CM

## 2012-09-09 DIAGNOSIS — M129 Arthropathy, unspecified: Secondary | ICD-10-CM | POA: Diagnosis present

## 2012-09-09 DIAGNOSIS — J189 Pneumonia, unspecified organism: Secondary | ICD-10-CM | POA: Diagnosis not present

## 2012-09-09 DIAGNOSIS — Z87891 Personal history of nicotine dependence: Secondary | ICD-10-CM

## 2012-09-09 DIAGNOSIS — D649 Anemia, unspecified: Secondary | ICD-10-CM | POA: Diagnosis present

## 2012-09-09 DIAGNOSIS — G9341 Metabolic encephalopathy: Secondary | ICD-10-CM | POA: Diagnosis present

## 2012-09-09 DIAGNOSIS — A0471 Enterocolitis due to Clostridium difficile, recurrent: Secondary | ICD-10-CM | POA: Diagnosis present

## 2012-09-09 LAB — CBC WITH DIFFERENTIAL/PLATELET
Basophils Absolute: 0 10*3/uL (ref 0.0–0.1)
Basophils Relative: 0 % (ref 0–1)
Eosinophils Absolute: 0 10*3/uL (ref 0.0–0.7)
Eosinophils Relative: 0 % (ref 0–5)
HCT: 35.6 % — ABNORMAL LOW (ref 36.0–46.0)
Hemoglobin: 11.4 g/dL — ABNORMAL LOW (ref 12.0–15.0)
Lymphocytes Relative: 4 % — ABNORMAL LOW (ref 12–46)
Lymphs Abs: 0.7 10*3/uL (ref 0.7–4.0)
MCH: 26.9 pg (ref 26.0–34.0)
MCHC: 32 g/dL (ref 30.0–36.0)
MCV: 84 fL (ref 78.0–100.0)
Monocytes Absolute: 0.9 10*3/uL (ref 0.1–1.0)
Monocytes Relative: 6 % (ref 3–12)
Neutro Abs: 14.4 10*3/uL — ABNORMAL HIGH (ref 1.7–7.7)
Neutrophils Relative %: 90 % — ABNORMAL HIGH (ref 43–77)
Platelets: 264 10*3/uL (ref 150–400)
RBC: 4.24 MIL/uL (ref 3.87–5.11)
RDW: 15.7 % — ABNORMAL HIGH (ref 11.5–15.5)
WBC: 16 10*3/uL — ABNORMAL HIGH (ref 4.0–10.5)

## 2012-09-09 LAB — COMPREHENSIVE METABOLIC PANEL
ALT: 7 U/L (ref 0–35)
AST: 15 U/L (ref 0–37)
Albumin: 1.9 g/dL — ABNORMAL LOW (ref 3.5–5.2)
Alkaline Phosphatase: 86 U/L (ref 39–117)
BUN: 63 mg/dL — ABNORMAL HIGH (ref 6–23)
CO2: 26 mEq/L (ref 19–32)
Calcium: 8.2 mg/dL — ABNORMAL LOW (ref 8.4–10.5)
Chloride: 88 mEq/L — ABNORMAL LOW (ref 96–112)
Creatinine, Ser: 2.56 mg/dL — ABNORMAL HIGH (ref 0.50–1.10)
GFR calc Af Amer: 23 mL/min — ABNORMAL LOW (ref >90)
GFR calc non Af Amer: 20 mL/min — ABNORMAL LOW (ref >90)
Glucose, Bld: 144 mg/dL — ABNORMAL HIGH (ref 70–99)
Potassium: 3.6 mEq/L (ref 3.5–5.1)
Sodium: 128 mEq/L — ABNORMAL LOW (ref 135–145)
Total Bilirubin: 0.1 mg/dL — ABNORMAL LOW (ref 0.3–1.2)
Total Protein: 5.2 g/dL — ABNORMAL LOW (ref 6.0–8.3)

## 2012-09-09 LAB — CBC
HCT: 34.4 % — ABNORMAL LOW (ref 36.0–46.0)
Hemoglobin: 11.2 g/dL — ABNORMAL LOW (ref 12.0–15.0)
MCH: 27.5 pg (ref 26.0–34.0)
MCV: 84.3 fL (ref 78.0–100.0)
RBC: 4.08 MIL/uL (ref 3.87–5.11)
WBC: 14.6 10*3/uL — ABNORMAL HIGH (ref 4.0–10.5)

## 2012-09-09 LAB — CREATININE, SERUM: GFR calc Af Amer: 26 mL/min — ABNORMAL LOW (ref >90)

## 2012-09-09 MED ORDER — INSULIN ASPART 100 UNIT/ML ~~LOC~~ SOLN
0.0000 [IU] | Freq: Three times a day (TID) | SUBCUTANEOUS | Status: DC
  Administered 2012-09-10: 2 [IU] via SUBCUTANEOUS

## 2012-09-09 MED ORDER — INSULIN GLARGINE 100 UNIT/ML ~~LOC~~ SOLN
16.0000 [IU] | Freq: Every day | SUBCUTANEOUS | Status: DC
  Filled 2012-09-09 (×3): qty 0.16

## 2012-09-09 MED ORDER — ISOSORBIDE MONONITRATE ER 30 MG PO TB24
30.0000 mg | ORAL_TABLET | Freq: Every day | ORAL | Status: DC
Start: 2012-09-10 — End: 2012-09-10
  Filled 2012-09-09: qty 1

## 2012-09-09 MED ORDER — SODIUM CHLORIDE 0.9 % IJ SOLN
3.0000 mL | Freq: Two times a day (BID) | INTRAMUSCULAR | Status: DC
  Administered 2012-09-09 – 2012-09-11 (×5): 3 mL via INTRAVENOUS

## 2012-09-09 MED ORDER — KCL IN DEXTROSE-NACL 40-5-0.9 MEQ/L-%-% IV SOLN
INTRAVENOUS | Status: DC
  Administered 2012-09-09 – 2012-09-10 (×2): via INTRAVENOUS
  Filled 2012-09-09 (×5): qty 1000

## 2012-09-09 MED ORDER — SODIUM CHLORIDE 0.9 % IV BOLUS (SEPSIS)
2000.0000 mL | Freq: Once | INTRAVENOUS | Status: AC
  Administered 2012-09-09: 2000 mL via INTRAVENOUS

## 2012-09-09 MED ORDER — HYDROCODONE-ACETAMINOPHEN 7.5-325 MG PO TABS: 1.0000 | ORAL_TABLET | ORAL | Status: DC | PRN

## 2012-09-09 MED ORDER — ALBUTEROL SULFATE (5 MG/ML) 0.5% IN NEBU
2.5000 mg | INHALATION_SOLUTION | Freq: Three times a day (TID) | RESPIRATORY_TRACT | Status: DC
  Administered 2012-09-09 – 2012-09-10 (×2): 2.5 mg via RESPIRATORY_TRACT
  Filled 2012-09-09 (×2): qty 0.5

## 2012-09-09 MED ORDER — GABAPENTIN 100 MG PO CAPS
100.0000 mg | ORAL_CAPSULE | Freq: Three times a day (TID) | ORAL | Status: DC
  Administered 2012-09-10: 100 mg via ORAL
  Filled 2012-09-09 (×4): qty 1

## 2012-09-09 MED ORDER — INSULIN ASPART 100 UNIT/ML ~~LOC~~ SOLN: 0.0000 [IU] | Freq: Every day | SUBCUTANEOUS | Status: DC

## 2012-09-09 MED ORDER — SODIUM CHLORIDE 0.9 % IV BOLUS (SEPSIS)
1000.0000 mL | Freq: Once | INTRAVENOUS | Status: AC
  Administered 2012-09-09: 1000 mL via INTRAVENOUS

## 2012-09-09 MED ORDER — VANCOMYCIN 50 MG/ML ORAL SOLUTION
125.0000 mg | Freq: Four times a day (QID) | ORAL | Status: DC
  Administered 2012-09-09: 125 mg via ORAL
  Filled 2012-09-09 (×5): qty 2.5

## 2012-09-09 MED ORDER — IPRATROPIUM-ALBUTEROL 0.5-2.5 (3) MG/3ML IN SOLN: 3.0000 mL | Freq: Three times a day (TID) | RESPIRATORY_TRACT | Status: DC

## 2012-09-09 MED ORDER — ASPIRIN 81 MG PO CHEW: 81.0000 mg | CHEWABLE_TABLET | Freq: Every day | ORAL | Status: DC

## 2012-09-09 MED ORDER — ROPINIROLE HCL 1 MG PO TABS
1.0000 mg | ORAL_TABLET | Freq: Every day | ORAL | Status: DC
  Administered 2012-09-10: 1 mg via ORAL
  Filled 2012-09-09 (×2): qty 1

## 2012-09-09 MED ORDER — BIOTENE DRY MOUTH MT LIQD
15.0000 mL | Freq: Four times a day (QID) | OROMUCOSAL | Status: DC
  Administered 2012-09-10 (×3): 15 mL via OROMUCOSAL

## 2012-09-09 MED ORDER — SACCHAROMYCES BOULARDII 250 MG PO CAPS
250.0000 mg | ORAL_CAPSULE | Freq: Two times a day (BID) | ORAL | Status: DC
  Administered 2012-09-10: 250 mg via ORAL
  Filled 2012-09-09 (×3): qty 1

## 2012-09-09 MED ORDER — SERTRALINE HCL 50 MG PO TABS
50.0000 mg | ORAL_TABLET | Freq: Every day | ORAL | Status: DC
Start: 2012-09-10 — End: 2012-09-10
  Filled 2012-09-09: qty 1

## 2012-09-09 MED ORDER — ACETAMINOPHEN 325 MG PO TABS: 650.0000 mg | ORAL_TABLET | ORAL | Status: DC | PRN

## 2012-09-09 MED ORDER — ONDANSETRON HCL 4 MG/2ML IJ SOLN: 4.0000 mg | Freq: Four times a day (QID) | INTRAMUSCULAR | Status: DC | PRN

## 2012-09-09 MED ORDER — PANTOPRAZOLE SODIUM 40 MG PO TBEC
40.0000 mg | DELAYED_RELEASE_TABLET | Freq: Every day | ORAL | Status: DC
Start: 2012-09-10 — End: 2012-09-10

## 2012-09-09 MED ORDER — FENTANYL CITRATE 0.05 MG/ML IJ SOLN
50.0000 ug | Freq: Once | INTRAMUSCULAR | Status: AC
  Administered 2012-09-09: 50 ug via INTRAVENOUS
  Filled 2012-09-09: qty 2

## 2012-09-09 MED ORDER — HEPARIN SODIUM (PORCINE) 5000 UNIT/ML IJ SOLN
5000.0000 [IU] | Freq: Three times a day (TID) | INTRAMUSCULAR | Status: DC
  Administered 2012-09-10 – 2012-09-16 (×20): 5000 [IU] via SUBCUTANEOUS
  Filled 2012-09-09 (×25): qty 1

## 2012-09-09 MED ORDER — IPRATROPIUM BROMIDE 0.02 % IN SOLN
0.5000 mg | Freq: Three times a day (TID) | RESPIRATORY_TRACT | Status: DC
  Administered 2012-09-09 – 2012-09-10 (×2): 0.5 mg via RESPIRATORY_TRACT
  Filled 2012-09-09 (×2): qty 2.5

## 2012-09-09 MED ORDER — ONDANSETRON HCL 4 MG PO TABS: 4.0000 mg | ORAL_TABLET | Freq: Four times a day (QID) | ORAL | Status: DC | PRN

## 2012-09-09 MED ORDER — METRONIDAZOLE IN NACL 5-0.79 MG/ML-% IV SOLN
500.0000 mg | Freq: Three times a day (TID) | INTRAVENOUS | Status: DC
  Administered 2012-09-10 (×2): 500 mg via INTRAVENOUS
  Filled 2012-09-09 (×4): qty 100

## 2012-09-09 NOTE — ED Provider Notes (Signed)
310 pmPresents with diarrhea multiple episodes of onset January 2014. Had more than 10 episodes of watery diarrhea today. Also complains of cough for several days. She presently states she is hungry. Unknown if he has a fever. On exam patient is alert ,ill-appearing, hypotensive. Mucous membranes dry. Lungs clear auscultation abdomen obese, nontender   5:20 PM patient continues to complain of diffuse abdominal discomfort. She remains alert appropriate Glasgow Coma Score 15. Remained hypotensive. Additional intravenous fluids ordered.  6:30 PM patient feels improved after treatment with additional intravenous fluids and fentanyl IV. She is sleepy easily arousable. Answers appropriately. Patient to be admitted to intensive care unit due to hypotension and renal insufficiency felt secondary to extreme dehydration. Old hospital records reviewed. Pneed for admission discussed with pt and family Results for orders placed during the hospital encounter of 09/09/12  CBC WITH DIFFERENTIAL      Result Value Range   WBC 16.0 (*) 4.0 - 10.5 K/uL   RBC 4.24  3.87 - 5.11 MIL/uL   Hemoglobin 11.4 (*) 12.0 - 15.0 g/dL   HCT 40.9 (*) 81.1 - 91.4 %   MCV 84.0  78.0 - 100.0 fL   MCH 26.9  26.0 - 34.0 pg   MCHC 32.0  30.0 - 36.0 g/dL   RDW 78.2 (*) 95.6 - 21.3 %   Platelets 264  150 - 400 K/uL   Neutrophils Relative % 90 (*) 43 - 77 %   Neutro Abs 14.4 (*) 1.7 - 7.7 K/uL   Lymphocytes Relative 4 (*) 12 - 46 %   Lymphs Abs 0.7  0.7 - 4.0 K/uL   Monocytes Relative 6  3 - 12 %   Monocytes Absolute 0.9  0.1 - 1.0 K/uL   Eosinophils Relative 0  0 - 5 %   Eosinophils Absolute 0.0  0.0 - 0.7 K/uL   Basophils Relative 0  0 - 1 %   Basophils Absolute 0.0  0.0 - 0.1 K/uL  COMPREHENSIVE METABOLIC PANEL      Result Value Range   Sodium 128 (*) 135 - 145 mEq/L   Potassium 3.6  3.5 - 5.1 mEq/L   Chloride 88 (*) 96 - 112 mEq/L   CO2 26  19 - 32 mEq/L   Glucose, Bld 144 (*) 70 - 99 mg/dL   BUN 63 (*) 6 - 23 mg/dL   Creatinine, Ser 0.86 (*) 0.50 - 1.10 mg/dL   Calcium 8.2 (*) 8.4 - 10.5 mg/dL   Total Protein 5.2 (*) 6.0 - 8.3 g/dL   Albumin 1.9 (*) 3.5 - 5.2 g/dL   AST 15  0 - 37 U/L   ALT 7  0 - 35 U/L   Alkaline Phosphatase 86  39 - 117 U/L   Total Bilirubin 0.1 (*) 0.3 - 1.2 mg/dL   GFR calc non Af Amer 20 (*) >90 mL/min   GFR calc Af Amer 23 (*) >90 mL/min  CG4 I-STAT (LACTIC ACID)      Result Value Range   Lactic Acid, Venous 0.54  0.5 - 2.2 mmol/L   Dg Chest Portable 1 View  09/09/2012   *RADIOLOGY REPORT*  Clinical Data: 57 year old female with respiratory failure. Diarrhea.  PORTABLE CHEST - 1 VIEW  Comparison: 04/08/2012 and earlier.  Findings: Semi upright AP portable view 1750 hours.  Stable lung volumes.  Stable cardiac size and mediastinal contours.  No pneumothorax or pulmonary edema.  No pleural effusion or confluent pulmonary opacity.  Stable mild contour deformity associated with a  small fat containing right diaphragmatic hernia (see 03/24/2012 CTA).  IMPRESSION: No acute cardiopulmonary abnormality.   Original Report Authenticated By: Erskine Speed, M.D.   CRITICAL CARE Performed by: Doug Sou Total critical care time: 30 minute.  Critical care time was exclusive of separately billable procedures and treating other patients. Critical care was necessary to treat or prevent imminent or life-threatening deterioration. Critical care was time spent personally by me on the following activities: development of treatment plan with patient and/or surrogate as well as nursing, discussions with consultants, evaluation of patient's response to treatment, examination of patient, obtaining history from patient or surrogate, ordering and performing treatments and interventions, ordering and review of laboratory studies, ordering and review of radiographic studies, pulse oximetry and re-evaluation of patient's condition.  Doug Sou, MD 09/09/12 [place] Fruit

## 2012-09-09 NOTE — ED Provider Notes (Signed)
History    CSN: [id number] Arrival date & time 09/09/12  1510  First MD Initiated Contact with Patient 09/09/12 1538     Chief Complaint  Patient presents with  . Diarrhea   (Consider location/radiation/quality/duration/timing/severity/associated sxs/prior Treatment) HPI Pt is a 57yo female with hx of DM type 2, C. Diff and hospitalization for same presenting earlier this year, presenting with 26mo hx of watery diarrhea w/o blood or mucous, reports >10 episodes today.  Pt c/o sharp, cramping diffuse abdominal pain, however states that she is hungry.  She has not been able to eat due to diarrhea. Has not tried anything for symptoms.  Fever unknown but reports chills.  Also reports fatigue, dry mouth, mild sore throat, and non-productive cough x2 weeks as well as "walking sideways" Pt states she is on 3L of O2 at home due to COPD.    Past Medical History  Diagnosis Date  . COPD (chronic obstructive pulmonary disease)   . Respiratory failure   . Morbid obesity   . Tobacco abuse   . GERD (gastroesophageal reflux disease)   . Hyperlipidemia   . Chronic pain   . Hypertension   . Heart attack   . Arthritis   . Allergic rhinitis   . Diabetes mellitus, type 2   . Diabetes mellitus type 2 in obese 02/29/2012  . HTN (hypertension) 02/29/2012   Past Surgical History  Procedure Laterality Date  . Cholecystectomy    . Partial hysterectomy    . Angioplasty    . Umbilical hernia repair  12/2009   History reviewed. No pertinent family history. History  Substance Use Topics  . Smoking status: Former Smoker -- 1.00 packs/day for 40 years    Types: Cigarettes    Quit date: 03/04/2012  . Smokeless tobacco: Never Used  . Alcohol Use: No   OB History   Grav Para Term Preterm Abortions TAB SAB Ect Mult Living                 Review of Systems  Constitutional: Positive for chills, appetite change ( decreased) and fatigue.  Respiratory: Positive for cough and shortness of breath.    Cardiovascular: Negative for chest pain.  Gastrointestinal: Positive for abdominal pain and diarrhea. Negative for nausea, vomiting, blood in stool and abdominal distention.  All other systems reviewed and are negative.    Allergies  Other  Home Medications   No current outpatient prescriptions on file. BP 100/38  Pulse 97  Temp(Src) 98.1 F (36.7 C) (Oral)  Resp 20  SpO2 93% Physical Exam  Nursing note and vitals reviewed. Constitutional: She appears well-developed and well-nourished.  Morbidly obese female lying in exam bed with [place] in place, appears fatigued and uncomfortable.    HENT:  Head: Normocephalic and atraumatic.  Mouth/Throat: Mucous membranes are dry.  Eyes: Conjunctivae are normal. No scleral icterus.  Neck: Normal range of motion. Neck supple.  Cardiovascular: Normal rate, regular rhythm and normal heart sounds.   Pulmonary/Chest: Effort normal. No respiratory distress. She has wheezes ( mild, diffuse). She has rales ( midl, diffuse'). She exhibits no tenderness.  No respiratory distress, able to speak in full sentences. Body habitus limits lung exam, difficult to auscultate pt.   Abdominal: Soft. Bowel sounds are normal. She exhibits no distension and no mass. There is no tenderness. There is no rebound and no guarding.  Musculoskeletal: Normal range of motion.  Neurological: She is alert.  Skin: Skin is warm and dry. She is not diaphoretic.  ED Course  Procedures (including critical care time) Labs Reviewed  CLOSTRIDIUM DIFFICILE BY PCR - Abnormal; Notable for the following:    C difficile by pcr POSITIVE (*)    All other components within normal limits  CBC WITH DIFFERENTIAL - Abnormal; Notable for the following:    WBC 16.0 (*)    Hemoglobin 11.4 (*)    HCT 35.6 (*)    RDW 15.7 (*)    Neutrophils Relative % 90 (*)    Neutro Abs 14.4 (*)    Lymphocytes Relative 4 (*)    All other components within normal limits  COMPREHENSIVE METABOLIC PANEL -  Abnormal; Notable for the following:    Sodium 128 (*)    Chloride 88 (*)    Glucose, Bld 144 (*)    BUN 63 (*)    Creatinine, Ser 2.56 (*)    Calcium 8.2 (*)    Total Protein 5.2 (*)    Albumin 1.9 (*)    Total Bilirubin 0.1 (*)    GFR calc non Af Amer 20 (*)    GFR calc Af Amer 23 (*)    All other components within normal limits  CBC - Abnormal; Notable for the following:    WBC 14.6 (*)    Hemoglobin 11.2 (*)    HCT 34.4 (*)    RDW 15.7 (*)    All other components within normal limits  CREATININE, SERUM - Abnormal; Notable for the following:    Creatinine, Ser 2.31 (*)    GFR calc non Af Amer 22 (*)    GFR calc Af Amer 26 (*)    All other components within normal limits  URINE CULTURE  CULTURE, BLOOD (ROUTINE X 2)  CULTURE, BLOOD (ROUTINE X 2)  STOOL CULTURE  CLOSTRIDIUM DIFFICILE BY PCR  MRSA PCR SCREENING  URINALYSIS, ROUTINE W REFLEX MICROSCOPIC  CORTISOL  TSH  CBC  COMPREHENSIVE METABOLIC PANEL  CG4 I-STAT (LACTIC ACID)   Dg Chest Portable 1 View  09/09/2012   *RADIOLOGY REPORT*  Clinical Data: 57 year old female with respiratory failure. Diarrhea.  PORTABLE CHEST - 1 VIEW  Comparison: 04/08/2012 and earlier.  Findings: Semi upright AP portable view 1750 hours.  Stable lung volumes.  Stable cardiac size and mediastinal contours.  No pneumothorax or pulmonary edema.  No pleural effusion or confluent pulmonary opacity.  Stable mild contour deformity associated with a small fat containing right diaphragmatic hernia (see 03/24/2012 CTA).  IMPRESSION: No acute cardiopulmonary abnormality.   Original Report Authenticated By: Erskine Speed, M.D.    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 ARF (acute renal failure)   2. Diarrhea   3.  COPD (chronic obstructive pulmonary disease)   4. Diabetes mellitus type 2 in obese   5. Hypotension     MDM  Pt with hx of positive C. Diff and hospitalization presented with 39mo of profuse diarrhea with increased weakness and abdominal pain.  Vitals: BP 68/38, pulse 94, Resp. 24, O2 93% on 3L.  Will give 2L NS bolus via 2 large bore IVs.  Placed pt on 3L O2, pt is nl on 3L O2 at home.  Labs: CBC and CMP. Due to 2wk hx of cough, CXR.  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    CBC: WBC-16, Hgb/Hct-consistent with pt's baseline  CMP: Na:128, K: 3.6, Cl: 88, BUN: 63. Cr: 2.56 CXR: no acute cardiopulmonary abnormality   Also sent for C. Diff by PCR due to last lab in Jan 2014.   1:24 AM BP is still 76/48, pt c/o diffuse abd pain.  Giving another 1L of NS and starting Fentanyl  1:24 AM Consulted Dr. Jerral Ralph, internal medicine, states pt is too unstable for step-down, will need to consult ICU for admittance of pt.  1:24 AM Spoke with Dr. Delton Coombes who agreed to come see pt.  Pt will get blood and urine cultures.    1:24 AM Was advised by Dr. Herma Carson that he saw pt and consulted Dr. Conley Rolls, hospitalist, who agreed to admit pt.        Junius Finner, PA-C 09/10/12 [phone]

## 2012-09-09 NOTE — ED Notes (Signed)
Pt had large BM prior to being registered.

## 2012-09-09 NOTE — Progress Notes (Signed)
ANTIBIOTIC CONSULT NOTE - INITIAL  Pharmacy Consult for vancomycin PO Indication: r/o Cdiff  Allergies  Allergen Reactions  . Other Swelling and Other (See Comments)    Patient took cardizem and metformin after last hospital stay that caused her to very bad swelling all over body.     Patient Measurements:    Vital Signs: Temp: 98.3 F (36.8 C) (07/08 1533) BP: 73/43 mmHg (07/08 2030) Pulse Rate: 98 (07/08 1930) Intake/Output from previous day:   Intake/Output from this shift:    Labs:  Recent Labs  09/09/12 1610  WBC 16.0*  HGB 11.4*  PLT 264  CREATININE 2.56*   The CrCl is unknown because both a height and weight (above a minimum accepted value) are required for this calculation. No results found for this basename: VANCOTROUGH, VANCOPEAK, VANCORANDOM, GENTTROUGH, GENTPEAK, GENTRANDOM, TOBRATROUGH, TOBRAPEAK, TOBRARND, AMIKACINPEAK, AMIKACINTROU, AMIKACIN,  in the last 72 hours   Microbiology: No results found for this or any previous visit (from the past 720 hour(s)).  Medical History: Past Medical History  Diagnosis Date  . COPD (chronic obstructive pulmonary disease)   . Respiratory failure   . Morbid obesity   . Tobacco abuse   . GERD (gastroesophageal reflux disease)   . Hyperlipidemia   . Chronic pain   . Hypertension   . Heart attack   . Arthritis   . Allergic rhinitis   . Diabetes mellitus, type 2   . Diabetes mellitus type 2 in obese 02/29/2012  . HTN (hypertension) 02/29/2012    Medications:  Anti-infectives   Start     Dose/Rate Route Frequency Ordered Stop   09/09/12 2200  vancomycin (VANCOCIN) 50 mg/mL oral solution 125 mg     125 mg Oral 4 times daily 09/09/12 2119 09/23/12 2159     Assessment: 57 yof presented to the ED with abdominal pain and diarrhea. To start empiric vancomycin PO and flagyl. CDiff PCR is pending.    Goal of Therapy:  Eradication of infection  Plan:  1. Vancomycin PO 125mg  QID x 14 days via Cdiff orderset 2.  F/u Cdiff PCR  Kinslea Frances, Drake Leach 09/09/2012,9:24 PM

## 2012-09-09 NOTE — Procedures (Signed)
Arterial Catheter Insertion Procedure Note [name] [id number] 08-16-1955  Procedure: Insertion of Arterial Catheter  Indications: Blood pressure monitoring  Procedure Details Consent: Risks of procedure as well as the alternatives and risks of each were explained to the (patient/caregiver).  Consent for procedure obtained. Time Out: Verified patient identification, verified procedure, site/side was marked, verified correct patient position, special equipment/implants available, medications/allergies/relevent history reviewed, required imaging and test results available.  Performed  Maximum sterile technique was used including antiseptics, cap, gloves, gown, hand hygiene, mask and sheet. Skin prep: Chlorhexidine; local anesthetic administered 20 gauge catheter was inserted into left radial artery using the Seldinger technique.  Evaluation Blood flow good; BP tracing good. Complications: No apparent complications Done with [name] Day, RRT, RCP.   [name] J, RRT, RCP 09/09/2012

## 2012-09-09 NOTE — ED Notes (Signed)
Respiratory called

## 2012-09-09 NOTE — H&P (Signed)
[facility] History and Physical  [name] OZH:[id number] DOB: 1955-04-08    PCP:   [name], NP   Chief Complaint: weakness and persistent diarrhea.  HPI: [name] is an 57 y.o. female with multiple medical problems including COPD on 3 L [place], hyperlipidemia, respiratory failure recently admitted to PCCM, morbid obesity, HTN, DM2, chronic pain syndrome, brought in by her husband as she has been having persistent foul smelling diarrhea for the past 2 months.  He said she has been having diarrhea since her last hospitalization.  She has not been able to take in adequate PO at home.  She denied any black of bloody stool, but admitted to some fever and abdominal cramps.  Her husband had been urging her to seek help, but she didn't call her PCP until today, whereupon she was recommended to proceed to the ER for evaluation.  In the ER, she was found to be severely hypotensive, with SBP 70's, BUN/Cr 63/2/56, WBC of 16K, and Na of 128.  PCCM was consulted and deferred admission to [facility].  She was given IVF, and an artline was placed.  The resultant BP was 88.  Hospitalist was asked to admit her for volume depletion, AKI, and diarrhea suspicious for C diff infection.  Rewiew of Systems:  Constitutional:  No significant weight loss or weight gain Eyes: Negative for eye pain, redness and discharge, diplopia, visual changes, or flashes of light. ENMT: Negative for ear pain, hoarseness, nasal congestion, sinus pressure and sore throat. No headaches; tinnitus, drooling, or problem swallowing. Cardiovascular: Negative for chest pain, palpitations, diaphoresis, and peripheral edema. ; No orthopnea, PND Respiratory: Negative for cough, hemoptysis, wheezing and stridor. No pleuritic chestpain. Gastrointestinal: Negative for nausea, vomiting, constipation,  melena, blood in stool, hematemesis, jaundice and rectal bleeding.    Genitourinary: Negative for frequency, dysuria, incontinence,flank  pain and hematuria; Musculoskeletal: Negative for back pain and neck pain. Negative for swelling and trauma.;  Skin: . Negative for pruritus, rash, abrasions, bruising and skin lesion.; ulcerations Neuro: Negative for headache, lightheadedness and neck stiffness. Negative for weakness, altered level of consciousness , altered mental status, extremity weakness, burning feet, involuntary movement, seizure and syncope.  Psych: negative for anxiety, depression, insomnia, tearfulness, panic attacks, hallucinations, paranoia, suicidal or homicidal ideation    Past Medical History  Diagnosis Date  . COPD (chronic obstructive pulmonary disease)   . Respiratory failure   . Morbid obesity   . Tobacco abuse   . GERD (gastroesophageal reflux disease)   . Hyperlipidemia   . Chronic pain   . Hypertension   . Heart attack   . Arthritis   . Allergic rhinitis   . Diabetes mellitus, type 2   . Diabetes mellitus type 2 in obese 02/29/2012  . HTN (hypertension) 02/29/2012    Past Surgical History  Procedure Laterality Date  . Cholecystectomy    . Partial hysterectomy    . Angioplasty    . Umbilical hernia repair  12/2009    Medications:  HOME MEDS: Prior to Admission medications   Medication Sig Start Date End Date Taking? Authorizing Provider  acetaminophen (TYLENOL) 325 MG tablet Take 650 mg by mouth every 6 (six) hours as needed for pain.   Yes Historical Provider, MD  antiseptic oral rinse (BIOTENE) LIQD 15 mLs by Mouth Rinse route QID. 03/12/12  Yes Annett Gula, MD  aspirin 81 MG chewable tablet Chew 1 tablet (81 mg total) by mouth daily. 03/12/12  Yes Annett Gula, MD  diclofenac sodium (VOLTAREN) 1 % GEL Apply 4 g topically 2 (two) times daily as needed (for foot pain).    Yes Historical Provider, MD  furosemide (LASIX) 20 MG tablet Take 20 mg by mouth 2 (two) times daily.    Yes Historical Provider, MD  gabapentin (NEURONTIN) 100 MG capsule Take 1 capsule (100 mg total) by mouth 3  (three) times daily. 04/21/12  Yes Tammy S Parrett, NP  glimepiride (AMARYL) 2 MG tablet Take 2 mg by mouth 2 (two) times daily with a meal.   Yes Historical Provider, MD  HYDROcodone-acetaminophen (NORCO) 7.5-325 MG per tablet Take 1 tablet by mouth every 4 (four) hours as needed for pain.   Yes Historical Provider, MD  insulin aspart (NOVOLOG) 100 UNIT/ML injection Inject 0-8 Units into the skin 3 (three) times daily with meals. On sliding scale:  0-100 0 units 101-150 take 3 units 151-200 take 5 units 201>300 take 8 units 400 or higher call MD   Yes Historical Provider, MD  insulin glargine (LANTUS) 100 UNIT/ML injection Inject 32 Units into the skin at bedtime. 03/12/12  Yes Annett Gula, MD  ipratropium-albuterol (DUONEB) 0.5-2.5 (3) MG/3ML SOLN Take 3 mLs by nebulization 3 (three) times daily.   Yes Historical Provider, MD  isosorbide mononitrate (IMDUR) 30 MG 24 hr tablet Take 1 tablet (30 mg total) by mouth daily. 03/12/12  Yes Annett Gula, MD  pantoprazole (PROTONIX) 40 MG tablet Take 40 mg by mouth daily.   Yes Historical Provider, MD  rOPINIRole (REQUIP) 1 MG tablet Take 1 mg by mouth at bedtime.   Yes Historical Provider, MD  sertraline (ZOLOFT) 50 MG tablet Take 50 mg by mouth daily.   Yes Historical Provider, MD  zolpidem (AMBIEN) 5 MG tablet Take 5 mg by mouth at bedtime as needed for sleep.   Yes Historical Provider, MD     Allergies:  Allergies  Allergen Reactions  . Other Swelling and Other (See Comments)    Patient took cardizem and metformin after last hospital stay that caused her to very bad swelling all over body.     Social History:   reports that she quit smoking about 6 months ago. Her smoking use included Cigarettes. She has a 40 pack-year smoking history. She has never used smokeless tobacco. She reports that she does not drink alcohol. Her drug history is not on file.  Family History: History reviewed. No pertinent family history.   Physical Exam: Filed  Vitals:   09/09/12 1915 09/09/12 1930 09/09/12 2030 09/09/12 2130  BP: 88/39 106/84 73/43 76/28   Pulse: 84 98  91  Temp:      Resp: 19 22 17 14   SpO2: 95% 91%  94%   Blood pressure 76/28, pulse 91, temperature 98.3 F (36.8 C), resp. rate 14, SpO2 94.00%.  GEN:  Pleasant patient lying in the stretcher in no acute distress; cooperative with exam. PSYCH:  alert and oriented x4; does not appear anxious or depressed; affect is appropriate. HEENT: Mucous membranes pink and anicteric; PERRLA; EOM intact; no cervical lymphadenopathy nor thyromegaly or carotid bruit; no JVD; There were no stridor. Neck is very supple. Breasts:: Not examined CHEST WALL: No tenderness CHEST: Normal respiration, clear to auscultation bilaterally.  HEART: Regular rate and rhythm.  There are no murmur, rub, or gallops.   BACK: No kyphosis or scoliosis; no CVA tenderness ABDOMEN: soft and slightly tender, no masses, no organomegaly, normal abdominal bowel sounds; no pannus; no intertriginous candida. There is no  rebound and no distention, but her abdomen is very obese. Rectal Exam: Not done EXTREMITIES: No bone or joint deformity; age-appropriate arthropathy of the hands and knees; no edema; no ulcerations.  There is no calf tenderness. Genitalia: not examined PULSES: 2+ and symmetric SKIN: Normal hydration no rash or ulceration CNS: Cranial nerves 2-12 grossly intact no focal lateralizing neurologic deficit.  Speech is fluent; uvula elevated with phonation, facial symmetry and tongue midline. DTR are normal bilaterally, cerebella exam is intact, barbinski is negative and strengths are equaled bilaterally.  No sensory loss.   Labs on Admission:  Basic Metabolic Panel:  Recent Labs Lab 09/09/12 1610  NA 128*  K 3.6  CL 88*  CO2 26  GLUCOSE 144*  BUN 63*  CREATININE 2.56*  CALCIUM 8.2*   Liver Function Tests:  Recent Labs Lab 09/09/12 1610  AST 15  ALT 7  ALKPHOS 86  BILITOT 0.1*  PROT 5.2*   ALBUMIN 1.9*   No results found for this basename: LIPASE, AMYLASE,  in the last 168 hours No results found for this basename: AMMONIA,  in the last 168 hours CBC:  Recent Labs Lab 09/09/12 1610  WBC 16.0*  NEUTROABS 14.4*  HGB 11.4*  HCT 35.6*  MCV 84.0  PLT 264   Cardiac Enzymes: No results found for this basename: CKTOTAL, CKMB, CKMBINDEX, TROPONINI,  in the last 168 hours  CBG: No results found for this basename: GLUCAP,  in the last 168 hours   Radiological Exams on Admission: Dg Chest Portable 1 View  09/09/2012   *RADIOLOGY REPORT*  Clinical Data: 57 year old female with respiratory failure. Diarrhea.  PORTABLE CHEST - 1 VIEW  Comparison: 04/08/2012 and earlier.  Findings: Semi upright AP portable view 1750 hours.  Stable lung volumes.  Stable cardiac size and mediastinal contours.  No pneumothorax or pulmonary edema.  No pleural effusion or confluent pulmonary opacity.  Stable mild contour deformity associated with a small fat containing right diaphragmatic hernia (see 03/24/2012 CTA).  IMPRESSION: No acute cardiopulmonary abnormality.   Original Report Authenticated By: Erskine Speed, M.D.    Assessment/Plan Present on Admission:  . Intravascular volume depletion . Hypotension arterial . Obesity . Type 2 diabetes mellitus with hyperglycemia . COPD (chronic obstructive pulmonary disease) . Chronic pain  PLAN:  I think the scenario would be that she was given antibiotics, developped severe C diff infection, and became volume depleted with hypotension, pre-renal azotemia and AKI, along with hypovolemic hyponatremia.  She needs fluid resuscitation along with Tx for presumped C diff colitis.  Will start her on both PO Van and IV Flagyl along with Florastor.   For her DM, will decrease her basal insulin, and add SSI.  Her COPD is stable, and she will be continued on O2 supplement.   WIll follow her Cr carefully along with her BP.  I have held her Lasix.  Stool will be sent for  studies.  She will be admitted to SDU under [facility] service.  Thank you for allowng me to participate in the care of this nice patient.  Other plans as per orders.  Code Status: FULL Unk Lightning, MD. [facility] Pager [phone] 7pm to 7am.  09/09/2012, 10:36 PM

## 2012-09-09 NOTE — ED Notes (Signed)
Intensivist at bedside.

## 2012-09-09 NOTE — ED Notes (Signed)
Respiratory therapy at bedside for procedure.

## 2012-09-09 NOTE — ED Notes (Signed)
Pt presents to ED today with complaints of diarrhea and history of c-diff. Pt states she feels the same as last time she had c-diff.

## 2012-09-10 ENCOUNTER — Inpatient Hospital Stay (HOSPITAL_COMMUNITY): Payer: Medicare Other

## 2012-09-10 DIAGNOSIS — R0689 Other abnormalities of breathing: Secondary | ICD-10-CM | POA: Diagnosis present

## 2012-09-10 DIAGNOSIS — J9601 Acute respiratory failure with hypoxia: Secondary | ICD-10-CM | POA: Diagnosis present

## 2012-09-10 DIAGNOSIS — A419 Sepsis, unspecified organism: Secondary | ICD-10-CM | POA: Diagnosis present

## 2012-09-10 LAB — BLOOD GAS, ARTERIAL
Acid-base deficit: 3.1 mmol/L — ABNORMAL HIGH (ref 0.0–2.0)
Expiratory PAP: 6
FIO2: 0.3 %
Inspiratory PAP: 14
O2 Saturation: 96.4 %
Patient temperature: 98.6

## 2012-09-10 LAB — POCT I-STAT 3, ART BLOOD GAS (G3+)
Acid-base deficit: 6 mmol/L — ABNORMAL HIGH (ref 0.0–2.0)
Acid-base deficit: 9 mmol/L — ABNORMAL HIGH (ref 0.0–2.0)
Acid-base deficit: 10 mmol/L — ABNORMAL HIGH (ref 0.0–2.0)
Bicarbonate: 26 mEq/L — ABNORMAL HIGH (ref 20.0–24.0)
Bicarbonate: 23 mEq/L (ref 20.0–24.0)
Bicarbonate: 19.7 mEq/L — ABNORMAL LOW (ref 20.0–24.0)
Bicarbonate: 18.3 mEq/L — ABNORMAL LOW (ref 20.0–24.0)
O2 Saturation: 93 %
O2 Saturation: 96 %
Patient temperature: 97.9
Patient temperature: 98
Patient temperature: 97.3
TCO2: 28 mmol/L (ref 0–100)
TCO2: 21 mmol/L (ref 0–100)
TCO2: 20 mmol/L (ref 0–100)
pCO2 arterial: 65.2 mmHg (ref 35.0–45.0)
pH, Arterial: 7.208 — ABNORMAL LOW (ref 7.350–7.450)
pO2, Arterial: 100 mmHg (ref 80.0–100.0)

## 2012-09-10 LAB — GLUCOSE, CAPILLARY: Glucose-Capillary: 135 mg/dL — ABNORMAL HIGH (ref 70–99)

## 2012-09-10 LAB — CBC
Hemoglobin: 10.9 g/dL — ABNORMAL LOW (ref 12.0–15.0)
MCH: 27.1 pg (ref 26.0–34.0)
MCHC: 31.5 g/dL (ref 30.0–36.0)
MCHC: 31.1 g/dL (ref 30.0–36.0)
MCV: 87.1 fL (ref 78.0–100.0)
Platelets: 298 10*3/uL (ref 150–400)
RBC: 4.01 MIL/uL (ref 3.87–5.11)
RDW: 16 % — ABNORMAL HIGH (ref 11.5–15.5)
WBC: 13.2 10*3/uL — ABNORMAL HIGH (ref 4.0–10.5)

## 2012-09-10 LAB — URINALYSIS, ROUTINE W REFLEX MICROSCOPIC
Glucose, UA: NEGATIVE mg/dL
Glucose, UA: NEGATIVE mg/dL
Hgb urine dipstick: NEGATIVE
Ketones, ur: NEGATIVE mg/dL
Protein, ur: 30 mg/dL — AB
Specific Gravity, Urine: 1.019 (ref 1.005–1.030)
Specific Gravity, Urine: 1.016 (ref 1.005–1.030)
pH: 5 (ref 5.0–8.0)

## 2012-09-10 LAB — CLOSTRIDIUM DIFFICILE BY PCR: Toxigenic C. Difficile by PCR: POSITIVE — AB

## 2012-09-10 LAB — COMPREHENSIVE METABOLIC PANEL
ALT: 8 U/L (ref 0–35)
AST: 13 U/L (ref 0–37)
Albumin: 1.9 g/dL — ABNORMAL LOW (ref 3.5–5.2)
Alkaline Phosphatase: 55 U/L (ref 39–117)
CO2: 24 mEq/L (ref 19–32)
CO2: 22 mEq/L (ref 19–32)
Calcium: 7.6 mg/dL — ABNORMAL LOW (ref 8.4–10.5)
Chloride: 96 mEq/L (ref 96–112)
Creatinine, Ser: 2.05 mg/dL — ABNORMAL HIGH (ref 0.50–1.10)
GFR calc Af Amer: 24 mL/min — ABNORMAL LOW (ref >90)
GFR calc non Af Amer: 26 mL/min — ABNORMAL LOW (ref >90)
Glucose, Bld: 112 mg/dL — ABNORMAL HIGH (ref 70–99)
Potassium: 3.9 mEq/L (ref 3.5–5.1)
Sodium: 132 mEq/L — ABNORMAL LOW (ref 135–145)
Sodium: 136 mEq/L (ref 135–145)
Total Bilirubin: 0.1 mg/dL — ABNORMAL LOW (ref 0.3–1.2)
Total Protein: 5.1 g/dL — ABNORMAL LOW (ref 6.0–8.3)
Total Protein: 5 g/dL — ABNORMAL LOW (ref 6.0–8.3)

## 2012-09-10 LAB — TROPONIN I: Troponin I: <0.3 ng/mL (ref <0.30)

## 2012-09-10 LAB — TSH: TSH: 1.791 u[IU]/mL (ref 0.350–4.500)

## 2012-09-10 LAB — PROTIME-INR: Prothrombin Time: 13.6 seconds (ref 11.6–15.2)

## 2012-09-10 LAB — APTT: aPTT: 23 seconds — ABNORMAL LOW (ref 24–37)

## 2012-09-10 LAB — URINE MICROSCOPIC-ADD ON

## 2012-09-10 LAB — CARBOXYHEMOGLOBIN: Carboxyhemoglobin: 1.7 % — ABNORMAL HIGH (ref 0.5–1.5)

## 2012-09-10 LAB — TYPE AND SCREEN: Antibody Screen: NEGATIVE

## 2012-09-10 LAB — FIBRINOGEN: Fibrinogen: 621 mg/dL — ABNORMAL HIGH (ref 204–475)

## 2012-09-10 LAB — CORTISOL: Cortisol, Plasma: 16.8 ug/dL

## 2012-09-10 MED ORDER — INSULIN ASPART 100 UNIT/ML ~~LOC~~ SOLN
0.0000 [IU] | SUBCUTANEOUS | Status: DC
  Administered 2012-09-10: 1 [IU] via SUBCUTANEOUS
  Administered 2012-09-10: 2 [IU] via SUBCUTANEOUS
  Administered 2012-09-11 (×4): 1 [IU] via SUBCUTANEOUS
  Administered 2012-09-12 (×2): 2 [IU] via SUBCUTANEOUS

## 2012-09-10 MED ORDER — NOREPINEPHRINE BITARTRATE 1 MG/ML IJ SOLN
5.0000 ug/min | INTRAVENOUS | Status: DC
  Administered 2012-09-10: 5 ug/min via INTRAVENOUS
  Administered 2012-09-10: 15 ug/min via INTRAVENOUS
  Filled 2012-09-10 (×2): qty 8

## 2012-09-10 MED ORDER — ALBUTEROL SULFATE (5 MG/ML) 0.5% IN NEBU
2.5000 mg | INHALATION_SOLUTION | Freq: Four times a day (QID) | RESPIRATORY_TRACT | Status: DC
  Administered 2012-09-10 – 2012-09-12 (×8): 2.5 mg via RESPIRATORY_TRACT
  Filled 2012-09-10 (×8): qty 0.5

## 2012-09-10 MED ORDER — VANCOMYCIN 50 MG/ML ORAL SOLUTION: 500.0000 mg | Freq: Four times a day (QID) | ORAL | Status: DC

## 2012-09-10 MED ORDER — ROCURONIUM BROMIDE 50 MG/5ML IV SOLN
INTRAVENOUS | Status: AC
  Administered 2012-09-10: 30 mg
  Filled 2012-09-10: qty 2

## 2012-09-10 MED ORDER — MIDAZOLAM HCL 2 MG/2ML IJ SOLN
INTRAMUSCULAR | Status: AC
  Administered 2012-09-10: 2 mg via INTRAVENOUS
  Filled 2012-09-10: qty 4

## 2012-09-10 MED ORDER — IPRATROPIUM BROMIDE 0.02 % IN SOLN
0.5000 mg | Freq: Four times a day (QID) | RESPIRATORY_TRACT | Status: DC
  Administered 2012-09-10 – 2012-09-12 (×8): 0.5 mg via RESPIRATORY_TRACT
  Filled 2012-09-10 (×8): qty 2.5

## 2012-09-10 MED ORDER — BIOTENE DRY MOUTH MT LIQD
15.0000 mL | Freq: Two times a day (BID) | OROMUCOSAL | Status: DC
  Administered 2012-09-10: 15 mL via OROMUCOSAL

## 2012-09-10 MED ORDER — CHLORHEXIDINE GLUCONATE 0.12 % MT SOLN
15.0000 mL | Freq: Two times a day (BID) | OROMUCOSAL | Status: DC
  Administered 2012-09-10: 15 mL via OROMUCOSAL
  Filled 2012-09-10: qty 15

## 2012-09-10 MED ORDER — MIDAZOLAM HCL 2 MG/2ML IJ SOLN
2.0000 mg | INTRAMUSCULAR | Status: DC | PRN
  Administered 2012-09-11 – 2012-09-12 (×8): 2 mg via INTRAVENOUS
  Filled 2012-09-10 (×8): qty 2

## 2012-09-10 MED ORDER — METRONIDAZOLE IN NACL 5-0.79 MG/ML-% IV SOLN
500.0000 mg | Freq: Three times a day (TID) | INTRAVENOUS | Status: DC
  Administered 2012-09-10 – 2012-09-12 (×6): 500 mg via INTRAVENOUS
  Filled 2012-09-10 (×8): qty 100

## 2012-09-10 MED ORDER — SODIUM CHLORIDE 0.9 % IV SOLN
INTRAVENOUS | Status: DC
  Administered 2012-09-10: 19:00:00 via INTRAVENOUS

## 2012-09-10 MED ORDER — FENTANYL CITRATE 0.05 MG/ML IJ SOLN
INTRAMUSCULAR | Status: AC
  Administered 2012-09-10: 50 ug via INTRAVENOUS
  Filled 2012-09-10: qty 4

## 2012-09-10 MED ORDER — ACETAMINOPHEN 650 MG RE SUPP
650.0000 mg | Freq: Four times a day (QID) | RECTAL | Status: DC | PRN
Start: 2012-09-10 — End: 2012-09-13

## 2012-09-10 MED ORDER — SODIUM CHLORIDE 0.9 % IV SOLN
INTRAVENOUS | Status: DC
  Administered 2012-09-10 – 2012-09-12 (×5): via INTRAVENOUS

## 2012-09-10 MED ORDER — SODIUM CHLORIDE 0.9 % IV BOLUS (SEPSIS)
500.0000 mL | Freq: Once | INTRAVENOUS | Status: AC
  Administered 2012-09-10: 500 mL via INTRAVENOUS

## 2012-09-10 MED ORDER — FAMOTIDINE IN NACL 20-0.9 MG/50ML-% IV SOLN
20.0000 mg | Freq: Two times a day (BID) | INTRAVENOUS | Status: DC
  Administered 2012-09-10 (×2): 20 mg via INTRAVENOUS
  Filled 2012-09-10 (×4): qty 50

## 2012-09-10 MED ORDER — VASOPRESSIN 20 UNIT/ML IJ SOLN
0.0300 [IU]/min | INTRAVENOUS | Status: DC
  Administered 2012-09-10: 0.03 [IU]/min via INTRAVENOUS
  Filled 2012-09-10: qty 2.5

## 2012-09-10 MED ORDER — SUCCINYLCHOLINE CHLORIDE 20 MG/ML IJ SOLN
INTRAMUSCULAR | Status: AC
  Filled 2012-09-10: qty 1

## 2012-09-10 MED ORDER — SODIUM CHLORIDE 0.9 % IV BOLUS (SEPSIS): 1000.0000 mL | INTRAVENOUS | Status: DC | PRN

## 2012-09-10 MED ORDER — SODIUM CHLORIDE 0.9 % IV BOLUS (SEPSIS)
2000.0000 mL | INTRAVENOUS | Status: DC | PRN
  Administered 2012-09-10: 2000 mL via INTRAVENOUS

## 2012-09-10 MED ORDER — VANCOMYCIN 50 MG/ML ORAL SOLUTION
500.0000 mg | Freq: Four times a day (QID) | ORAL | Status: DC
  Administered 2012-09-10 – 2012-09-12 (×7): 500 mg
  Filled 2012-09-10 (×11): qty 10

## 2012-09-10 MED ORDER — NOREPINEPHRINE BITARTRATE 1 MG/ML IJ SOLN: 5.0000 ug/min | INTRAVENOUS | Status: DC

## 2012-09-10 MED ORDER — FENTANYL CITRATE 0.05 MG/ML IJ SOLN
50.0000 ug | INTRAMUSCULAR | Status: DC | PRN
  Administered 2012-09-10: 100 ug via INTRAVENOUS
  Administered 2012-09-10: 50 ug via INTRAVENOUS
  Administered 2012-09-11: 100 ug via INTRAVENOUS
  Administered 2012-09-11 (×2): 50 ug via INTRAVENOUS
  Administered 2012-09-11 – 2012-09-12 (×5): 100 ug via INTRAVENOUS
  Filled 2012-09-10 (×9): qty 2

## 2012-09-10 MED ORDER — VANCOMYCIN 50 MG/ML ORAL SOLUTION
500.0000 mg | Freq: Four times a day (QID) | ORAL | Status: DC
  Filled 2012-09-10 (×4): qty 10

## 2012-09-10 MED ORDER — ETOMIDATE 2 MG/ML IV SOLN
INTRAVENOUS | Status: AC
  Administered 2012-09-10: 20 mg
  Filled 2012-09-10: qty 20

## 2012-09-10 MED ORDER — LIDOCAINE HCL (CARDIAC) 20 MG/ML IV SOLN
INTRAVENOUS | Status: AC
  Filled 2012-09-10: qty 5

## 2012-09-10 MED ORDER — VANCOMYCIN HCL 500 MG IV SOLR
500.0000 mg | Freq: Four times a day (QID) | Status: DC
  Administered 2012-09-10 – 2012-09-12 (×7): 500 mg via RECTAL
  Filled 2012-09-10 (×12): qty 500

## 2012-09-10 NOTE — Progress Notes (Signed)
eLink Physician-Brief Progress Note Patient Name: [name] DOB: 11/30/1955 MRN: [id number]  Date of Service  09/10/2012   HPI/Events of Note   Hypercarbia   eICU Interventions   RR 22-->28 ABG@1900     Intervention Category Major Interventions: Respiratory failure - evaluation and management  [name] 09/10/2012, 5:58 PM

## 2012-09-10 NOTE — Progress Notes (Signed)
[facility] Progress Note [place] TEAM 1 - Stepdown/ICU TEAM   [name] QIH:[id number] DOB: [birth year] DOA: 09/09/2012 PCP: [name], NP  Brief narrative: 57 year old female patient with multiple medical problems: COPD on chronic nasal cannula oxygen 3 L, diabetes, obesity and coronary artery disease. She was hospitalized in January of this year with H1N1 influenza as well as pneumococcal pneumonia. Hospital course complicated by prolonged respiratory failure requiring ventilator; this was associated with tracheitis. She eventually required tracheostomy tube and was subsequently discharged to [facility]. While in [facility] she was diagnosed with C. difficile colitis and apparently completed treatment.  She was brought to the hospital this admission because of persistent foul-smelling diarrhea for the past 2 months since being discharged from the hospital. She has been unable to take in adequate oral intake at home. She has not had any black or bloody stools but had been experiencing some low-grade fevers and abdominal discomfort. On several occasions her husband urged her to seek medical attention she did not call her primary care physician until the date of admission who recommended she proceed immediately to the emergency department. She was found to be hypotensive with a systolic blood pressure in the 70s, acute renal failure with a BUN of 63 and a creatinine of 2.56 with a baseline creatinine of 0.9. Showed leukocytosis with a white count of 16,000 and hyponatremia with a sodium of 128. PCCM was consulted and recommended she be admitted by the hospitalists. IV fluids were initiated and because of concerns over inaccurate cuff blood pressure readings an arterial line was placed.  Assessment/Plan:    Severe sepsis with septic shock -source is RECURRENT/PERSISTENT C. DIFF COLITIS -continue supportive care including antiinfectives and aggressive hydration -MAP has  been in the mid 50's and she may need pressors    Acute renal failure/?? ATN -due to dehydration, sepsis and low perfusion from hypotension which was present for unknown days preadmission -foley has been inserted and urine is tea colored -follow lytes -was also on Lasix at home and unclear if continued to take    Recurrent Clostridium difficile diarrhea -severe in nature so cont IV Flagyl but since AMS will give Vanco PR -KUB shows bowel edema in colon- may need CT with enteral contrast (per NGT) to determine if has toxic megacolon esp given duration of sx's and associated shock    COPD (chronic obstructive pulmonary disease) -seems compensated- mild CO2 retainer per review prior ABG's    Acute respiratory failure with hypoxia and Hypercapnia -due to sepsis/shock -Initially on 5 L oxygen but only able to maintain sats to 95% -minimal improvement in pO2 and pCO2 after BiPAP and concern over inability to protect airway so given all of above have re-consulted PCCM    Obesity -?? OSA    Chronic pain -meds on hold- watch for withdrawal sx's    Type 2 diabetes mellitus with hyperglycemia -primarily mange CBG with SSI w/ q 4 hour checks--Lantus on hold -persistent hyperglycemia may require insulin infusion -current CBG well controlled   DVT prophylaxis: Subcutaneous heparin Code Status: Full Family Communication: No family at bedside at time of our examination. Disposition Plan: Transfer to ICU-PCCM to assume care Isolation: Contact for C. difficile colitis  Consultants: PCCM   Procedures: Arterial line 7/9 >>>  Antibiotics: Flagyl 7/9 >>> Vancomycin 7/9 >>>  HPI/Subjective: Patient initially somewhat lethargic but arousable and able to verbally communicate appropriately. Within one hour of our initial encounter patient became essentially unresponsive. ABG revealed acidosis hypercarbia  and hypoxemia.  Objective: Blood pressure 83/33, pulse 86, temperature 98 F (36.7 C),  temperature source Axillary, resp. rate 17, height 5' 5.5" (1.664 m), weight 104.4 kg (230 lb 2.6 oz), SpO2 90.00%.  Intake/Output Summary (Last 24 hours) at 09/10/12 1250 Last data filed at 09/10/12 1200  Gross per 24 hour  Intake 1836.25 ml  Output    500 ml  Net 1336.25 ml    Exam: General: No acute respiratory distress Lungs: Coarse to auscultation bilaterally without wheezes or crackles very diminished throughout all lung fields, 5 L saturations 95% Cardiovascular: Regular rate and rhythm without murmur gallop or rub normal S1 and S2, no peripheral edema or JVD; systolic blood pressure low ranging in the past 8 hours from a low of 89 to a high of 107 but in a piece have been primarily in the high 40s to low 50 range Abdomen: Diffusely tender to palaption, nondistended, soft, very bowel sounds positive, no rebound, no ascites, no appreciable mass Genitourinary: Unable to void-bladder scan greater than 600 cc of urine-Foley catheter inserted tea colored urine return Musculoskeletal: No significant cyanosis, clubbing of bilateral lower extremities Neurological: Initially awakened and appeared oriented x3 but later became unresponsive, earlier was able to move all extremities x 4 without focal neurological deficits and CN 2-12 were grossly intact  Scheduled Meds: Scheduled Meds: . ipratropium  0.5 mg Nebulization TID   And  . albuterol  2.5 mg Nebulization TID  . antiseptic oral rinse  15 mL Mouth Rinse QID  . heparin  5,000 Units Subcutaneous Q8H  . insulin aspart  0-9 Units Subcutaneous Q4H  . metronidazole  500 mg Intravenous Q8H  . sodium chloride  3 mL Intravenous Q12H  . vancomycin (VANCOCIN) rectal ENEMA  500 mg Rectal Q6H   Continuous Infusions: . sodium chloride 200 mL/hr at 09/10/12 1100    Data Reviewed: Basic Metabolic Panel:  Recent Labs Lab 09/09/12 1610 09/09/12 2315 09/10/12 0340  NA 128*  --  132*  K 3.6  --  3.9  CL 88*  --  96  CO2 26  --  24   GLUCOSE 144*  --  112*  BUN 63*  --  61*  CREATININE 2.56* 2.31* 2.47*  CALCIUM 8.2*  --  7.6*   Liver Function Tests:  Recent Labs Lab 09/09/12 1610 09/10/12 0340  AST 15 14  ALT 7 8  ALKPHOS 86 55  BILITOT 0.1* 0.1*  PROT 5.2* 5.1*  ALBUMIN 1.9* 1.9*   CBC:  Recent Labs Lab 09/09/12 1610 09/09/12 2315 09/10/12 0340  WBC 16.0* 14.6* 13.2*  NEUTROABS 14.4*  --   --   HGB 11.4* 11.2* 10.9*  HCT 35.6* 34.4* 34.6*  MCV 84.0 84.3 86.3  PLT 264 273 270   BNP (last 3 results)  Recent Labs  03/19/12 0546 03/23/12 0612 03/27/12 0627  PROBNP 309.5* 184.7* 100.1   CBG:  Recent Labs Lab 09/10/12 0116 09/10/12 0747 09/10/12 1214  GLUCAP 88 138* 135*    Recent Results (from the past 240 hour(s))  CLOSTRIDIUM DIFFICILE BY PCR     Status: Abnormal   Collection Time    09/09/12  4:15 PM      Result Value Range Status   C difficile by pcr POSITIVE (*) NEGATIVE Final   Comment: CRITICAL RESULT CALLED TO, READ BACK BY AND VERIFIED WITH:     L.YOUNG RN 0309 09/10/12 E.GADDY  MRSA PCR SCREENING     Status: None   Collection Time  09/09/12 11:11 PM      Result Value Range Status   MRSA by PCR NEGATIVE  NEGATIVE Final   Comment:            The GeneXpert MRSA Assay (FDA     approved for NASAL specimens     only), is one component of a     comprehensive MRSA colonization     surveillance program. It is not     intended to diagnose MRSA     infection nor to guide or     monitor treatment for     MRSA infections.     Studies:  Recent x-ray studies have been reviewed in detail by the Attending Physician   Junious Silk, ANP [facility] Office  [phone] Pager [phone]  **If unable to reach the above provider after paging please contact the Flow Manager @ [phone]  On-Call/Text Page:      [email or web address]      password [email or web address]  If 7PM-7AM, please contact night-coverage [email or web address] Password [email or web address] 09/10/2012, 12:50 PM   LOS: 1 day   I have  personally examined this patient and reviewed the entire database. I have reviewed the above note, made any necessary editorial changes, and agree with its content.  Lonia Blood, MD [facility]

## 2012-09-10 NOTE — Progress Notes (Signed)
eLink Physician-Brief Progress Note Patient Name: [name] DOB: 1955/11/18 MRN: [id number]  Date of Service  09/10/2012   HPI/Events of Note   Hypercarbia   eICU Interventions   RR increased 16-->22 ABG in 1 hour    Intervention Category Major Interventions: Respiratory failure - evaluation and management  Bryson Palen 09/10/2012, 4:24 PM

## 2012-09-10 NOTE — Progress Notes (Signed)
Unknown when patient last voided.  Patient does not feel she needs to void and is unable to void.  Bladder scanned with greater than in bladder.  In and out cath completed using sterile technique with tea colored urine returned.  Procedure explained to patient and patient tolerated well. Patient lethargic but oriented to self when woken up.  Patient falls asleep in middle of sentences.  Respiratory at bedside, ABG complete. Ph 7.2 pco2 66.3 po2 81 hco3 26.0 sats 92% on 5L.  Dr Sharon Seller called with results.  Verbal order to place on BiPAP.  Will continue to monitor.

## 2012-09-10 NOTE — Progress Notes (Signed)
Assumed care at this time. Pt resting in bed. No s/sx of distress noted. Vitals and assessment as documented.  

## 2012-09-10 NOTE — Progress Notes (Signed)
ANTIBIOTIC CONSULT NOTE - FOLLOW UP  Pharmacy Consult for vancomycin Indication: C. Diff.  Allergies  Allergen Reactions  . Other Swelling and Other (See Comments)    Patient took cardizem and metformin after last hospital stay that caused her to very bad swelling all over body.     Patient Measurements: Height: 5' 5.5" (166.4 cm) Weight: 230 lb 2.6 oz (104.4 kg) IBW/kg (Calculated) : 58.15  Vital Signs: Temp: 97.9 F (36.6 C) (07/09 0745) Temp src: Oral (07/09 0745) BP: 99/34 mmHg (07/09 1000) Pulse Rate: 91 (07/09 1000) Intake/Output from previous day: 07/08 0701 - 07/09 0700 In: 1000 [I.V.:800; IV Piggyback:200] Out: -  Intake/Output from this shift: Total I/O In: 250 [I.V.:250] Out: 500 [Urine:500]  Labs:  Recent Labs  09/09/12 1610 09/09/12 2315 09/10/12 0340  WBC 16.0* 14.6* 13.2*  HGB 11.4* 11.2* 10.9*  PLT 264 273 270  CREATININE 2.56* 2.31* 2.47*   Estimated Creatinine Clearance: 30.1 ml/min (by C-G formula based on Cr of 2.47). No results found for this basename: VANCOTROUGH, VANCOPEAK, VANCORANDOM, GENTTROUGH, GENTPEAK, GENTRANDOM, TOBRATROUGH, TOBRAPEAK, TOBRARND, AMIKACINPEAK, AMIKACINTROU, AMIKACIN,  in the last 72 hours    Assessment: 51 yof presented to the ED with abdominal pain and diarrhea. Patient noted C. Diff. PCR positive.  Per H&P, patient noted with hypotension (SBP in 70s) on admission and current BP 99/34.   7/8 vancomycin po>> 7/8 flagyl>>  Plan:  -Will change vancomycin po to 500mg  po q6h for severe C. Diff with hypotension (length of therapy to be 14 days)  Harland German, Pharm D 09/10/2012 10:17 AM

## 2012-09-10 NOTE — Procedures (Signed)
Central Venous Catheter Insertion Procedure Note [name] [id number] Jun 30, 1955  Procedure: Insertion of Central Venous Catheter Indications: Assessment of intravascular volume and Drug and/or fluid administration  Procedure Details Consent: Risks of procedure as well as the alternatives and risks of each were explained to the (patient/caregiver).  Consent for procedure obtained. Time Out: Verified patient identification, verified procedure, site/side was marked, verified correct patient position, special equipment/implants available, medications/allergies/relevent history reviewed, required imaging and test results available.  Performed  Maximum sterile technique was used including antiseptics, cap, gloves, gown, hand hygiene, mask and sheet. Skin prep: Chlorhexidine; local anesthetic administered A antimicrobial bonded/coated triple lumen catheter was placed in the right internal jugular vein using the Seldinger technique.  Evaluation Blood flow good Complications: No apparent complications Patient did tolerate procedure well. Chest X-ray ordered to verify placement.  CXR: pending.  Performed using ultrasound guidance under direct MD supervision by [facility] ACNP-student   Danford Bad, NP 09/10/2012  2:38 PM  I was present for and supervised the entire procedure  Billy Fischer, MD ; [facility] service Mobile [phone].  After 5:30 PM or weekends, call [phone]

## 2012-09-10 NOTE — Progress Notes (Signed)
PULMONARY  / CRITICAL CARE MEDICINE  Name: [name] MRN: [id number] DOB: 03-10-1955    ADMISSION DATE:  09/09/2012 CONSULTATION DATE:  09/10/12  REFERRING MD :  [facility] PRIMARY SERVICE: PCCM  CHIEF COMPLAINT:  Septic shock secondary to C. Difficile  BRIEF PATIENT DESCRIPTION: 57 y.o. Female with PMH of COPD(baseline 3L [place]) diabetes, obesity and CAD was admitted by [facility] 7/8 for 2 months of diarrhea.  Patient severely hypotensive.  PCCM called to consult on septic shock.   SIGNIFICANT EVENTS / STUDIES:  07/2012 C. Difficile 07/2012 d/c from [facility] ----------------------------------------------- 7/08 Admitted to [facility] 7/09 Failed Bipap therapy, intubated 7/09 Hypotensive episode requiring pressors  LINES / TUBES: 7/09 R IJ >>> 7/09 ETT >>>  CULTURES: 7/09 Blood >> 7/09 Urine >>  ANTIBIOTICS: 7/09 Vanc (enteral) >>> 7/09 Vanc enema >>> 7/09 metronidazole >>>  HISTORY OF PRESENT ILLNESS:  57 y.o. Obese white female with PMH of COPD(baseline 3L [place]) diabetes, obesity and CAD was admitted by [facility] for 2 months of diarrhea.  Patient had a recent admission to [facility] for ventilator weaning and rehab.  Patient was diagnosed with C. Diff while in the hospital and received a full course of treatment but states that she has continued to have diarrhea since she was discharged home.  Patient presented to ED at urging of PCP and was found to be severely hypotensive with elevated SCr.  TRH treated with IV hydration for hypotension and AKI.  Despite IV hydration patient remained severely hypotensive, and subsequently became dyspneic and was put on bipap therapy.  PCCM initiated sepsis protocol, intubated the patient, and placed a central venous line.  PAST MEDICAL HISTORY :  Past Medical History  Diagnosis Date  . COPD (chronic obstructive pulmonary disease)   . Respiratory failure   . Morbid obesity   . Tobacco abuse   . GERD (gastroesophageal reflux disease)   . Hyperlipidemia   .  Chronic pain   . Hypertension   . Heart attack   . Arthritis   . Allergic rhinitis   . Diabetes mellitus, type 2   . Diabetes mellitus type 2 in obese 02/29/2012  . HTN (hypertension) 02/29/2012   Past Surgical History  Procedure Laterality Date  . Cholecystectomy    . Partial hysterectomy    . Angioplasty    . Umbilical hernia repair  12/2009   Prior to Admission medications   Medication Sig Start Date End Date Taking? Authorizing Provider  acetaminophen (TYLENOL) 325 MG tablet Take 650 mg by mouth every 6 (six) hours as needed for pain.   Yes Historical Provider, MD  antiseptic oral rinse (BIOTENE) LIQD 15 mLs by Mouth Rinse route QID. 03/12/12  Yes Annett Gula, MD  aspirin 81 MG chewable tablet Chew 1 tablet (81 mg total) by mouth daily. 03/12/12  Yes Annett Gula, MD  diclofenac sodium (VOLTAREN) 1 % GEL Apply 4 g topically 2 (two) times daily as needed (for foot pain).    Yes Historical Provider, MD  furosemide (LASIX) 20 MG tablet Take 20 mg by mouth 2 (two) times daily.    Yes Historical Provider, MD  gabapentin (NEURONTIN) 100 MG capsule Take 1 capsule (100 mg total) by mouth 3 (three) times daily. 04/21/12  Yes Tammy S Parrett, NP  glimepiride (AMARYL) 2 MG tablet Take 2 mg by mouth 2 (two) times daily with a meal.   Yes Historical Provider, MD  HYDROcodone-acetaminophen (NORCO) 7.5-325 MG per tablet Take 1 tablet by mouth every 4 (  four) hours as needed for pain.   Yes Historical Provider, MD  insulin aspart (NOVOLOG) 100 UNIT/ML injection Inject 0-8 Units into the skin 3 (three) times daily with meals. On sliding scale:  0-100 0 units 101-150 take 3 units 151-200 take 5 units 201>300 take 8 units 400 or higher call MD   Yes Historical Provider, MD  insulin glargine (LANTUS) 100 UNIT/ML injection Inject 32 Units into the skin at bedtime. 03/12/12  Yes Annett Gula, MD  ipratropium-albuterol (DUONEB) 0.5-2.5 (3) MG/3ML SOLN Take 3 mLs by nebulization 3 (three) times daily.    Yes Historical Provider, MD  isosorbide mononitrate (IMDUR) 30 MG 24 hr tablet Take 1 tablet (30 mg total) by mouth daily. 03/12/12  Yes Annett Gula, MD  pantoprazole (PROTONIX) 40 MG tablet Take 40 mg by mouth daily.   Yes Historical Provider, MD  rOPINIRole (REQUIP) 1 MG tablet Take 1 mg by mouth at bedtime.   Yes Historical Provider, MD  sertraline (ZOLOFT) 50 MG tablet Take 50 mg by mouth daily.   Yes Historical Provider, MD  zolpidem (AMBIEN) 5 MG tablet Take 5 mg by mouth at bedtime as needed for sleep.   Yes Historical Provider, MD   Allergies  Allergen Reactions  . Other Swelling and Other (See Comments)    Patient took cardizem and metformin after last hospital stay that caused her to very bad swelling all over body.     FAMILY HISTORY:  History reviewed. No pertinent family history. SOCIAL HISTORY:  reports that she quit smoking about 6 months ago. Her smoking use included Cigarettes. She has a 40 pack-year smoking history. She has never used smokeless tobacco. She reports that she does not drink alcohol. Her drug history is not on file.  REVIEW OF SYSTEMS:   Unable to perform - pt sedated on vent   SUBJECTIVE:   VITAL SIGNS: Temp:  [97.9 F (36.6 C)-98.3 F (36.8 C)] 98 F (36.7 C) (07/09 1211) Pulse Rate:  [82-101] 85 (07/09 1300) Resp:  [14-27] 17 (07/09 1300) BP: (68-106)/(25-84) 85/36 mmHg (07/09 1300) SpO2:  [80 %-98 %] 92 % (07/09 1300) Arterial Line BP: (89-118)/(26-48) 96/36 mmHg (07/09 1300) FiO2 (%):  [30 %] 30 % (07/09 1200) Weight:  [104.4 kg (230 lb 2.6 oz)] 104.4 kg (230 lb 2.6 oz) (07/09 0000) HEMODYNAMICS:   VENTILATOR SETTINGS: Vent Mode:  [-] BIPAP FiO2 (%):  [30 %] 30 % Set Rate:  [16 bmp] 16 bmp INTAKE / OUTPUT: Intake/Output     07/08 0701 - 07/09 0700 07/09 0701 - 07/10 0700   I.V. (mL/kg) 800 (7.7) 836.3 (8)   IV Piggyback 200    Total Intake(mL/kg) 1000 (9.6) 836.3 (8)   Urine (mL/kg/hr)  500 (0.7)   Total Output   500   Net +1000  +336.3        Stool Occurrence 2 x      PHYSICAL EXAMINATION: General:  Obese, acutely ill appearing woman on bipap Neuro: Somnolent but arousable, follows commands  HEENT:  MM dry and pink, no noted JVD, no carotid bruits Cardiovascular:  RRR, no M/R/G Lungs:  Diminished breath sounds at bases, no wheezes Abdomen:  Soft, mildly tender, +BS,  Musculoskeletal:  No gross deformities, moves extremities x4 Skin:  No obvious breakdowns noted  LABS:  Recent Labs Lab 09/09/12 1610 09/09/12 1756 09/09/12 2315 09/10/12 0340 09/10/12 1006 09/10/12 1215  HGB 11.4*  --  11.2* 10.9*  --   --   WBC 16.0*  --  14.6* 13.2*  --   --   PLT 264  --  273 270  --   --   NA 128*  --   --  132*  --   --   K 3.6  --   --  3.9  --   --   CL 88*  --   --  96  --   --   CO2 26  --   --  24  --   --   GLUCOSE 144*  --   --  112*  --   --   BUN 63*  --   --  61*  --   --   CREATININE 2.56*  --  2.31* 2.47*  --   --   CALCIUM 8.2*  --   --  7.6*  --   --   AST 15  --   --  14  --   --   ALT 7  --   --  8  --   --   ALKPHOS 86  --   --  55  --   --   BILITOT 0.1*  --   --  0.1*  --   --   PROT 5.2*  --   --  5.1*  --   --   ALBUMIN 1.9*  --   --  1.9*  --   --   LATICACIDVEN  --  0.54  --   --   --   --   PHART  --   --   --   --  7.208* 7.228*  PCO2ART  --   --   --   --  65.2* 58.4*  PO2ART  --   --   --   --  79.0* 104.0*    Recent Labs Lab 09/10/12 0116 09/10/12 0747 09/10/12 1214  GLUCAP 88 138* 135*    CXR: Mild cephalization, ETT and RIJ in good position  ASSESSMENT / PLAN:  PULMONARY A: Acute on Chronic Hypercarbic Respiratory failure COPD H/O ARDS/H1N1  P:   -intubate  -Vent support 8cc/kg, Peep 5, rate 14 -Duoneb prn q6hr for wheezing -supplemental O2 goal sats >90% -Abg stat post intubation  -CXR now -CXR in am -SBTs if indicated   CARDIOVASCULAR A:  Septic Shock H/o hyperlipidemia H/o hypertension P:  -Sepsis protocol initiated -2 L NS bolus  given -titrate Vasopressin and Levo as needed, goal MAP >65 -EKG stat -Troponins q6h -lactic acid stat  RENAL A:   AKI- baseline SCr 0.9 Hyponatremia P:   -NS @200  mL/hr -hold lasix until BP stable -Bmet stat -Bmet in am  GASTROINTESTINAL A:   C. Difficile Enterocolitis GERD P:   -NPO -Famotidine for SUP -See ID for abx  HEMATOLOGIC A:   Normocytic anemia P:  -cbc stat -cbc in am -INR stat -Subq heparin for DVT prophylaxis  INFECTIOUS A:   Septic shock secondary to C. Diff enterocolitis P:   -Vanc per tube, and per rectum -IV flagyl -monitor fever curve and WBCs trend -tylenol prn for fever  ENDOCRINE A:   DM II R/o Adrenal Insufficiency  P: -SSI -cortisol level now -consider stress steroids   NEUROLOGIC A:   Acute encephalopathy Chronic pain P:   -intermittent sedation protocol -Daily WUA -Fentanyl push prn for pain     TODAY'S SUMMARY:     I have personally obtained a history, examined the patient, evaluated laboratory and imaging results, formulated the assessment and plan  and placed orders. CRITICAL CARE: The patient is critically ill with multiple organ systems failure and requires high complexity decision making for assessment and support, frequent evaluation and titration of therapies, application of advanced monitoring technologies and extensive interpretation of multiple databases. Critical Care Time devoted to patient care services described in this note is 40 minutes.    Billy Fischer, MD ; [facility] [phone].  After 5:30 PM or weekends, call [phone]

## 2012-09-10 NOTE — Progress Notes (Signed)
SBAR report given to Suzzette Righter RN.

## 2012-09-10 NOTE — Care Management Note (Signed)
    Page 1 of 1   09/10/2012     11:13:06 AM   CARE MANAGEMENT NOTE 09/10/2012  Patient:  [name], [name]   Account Number:  [id number]  Date Initiated:  09/10/2012  Documentation initiated by:  Junius Creamer  Subjective/Objective Assessment:   adm w hypotention, volume depletion     Action/Plan:   lives w husband, pcp dr Darl Pikes fuller   Anticipated DC Date:     Anticipated DC Plan:        DC Planning Services  CM consult      Choice offered to / List presented to:             Status of service:   Medicare Important Message given?   (If response is "NO", the following Medicare IM given date fields will be blank) Date Medicare IM given:   Date Additional Medicare IM given:    Discharge Disposition:    Per UR Regulation:  Reviewed for med. necessity/level of care/duration of stay  If discussed at Long Length of Stay Meetings, dates discussed:    Comments:

## 2012-09-10 NOTE — Progress Notes (Signed)
Increased set vent rate to 22 per MD order (per ABG results).

## 2012-09-10 NOTE — Progress Notes (Signed)
Assumed care at this time. Pt resting in bed, intubated, sedated. Levophed infusing. Vitals and assessment as documented. Monitoring closely.

## 2012-09-10 NOTE — Progress Notes (Signed)
Vent rate increased to 28 per MD order.

## 2012-09-10 NOTE — ED Provider Notes (Signed)
Medical screening examination/treatment/procedure(s) were conducted as a shared visit with non-physician practitioner(s) and myself.  I personally evaluated the patient during the encounter  Doug Sou, MD 09/10/12 0145

## 2012-09-10 NOTE — Progress Notes (Signed)
INITIAL NUTRITION ASSESSMENT  DOCUMENTATION CODES Per approved criteria  -Obesity Unspecified   INTERVENTION: 1.  Supplements; Resource Breeze po TID, each supplement provides 250 kcal and 9 grams of protein once diet advanced.   NUTRITION DIAGNOSIS: Unintended wt change related to poor appetite and intake as evidenced by pt report, 20 lbs wt loss.   Monitor:  1.  Food/Beverage; pt meeting >/=90% estimated needs with tolerance. 2.  Wt/wt change; monitor trends   Reason for Assessment: MST  57 y.o. female  Admitting Dx: Intravascular volume depletion  ASSESSMENT: Pt admitted with diarrhea and weakness. Pt sleeping soundly at time of visit with Bi-pap.  Unable to participate in nutrition interview.  RD to follow for diet advancement as respiratory status improves.  Pt with suspected wt loss in setting of excessive, persistent diarrhea. Pt has lost ~20 lbs.  Pt with possible sepsis r/t to recurrent C.diff.  Question translocation due to poor gut integrity.  Nutrition Focused Physical Exam:  Subcutaneous Fat:  Orbital Region: wnl Upper Arm Region: wnl Thoracic and Lumbar Region: wnl  Muscle:  Temple Region: wnl Clavicle Bone Region: wnl Clavicle and Acromion Bone Region: wnl Scapular Bone Region: wnl Dorsal Hand: wnl Patellar Region: wnl Anterior Thigh Region: wnl Posterior Calf Region: wnl  Edema: none present  Suspect some level of undernourishment based on reported poor PO with resulting wt loss, however wt loss not clinically significant at this time.   Height: Ht Readings from Last 1 Encounters:  09/10/12 5' 5.5" (1.664 m)    Weight: Wt Readings from Last 1 Encounters:  09/10/12 230 lb 2.6 oz (104.4 kg)    Ideal Body Weight: 125 lbs  % Ideal Body Weight: 184%  Wt Readings from Last 10 Encounters:  09/10/12 230 lb 2.6 oz (104.4 kg)  05/15/12 213 lb 12.8 oz (96.979 kg)  04/21/12 226 lb 12.8 oz (102.876 kg)  03/12/12 241 lb 13.5 oz (109.7 kg)   07/07/10 252 lb 3.2 oz (114.397 kg)    Usual Body Weight: 250 lbs  % Usual Body Weight: 92%  BMI:  Body mass index is 37.7 kg/(m^2).  Estimated Nutritional Needs: Kcal: 1880-2300 Protein: 90-105g Fluid: ~2.0 L/day  Skin: intactm, non-pitting edema  Diet Order: Clear Liquid  EDUCATION NEEDS: -No education needs identified at this time   Intake/Output Summary (Last 24 hours) at 09/10/12 0916 Last data filed at 09/10/12 0800  Gross per 24 hour  Intake   1250 ml  Output      0 ml  Net   1250 ml    Last BM: 7/9, diarrhea  Labs:   Recent Labs Lab 09/09/12 1610 09/09/12 2315 09/10/12 0340  NA 128*  --  132*  K 3.6  --  3.9  CL 88*  --  96  CO2 26  --  24  BUN 63*  --  61*  CREATININE 2.56* 2.31* 2.47*  CALCIUM 8.2*  --  7.6*  GLUCOSE 144*  --  112*    CBG (last 3)   Recent Labs  09/10/12 0116 09/10/12 0747  GLUCAP 88 138*    Scheduled Meds: . ipratropium  0.5 mg Nebulization TID   And  . albuterol  2.5 mg Nebulization TID  . antiseptic oral rinse  15 mL Mouth Rinse QID  . aspirin  81 mg Oral Daily  . gabapentin  100 mg Oral TID  . heparin  5,000 Units Subcutaneous Q8H  . insulin aspart  0-5 Units Subcutaneous QHS  . insulin aspart  0-9 Units Subcutaneous TID WC  . insulin glargine  16 Units Subcutaneous QHS  . isosorbide mononitrate  30 mg Oral Daily  . metronidazole  500 mg Intravenous Q8H  . pantoprazole  40 mg Oral Daily  . rOPINIRole  1 mg Oral QHS  . saccharomyces boulardii  250 mg Oral BID  . sertraline  50 mg Oral Daily  . sodium chloride  3 mL Intravenous Q12H  . vancomycin  125 mg Oral QID    Continuous Infusions: . dextrose 5 % and 0.9 % NaCl with KCl 40 mEq/L 125 mL/hr at 09/10/12 0800    Past Medical History  Diagnosis Date  . COPD (chronic obstructive pulmonary disease)   . Respiratory failure   . Morbid obesity   . Tobacco abuse   . GERD (gastroesophageal reflux disease)   . Hyperlipidemia   . Chronic pain   .  Hypertension   . Heart attack   . Arthritis   . Allergic rhinitis   . Diabetes mellitus, type 2   . Diabetes mellitus type 2 in obese 02/29/2012  . HTN (hypertension) 02/29/2012    Past Surgical History  Procedure Laterality Date  . Cholecystectomy    . Partial hysterectomy    . Angioplasty    . Umbilical hernia repair  12/2009    Loyce Dys, MS RD LDN Clinical Inpatient Dietitian Pager: [phone] Weekend/After hours pager: [phone]

## 2012-09-11 ENCOUNTER — Inpatient Hospital Stay (HOSPITAL_COMMUNITY): Payer: Medicare Other

## 2012-09-11 DIAGNOSIS — R6520 Severe sepsis without septic shock: Secondary | ICD-10-CM

## 2012-09-11 DIAGNOSIS — J9600 Acute respiratory failure, unspecified whether with hypoxia or hypercapnia: Secondary | ICD-10-CM

## 2012-09-11 DIAGNOSIS — A0472 Enterocolitis due to Clostridium difficile, not specified as recurrent: Secondary | ICD-10-CM

## 2012-09-11 DIAGNOSIS — R6521 Severe sepsis with septic shock: Secondary | ICD-10-CM

## 2012-09-11 DIAGNOSIS — A419 Sepsis, unspecified organism: Secondary | ICD-10-CM

## 2012-09-11 LAB — URINE CULTURE
Colony Count: NO GROWTH
Colony Count: NO GROWTH

## 2012-09-11 LAB — TROPONIN I
Troponin I: <0.3 ng/mL (ref <0.30)
Troponin I: <0.3 ng/mL (ref <0.30)

## 2012-09-11 LAB — GLUCOSE, CAPILLARY
Glucose-Capillary: 103 mg/dL — ABNORMAL HIGH (ref 70–99)
Glucose-Capillary: 115 mg/dL — ABNORMAL HIGH (ref 70–99)

## 2012-09-11 MED ORDER — VITAL AF 1.2 CAL PO LIQD
1000.0000 mL | ORAL | Status: DC
  Administered 2012-09-11: 1000 mL
  Filled 2012-09-11 (×3): qty 1000

## 2012-09-11 MED ORDER — BIOTENE DRY MOUTH MT LIQD
15.0000 mL | Freq: Four times a day (QID) | OROMUCOSAL | Status: DC
  Administered 2012-09-11 – 2012-09-12 (×6): 15 mL via OROMUCOSAL

## 2012-09-11 MED ORDER — CHLORHEXIDINE GLUCONATE 0.12 % MT SOLN
15.0000 mL | Freq: Two times a day (BID) | OROMUCOSAL | Status: DC
  Administered 2012-09-11 (×2): 15 mL via OROMUCOSAL
  Filled 2012-09-11 (×2): qty 15

## 2012-09-11 MED ORDER — PRO-STAT SUGAR FREE PO LIQD
30.0000 mL | Freq: Three times a day (TID) | ORAL | Status: DC
  Administered 2012-09-11 (×2): 30 mL
  Filled 2012-09-11 (×7): qty 30

## 2012-09-11 MED ORDER — FAMOTIDINE IN NACL 20-0.9 MG/50ML-% IV SOLN
20.0000 mg | INTRAVENOUS | Status: DC
  Administered 2012-09-11: 20 mg via INTRAVENOUS
  Filled 2012-09-11: qty 50

## 2012-09-11 NOTE — Progress Notes (Signed)
NUTRITION FOLLOW UP  Intervention:   1.  Enteral nutrition; initiate Vital 1.2 @ 30 mL/hr continuous goal with Prostat 4 times daily to provide 1264 kcal, 114g protein, and 583 mL free water.  Nutrition Dx:   Unintended wt loss, ongoing New dx:  Inadequate oral intake r/t inability to eat AEB mechanical ventilation  Monitor:   1. Food/Beverage; pt meeting >/=90% estimated needs with tolerance.  No longer appropriate, pt NPO 2. Wt/wt change; monitor trends.  Ongoing. 3.  Enteral nutrition; Enteral nutrition to provide 60-70% of estimated calorie needs (22-25 kcals/kg ideal body weight) and 100% of estimated protein needs, based on ASPEN guidelines for permissive underfeeding in critically ill obese individuals.  Assessment:   Pt admitted with diarrhea and weakness.  Pt unable to sustain Bi-pap, now intubated. Patient is currently intubated on ventilator support.  MV: 10.0 L/min  Temp:Temp (24hrs), Avg:97.7 F (36.5 C), Min:96.8 F (36 C), Max:99.3 F (37.4 C)  Pt is awake and alert of vent; able to answer questions.  Pt states she was eating per her usual- 3 meals + snacks with protein supplements PTA.  Pt reports no change in appetite.    Height: Ht Readings from Last 1 Encounters:  09/10/12 5' 5.5" (1.664 m)    Weight Status:   Wt Readings from Last 1 Encounters:  09/11/12 241 lb 10 oz (109.6 kg)    Re-estimated needs:  Kcal: 1960 Permissive underfeeding: 1170-1380 Protein: >/=113g Fluid: >1.8 L/day  Skin: intact  Diet Order: NPO   Intake/Output Summary (Last 24 hours) at 09/11/12 1222 Last data filed at 09/11/12 1100  Gross per 24 hour  Intake 5212.15 ml  Output   2150 ml  Net 3062.15 ml    Last BM: 7/10, diarrhea   Labs:   Recent Labs Lab 09/09/12 1610 09/09/12 2315 09/10/12 0340 09/10/12 1614  NA 128*  --  132* 136  K 3.6  --  3.9 4.2  CL 88*  --  96 103  CO2 26  --  24 22  BUN 63*  --  61* 56*  CREATININE 2.56* 2.31* 2.47* 2.05*  CALCIUM  8.2*  --  7.6* 7.6*  GLUCOSE 144*  --  112* 117*    CBG (last 3)   Recent Labs  09/10/12 1947 09/10/12 2337 09/11/12 0908  GLUCAP 115* 157* 129*    Scheduled Meds: . ipratropium  0.5 mg Nebulization Q6H WA   And  . albuterol  2.5 mg Nebulization Q6H  . antiseptic oral rinse  15 mL Mouth Rinse QID  . chlorhexidine  15 mL Mouth Rinse BID  . famotidine (PEPCID) IV  20 mg Intravenous Q24H  . heparin  5,000 Units Subcutaneous Q8H  . insulin aspart  0-9 Units Subcutaneous Q4H  . metronidazole  500 mg Intravenous Q8H  . sodium chloride  3 mL Intravenous Q12H  . vancomycin  500 mg Per Tube Q6H  . vancomycin (VANCOCIN) rectal ENEMA  500 mg Rectal Q6H    Continuous Infusions: . sodium chloride 100 mL/hr at 09/11/12 0852  . sodium chloride Stopped (09/11/12 0000)  . norepinephrine (LEVOPHED) Adult infusion Stopped (09/11/12 0900)  . vasopressin (PITRESSIN) infusion - *FOR SHOCK* Stopped (09/11/12 0215)    Loyce Dys, MS RD LDN Clinical Inpatient Dietitian Pager: [phone] Weekend/After hours pager: [phone]

## 2012-09-11 NOTE — Progress Notes (Signed)
PULMONARY  / CRITICAL CARE MEDICINE  Name: [name] MRN: [id number] DOB: 05-16-1955    ADMISSION DATE:  09/09/2012 CONSULTATION DATE:  09/10/12  REFERRING MD :  [facility] PRIMARY SERVICE: PCCM  CHIEF COMPLAINT:  Septic shock secondary to C. Difficile    BRIEF: 57 y.o. Obese white female with PMH of COPD(baseline 3L [place]) diabetes, obesity and CAD was admitted by [facility] [place] for 2 months of diarrhea.  Patient had a recent admission to [facility] for ventilator weaning and rehab.  Patient was diagnosed with C. Diff while in the hospital and received a full course of treatment but states that she has continued to have diarrhea since she was discharged home.  Patient presented to ED at urging of PCP and was found to be severely hypotensive with elevated SCr.  TRH treated with IV hydration for hypotension and AKI.  Despite IV hydration patient remained severely hypotensive, and subsequently became dyspneic and was put on bipap therapy.  PCCM initiated sepsis protocol, intubated the patient, and placed a central venous line on 7/914  s  LINES / TUBES: 7/09 R IJ >>> 7/09 ETT >>>  CULTURES: 7/09 Blood >> 7/09 Urine >> Results for orders placed during the hospital encounter of 09/09/12  CLOSTRIDIUM DIFFICILE BY PCR     Status: Abnormal   Collection Time    09/09/12  4:15 PM      Result Value Range Status   C difficile by pcr POSITIVE (*) NEGATIVE Final   Comment: CRITICAL RESULT CALLED TO, READ BACK BY AND VERIFIED WITH:     L.YOUNG RN 0309 09/10/12 E.GADDY  MRSA PCR SCREENING     Status: None   Collection Time    09/09/12 11:11 PM      Result Value Range Status   MRSA by PCR NEGATIVE  NEGATIVE Final   Comment:            The GeneXpert MRSA Assay (FDA     approved for NASAL specimens     only), is one component of a     comprehensive MRSA colonization     surveillance program. It is not     intended to diagnose MRSA     infection nor to guide or     monitor treatment for     MRSA infections.      ANTIBIOTICS: 7/09 Vanc (enteral) >>> 7/09 Vanc enema >>> 7/09 metronidazole >>>   SIGNIFICANT EVENTS / STUDIES:  07/2012 C. Difficile 07/2012 d/c from [facility] ----------------------------------------------- 7/08 Admitted to [facility] 7/09 Failed Bipap therapy, intubated 7/09 Hypotensive episode requiring pressor   SUBJECTIVE:   09/11/12:  On levophed , On prn sedation. Meet spontaneous breathing trial criteria although the fact she is on pressors. Still with diarrhea but mostly when she gets enema  VITAL SIGNS: Temp:  [97.3 F (36.3 C)-99.3 F (37.4 C)] 97.5 F (36.4 C) (07/10 0400) Pulse Rate:  [56-109] 61 (07/10 0700) Resp:  [10-28] 28 (07/10 0700) BP: (83-156)/(33-75) 114/49 mmHg (07/10 0700) SpO2:  [90 %-100 %] 98 % (07/10 0700) Arterial Line BP: (85-173)/(36-88) 91/87 mmHg (07/10 0700) FiO2 (%):  [30 %-40 %] 40 % (07/10 0400) Weight:  [109.6 kg (241 lb 10 oz)] 109.6 kg (241 lb 10 oz) (07/10 0600) HEMODYNAMICS: CVP:  [9 mmHg-19 mmHg] 11 mmHg VENTILATOR SETTINGS: Vent Mode:  [-] PRVC FiO2 (%):  [30 %-40 %] 40 % Set Rate:  [14 bmp-28 bmp] 28 bmp Vt Set:  [460 mL] 460 mL PEEP:  [5 cmH20] 5 cmH20  Plateau Pressure:  [18 cmH20-23 cmH20] 18 cmH20 INTAKE / OUTPUT: Intake/Output     07/09 0701 - 07/10 0700 07/10 0701 - 07/11 0700   I.V. (mL/kg) 5112.3 (46.6)    NG/GT 40    IV Piggyback 300    Total Intake(mL/kg) 5452.3 (49.7)    Urine (mL/kg/hr) 2015 (0.8)    Total Output 2015     Net +3437.3            PHYSICAL EXAMINATION: General:  Obese, a critically ill looking female on ventilator in the bed but looking comfortable  Neuro: On as needed sedation: Alert and oriented x3. CAM-ICU negative for delirium. RASS sedation score 0 HEENT:  MM dry and pink, no noted JVD, no carotid bruits. 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  Aeration has improved slightly with mild basilar atelectasis remaining.  The right central venous line is unchanged in position and cardiomegaly is stable.  IMPRESSION: Endotracheal tube tip 2.9 cm above carina.  Slightly improved aeration.   Original Report Authenticated By: Dwyane Dee, M.D.   Dg Chest Portable 1 View  09/10/2012   *RADIOLOGY REPORT*  Clinical Data: Sepsis, central line placement  PORTABLE CHEST - 1 VIEW  Comparison: Portable exam 1457 hours compared to 09/09/2012  Findings: Tip of endotracheal tube approximately 2.5 cm above carina. Nasogastric tube extends into stomach. Right jugular central venous catheter tip projects over SVC. Enlargement of cardiac silhouette with pulmonary vascular congestion. Mild diffuse bilateral interstitial infiltrates question pulmonary edema. Mild basilar hypoinflation. No gross pleural effusion or pneumothorax.  IMPRESSION: No pneumothorax following central line placement. Probable mild pulmonary edema and bibasilar atelectasis.   Original Report Authenticated By: Ulyses Southward, M.D.   Dg Chest Portable 1 View  09/09/2012   *RADIOLOGY REPORT*  Clinical Data: 56 year old female with respiratory failure. Diarrhea.  PORTABLE CHEST - 1 VIEW  Comparison: 04/08/2012 and earlier.  Findings: Semi upright AP portable view 1750 hours.  Stable lung volumes.  Stable cardiac size and mediastinal contours.  No pneumothorax or pulmonary edema.  No pleural effusion or confluent pulmonary opacity.  Stable mild contour deformity associated with a small fat containing right  diaphragmatic hernia (see 03/24/2012 CTA).  IMPRESSION: No acute cardiopulmonary abnormality.   Original Report Authenticated By: Erskine Speed, M.D.   Dg Abd Portable 1v  09/10/2012   *RADIOLOGY REPORT*  Clinical Data: Diarrhea, evaluate for possible colonic distention  PORTABLE ABDOMEN - 1 VIEW  Comparison: Abdomen films of 05/03/2012  Findings: A supine film of the abdomen does show apparent edema within the mucosa of the right colon and transverse colon with some "thumbprinting".  However, no bowel distention is seen.  No opaque calculi are noted.  Surgical clips are present in the right upper quadrant from prior cholecystectomy with clips in the left lower quadrant and mid pelvis as well probably due to prior bilateral tubal ligation.  IMPRESSION:  1.  No bowel distention. 2.  Edema  of the mucosa of the colon consistent with colitis.   Original Report Authenticated By: Dwyane Dee, M.D.    ASSESSMENT / PLAN:  PULMONARY  Recent Labs Lab 09/10/12 1006 09/10/12 1215 09/10/12 1615 09/10/12 1616 09/10/12 1735 09/10/12 1918  PHART 7.208* 7.228*  --  7.181* 7.204* 7.200*  PCO2ART 65.2* 58.4*  --  61.2* 49.5* 46.8*  PO2ART 79.0* 104.0*  --  86.0 79.0* 100.0  HCO3 26.0* 23.5  --  23.0 19.7* 18.3*  TCO2 28 25.3  --  25 21 20   O2SAT 92.0 96.4 81.2 93.0 93.0 96.0    A: Acute on Chronic Hypercarbic Respiratory failure. Status post chronic critical illness in May 2014 with prior tracheostomy COPD H/O ARDS/H1N1 09/11/2012: Meets spontaneous breathing trial and extubation criteria other than the fact she is on vasopressor support  P:   -Spontaneous breathing trial and cuff leak test to be done 09/11/2012 - Reassess for extubation 09/11/2012 but will probably hold off because she is still on vasopressors   CARDIOVASCULAR No results found for this basename: PROBNP,  in the last 168 hours   Recent Labs Lab 09/10/12 1614  TROPONINI <0.30    A:  Septic Shock due to C. difficile H/o  hyperlipidemia H/o hypertension  09/11/2012: Status post septic shock protocol ending today. Currently on levophed at  P:  -Levo as needed, goal MAP >65   RENAL  Recent Labs Lab 09/09/12 1610 09/09/12 2315 09/10/12 0340 09/10/12 1614  NA 128*  --  132* 136  K 3.6  --  3.9 4.2  CL 88*  --  96 103  CO2 26  --  24 22  GLUCOSE 144*  --  112* 117*  BUN 63*  --  61* 56*  CREATININE 2.56* 2.31* 2.47* 2.05*  CALCIUM 8.2*  --  7.6* 7.6*   Intake/Output     07/09 0701 - 07/10 0700 07/10 0701 - 07/11 0700   I.V. (mL/kg) 5112.3 (46.6)    NG/GT 40    IV Piggyback 300    Total Intake(mL/kg) 5452.3 (49.7)    Urine (mL/kg/hr) 2015 (0.8)    Total Output 2015     Net +3437.3            A:   AKI- baseline SCr 0.9 Hyponatremia  09/11/2012: She is in acute renal failure since admission but this is improving. Probably related volume loss and sepsis  P:   -NS @100  mL/hr -hold lasix until BP stable -Bmet in am  GASTROINTESTINAL  Recent Labs Lab 09/09/12 1610 09/10/12 0340 09/10/12 1614  AST 15 14 13   ALT 7 8 9   ALKPHOS 86 55 49  BILITOT 0.1* 0.1* 0.2*  PROT 5.2* 5.1* 5.0*  ALBUMIN 1.9* 1.9* 1.9*  INR  --   --  1.06    Recent Labs Lab 09/09/12 1610 09/09/12 2315 09/10/12 0340 09/10/12 1614  WBC 16.0* 14.6* 13.2* 14.1*     A:   C. Difficile Enterocolitis GERD  09/11/2012: Diarrhea present but only when she gets an enema. Clinically she is improving P:   -Start tube feeds -Famotidine for SUP -See ID for abx  HEMATOLOGIC  Recent Labs Lab 09/09/12 2315 09/10/12 0340 09/10/12 1614  HGB 11.2* 10.9* 10.9*  HCT 34.4* 34.6* 35.0*  WBC 14.6* 13.2* 14.1*  PLT 273 270 298    Recent Labs Lab 09/10/12 1614  INR 1.06    A:   Anemia of chronic disease and critical illness P:  --Subq heparin for DVT  prophylaxis - Pack red blood cells only for hemoglobin less than 7 g percent unless actively bleeding   INFECTIOUS  Recent Labs Lab  09/09/12 1756 09/10/12 1619  LATICACIDVEN 0.54 0.3*    Recent Labs Lab 09/09/12 1610 09/09/12 2315 09/10/12 0340 09/10/12 1614  WBC 16.0* 14.6* 13.2* 14.1*    A:   Septic shock secondary to C. Diff enterocolitis  09/11/2012: Appears to be improving P:   -Vanc per tube, and per rectum -IV flagyl -monitor fever curve and WBCs trend -tylenol prn for fever  ENDOCRINE CBG (last 3)   Recent Labs  09/10/12 0116 09/10/12 0747 09/10/12 1214  GLUCAP 88 138* 135*     A:   DM II R/o Adrenal Insufficiency  P: -SSI -cortisol level now -consider stress steroids   NEUROLOGIC A:   Acute encephalopathy Chronic pain Chronic CNS meds: On home gabapentin, hydrocodone/acetaminophen, ropinirole, Zoloft and Ambien  - 09/11/2012: Normal mental status and not in pain  P:   -intermittent sedation protocol -Daily WUA -Fentanyl push prn for pain - Restart antidepressants as soon as possible     GLOBAL:   09/11/12: Family not at bedside  CRITICAL CARE: The patient is critically ill with multiple organ systems failure and requires high complexity decision making for assessment and support, frequent evaluation and titration of therapies, application of advanced monitoring technologies and extensive interpretation of multiple databases. Critical Care Time devoted to patient care services described in this note is 40 minutes.      Dr. Kalman Shan, M.D., [facility].C.P Pulmonary and Critical Care Medicine Staff Physician [facility] [place] Pulmonary and Critical Care Pager: [phone], If no answer or between  15:00h - 7:00h: call [phone]  09/11/2012 8:46 AM

## 2012-09-11 NOTE — Progress Notes (Signed)
Pt does have + cuff leak when checked (per request in  Dr. Marchelle Gearing note)

## 2012-09-11 NOTE — Progress Notes (Signed)
Dr Marchelle Gearing at bedside, wants pt to wean on psv now.  RN states plans to wean Levophed.  MD states to start weaning now.  Pt tol well, no distress noted.  Sats 100%

## 2012-09-12 DIAGNOSIS — D649 Anemia, unspecified: Secondary | ICD-10-CM | POA: Diagnosis present

## 2012-09-12 LAB — BASIC METABOLIC PANEL
Calcium: 8.4 mg/dL (ref 8.4–10.5)
GFR calc Af Amer: 85 mL/min — ABNORMAL LOW (ref >90)
GFR calc non Af Amer: 73 mL/min — ABNORMAL LOW (ref >90)
Potassium: 3.8 mEq/L (ref 3.5–5.1)
Sodium: 143 mEq/L (ref 135–145)

## 2012-09-12 LAB — CBC
Hemoglobin: 10.3 g/dL — ABNORMAL LOW (ref 12.0–15.0)
RBC: 3.79 MIL/uL — ABNORMAL LOW (ref 3.87–5.11)

## 2012-09-12 LAB — GLUCOSE, CAPILLARY
Glucose-Capillary: 161 mg/dL — ABNORMAL HIGH (ref 70–99)
Glucose-Capillary: 181 mg/dL — ABNORMAL HIGH (ref 70–99)

## 2012-09-12 LAB — MAGNESIUM: Magnesium: 1.7 mg/dL (ref 1.5–2.5)

## 2012-09-12 LAB — PHOSPHORUS: Phosphorus: 2.6 mg/dL (ref 2.3–4.6)

## 2012-09-12 MED ORDER — IPRATROPIUM BROMIDE 0.02 % IN SOLN
0.5000 mg | Freq: Three times a day (TID) | RESPIRATORY_TRACT | Status: DC
  Administered 2012-09-12 – 2012-09-13 (×2): 0.5 mg via RESPIRATORY_TRACT
  Filled 2012-09-12 (×2): qty 2.5

## 2012-09-12 MED ORDER — ALBUTEROL SULFATE (5 MG/ML) 0.5% IN NEBU
2.5000 mg | INHALATION_SOLUTION | RESPIRATORY_TRACT | Status: DC | PRN
  Administered 2012-09-13 – 2012-09-15 (×2): 2.5 mg via RESPIRATORY_TRACT
  Filled 2012-09-12 (×3): qty 0.5

## 2012-09-12 MED ORDER — FAMOTIDINE 20 MG PO TABS
20.0000 mg | ORAL_TABLET | Freq: Every day | ORAL | Status: DC
  Administered 2012-09-12 – 2012-09-15 (×4): 20 mg via ORAL
  Filled 2012-09-12 (×6): qty 1

## 2012-09-12 MED ORDER — FIDAXOMICIN 200 MG PO TABS
200.0000 mg | ORAL_TABLET | Freq: Two times a day (BID) | ORAL | Status: DC
  Administered 2012-09-12: 200 mg via ORAL
  Filled 2012-09-12 (×3): qty 1

## 2012-09-12 MED ORDER — INSULIN ASPART 100 UNIT/ML ~~LOC~~ SOLN
0.0000 [IU] | Freq: Three times a day (TID) | SUBCUTANEOUS | Status: DC
  Administered 2012-09-12 – 2012-09-13 (×2): 3 [IU] via SUBCUTANEOUS
  Administered 2012-09-13: 4 [IU] via SUBCUTANEOUS
  Administered 2012-09-13: 3 [IU] via SUBCUTANEOUS
  Administered 2012-09-14: 7 [IU] via SUBCUTANEOUS
  Administered 2012-09-14: 4 [IU] via SUBCUTANEOUS
  Administered 2012-09-14: 3 [IU] via SUBCUTANEOUS
  Administered 2012-09-15: 4 [IU] via SUBCUTANEOUS
  Administered 2012-09-15: 3 [IU] via SUBCUTANEOUS
  Administered 2012-09-15 – 2012-09-16 (×3): 4 [IU] via SUBCUTANEOUS

## 2012-09-12 MED ORDER — VANCOMYCIN 50 MG/ML ORAL SOLUTION
125.0000 mg | Freq: Four times a day (QID) | ORAL | Status: DC
  Administered 2012-09-12 – 2012-09-16 (×16): 125 mg via ORAL
  Filled 2012-09-12 (×21): qty 2.5

## 2012-09-12 MED ORDER — ROPINIROLE HCL 1 MG PO TABS
1.0000 mg | ORAL_TABLET | Freq: Every day | ORAL | Status: DC
  Administered 2012-09-12 – 2012-09-15 (×4): 1 mg via ORAL
  Filled 2012-09-12 (×5): qty 1

## 2012-09-12 MED ORDER — MAGNESIUM SULFATE IN D5W 10-5 MG/ML-% IV SOLN
1.0000 g | Freq: Once | INTRAVENOUS | Status: AC
  Administered 2012-09-12: 1 g via INTRAVENOUS
  Filled 2012-09-12: qty 100

## 2012-09-12 MED ORDER — ALBUTEROL SULFATE (5 MG/ML) 0.5% IN NEBU
2.5000 mg | INHALATION_SOLUTION | Freq: Three times a day (TID) | RESPIRATORY_TRACT | Status: DC
  Administered 2012-09-12 – 2012-09-13 (×2): 2.5 mg via RESPIRATORY_TRACT
  Filled 2012-09-12 (×2): qty 0.5

## 2012-09-12 NOTE — Evaluation (Signed)
Physical Therapy Evaluation Patient Details Name: [name] MRN: [id number] DOB: Feb 16, 1956 Today's Date: 09/12/2012 Time: 7846-9629 PT Time Calculation (min): 20 min  PT Assessment / Plan / Recommendation History of Present Illness  Patient is a 57 yo female admitted in December with influenza and pna.  At end of admission, with C-dfif.  Intubated and with trach.  To [facility] - recently discharged home.  Now admitted with persistent diarrhea and septic shock - intubated.  Patient now extubated today (09/12/12).  Clinical Impression  Patient presents with problems listed below.  Patient will benefit from acute PT to maximize independence prior to return home with husband.    PT Assessment  Patient needs continued PT services    Follow Up Recommendations  Home health PT;Supervision/Assistance - 24 hour    Does the patient have the potential to tolerate intense rehabilitation      Barriers to Discharge        Equipment Recommendations  None recommended by PT    Recommendations for Other Services     Frequency Min 3X/week    Precautions / Restrictions Precautions Precautions: Fall Restrictions Weight Bearing Restrictions: No   Pertinent Vitals/Pain       Mobility  Bed Mobility Bed Mobility: Sit to Supine Sit to Supine: 4: Min assist;HOB flat Details for Bed Mobility Assistance: Verbal cues for technique.  Assist to bring LE's onto bed. Transfers Transfers: Sit to Stand;Stand to Sit Sit to Stand: 4: Min guard;With upper extremity assist;From bed Stand to Sit: 4: Min guard;With upper extremity assist;To bed Details for Transfer Assistance: Verbal cues for hand placement.  Assist for safety. Ambulation/Gait Ambulation/Gait Assistance: 4: Min guard Ambulation Distance (Feet): 124 Feet Assistive device: Rolling walker Ambulation/Gait Assistance Details: Verbal cues to stand upright, look up, and keep RW close to body for safety.   Gait Pattern: Step-through  pattern;Decreased stride length;Trunk flexed Gait velocity: Slow gait speed General Gait Details: O2 sats remained in 90's during gait on O2 at 3 l/min.    Exercises     PT Diagnosis: Difficulty walking;Abnormality of gait;Generalized weakness  PT Problem List: Decreased strength;Decreased activity tolerance;Decreased balance;Decreased mobility;Decreased knowledge of use of DME;Cardiopulmonary status limiting activity PT Treatment Interventions: DME instruction;Gait training;Stair training;Functional mobility training;Patient/family education     PT Goals(Current goals can be found in the care plan section) Acute Rehab PT Goals Patient Stated Goal: To return home PT Goal Formulation: With patient Time For Goal Achievement: 09/19/12 Potential to Achieve Goals: Good  Visit Information  Last PT Received On: 09/12/12 Assistance Needed: +1 History of Present Illness: Patient is a 57 yo female admitted in December with influenza and pna.  At end of admission, with C-dfif.  Intubated and with trach.  To [facility] - recently discharged home.  Now admitted with persistent diarrhea and septic shock - intubated.  Patient now extubated today (09/12/12).       Prior Functioning  Home Living Family/patient expects to be discharged to:: Private residence Living Arrangements: Spouse/significant other Available Help at Discharge: Family;Available 24 hours/day Type of Home: House Home Access: Stairs to enter [employer] of Steps: 1 Entrance Stairs-Rails: None Home Equipment: Walker - 2 wheels;Bedside commode;Shower seat Prior Function Level of Independence: Independent with assistive device(s) Communication Communication: No difficulties    Cognition  Cognition Arousal/Alertness: Awake/alert Behavior During Therapy: WFL for tasks assessed/performed Overall Cognitive Status: Within Functional Limits for tasks assessed    Extremity/Trunk Assessment Upper Extremity  Assessment Upper Extremity Assessment: Generalized weakness Lower  Extremity Assessment Lower Extremity Assessment: Generalized weakness   Balance    End of Session PT - End of Session Equipment Utilized During Treatment: Gait belt;Oxygen Activity Tolerance: Patient limited by fatigue Patient left: in bed;with call bell/phone within reach Nurse Communication: Mobility status  GP     Vena Austria 09/12/2012, 4:39 PM Durenda Hurt. Renaldo Fiddler, [facility] Acute Rehab Services Pager [phone]

## 2012-09-12 NOTE — Progress Notes (Signed)
PULMONARY  / CRITICAL CARE MEDICINE  Name: SHENETTA SCHNACKENBERG MRN: [id number] DOB: June 30, 1955    ADMISSION DATE:  09/09/2012 CONSULTATION DATE:  09/10/12  REFERRING MD :  [facility] PRIMARY SERVICE: PCCM  CHIEF COMPLAINT:  Septic shock secondary to C. Difficile    BRIEF: 57 y.o. Obese white female with PMH of COPD(baseline 3L [place]) diabetes, obesity and CAD was admitted by [facility] for 2 months of diarrhea.  Patient had a recent admission to [facility] for ventilator weaning and rehab.  Patient was diagnosed with C. Diff while in the hospital and received a full course of treatment but states that she has continued to have diarrhea since she was discharged home.  Patient presented to ED at urging of PCP and was found to be severely hypotensive with elevated SCr.  TRH treated with IV hydration for hypotension and AKI.  Despite IV hydration patient remained severely hypotensive, and subsequently became dyspneic and was put on bipap therapy.  PCCM initiated sepsis protocol, intubated the patient, and placed a central venous line on 7/914  s  LINES / TUBES: 7/09 R IJ >>> 7/09 ETT >>>  CULTURES: 09/09/2012 Clostridium difficile PCR is positive [also positive 03/20/2012 and 04/04/2012] 7/09 Blood >> negative 7/09 Urine >> negative    ANTIBIOTICS: 7/09 Vanc (enteral) >>> 09/12/2012 7/09 Vanc enema >>> 09/12/2012 7/09 metronidazole >>> 09/12/2012 ...............Marland Kitchen DIFICID 09/12/2012  >> [for 10 days, indication is relapsing C. difficile]   SIGNIFICANT EVENTS / STUDIES:  07/2012 C. Difficile 07/2012 d/c from [facility] ----------------------------------------------- 7/08 Admitted to [facility] 7/09 Failed Bipap therapy, intubated 7/09 Hypotensive episode requiring pressor  09/11/12:  On levophed , On prn sedation. Meet spontaneous breathing trial criteria although the fact she is on pressors. Still with diarrhea but mostly when she gets enema    SUBJECTIVE/OVERNIGHT/INTERVAL HX 09/12/2012: Diarrhea  only with enema. Nontoxic. Off pressors. Meets extubation criteria.  VITAL SIGNS: Temp:  [96.8 F (36 C)-98.7 F (37.1 C)] 97.9 F (36.6 C) (07/11 0800) Pulse Rate:  [67-96] 79 (07/11 0800) Resp:  [12-30] 20 (07/11 0800) BP: (80-128)/(15-77) 111/49 mmHg (07/11 0800) SpO2:  [82 %-100 %] 99 % (07/11 0800) Arterial Line BP: (58-132)/(38-74) 127/60 mmHg (07/11 0800) FiO2 (%):  [40 %] 40 % (07/11 0800) Weight:  [109.7 kg (241 lb 13.5 oz)] 109.7 kg (241 lb 13.5 oz) (07/11 0500) HEMODYNAMICS: CVP:  [11 mmHg-13 mmHg] 11 mmHg VENTILATOR SETTINGS: Vent Mode:  [-] CPAP FiO2 (%):  [40 %] 40 % Set Rate:  [28 bmp] 28 bmp Vt Set:  [460 mL] 460 mL PEEP:  [5 cmH20] 5 cmH20 Pressure Support:  [5 cmH20] 5 cmH20 Plateau Pressure:  [23 cmH20-25 cmH20] 23 cmH20 INTAKE / OUTPUT: Intake/Output     07/10 0701 - 07/11 0700 07/11 0701 - 07/12 0700   I.V. (mL/kg) 2596.1 (23.7) 100 (0.9)   NG/GT 446 30   IV Piggyback 350    Total Intake(mL/kg) 3392.1 (30.9) 130 (1.2)   Urine (mL/kg/hr) 2755 (1)    Total Output 2755     Net +637.1 +130        Stool Occurrence 2 x      PHYSICAL EXAMINATION: General:  Obese, female on ventilator in the bed looking comfortable  Neuro: On as needed sedation: Alert and oriented x3. CAM-ICU negative for delirium. RASS sedation score 0 HEENT:  MM dry and pink, no noted JVD, no carotid bruits. 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 Cuff leak test positive Cardiovascular:  RRR, no M/R/G Lungs:  Diminished breath sounds  at bases, no wheezes Abdomen:  Soft, nontender with normal bowel sounds  Musculoskeletal:  No gross deformities, moves extremities x4 Skin:  No obvious breakdowns noted  LABS: - see indivdiaula A and P   IMAGING x 48h Portable Chest Xray In Am  09/11/2012   *RADIOLOGY REPORT*  Clinical Data: Endotracheal tube placement, shortness of breath  PORTABLE CHEST - 1 VIEW  Comparison: Portable chest x-ray of 09/10/2012  Findings: The tip of the endotracheal tube is  approximately 2.9 cm above the carina.  Aeration has improved slightly with mild basilar atelectasis remaining.  The right central venous line is unchanged in position and cardiomegaly is stable.  IMPRESSION: Endotracheal tube tip 2.9 cm above carina.  Slightly improved aeration.   Original Report Authenticated By: Dwyane Dee, M.D.   Dg Chest Portable 1 View  09/10/2012   *RADIOLOGY REPORT*  Clinical Data: Sepsis, central line placement  PORTABLE CHEST - 1 VIEW  Comparison: Portable exam 1457 hours compared to 09/09/2012  Findings: Tip of endotracheal tube approximately 2.5 cm above carina. Nasogastric tube extends into stomach. Right jugular central venous catheter tip projects over SVC. Enlargement of cardiac silhouette with pulmonary vascular congestion. Mild diffuse bilateral interstitial infiltrates question pulmonary edema. Mild basilar hypoinflation. No gross pleural effusion or pneumothorax.  IMPRESSION: No pneumothorax following central line placement. Probable mild pulmonary edema and bibasilar atelectasis.   Original Report Authenticated By: Ulyses Southward, M.D.    ASSESSMENT / PLAN:  PULMONARY  Recent Labs Lab 09/10/12 1006 09/10/12 1215 09/10/12 1615 09/10/12 1616 09/10/12 1735 09/10/12 1918  PHART 7.208* 7.228*  --  7.181* 7.204* 7.200*  PCO2ART 65.2* 58.4*  --  61.2* 49.5* 46.8*  PO2ART 79.0* 104.0*  --  86.0 79.0* 100.0  HCO3 26.0* 23.5  --  23.0 19.7* 18.3*  TCO2 28 25.3  --  25 21 20   O2SAT 92.0 96.4 81.2 93.0 93.0 96.0    A: Acute on Chronic Hypercarbic Respiratory failure. Status post chronic critical illness in May 2014 with prior tracheostomy COPD H/O ARDS/H1N1  09/12/2012: Meets extubation criteria P:   -extubate after stopping tube feeds for 1-2 hours   CARDIOVASCULAR No results found for this basename: PROBNP,  in the last 168 hours    Recent Labs Lab 09/10/12 1614 09/11/12 1005 09/11/12 1642 09/12/12 0044  TROPONINI <0.30 <0.30 <0.30 <0.30    A:   Septic Shock due to C. difficile H/o hyperlipidemia H/o hypertension  09/11/2012: Status post septic shock protocol ending today. Currently on levophed at 09/12/2012: Off pressors  P:  -DC septic shock protocol and change patient to saline lock  RENAL  Recent Labs Lab 09/09/12 1610 09/09/12 2315 09/10/12 0340 09/10/12 1614 09/12/12 0508  NA 128*  --  132* 136 143  K 3.6  --  3.9 4.2 3.8  CL 88*  --  96 103 110  CO2 26  --  24 22 24   GLUCOSE 144*  --  112* 117* 187*  BUN 63*  --  61* 56* 26*  CREATININE 2.56* 2.31* 2.47* 2.05* 0.86  CALCIUM 8.2*  --  7.6* 7.6* 8.4  MG  --   --   --   --  1.7  PHOS  --   --   --   --  2.6   Intake/Output     07/10 0701 - 07/11 0700 07/11 0701 - 07/12 0700   I.V. (mL/kg) 2596.1 (23.7) 100 (0.9)   NG/GT 446 30   IV Piggyback 350  Total Intake(mL/kg) 3392.1 (30.9) 130 (1.2)   Urine (mL/kg/hr) 2755 (1)    Total Output 2755     Net +637.1 +130        Stool Occurrence 2 x      A:   AKI- baseline SCr 0.9 Hyponatremia  09/12/2012: Acute renal failure has resolved. She is mild hypomagnesemia P:   -NS @100  mL/hr -hold lasix until BP stable -Bmet in am  GASTROINTESTINAL  Recent Labs Lab 09/09/12 1610 09/10/12 0340 09/10/12 1614  AST 15 14 13   ALT 7 8 9   ALKPHOS 86 55 49  BILITOT 0.1* 0.1* 0.2*  PROT 5.2* 5.1* 5.0*  ALBUMIN 1.9* 1.9* 1.9*  INR  --   --  1.06    Recent Labs Lab 09/09/12 1610 09/09/12 2315 09/10/12 0340 09/10/12 1614 09/12/12 0043  WBC 16.0* 14.6* 13.2* 14.1* 12.2*     A:   C. Difficile Enterocolitis GERD  09/12/2012: Diarrhea present but only when she gets an enema. Clinically she is improving P:   -dc tube feeds -Famotidine for SUP -See ID for abx  HEMATOLOGIC  Recent Labs Lab 09/10/12 0340 09/10/12 1614 09/12/12 0043  HGB 10.9* 10.9* 10.3*  HCT 34.6* 35.0* 32.5*  WBC 13.2* 14.1* 12.2*  PLT 270 298 300    Recent Labs Lab 09/10/12 1614  INR 1.06    A:  Anemia of  chronic disease and critical illness P:  --Subq heparin for DVT prophylaxis - Pack red blood cells only for hemoglobin less than 7 g percent unless actively bleeding   INFECTIOUS  Recent Labs Lab 09/09/12 1756 09/10/12 1619 09/12/12 0509  LATICACIDVEN 0.54 0.3* 0.8    Recent Labs Lab 09/09/12 1610 09/09/12 2315 09/10/12 0340 09/10/12 1614 09/12/12 0043  WBC 16.0* 14.6* 13.2* 14.1* 12.2*    A:   Septic shock secondary to recurrence  C. Diff enterocolitis  09/12/2012: Appears to be improving P:   -dc vanc, dc flagyl - dificid for 10 days starting 09/12/12  ENDOCRINE CBG (last 3)   Recent Labs  09/12/12 0026 09/12/12 0511 09/12/12 0827  GLUCAP 170* 161* 181*     A:   DM II R/o Adrenal Insufficiency  P: -SSI -   NEUROLOGIC A:   Acute encephalopathy Chronic pain Chronic CNS meds: On home gabapentin, hydrocodone/acetaminophen, ropinirole, Zoloft and Ambien  - 09/12/2012: Normal mental status and not in pain  P:   -dc sedation protocol - Restart antidepressants as soon as possible; on 09/13/12   GLOBAL:   09/11/12 and 09/12/12: Family not at bedside      Dr. Kalman Shan, M.D., [facility].C.P Pulmonary and Critical Care Medicine Staff Physician [facility] Pager: [phone], If no answer or between  15:00h - 7:00h: call [phone]  09/12/2012 8:57 AM

## 2012-09-12 NOTE — Procedures (Signed)
Extubation Procedure Note  Patient Details:   Name: [name] DOB: Jul 07, 1955 MRN: [id number]   Airway Documentation:     Evaluation  O2 sats: stable throughout Complications: No apparent complications Patient did tolerate procedure well. Bilateral Breath Sounds: Clear;Diminished Suctioning: Airway;Oral Yes pt able to speak.  + cuff leak.  No stridor noted, no distress noted.  BBSH clear diminished.  Jennette Kettle 09/12/2012, 10:55 AM

## 2012-09-12 NOTE — Consult Note (Addendum)
[facility]    Date of Admission:  09/09/2012    Total days of antibiotics 4        Day 1 fidaxomicin               Reason for Consult: Recurrent C. difficile colitis    Referring Physician: Dr. Kalman Shan  Principal Problem:   Recurrent Clostridium difficile diarrhea Active Problems:   COPD (chronic obstructive pulmonary disease)   Obesity   Hyperlipidemia   Diabetes mellitus type 2 in obese   Chronic pain   Hypertension   Type 2 diabetes mellitus with hyperglycemia   Acute renal failure   Severe sepsis with septic shock   Acute respiratory failure with hypoxia   Hypercapnia   Anemia   . ipratropium  0.5 mg Nebulization Q6H WA   And  . albuterol  2.5 mg Nebulization Q6H  . antiseptic oral rinse  15 mL Mouth Rinse QID  . chlorhexidine  15 mL Mouth Rinse BID  . famotidine  20 mg Oral QHS  . fidaxomicin  200 mg Oral BID  . heparin  5,000 Units Subcutaneous Q8H  . insulin aspart  0-20 Units Subcutaneous TID WC    Recommendations: 1. Change fidaxomicin to oral vancomycin 2. Educate patient about avoidance of antimotility agents in the near future and careful use of other systemic antibiotics 3. I will arrange followup in our clinic next week 4. Please call Dr. Enedina Finner [phone]) for any infectious disease questions this weekend   Assessment: [name] has recurrent C. difficile colitis. She had positive PCR is in January and again on this admission. I cannot find record of testing for C. difficile at other times. Given that she was treated repeatedly with metronidazole up until early May I am guessing that she has had several recurrences. Certainly, repeated exposure to ciprofloxacin and the use of antimotility agents are risk factors for recurrence and more severe disease. Treatment options at this point include oral vancomycin taper or fidaxomicin. Since she has not previously been on oral vancomycin it is unlikely that  we would be able to get authorization for fidaxomicin. I will change her to oral vancomycin 125 mg 4 times a day and then institute a slow tapering course as an outpatient. I will arrange followup in our clinic next week.   HPI: [name] is a 57 y.o. female who was hospitalized last December with influenza and probable secondary pneumococcal pneumonia. For the tail end of her admission she developed C. difficile colitis. It appears she was treated with oral metronidazole. She reports persistent diarrhea and her pharmacist confirms that she received for refills on her Flagyl on March 6, March 11, April 2 and May 2. She also received a prescription for ciprofloxacin on March 6, March 29 and April 21. She was admitted again on July 8 with recurrent diarrhea and found to be C. difficile positive again. She reports diarrhea for the past month and has been taking Lomotil. She was hypotensive, tachypneic and tachycardic on admission and was intubated. She is now extubated and feeling better. She was initially treated with oral vancomycin, vancomycin enemas and IV metronidazole. She was switched to oral fidaxomicin today.   Review of Systems: Constitutional: positive for malaise and she believes she lost 60 pounds when she was hospitalized earlier this year and has regained 20-30 pounds since then, negative for chills, fevers and sweats Eyes: negative Ears, nose,  mouth, throat, and face: negative Respiratory: positive for dyspnea on exertion, negative for cough, pleurisy/chest pain and sputum Cardiovascular: negative Gastrointestinal: positive for abdominal pain and diarrhea, negative for nausea and vomiting Genitourinary:negative  Past Medical History  Diagnosis Date  . COPD (chronic obstructive pulmonary disease)   . Respiratory failure   . Morbid obesity   . Tobacco abuse   . GERD (gastroesophageal reflux disease)   . Hyperlipidemia   . Chronic pain   . Hypertension   . Heart attack   .  Arthritis   . Allergic rhinitis   . Diabetes mellitus, type 2   . Diabetes mellitus type 2 in obese 02/29/2012  . HTN (hypertension) 02/29/2012    History  Substance Use Topics  . Smoking status: Former Smoker -- 1.00 packs/day for 40 years    Types: Cigarettes    Quit date: 03/04/2012  . Smokeless tobacco: Never Used  . Alcohol Use: No    History reviewed. No pertinent family history. Allergies  Allergen Reactions  . Other Swelling and Other (See Comments)    Patient took cardizem and metformin after last hospital stay that caused her to very bad swelling all over body.     OBJECTIVE: Blood pressure 149/99, pulse 85, temperature 97.9 F (36.6 C), temperature source Oral, resp. rate 17, height 5' 5.5" (1.664 m), weight 109.7 kg (241 lb 13.5 oz), SpO2 98.00%. General: She is sitting on the side of the bed. She is in no distress. She is slightly groggy with poor recall. Skin: No rash Oral: No oropharyngeal lesions Lungs: Few expiratory wheezes Cor: Distant but regular S1 and S2 no murmurs heard Abdomen: Obese, soft and nontender Joints and extremities: Normal  Microbiology: Recent Results (from the past 240 hour(s))  CLOSTRIDIUM DIFFICILE BY PCR     Status: Abnormal   Collection Time    09/09/12  4:15 PM      Result Value Range Status   C difficile by pcr POSITIVE (*) NEGATIVE Final   Comment: CRITICAL RESULT CALLED TO, READ BACK BY AND VERIFIED WITH:     L.YOUNG RN 0309 09/10/12 E.GADDY  CULTURE, BLOOD (ROUTINE X 2)     Status: None   Collection Time    09/09/12  6:49 PM      Result Value Range Status   Specimen Description BLOOD HAND LEFT   Final   Special Requests BOTTLES DRAWN AEROBIC ONLY 3CC   Final   Culture  Setup Time 09/10/2012 01:16   Final   Culture     Final   Value:        BLOOD CULTURE RECEIVED NO GROWTH TO DATE CULTURE WILL BE HELD FOR 5 DAYS BEFORE ISSUING A FINAL NEGATIVE REPORT   Report Status PENDING   Incomplete  CULTURE, BLOOD (ROUTINE X 2)      Status: None   Collection Time    09/09/12  7:10 PM      Result Value Range Status   Specimen Description BLOOD HAND RIGHT   Final   Special Requests BOTTLES DRAWN AEROBIC ONLY 3CC   Final   Culture  Setup Time 09/10/2012 01:16   Final   Culture     Final   Value:        BLOOD CULTURE RECEIVED NO GROWTH TO DATE CULTURE WILL BE HELD FOR 5 DAYS BEFORE ISSUING A FINAL NEGATIVE REPORT   Report Status PENDING   Incomplete  MRSA PCR SCREENING     Status: None   Collection Time  09/09/12 11:11 PM      Result Value Range Status   MRSA by PCR NEGATIVE  NEGATIVE Final   Comment:            The GeneXpert MRSA Assay (FDA     approved for NASAL specimens     only), is one component of a     comprehensive MRSA colonization     surveillance program. It is not     intended to diagnose MRSA     infection nor to guide or     monitor treatment for     MRSA infections.  URINE CULTURE     Status: None   Collection Time    09/10/12  9:19 AM      Result Value Range Status   Specimen Description URINE, CATHETERIZED   Final   Special Requests NONE   Final   Culture  Setup Time 09/10/2012 10:15   Final   Colony Count NO GROWTH   Final   Culture NO GROWTH   Final   Report Status 09/11/2012 FINAL   Final  URINE CULTURE     Status: None   Collection Time    09/10/12  4:27 PM      Result Value Range Status   Specimen Description URINE, CATHETERIZED   Final   Special Requests NONE   Final   Culture  Setup Time 09/10/2012 23:24   Final   Colony Count NO GROWTH   Final   Culture NO GROWTH   Final   Report Status 09/11/2012 FINAL   Final    Cliffton Asters, MD [facility] [phone] pager   [phone] cell 09/12/2012, 11:19 AM

## 2012-09-12 NOTE — Progress Notes (Signed)
Placed pt on PSV 5, 5 peep for SBT.  Pt appears to tol well so far, no distress noted.

## 2012-09-12 NOTE — Progress Notes (Addendum)
NUTRITION FOLLOW UP  Intervention:   1.  Modify diet; diet advancement per MD discretion as medical status improves.  Recommend CHO Mod Medium.  Nutrition Dx:   Unintended wt loss, ongoing New dx:  Inadequate oral intake r/t inability to eat AEB mechanical ventilation  Monitor:   1. Food/Beverage; pt meeting >/=90% estimated needs with tolerance.  Not met, pt with recent extubation 2. Wt/wt change; monitor trends.  Ongoing. 3.  Enteral nutrition; Enteral nutrition to provide 60-70% of estimated calorie needs (22-25 kcals/kg ideal body weight) and 100% of estimated protein needs, based on ASPEN guidelines for permissive underfeeding in critically ill obese individuals.  No longer appropriate, TFs not warranted  Assessment:    RD consulted for initiation of TFs.  Discussed with RN who extubated pt this morning.   Pt currently without access for TFs and is doing well post-extubation.   RD will not order TFs at this time.  Please initiate Adult Tube Feeding Protocol if TFs warranted which includes consult to RD for management.   RD to follow for ongoing nutrition-related interventions and diet advancement. Recommend CHO Mod Medium goal.    Height: Ht Readings from Last 1 Encounters:  09/10/12 5' 5.5" (1.664 m)    Weight Status:   Wt Readings from Last 1 Encounters:  09/11/12 241 lb 10 oz (109.6 kg)    Re-estimated needs:  Kcal: 1960-2180 Protein: 90-105g Fluid: >1.8 L/day  Skin: intact  Diet Order: NPO   Intake/Output Summary (Last 24 hours) at 09/12/12 1103 Last data filed at 09/12/12 1100  Gross per 24 hour  Intake   3086 ml  Output   2470 ml  Net    616 ml     Last BM: 7/11   Labs:   CMP     Component Value Date/Time   NA 143 09/12/2012 0508   K 3.8 09/12/2012 0508   CL 110 09/12/2012 0508   CO2 24 09/12/2012 0508   GLUCOSE 187* 09/12/2012 0508   BUN 26* 09/12/2012 0508   CREATININE 0.86 09/12/2012 0508   CALCIUM 8.4 09/12/2012 0508   PROT 5.0* 09/10/2012 1614    ALBUMIN 1.9* 09/10/2012 1614   AST 13 09/10/2012 1614   ALT 9 09/10/2012 1614   ALKPHOS 49 09/10/2012 1614   BILITOT 0.2* 09/10/2012 1614   GFRNONAA 73* 09/12/2012 0508   GFRAA 85* 09/12/2012 0508      CBG (last 3)   Recent Labs  09/12/12 0026 09/12/12 0511 09/12/12 0827  GLUCAP 170* 161* 181*       Scheduled Meds: . ipratropium  0.5 mg Nebulization Q6H WA   And  . albuterol  2.5 mg Nebulization Q6H  . antiseptic oral rinse  15 mL Mouth Rinse QID  . chlorhexidine  15 mL Mouth Rinse BID  . famotidine  20 mg Oral QHS  . fidaxomicin  200 mg Oral BID  . heparin  5,000 Units Subcutaneous Q8H  . insulin aspart  0-20 Units Subcutaneous TID WC  . magnesium sulfate 1 - 4 g bolus IVPB  1 g Intravenous Once   Continuous Infusions: . sodium chloride Stopped (09/11/12 0000)   PRN Meds:.acetaminophen   Loyce Dys, MS RD LDN Clinical Inpatient Dietitian Pager: [phone] Weekend/After hours pager: [phone]

## 2012-09-13 DIAGNOSIS — N179 Acute kidney failure, unspecified: Secondary | ICD-10-CM

## 2012-09-13 DIAGNOSIS — J9600 Acute respiratory failure, unspecified whether with hypoxia or hypercapnia: Secondary | ICD-10-CM

## 2012-09-13 LAB — GLUCOSE, CAPILLARY
Glucose-Capillary: 139 mg/dL — ABNORMAL HIGH (ref 70–99)
Glucose-Capillary: 133 mg/dL — ABNORMAL HIGH (ref 70–99)

## 2012-09-13 MED ORDER — IPRATROPIUM BROMIDE 0.02 % IN SOLN
0.5000 mg | Freq: Four times a day (QID) | RESPIRATORY_TRACT | Status: DC
  Administered 2012-09-13 – 2012-09-16 (×12): 0.5 mg via RESPIRATORY_TRACT
  Filled 2012-09-13 (×11): qty 2.5

## 2012-09-13 MED ORDER — GABAPENTIN 100 MG PO CAPS
100.0000 mg | ORAL_CAPSULE | Freq: Three times a day (TID) | ORAL | Status: DC
  Administered 2012-09-13 – 2012-09-16 (×10): 100 mg via ORAL
  Filled 2012-09-13 (×13): qty 1

## 2012-09-13 MED ORDER — BUDESONIDE 0.25 MG/2ML IN SUSP
0.2500 mg | Freq: Two times a day (BID) | RESPIRATORY_TRACT | Status: DC
  Administered 2012-09-13 – 2012-09-16 (×6): 0.25 mg via RESPIRATORY_TRACT
  Filled 2012-09-13 (×9): qty 2

## 2012-09-13 MED ORDER — ALBUTEROL SULFATE (5 MG/ML) 0.5% IN NEBU
2.5000 mg | INHALATION_SOLUTION | Freq: Four times a day (QID) | RESPIRATORY_TRACT | Status: DC
  Administered 2012-09-13 – 2012-09-16 (×12): 2.5 mg via RESPIRATORY_TRACT
  Filled 2012-09-13 (×11): qty 0.5

## 2012-09-13 MED ORDER — ZOLPIDEM TARTRATE 5 MG PO TABS
5.0000 mg | ORAL_TABLET | Freq: Every evening | ORAL | Status: DC | PRN
  Administered 2012-09-14: 5 mg via ORAL
  Filled 2012-09-13: qty 1

## 2012-09-13 MED ORDER — SODIUM CHLORIDE 0.9 % IV SOLN
INTRAVENOUS | Status: DC
  Administered 2012-09-13: 17:00:00 via INTRAVENOUS
  Administered 2012-09-14: 20 mL/h via INTRAVENOUS
  Administered 2012-09-15: 50 mL/h via INTRAVENOUS

## 2012-09-13 MED ORDER — SERTRALINE HCL 50 MG PO TABS
50.0000 mg | ORAL_TABLET | Freq: Every day | ORAL | Status: DC
  Administered 2012-09-13 – 2012-09-16 (×4): 50 mg via ORAL
  Filled 2012-09-13 (×4): qty 1

## 2012-09-13 MED ORDER — ASPIRIN 81 MG PO CHEW
81.0000 mg | CHEWABLE_TABLET | Freq: Every day | ORAL | Status: DC
  Administered 2012-09-13 – 2012-09-16 (×4): 81 mg via ORAL
  Filled 2012-09-13 (×3): qty 1

## 2012-09-13 NOTE — Progress Notes (Signed)
PULMONARY  / CRITICAL CARE MEDICINE  Name: [name] MRN: [id number] DOB: 1955/04/19    ADMISSION DATE:  09/09/2012 CONSULTATION DATE:  09/10/12  REFERRING MD :  [facility] PRIMARY SERVICE: PCCM  CHIEF COMPLAINT:  Septic shock secondary to C. Difficile    BRIEF: 57 y.o. Obese white female with PMH of COPD(baseline 3L [place]) diabetes, obesity and CAD was admitted by [facility] for 2 months of diarrhea.  Patient had a recent admission to [facility] for ventilator weaning and rehab.  Patient was diagnosed with C. Diff while in the hospital and received a full course of treatment but states that she has continued to have diarrhea since she was discharged home (09/13/12 addendum:not sure if this is correct info for May 2014, epic documents c diff only inJan 2014)  On 09/09/2012:   Patient presented to ED at urging of PCP and was found to be severely hypotensive with elevated SCr.  TRH treated with IV hydration for hypotension and AKI.  Despite IV hydration patient remained severely hypotensive, and subsequently became dyspneic and was put on bipap therapy.  PCCM initiated sepsis protocol, intubated the patient, and placed a central venous line on 7/914    LINES / TUBES: 7/09 R IJ >>> 7/09 ETT >>>09/12/12  CULTURES: 09/09/2012 Clostridium difficile PCR is positive [also positive 03/20/2012 and 04/04/2012] 7/09 Blood >> negative 7/09 Urine >> negative    ANTIBIOTICS: 7/09 Vanc (enteral) >>> 09/12/2012 7/09 Vanc enema >>> 09/12/2012 7/09 metronidazole >>> 09/12/2012 ...............Marland Kitchen DIFICID 09/12/12 (not given due to $ and approval) Vancomycin PO pulse by ID 09/12/12 >>( taper will be done by ID in their office)  SIGNIFICANT EVENTS / STUDIES:  07/2012 d/c from [facility] ----------------------------------------------- 7/08 Admitted to [facility] 7/09 Failed Bipap therapy, intubated 7/09 Hypotensive episode requiring pressor  09/11/12:  On levophed , On prn sedation. Meet spontaneous breathing trial  criteria although the fact she is on pressors. Still with diarrhea but mostly when she gets enema   09/12/2012: Diarrhea only with enema. Nontoxic. Off pressors. Meets extubation criteria.   SUBJECTIVE/OVERNIGHT/INTERVAL HX 09/13/2012: Doing well post extubation. Had some wheeze possibly upper airway in the morning but resolved with nebulizer. She feels well except for significnat diarrhea. Asking when she can be discharged home  VITAL SIGNS: Temp:  [97.9 F (36.6 C)-98.9 F (37.2 C)] 98.9 F (37.2 C) (07/12 0800) Pulse Rate:  [31-126] 76 (07/12 0700) Resp:  [11-30] 16 (07/12 0800) BP: (105-154)/(53-99) 134/61 mmHg (07/12 0800) SpO2:  [87 %-100 %] 99 % (07/12 0930) Arterial Line BP: (143)/(65) 143/65 mmHg (07/11 1100) FiO2 (%):  [32 %] 32 % (07/12 0930) Weight:  [110.2 kg (242 lb 15.2 oz)] 110.2 kg (242 lb 15.2 oz) (07/12 0444) HEMODYNAMICS:   VENTILATOR SETTINGS: Vent Mode:  [-]  FiO2 (%):  [32 %] 32 % INTAKE / OUTPUT: Intake/Output     07/11 0701 - 07/12 0700 07/12 0701 - 07/13 0700   P.O. 1560 240   I.V. (mL/kg) 100 (0.9)    NG/GT 90    IV Piggyback 100    Total Intake(mL/kg) 1850 (16.8) 240 (2.2)   Urine (mL/kg/hr) 350 (0.1) 150 (0.4)   Total Output 350 150   Net +1500 +90        Urine Occurrence 8 x 2 x   Stool Occurrence 14 x 2 x     PHYSICAL EXAMINATION: General:  Obese, female sitting on a chair and watching television and looking comfortable  Neuro: n: Alert and oriented x3. CAM-ICU negative  for delirium. RASS sedation score 0. Speech normal and sitting HEENT:  MM dry and pink, no noted JVD, no carotid bruits. 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 Cuff leak test positive Cardiovascular:  RRR, no M/R/G Lungs:  Diminished breath sounds at bases, no wheezes Abdomen:  Soft, nontender with normal bowel sounds  Musculoskeletal:  No gross deformities, moves extremities x4 Skin:  No obvious breakdowns noted  LABS: - see indivdiaula A and P   IMAGING x 48h No  results found.  ASSESSMENT / PLAN:  PULMONARY  Recent Labs Lab 09/10/12 1006 09/10/12 1215 09/10/12 1615 09/10/12 1616 09/10/12 1735 09/10/12 1918  PHART 7.208* 7.228*  --  7.181* 7.204* 7.200*  PCO2ART 65.2* 58.4*  --  61.2* 49.5* 46.8*  PO2ART 79.0* 104.0*  --  86.0 79.0* 100.0  HCO3 26.0* 23.5  --  23.0 19.7* 18.3*  TCO2 28 25.3  --  25 21 20   O2SAT 92.0 96.4 81.2 93.0 93.0 96.0    A: Acute on Chronic Hypercarbic Respiratory failure. Status post chronic critical illness in May 2014 with prior tracheostomy COPD on home oxygen 3L  H/O ARDS/H1N1  09/13/2012: Status post extubation 24 hours ago and doing well. Currently no wheeze P:   - Aggressive pulmonary toileting - 3L  Home oxygen - BD duoneb + pulmicort bid neb - Aim to transition to home mdi/neb prior to dc - FU Dr Vassie Loll pulmonary clinic or his nurse Tammy PArrett to be arrranged < 1 week of discharge   CARDIOVASCULAR  Recent Labs Lab 09/12/12 0508  PROBNP 25.8      Recent Labs Lab 09/10/12 1614 09/11/12 1005 09/11/12 1642 09/12/12 0044  TROPONINI <0.30 <0.30 <0.30 <0.30    A:  Septic Shock due to C. difficile H/o hyperlipidemia H/o hypertension  09/11/2012: Status post septic shock protocol ending today. Currently on levophed at 09/13/2012: Off pressors x 48h  P:  -maintance fluids - restart home aspirin - once diarrhea settles, start home imdur and lasix; ? Needs this at al   RENAL  Recent Labs Lab 09/09/12 1610 09/09/12 2315 09/10/12 0340 09/10/12 1614 09/12/12 0508  NA 128*  --  132* 136 143  K 3.6  --  3.9 4.2 3.8  CL 88*  --  96 103 110  CO2 26  --  24 22 24   GLUCOSE 144*  --  112* 117* 187*  BUN 63*  --  61* 56* 26*  CREATININE 2.56* 2.31* 2.47* 2.05* 0.86  CALCIUM 8.2*  --  7.6* 7.6* 8.4  MG  --   --   --   --  1.7  PHOS  --   --   --   --  2.6   Intake/Output     07/11 0701 - 07/12 0700 07/12 0701 - 07/13 0700   P.O. 1560 240   I.V. (mL/kg) 100 (0.9)     NG/GT 90    IV Piggyback 100    Total Intake(mL/kg) 1850 (16.8) 240 (2.2)   Urine (mL/kg/hr) 350 (0.1) 150 (0.4)   Total Output 350 150   Net +1500 +90        Urine Occurrence 8 x 2 x   Stool Occurrence 14 x 2 x     A:   AKI- baseline SCr 0.9 Hyponatremia  09/12/2012: Acute renal failure has resolved. She is mild hypomagnesemia 09/13/12: At risk for dehydration due to diarrhea  P:   -NS @75  mL/hr -=-Bmet in am  GASTROINTESTINAL  Recent Labs Lab  09/09/12 1610 09/10/12 0340 09/10/12 1614  AST 15 14 13   ALT 7 8 9   ALKPHOS 86 55 49  BILITOT 0.1* 0.1* 0.2*  PROT 5.2* 5.1* 5.0*  ALBUMIN 1.9* 1.9* 1.9*  INR  --   --  1.06    Recent Labs Lab 09/09/12 1610 09/09/12 2315 09/10/12 0340 09/10/12 1614 09/12/12 0043  WBC 16.0* 14.6* 13.2* 14.1* 12.2*     A:   C. Difficile Enterocolitis GERD  09/13/2012:  Clinically she is improving but says she had diarrhea aleast 10 times overnight P:   -oral diet -Famotidine for SUP -See ID for abx - NO PPI (she is on home PPI and this should be discontinued)  HEMATOLOGIC  Recent Labs Lab 09/10/12 0340 09/10/12 1614 09/12/12 0043  HGB 10.9* 10.9* 10.3*  HCT 34.6* 35.0* 32.5*  WBC 13.2* 14.1* 12.2*  PLT 270 298 300    Recent Labs Lab 09/10/12 1614  INR 1.06    A:  Anemia of chronic disease and critical illness P:  --Subq heparin for DVT prophylaxis - Pack red blood cells only for hemoglobin less than 7 g percent unless actively bleeding   INFECTIOUS  Recent Labs Lab 09/09/12 1756 09/10/12 1619 09/12/12 0509  LATICACIDVEN 0.54 0.3* 0.8    Recent Labs Lab 09/09/12 1610 09/09/12 2315 09/10/12 0340 09/10/12 1614 09/12/12 0043  WBC 16.0* 14.6* 13.2* 14.1* 12.2*    A:   Septic shock secondary to recurrence  C. Diff enterocolitis  09/13/2012: OFf pressor x 48h. Afebrile. Improving wbc but diarrhea 14 times   P:   Po vanc per ID FU ID clinic needed  ENDOCRINE CBG (last 3)   Recent Labs   09/12/12 1651 09/12/12 2116 09/13/12 0821  GLUCAP 109* 137* 139*     A:   DM II R/o Adrenal Insufficiency  P: -SSI - continue to hold oral agents for DM  NEUROLOGIC A:   Acute encephalopathy Chronic pain Chronic CNS meds: On home gabapentin, hydrocodone/acetaminophen, ropinirole, Zoloft and Ambien  - 09/22/2012: Normal mental status and not in pain but wants her home pain meds restart  P:   - Restartrf   Requip, zoloft and ambien and gabapentin Depending on course restart norco  GLOBAL:   09/11/12 and 09/12/12: no family at bedside  09/13/12: Family not at bedside but she says husband visited this AM. No car fro family to vsiit.  due to grandson wrecking car. Move to floor unde triad 09/13/12. [facility] will pick up 09/14/12 and pccm off. D/w Dr Dolphus Jenny      Dr. Kalman Shan, M.D., [facility].C.P Pulmonary and Critical Care Medicine Staff Physician [facility] Pager: [phone], If no answer or between  15:00h - 7:00h: call [phone]  09/13/2012 10:20 AM

## 2012-09-14 ENCOUNTER — Inpatient Hospital Stay (HOSPITAL_COMMUNITY): Payer: Medicare Other

## 2012-09-14 LAB — GLUCOSE, CAPILLARY: Glucose-Capillary: 204 mg/dL — ABNORMAL HIGH (ref 70–99)

## 2012-09-14 LAB — BASIC METABOLIC PANEL
CO2: 27 mEq/L (ref 19–32)
GFR calc non Af Amer: >90 mL/min (ref >90)
Glucose, Bld: 161 mg/dL — ABNORMAL HIGH (ref 70–99)
Potassium: 3.6 mEq/L (ref 3.5–5.1)
Sodium: 141 mEq/L (ref 135–145)

## 2012-09-14 LAB — PHOSPHORUS: Phosphorus: 3.8 mg/dL (ref 2.3–4.6)

## 2012-09-14 LAB — MAGNESIUM: Magnesium: 1.6 mg/dL (ref 1.5–2.5)

## 2012-09-14 LAB — CBC
HCT: 34.6 % — ABNORMAL LOW (ref 36.0–46.0)
Hemoglobin: 11.1 g/dL — ABNORMAL LOW (ref 12.0–15.0)
WBC: 8.6 10*3/uL (ref 4.0–10.5)

## 2012-09-14 MED ORDER — VANCOMYCIN HCL 10 G IV SOLR
2000.0000 mg | Freq: Once | INTRAVENOUS | Status: AC
  Administered 2012-09-14: 2000 mg via INTRAVENOUS
  Filled 2012-09-14: qty 2000

## 2012-09-14 MED ORDER — FUROSEMIDE 20 MG PO TABS
20.0000 mg | ORAL_TABLET | Freq: Every day | ORAL | Status: DC
  Administered 2012-09-14: 20 mg via ORAL
  Filled 2012-09-14 (×3): qty 1

## 2012-09-14 MED ORDER — FUROSEMIDE 10 MG/ML IJ SOLN
40.0000 mg | Freq: Once | INTRAMUSCULAR | Status: AC
  Administered 2012-09-14: 40 mg via INTRAVENOUS
  Filled 2012-09-14: qty 4

## 2012-09-14 MED ORDER — VANCOMYCIN HCL IN DEXTROSE 1-5 GM/200ML-% IV SOLN
1000.0000 mg | Freq: Three times a day (TID) | INTRAVENOUS | Status: DC
  Administered 2012-09-14 – 2012-09-15 (×3): 1000 mg via INTRAVENOUS
  Filled 2012-09-14 (×4): qty 200

## 2012-09-14 MED ORDER — PIPERACILLIN-TAZOBACTAM 3.375 G IVPB
3.3750 g | Freq: Three times a day (TID) | INTRAVENOUS | Status: DC
  Administered 2012-09-14 – 2012-09-15 (×4): 3.375 g via INTRAVENOUS
  Filled 2012-09-14 (×5): qty 50

## 2012-09-14 NOTE — Progress Notes (Signed)
Late Entry -  During the night, the patient became increasingly SOB.  O2 sats remained greater than 95% on 3-4 L [place].  Respiratory called to assess.   Decreased breath sounds but moving air.  Also, patient noted increasing BLE.  She stated that it had increased during the day.  Patient was receiving NS at 75 cc/hr.  Her weight at 0444 on 7/12 was 110.2 and at 2111, it was 111.267.  An increase of 1.067 kg.  Called MD, received order for 40 mg IV Lasix.  By 0700 this morning, she had UOP of 2350 cc.  IVF decreased to [facility].  Also, chest xray showed infiltrates.  BC x 2 ordered.  IV Vanc and Zosyn ordered.  Will continue to monitor patient.  Carley Strickling RN, Justine Null.

## 2012-09-14 NOTE — Progress Notes (Signed)
ANTIBIOTIC CONSULT NOTE - INITIAL  Pharmacy Consult for Vancomycin and Zosyn  Indication: rule out pneumonia  Allergies  Allergen Reactions  . Other Swelling and Other (See Comments)    Patient took cardizem and metformin after last hospital stay that caused her to very bad swelling all over body.     Patient Measurements: Height: 5' 5.5" (166.4 cm) Weight: 245 lb 4.8 oz (111.267 kg) IBW/kg (Calculated) : 58.15 Adjusted Body Weight: 80 kg  Vital Signs: Temp: 98.4 F (36.9 C) (07/12 2111) Temp src: Oral (07/12 2111) BP: 120/77 mmHg (07/12 2111) Pulse Rate: 75 (07/12 2111) Intake/Output from previous day: 07/12 0701 - 07/13 0700 In: 855 [P.O.:480; I.V.:375] Out: 375 [Urine:375] Intake/Output from this shift:    Labs:  Recent Labs  09/12/12 0043 09/12/12 0508  WBC 12.2*  --   HGB 10.3*  --   PLT 300  --   CREATININE  --  0.86   Estimated Creatinine Clearance: 89.4 ml/min (by C-G formula based on Cr of 0.86). No results found for this basename: VANCOTROUGH, Leodis Binet, VANCORANDOM, GENTTROUGH, GENTPEAK, GENTRANDOM, TOBRATROUGH, TOBRAPEAK, TOBRARND, AMIKACINPEAK, AMIKACINTROU, AMIKACIN,  in the last 72 hours   Microbiology: Recent Results (from the past 720 hour(s))  CLOSTRIDIUM DIFFICILE BY PCR     Status: Abnormal   Collection Time    09/09/12  4:15 PM      Result Value Range Status   C difficile by pcr POSITIVE (*) NEGATIVE Final   Comment: CRITICAL RESULT CALLED TO, READ BACK BY AND VERIFIED WITH:     L.YOUNG RN [id number] 09/10/12 E.GADDY  CULTURE, BLOOD (ROUTINE X 2)     Status: None   Collection Time    09/09/12  6:49 PM      Result Value Range Status   Specimen Description BLOOD HAND LEFT   Final   Special Requests BOTTLES DRAWN AEROBIC ONLY 3CC   Final   Culture  Setup Time 09/10/2012 01:16   Final   Culture     Final   Value:        BLOOD CULTURE RECEIVED NO GROWTH TO DATE CULTURE WILL BE HELD FOR 5 DAYS BEFORE ISSUING A FINAL NEGATIVE REPORT   Report Status  PENDING   Incomplete  CULTURE, BLOOD (ROUTINE X 2)     Status: None   Collection Time    09/09/12  7:10 PM      Result Value Range Status   Specimen Description BLOOD HAND RIGHT   Final   Special Requests BOTTLES DRAWN AEROBIC ONLY 3CC   Final   Culture  Setup Time 09/10/2012 01:16   Final   Culture     Final   Value:        BLOOD CULTURE RECEIVED NO GROWTH TO DATE CULTURE WILL BE HELD FOR 5 DAYS BEFORE ISSUING A FINAL NEGATIVE REPORT   Report Status PENDING   Incomplete  MRSA PCR SCREENING     Status: None   Collection Time    09/09/12 11:11 PM      Result Value Range Status   MRSA by PCR NEGATIVE  NEGATIVE Final   Comment:            The GeneXpert MRSA Assay (FDA     approved for NASAL specimens     only), is one component of a     comprehensive MRSA colonization     surveillance program. It is not     intended to diagnose MRSA     infection nor  to guide or     monitor treatment for     MRSA infections.  URINE CULTURE     Status: None   Collection Time    09/10/12  9:19 AM      Result Value Range Status   Specimen Description URINE, CATHETERIZED   Final   Special Requests NONE   Final   Culture  Setup Time 09/10/2012 10:15   Final   Colony Count NO GROWTH   Final   Culture NO GROWTH   Final   Report Status 09/11/2012 FINAL   Final  URINE CULTURE     Status: None   Collection Time    09/10/12  4:27 PM      Result Value Range Status   Specimen Description URINE, CATHETERIZED   Final   Special Requests NONE   Final   Culture  Setup Time 09/10/2012 23:24   Final   Colony Count NO GROWTH   Final   Culture NO GROWTH   Final   Report Status 09/11/2012 FINAL   Final    Medical History: Past Medical History  Diagnosis Date  . COPD (chronic obstructive pulmonary disease)   . Respiratory failure   . Morbid obesity   . Tobacco abuse   . GERD (gastroesophageal reflux disease)   . Hyperlipidemia   . Chronic pain   . Hypertension   . Heart attack   . Arthritis   .  Allergic rhinitis   . Diabetes mellitus, type 2   . Diabetes mellitus type 2 in obese 02/29/2012  . HTN (hypertension) 02/29/2012    Medications:  Prescriptions prior to admission  Medication Sig Dispense Refill  . acetaminophen (TYLENOL) 325 MG tablet Take 650 mg by mouth every 6 (six) hours as needed for pain.      Marland Kitchen antiseptic oral rinse (BIOTENE) LIQD 15 mLs by Mouth Rinse route QID.      Marland Kitchen aspirin 81 MG chewable tablet Chew 1 tablet (81 mg total) by mouth daily.      . diclofenac sodium (VOLTAREN) 1 % GEL Apply 4 g topically 2 (two) times daily as needed (for foot pain).       . furosemide (LASIX) 20 MG tablet Take 20 mg by mouth 2 (two) times daily.       Marland Kitchen gabapentin (NEURONTIN) 100 MG capsule Take 1 capsule (100 mg total) by mouth 3 (three) times daily.  90 capsule  0  . glimepiride (AMARYL) 2 MG tablet Take 2 mg by mouth 2 (two) times daily with a meal.      . HYDROcodone-acetaminophen (NORCO) 7.5-325 MG per tablet Take 1 tablet by mouth every 4 (four) hours as needed for pain.      Marland Kitchen insulin aspart (NOVOLOG) 100 UNIT/ML injection Inject 0-8 Units into the skin 3 (three) times daily with meals. On sliding scale:  0-100 0 units 101-150 take 3 units 151-200 take 5 units 201>300 take 8 units 400 or higher call MD      . insulin glargine (LANTUS) 100 UNIT/ML injection Inject 32 Units into the skin at bedtime.      Marland Kitchen ipratropium-albuterol (DUONEB) 0.5-2.5 (3) MG/3ML SOLN Take 3 mLs by nebulization 3 (three) times daily.      . isosorbide mononitrate (IMDUR) 30 MG 24 hr tablet Take 1 tablet (30 mg total) by mouth daily.      . pantoprazole (PROTONIX) 40 MG tablet Take 40 mg by mouth daily.      Marland Kitchen rOPINIRole (REQUIP)  1 MG tablet Take 1 mg by mouth at bedtime.      . sertraline (ZOLOFT) 50 MG tablet Take 50 mg by mouth daily.      Marland Kitchen zolpidem (AMBIEN) 5 MG tablet Take 5 mg by mouth at bedtime as needed for sleep.       Assessment: 57 yo female with SOB, possible PNA for empiric  antibiotics  Goal of Therapy:  Vancomycin trough level 15-20 mcg/ml  Plan:  Vancomycin 2 g IV now, then 1 g IV q8h .Zosyn 3.375 g IV q8h   Eddie Candle 09/14/2012,2:05 AM

## 2012-09-14 NOTE — Progress Notes (Signed)
[facility] PROGRESS NOTE  [name] WUJ:[id number] DOB: 1955/08/25 DOA: 09/09/2012 PCP: [name], NP  Assessment/Plan: Principal Problem:   Recurrent Clostridium difficile diarrhea Active Problems:   COPD (chronic obstructive pulmonary disease)   Obesity   Hyperlipidemia   Diabetes mellitus type 2 in obese   Chronic pain   Hypertension   Type 2 diabetes mellitus with hyperglycemia   Acute renal failure   Severe sepsis with septic shock   Acute respiratory failure with hypoxia   Hypercapnia   Anemia   Acute on Chronic Hypercarbic Respiratory failure. Status post chronic critical illness in May 2014 with prior tracheostomy  COPD on home oxygen 3L  H/O ARDS/H1N1  09/13/2012: Status post extubation 24 hours ago and doing well. Currently no wheeze Continue DuoNeb Pulmicort FU Dr Vassie Loll pulmonary clinic  to be arrranged < 1 week of discharge   Septic Shock due to C. difficile  H/o hyperlipidemia  H/o hypertension  Off levophed  Still having diarrhea  Acute kidney injury   C. difficile enterocolitis Infectious disease following Change fidaxomicin to oral vancomycin avoidance of antimotility agents in the near future and careful use of other systemic antibiotics Follow up with infectious disease Dr. Orvan Falconer   Anemia of chronic disease  Diabetes mellitus Holding oral agents  Acute encephalopathy/chronic pain She is on Requip, zoloft and ambien and gabapentin    Code Status: full Family Communication: family updated about patient's clinical progress Disposition Plan:  As above    Brief narrative: 57 y.o. Obese white female with PMH of COPD(baseline 3L [place]) diabetes, obesity and CAD was admitted by [facility] for 2 months of diarrhea. Patient had a recent admission to [facility] for ventilator weaning and rehab. Patient was diagnosed with C. Diff while in the hospital and received a full course of treatment but states that she has continued to have diarrhea  since she was discharged home (09/13/12 addendum:not sure if this is correct info for May 2014, epic documents c diff only inJan 2014)  On 09/09/2012: Patient presented to ED at urging of PCP and was found to be severely hypotensive with elevated SCr. TRH treated with IV hydration for hypotension and AKI. Despite IV hydration patient remained severely hypotensive, and subsequently became dyspneic and was put on bipap therapy. PCCM initiated sepsis protocol, intubated the patient, and placed a central venous line on 7/914  LINES / TUBES:  7/09 R IJ >>>  7/09 ETT >>>09/12/12  CULTURES:  09/09/2012 Clostridium difficile PCR is positive [also positive 03/20/2012 and 04/04/2012]  7/09 Blood >> negative  7/09 Urine >> negative  ANTIBIOTICS:  7/09 Vanc (enteral) >>> 09/12/2012  7/09 Vanc enema >>> 09/12/2012  7/09 metronidazole >>> 09/12/2012  ...............Marland Kitchen  DIFICID 09/12/12 (not given due to $ and approval)  Vancomycin PO pulse by ID 09/12/12 >>( taper will be done by ID in their office)  SIGNIFICANT EVENTS / STUDIES:  07/2012 d/c from [facility]  -----------------------------------------------  7/08 Admitted to [facility]  7/09 Failed Bipap therapy, intubated  7/09 Hypotensive episode requiring pressor  09/11/12: On levophed , On prn sedation. Meet spontaneous breathing trial criteria although the fact she is on pressors. Still with diarrhea but mostly when she gets enema  09/12/2012: Diarrhea only with enema. Nontoxic. Off pressors. Meets extubation criteria.  SUBJECTIVE/OVERNIGHT/INTERVAL HX  09/13/2012: Doing well post extubation. Had some wheeze possibly upper airway in the morning but resolved with nebulizer. She feels well except for significnat diarrhea   Objective: Filed Vitals:   09/13/12 2107 09/13/12 2111  09/13/12 2303 09/14/12 0502  BP:  120/77  136/81  Pulse:  75  78  Temp:  98.4 F (36.9 C)  98 F (36.7 C)  TempSrc:  Oral  Oral  Resp:  16  16  Height:      Weight:  111.267  kg (245 lb 4.8 oz)    SpO2: 95% 96% 96% 96%    Intake/Output Summary (Last 24 hours) at 09/14/12 0815 Last data filed at 09/14/12 0524  Gross per 24 hour  Intake    615 ml  Output   2576 ml  Net  -1961 ml    Exam:  General: Obese, female sitting on a chair and watching television and looking comfortable  Neuro: n: Alert and oriented x3. CAM-ICU negative for delirium. RASS sedation score 0. Speech normal and sitting  HEENT: MM dry and pink, no noted JVD, no carotid bruits. 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 Cuff leak test positive  Cardiovascular: RRR, no M/R/G  Lungs: Diminished breath sounds at bases, no wheezes  Abdomen: Soft, nontender with normal bowel sounds  Musculoskeletal: No gross deformities, moves extremities x4  Skin: No obvious breakdowns noted    Data Reviewed: Basic Metabolic Panel:  Recent Labs Lab 09/09/12 1610 09/09/12 2315 09/10/12 0340 09/10/12 1614 09/12/12 0508  NA 128*  --  132* 136 143  K 3.6  --  3.9 4.2 3.8  CL 88*  --  96 103 110  CO2 26  --  24 22 24   GLUCOSE 144*  --  112* 117* 187*  BUN 63*  --  61* 56* 26*  CREATININE 2.56* 2.31* 2.47* 2.05* 0.86  CALCIUM 8.2*  --  7.6* 7.6* 8.4  MG  --   --   --   --  1.7  PHOS  --   --   --   --  2.6    Liver Function Tests:  Recent Labs Lab 09/09/12 1610 09/10/12 0340 09/10/12 1614  AST 15 14 13   ALT 7 8 9   ALKPHOS 86 55 49  BILITOT 0.1* 0.1* 0.2*  PROT 5.2* 5.1* 5.0*  ALBUMIN 1.9* 1.9* 1.9*   No results found for this basename: LIPASE, AMYLASE,  in the last 168 hours No results found for this basename: AMMONIA,  in the last 168 hours  CBC:  Recent Labs Lab 09/09/12 1610 09/09/12 2315 09/10/12 0340 09/10/12 1614 09/12/12 0043 09/14/12 0724  WBC 16.0* 14.6* 13.2* 14.1* 12.2* 8.6  NEUTROABS 14.4*  --   --   --   --   --   HGB 11.4* 11.2* 10.9* 10.9* 10.3* 11.1*  HCT 35.6* 34.4* 34.6* 35.0* 32.5* 34.6*  MCV 84.0 84.3 86.3 87.1 85.8 85.0  PLT 264 273 270 298 300 419*     Cardiac Enzymes:  Recent Labs Lab 09/10/12 1614 09/11/12 1005 09/11/12 1642 09/12/12 0044  TROPONINI <0.30 <0.30 <0.30 <0.30   BNP (last 3 results)  Recent Labs  03/23/12 0612 03/27/12 0627 09/12/12 0508  PROBNP 184.7* 100.1 25.8     CBG:  Recent Labs Lab 09/13/12 0821 09/13/12 1238 09/13/12 1639 09/13/12 2110 09/14/12 0707  GLUCAP 139* 159* 133* 156* 138*    Recent Results (from the past 240 hour(s))  CLOSTRIDIUM DIFFICILE BY PCR     Status: Abnormal   Collection Time    09/09/12  4:15 PM      Result Value Range Status   C difficile by pcr POSITIVE (*) NEGATIVE Final   Comment: CRITICAL RESULT CALLED TO,  READ BACK BY AND VERIFIED WITH:     L.YOUNG RN 0309 09/10/12 E.GADDY  CULTURE, BLOOD (ROUTINE X 2)     Status: None   Collection Time    09/09/12  6:49 PM      Result Value Range Status   Specimen Description BLOOD HAND LEFT   Final   Special Requests BOTTLES DRAWN AEROBIC ONLY 3CC   Final   Culture  Setup Time 09/10/2012 01:16   Final   Culture     Final   Value:        BLOOD CULTURE RECEIVED NO GROWTH TO DATE CULTURE WILL BE HELD FOR 5 DAYS BEFORE ISSUING A FINAL NEGATIVE REPORT   Report Status PENDING   Incomplete  CULTURE, BLOOD (ROUTINE X 2)     Status: None   Collection Time    09/09/12  7:10 PM      Result Value Range Status   Specimen Description BLOOD HAND RIGHT   Final   Special Requests BOTTLES DRAWN AEROBIC ONLY 3CC   Final   Culture  Setup Time 09/10/2012 01:16   Final   Culture     Final   Value:        BLOOD CULTURE RECEIVED NO GROWTH TO DATE CULTURE WILL BE HELD FOR 5 DAYS BEFORE ISSUING A FINAL NEGATIVE REPORT   Report Status PENDING   Incomplete  MRSA PCR SCREENING     Status: None   Collection Time    09/09/12 11:11 PM      Result Value Range Status   MRSA by PCR NEGATIVE  NEGATIVE Final   Comment:            The GeneXpert MRSA Assay (FDA     approved for NASAL specimens     only), is one component of a     comprehensive  MRSA colonization     surveillance program. It is not     intended to diagnose MRSA     infection nor to guide or     monitor treatment for     MRSA infections.  URINE CULTURE     Status: None   Collection Time    09/10/12  9:19 AM      Result Value Range Status   Specimen Description URINE, CATHETERIZED   Final   Special Requests NONE   Final   Culture  Setup Time 09/10/2012 10:15   Final   Colony Count NO GROWTH   Final   Culture NO GROWTH   Final   Report Status 09/11/2012 FINAL   Final  URINE CULTURE     Status: None   Collection Time    09/10/12  4:27 PM      Result Value Range Status   Specimen Description URINE, CATHETERIZED   Final   Special Requests NONE   Final   Culture  Setup Time 09/10/2012 23:24   Final   Colony Count NO GROWTH   Final   Culture NO GROWTH   Final   Report Status 09/11/2012 FINAL   Final     Studies: Dg Chest Port 1 View  09/14/2012   *RADIOLOGY REPORT*  Clinical Data: Shortness of breath  PORTABLE CHEST - 1 VIEW  Comparison: 09/11/2012  Findings: Shallow inspiration.  Heart size and pulmonary vascularity are normal.  There is interval development of consolidation in the medial right lung base, likely in the right middle lung, suggesting pneumonia.  Mild atelectasis in the left lung base.  No pneumothorax.  No blunting of costophrenic angles.  IMPRESSION: Infiltration and volume loss in the right lung base medially suggesting pneumonia.   Original Report Authenticated By: Burman Nieves, M.D.   Portable Chest Xray In Am  09/11/2012   *RADIOLOGY REPORT*  Clinical Data: Endotracheal tube placement, shortness of breath  PORTABLE CHEST - 1 VIEW  Comparison: Portable chest x-ray of 09/10/2012  Findings: The tip of the endotracheal tube is approximately 2.9 cm above the carina.  Aeration has improved slightly with mild basilar atelectasis remaining.  The right central venous line is unchanged in position and cardiomegaly is stable.  IMPRESSION: Endotracheal  tube tip 2.9 cm above carina.  Slightly improved aeration.   Original Report Authenticated By: Dwyane Dee, M.D.   Dg Chest Portable 1 View  09/10/2012   *RADIOLOGY REPORT*  Clinical Data: Sepsis, central line placement  PORTABLE CHEST - 1 VIEW  Comparison: Portable exam 1457 hours compared to 09/09/2012  Findings: Tip of endotracheal tube approximately 2.5 cm above carina. Nasogastric tube extends into stomach. Right jugular central venous catheter tip projects over SVC. Enlargement of cardiac silhouette with pulmonary vascular congestion. Mild diffuse bilateral interstitial infiltrates question pulmonary edema. Mild basilar hypoinflation. No gross pleural effusion or pneumothorax.  IMPRESSION: No pneumothorax following central line placement. Probable mild pulmonary edema and bibasilar atelectasis.   Original Report Authenticated By: Ulyses Southward, M.D.   Dg Chest Portable 1 View  09/09/2012   *RADIOLOGY REPORT*  Clinical Data: 57 year old female with respiratory failure. Diarrhea.  PORTABLE CHEST - 1 VIEW  Comparison: 04/08/2012 and earlier.  Findings: Semi upright AP portable view 1750 hours.  Stable lung volumes.  Stable cardiac size and mediastinal contours.  No pneumothorax or pulmonary edema.  No pleural effusion or confluent pulmonary opacity.  Stable mild contour deformity associated with a small fat containing right diaphragmatic hernia (see 03/24/2012 CTA).  IMPRESSION: No acute cardiopulmonary abnormality.   Original Report Authenticated By: Erskine Speed, M.D.   Dg Abd Portable 1v  09/10/2012   *RADIOLOGY REPORT*  Clinical Data: Diarrhea, evaluate for possible colonic distention  PORTABLE ABDOMEN - 1 VIEW  Comparison: Abdomen films of 05/03/2012  Findings: A supine film of the abdomen does show apparent edema within the mucosa of the right colon and transverse colon with some "thumbprinting".  However, no bowel distention is seen.  No opaque calculi are noted.  Surgical clips are present in the right  upper quadrant from prior cholecystectomy with clips in the left lower quadrant and mid pelvis as well probably due to prior bilateral tubal ligation.  IMPRESSION:  1.  No bowel distention. 2.  Edema of the mucosa of the colon consistent with colitis.   Original Report Authenticated By: Dwyane Dee, M.D.    Scheduled Meds: . ipratropium  0.5 mg Nebulization Q6H   And  . albuterol  2.5 mg Nebulization Q6H  . aspirin  81 mg Oral Daily  . budesonide  0.25 mg Nebulization BID  . famotidine  20 mg Oral QHS  . gabapentin  100 mg Oral TID  . heparin  5,000 Units Subcutaneous Q8H  . insulin aspart  0-20 Units Subcutaneous TID WC  . piperacillin-tazobactam (ZOSYN)  IV  3.375 g Intravenous Q8H  . rOPINIRole  1 mg Oral QHS  . sertraline  50 mg Oral Daily  . vancomycin  125 mg Oral Q6H  . vancomycin  1,000 mg Intravenous Q8H   Continuous Infusions: . sodium chloride 20 mL/hr (09/14/12 0405)    Principal Problem:  Recurrent Clostridium difficile diarrhea Active Problems:   COPD (chronic obstructive pulmonary disease)   Obesity   Hyperlipidemia   Diabetes mellitus type 2 in obese   Chronic pain   Hypertension   Type 2 diabetes mellitus with hyperglycemia   Acute renal failure   Severe sepsis with septic shock   Acute respiratory failure with hypoxia   Hypercapnia   Anemia    Time spent: 40 minutes   [facility]  [facility] Pager [phone]. If 8PM-8AM, please contact night-coverage at [email or web address], password [facility] 09/14/2012, 8:15 AM  LOS: 5 days

## 2012-09-14 NOTE — Progress Notes (Signed)
eLink Physician-Brief Progress Note Patient Name: [name] DOB: [birth year] MRN: [id number]  Date of Service  09/14/2012   HPI/Events of Note   Shortness of breath, per RN doing OK; swelling  eICU Interventions  kvo fluids Lasix x1 cxr      Rashaad Hallstrom 09/14/2012, 12:13 AM

## 2012-09-14 NOTE — Progress Notes (Signed)
eLink Physician-Brief Progress Note Patient Name: BRANDYCE DIMARIO DOB: 06/06/1955 MRN: [id number]  Date of Service  09/14/2012   HPI/Events of Note   Shortness of breath CXR shows focal RML/RLL infiltrate  eICU Interventions  Cover HCAP with vanc/zosyn Blood cultures Discuss with ID in AM   Intervention Category Intermediate Interventions: Infection - evaluation and management  MCQUAID, DOUGLAS 09/14/2012, 2:03 AM

## 2012-09-15 LAB — GLUCOSE, CAPILLARY
Glucose-Capillary: 141 mg/dL — ABNORMAL HIGH (ref 70–99)
Glucose-Capillary: 163 mg/dL — ABNORMAL HIGH (ref 70–99)

## 2012-09-15 MED ORDER — RIFAXIMIN 200 MG PO TABS
400.0000 mg | ORAL_TABLET | Freq: Three times a day (TID) | ORAL | Status: DC
  Filled 2012-09-15 (×3): qty 2

## 2012-09-15 MED ORDER — RIFAXIMIN 550 MG PO TABS: 550.0000 mg | ORAL_TABLET | Freq: Three times a day (TID) | ORAL | Status: DC

## 2012-09-15 MED ORDER — LEVOFLOXACIN 500 MG PO TABS
500.0000 mg | ORAL_TABLET | Freq: Every day | ORAL | Status: DC
  Administered 2012-09-15 – 2012-09-16 (×2): 500 mg via ORAL
  Filled 2012-09-15 (×2): qty 1

## 2012-09-15 MED ORDER — RIFAXIMIN 550 MG PO TABS: 550.0000 mg | ORAL_TABLET | Freq: Two times a day (BID) | ORAL | Status: DC

## 2012-09-15 MED ORDER — FUROSEMIDE 10 MG/ML IJ SOLN
40.0000 mg | Freq: Every day | INTRAMUSCULAR | Status: DC
  Administered 2012-09-15: 40 mg via INTRAVENOUS
  Filled 2012-09-15 (×2): qty 4

## 2012-09-15 MED ORDER — FUROSEMIDE 10 MG/ML IJ SOLN
40.0000 mg | Freq: Once | INTRAMUSCULAR | Status: AC
  Administered 2012-09-15: 40 mg via INTRAVENOUS
  Filled 2012-09-15: qty 4

## 2012-09-15 NOTE — Progress Notes (Signed)
Physical Therapy Treatment Patient Details Name: [name] MRN: [id number] DOB: 1955/03/20 Today's Date: 09/15/2012 Time: 4132-4401 PT Time Calculation (min): 15 min  PT Assessment / Plan / Recommendation  PT Comments   Pt doing very well with mobility.  Follow Up Recommendations  Home health PT;Supervision - Intermittent     Does the patient have the potential to tolerate intense rehabilitation     Barriers to Discharge        Equipment Recommendations  None recommended by PT    Recommendations for Other Services    Frequency Min 3X/week   Progress towards PT Goals Progress towards PT goals: Progressing toward goals  Plan Current plan remains appropriate    Precautions / Restrictions Precautions Precautions: None   Pertinent Vitals/Pain Pt on 3L 02.    Mobility  Transfers Sit to Stand: 6: Modified independent (Device/Increase time);With upper extremity assist;From bed Stand to Sit: 6: Modified independent (Device/Increase time);With upper extremity assist;To bed Ambulation/Gait Ambulation/Gait Assistance: 5: Supervision Ambulation Distance (Feet): 350 Feet Assistive device: Rolling walker Ambulation/Gait Assistance Details: 2 standing rest breaks. Gait Pattern: Step-through pattern;Decreased stride length    Exercises     PT Diagnosis:    PT Problem List:   PT Treatment Interventions:     PT Goals (current goals can now be found in the care plan section)    Visit Information  Last PT Received On: 09/15/12 Assistance Needed: +1 History of Present Illness: Patient is a 57 yo female admitted in December with influenza and pna.  At end of admission, with C-dfif.  Intubated and with trach.  To [facility] - recently discharged home.  Now admitted with persistent diarrhea and septic shock - intubated.  Patient extubated 09/12/12.    Subjective Data      Cognition  Cognition Arousal/Alertness: Awake/alert Behavior During Therapy: WFL for tasks  assessed/performed Overall Cognitive Status: Within Functional Limits for tasks assessed    Balance  Balance Balance Assessed: Yes Static Standing Balance Static Standing - Balance Support: No upper extremity supported;During functional activity Static Standing - Level of Assistance: 6: Modified independent (Device/Increase time)  End of Session PT - End of Session Equipment Utilized During Treatment: Oxygen Activity Tolerance: Patient tolerated treatment well Patient left: in bed;with call bell/phone within reach Nurse Communication: Mobility status   GP     [facility] 09/15/2012, 4:26 PM  [employer] PT [phone]

## 2012-09-15 NOTE — Progress Notes (Addendum)
[facility] PROGRESS NOTE  [name] ZOX:[id number] DOB: [birth year] DOA: 09/09/2012 PCP: [name], NP  Assessment/Plan: Principal Problem:   Recurrent Clostridium difficile diarrhea Active Problems:   COPD (chronic obstructive pulmonary disease)   Obesity   Hyperlipidemia   Diabetes mellitus type 2 in obese   Chronic pain   Hypertension   Type 2 diabetes mellitus with hyperglycemia   Acute renal failure   Severe sepsis with septic shock   Acute respiratory failure with hypoxia   Hypercapnia   Anemia    Acute on Chronic Hypercarbic Respiratory failure. Status post chronic critical illness in May 2014 with prior tracheostomy  COPD on home oxygen 3L  H/O ARDS/H1N1  09/13/2012: Status post extubation 24 hours ago and doing well. Currently no wheeze  Continue DuoNeb Pulmicort  FU Dr Vassie Loll pulmonary clinic to be arrranged < 1 week of discharge  Discontinue broad-spectrum antibiotics as the patient's diarrhea was not improving Switch to Levaquin for 3 more days and then discontinue   Septic Shock due to C. difficile  H/o hyperlipidemia  H/o hypertension  Off levophed  Still having diarrhea  she is at risk for significant dehydration,but has  anasarca ,therefore restart lasix today    Acute kidney injury -resolved     C. difficile enterocolitis  Infectious disease following  Changed from fidaxomicin to oral vancomycin  avoidance of antimotility agents in the near future and careful use of other systemic antibiotics  Follow up with infectious disease Dr. Orvan Falconer  According to Dr. Orvan Falconer indication for rifaximin  Anemia of chronic disease   Diabetes mellitus  Holding oral agents    Acute encephalopathy/chronic pain    She is on Requip, zoloft and ambien and gabapentin    Code Status: full  Family Communication: family updated about patient's clinical progress  Disposition Plan: Probably when her diarrhea resolves  Brief narrative:  57 y.o.  Obese white female with PMH of COPD(baseline 3L [place]) diabetes, obesity and CAD was admitted by [facility] for 2 months of diarrhea. Patient had a recent admission to [facility] for ventilator weaning and rehab. Patient was diagnosed with C. Diff while in the hospital and received a full course of treatment but states that she has continued to have diarrhea since she was discharged home (09/13/12 addendum:not sure if this is correct info for May 2014, epic documents c diff only inJan 2014)  On 09/09/2012: Patient presented to ED at urging of PCP and was found to be severely hypotensive with elevated SCr. TRH treated with IV hydration for hypotension and AKI. Despite IV hydration patient remained severely hypotensive, and subsequently became dyspneic and was put on bipap therapy. PCCM initiated sepsis protocol, intubated the patient, and placed a central venous line on 7/914  LINES / TUBES:  7/09 R IJ >>>  7/09 ETT >>>09/12/12  CULTURES:  09/09/2012 Clostridium difficile PCR is positive [also positive 03/20/2012 and 04/04/2012]  7/09 Blood >> negative  7/09 Urine >> negative  ANTIBIOTICS:  7/09 Vanc (enteral) >>> 09/12/2012  7/09 Vanc enema >>> 09/12/2012  7/09 metronidazole >>> 09/12/2012  ...............Marland Kitchen  DIFICID 09/12/12 (not given due to $ and approval)  Vancomycin PO pulse by ID 09/12/12 >>( taper will be done by ID in their office)  SIGNIFICANT EVENTS / STUDIES:  07/2012 d/c from [facility]  -----------------------------------------------  7/08 Admitted to [facility]  7/09 Failed Bipap therapy, intubated  7/09 Hypotensive episode requiring pressor  09/11/12: On levophed , On prn sedation. Meet spontaneous breathing trial criteria although the fact  she is on pressors. Still with diarrhea but mostly when she gets enema  09/12/2012: Diarrhea only with enema. Nontoxic. Off pressors. Meets extubation criteria.  SUBJECTIVE/OVERNIGHT/INTERVAL HX  09/13/2012: Doing well post extubation. Had some wheeze possibly  upper airway in the morning but resolved with nebulizer. She feels well except for significnat diarrhea     Objective: Filed Vitals:   09/14/12 2019 09/15/12 0140 09/15/12 0504 09/15/12 0635  BP: 129/69  140/74   Pulse: 68  82 75  Temp: 98.5 F (36.9 C)  98.5 F (36.9 C)   TempSrc: Oral  Oral   Resp: 20  18 18   Height:      Weight: 111.267 kg (245 lb 4.8 oz)     SpO2: 96% 96% 96% 95%    Intake/Output Summary (Last 24 hours) at 09/15/12 0758 Last data filed at 09/15/12 0700  Gross per 24 hour  Intake   1660 ml  Output      0 ml  Net   1660 ml    Exam:  General: Obese, female sitting on a chair and watching television and looking comfortable  Neuro: n: Alert and oriented x3. CAM-ICU negative for delirium. RASS sedation score 0. Speech normal and sitting  HEENT: MM dry and pink, no noted JVD, no carotid bruits. 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 Cuff leak test positive  Cardiovascular: RRR, no M/R/G  Lungs: Diminished breath sounds at bases, no wheezes  Abdomen: Soft, nontender with normal bowel sounds  Musculoskeletal: No gross deformities, moves extremities x4  Skin: No obvious breakdowns noted    Data Reviewed: Basic Metabolic Panel:  Recent Labs Lab 09/09/12 1610 09/09/12 2315 09/10/12 0340 09/10/12 1614 09/12/12 0508 09/14/12 0738  NA 128*  --  132* 136 143 141  K 3.6  --  3.9 4.2 3.8 3.6  CL 88*  --  96 103 110 104  CO2 26  --  24 22 24 27   GLUCOSE 144*  --  112* 117* 187* 161*  BUN 63*  --  61* 56* 26* 9  CREATININE 2.56* 2.31* 2.47* 2.05* 0.86 0.66  CALCIUM 8.2*  --  7.6* 7.6* 8.4 8.8  MG  --   --   --   --  1.7 1.6  PHOS  --   --   --   --  2.6 3.8    Liver Function Tests:  Recent Labs Lab 09/09/12 1610 09/10/12 0340 09/10/12 1614  AST 15 14 13   ALT 7 8 9   ALKPHOS 86 55 49  BILITOT 0.1* 0.1* 0.2*  PROT 5.2* 5.1* 5.0*  ALBUMIN 1.9* 1.9* 1.9*   No results found for this basename: LIPASE, AMYLASE,  in the last 168 hours No results found  for this basename: AMMONIA,  in the last 168 hours  CBC:  Recent Labs Lab 09/09/12 1610 09/09/12 2315 09/10/12 0340 09/10/12 1614 09/12/12 0043 09/14/12 0724  WBC 16.0* 14.6* 13.2* 14.1* 12.2* 8.6  NEUTROABS 14.4*  --   --   --   --   --   HGB 11.4* 11.2* 10.9* 10.9* 10.3* 11.1*  HCT 35.6* 34.4* 34.6* 35.0* 32.5* 34.6*  MCV 84.0 84.3 86.3 87.1 85.8 85.0  PLT 264 273 270 298 300 419*    Cardiac Enzymes:  Recent Labs Lab 09/10/12 1614 09/11/12 1005 09/11/12 1642 09/12/12 0044  TROPONINI <0.30 <0.30 <0.30 <0.30   BNP (last 3 results)  Recent Labs  03/23/12 0612 03/27/12 0627 09/12/12 0508  PROBNP 184.7* 100.1 25.8  CBG:  Recent Labs Lab 09/13/12 1639 09/13/12 2110 09/14/12 0707 09/14/12 1130 09/14/12 1625  GLUCAP 133* 156* 138* 163* 204*    Recent Results (from the past 240 hour(s))  CLOSTRIDIUM DIFFICILE BY PCR     Status: Abnormal   Collection Time    09/09/12  4:15 PM      Result Value Range Status   C difficile by pcr POSITIVE (*) NEGATIVE Final   Comment: CRITICAL RESULT CALLED TO, READ BACK BY AND VERIFIED WITH:     L.YOUNG RN 0309 09/10/12 E.GADDY  CULTURE, BLOOD (ROUTINE X 2)     Status: None   Collection Time    09/09/12  6:49 PM      Result Value Range Status   Specimen Description BLOOD HAND LEFT   Final   Special Requests BOTTLES DRAWN AEROBIC ONLY 3CC   Final   Culture  Setup Time 09/10/2012 01:16   Final   Culture     Final   Value:        BLOOD CULTURE RECEIVED NO GROWTH TO DATE CULTURE WILL BE HELD FOR 5 DAYS BEFORE ISSUING A FINAL NEGATIVE REPORT   Report Status PENDING   Incomplete  CULTURE, BLOOD (ROUTINE X 2)     Status: None   Collection Time    09/09/12  7:10 PM      Result Value Range Status   Specimen Description BLOOD HAND RIGHT   Final   Special Requests BOTTLES DRAWN AEROBIC ONLY 3CC   Final   Culture  Setup Time 09/10/2012 01:16   Final   Culture     Final   Value:        BLOOD CULTURE RECEIVED NO GROWTH TO DATE  CULTURE WILL BE HELD FOR 5 DAYS BEFORE ISSUING A FINAL NEGATIVE REPORT   Report Status PENDING   Incomplete  MRSA PCR SCREENING     Status: None   Collection Time    09/09/12 11:11 PM      Result Value Range Status   MRSA by PCR NEGATIVE  NEGATIVE Final   Comment:            The GeneXpert MRSA Assay (FDA     approved for NASAL specimens     only), is one component of a     comprehensive MRSA colonization     surveillance program. It is not     intended to diagnose MRSA     infection nor to guide or     monitor treatment for     MRSA infections.  URINE CULTURE     Status: None   Collection Time    09/10/12  9:19 AM      Result Value Range Status   Specimen Description URINE, CATHETERIZED   Final   Special Requests NONE   Final   Culture  Setup Time 09/10/2012 10:15   Final   Colony Count NO GROWTH   Final   Culture NO GROWTH   Final   Report Status 09/11/2012 FINAL   Final  URINE CULTURE     Status: None   Collection Time    09/10/12  4:27 PM      Result Value Range Status   Specimen Description URINE, CATHETERIZED   Final   Special Requests NONE   Final   Culture  Setup Time 09/10/2012 23:24   Final   Colony Count NO GROWTH   Final   Culture NO GROWTH   Final   Report Status  09/11/2012 FINAL   Final     Studies: Dg Chest Port 1 View  09/14/2012   *RADIOLOGY REPORT*  Clinical Data: Shortness of breath  PORTABLE CHEST - 1 VIEW  Comparison: 09/11/2012  Findings: Shallow inspiration.  Heart size and pulmonary vascularity are normal.  There is interval development of consolidation in the medial right lung base, likely in the right middle lung, suggesting pneumonia.  Mild atelectasis in the left lung base.  No pneumothorax.  No blunting of costophrenic angles.  IMPRESSION: Infiltration and volume loss in the right lung base medially suggesting pneumonia.   Original Report Authenticated By: Burman Nieves, M.D.   Portable Chest Xray In Am  09/11/2012   *RADIOLOGY REPORT*   Clinical Data: Endotracheal tube placement, shortness of breath  PORTABLE CHEST - 1 VIEW  Comparison: Portable chest x-ray of 09/10/2012  Findings: The tip of the endotracheal tube is approximately 2.9 cm above the carina.  Aeration has improved slightly with mild basilar atelectasis remaining.  The right central venous line is unchanged in position and cardiomegaly is stable.  IMPRESSION: Endotracheal tube tip 2.9 cm above carina.  Slightly improved aeration.   Original Report Authenticated By: Dwyane Dee, M.D.   Dg Chest Portable 1 View  09/10/2012   *RADIOLOGY REPORT*  Clinical Data: Sepsis, central line placement  PORTABLE CHEST - 1 VIEW  Comparison: Portable exam 1457 hours compared to 09/09/2012  Findings: Tip of endotracheal tube approximately 2.5 cm above carina. Nasogastric tube extends into stomach. Right jugular central venous catheter tip projects over SVC. Enlargement of cardiac silhouette with pulmonary vascular congestion. Mild diffuse bilateral interstitial infiltrates question pulmonary edema. Mild basilar hypoinflation. No gross pleural effusion or pneumothorax.  IMPRESSION: No pneumothorax following central line placement. Probable mild pulmonary edema and bibasilar atelectasis.   Original Report Authenticated By: Ulyses Southward, M.D.   Dg Chest Portable 1 View  09/09/2012   *RADIOLOGY REPORT*  Clinical Data: 57 year old female with respiratory failure. Diarrhea.  PORTABLE CHEST - 1 VIEW  Comparison: 04/08/2012 and earlier.  Findings: Semi upright AP portable view 1750 hours.  Stable lung volumes.  Stable cardiac size and mediastinal contours.  No pneumothorax or pulmonary edema.  No pleural effusion or confluent pulmonary opacity.  Stable mild contour deformity associated with a small fat containing right diaphragmatic hernia (see 03/24/2012 CTA).  IMPRESSION: No acute cardiopulmonary abnormality.   Original Report Authenticated By: Erskine Speed, M.D.   Dg Abd Portable 1v  09/10/2012    *RADIOLOGY REPORT*  Clinical Data: Diarrhea, evaluate for possible colonic distention  PORTABLE ABDOMEN - 1 VIEW  Comparison: Abdomen films of 05/03/2012  Findings: A supine film of the abdomen does show apparent edema within the mucosa of the right colon and transverse colon with some "thumbprinting".  However, no bowel distention is seen.  No opaque calculi are noted.  Surgical clips are present in the right upper quadrant from prior cholecystectomy with clips in the left lower quadrant and mid pelvis as well probably due to prior bilateral tubal ligation.  IMPRESSION:  1.  No bowel distention. 2.  Edema of the mucosa of the colon consistent with colitis.   Original Report Authenticated By: Dwyane Dee, M.D.    Scheduled Meds: . ipratropium  0.5 mg Nebulization Q6H   And  . albuterol  2.5 mg Nebulization Q6H  . aspirin  81 mg Oral Daily  . budesonide  0.25 mg Nebulization BID  . famotidine  20 mg Oral QHS  . furosemide  20  mg Oral Daily  . gabapentin  100 mg Oral TID  . heparin  5,000 Units Subcutaneous Q8H  . insulin aspart  0-20 Units Subcutaneous TID WC  . levofloxacin  500 mg Oral Daily  . rOPINIRole  1 mg Oral QHS  . sertraline  50 mg Oral Daily  . vancomycin  125 mg Oral Q6H   Continuous Infusions: . sodium chloride 20 mL/hr (09/14/12 0405)    Principal Problem:   Recurrent Clostridium difficile diarrhea Active Problems:   COPD (chronic obstructive pulmonary disease)   Obesity   Hyperlipidemia   Diabetes mellitus type 2 in obese   Chronic pain   Hypertension   Type 2 diabetes mellitus with hyperglycemia   Acute renal failure   Severe sepsis with septic shock   Acute respiratory failure with hypoxia   Hypercapnia   Anemia    Time spent: 40 minutes   [facility]  [facility] Pager [phone]. If 8PM-8AM, please contact night-coverage at [email or web address], password [facility] 09/15/2012, 7:58 AM  LOS: 6 days

## 2012-09-15 NOTE — Progress Notes (Signed)
Pt noted to have generalized edema with +3-4 pitting edema on BLE. Pt concerned that she's not receiving her usual doses of lasix during the day and becoming more anxious about the increasing edema on her BLE. MD on call notified and one time dose of IV Lasix ordered. Will continue to monitor. C.Bryann Gentz, RN.

## 2012-09-15 NOTE — Progress Notes (Signed)
Patient ID: [name], female   DOB: 1956-02-19, 57 y.o.   MRN: [id number]         [facility]    Date of Admission:  09/09/2012           Day 7 C. difficile therapy        Day 2 HCAP therapy  Principal Problem:   Recurrent Clostridium difficile diarrhea Active Problems:   COPD (chronic obstructive pulmonary disease)   Obesity   Hyperlipidemia   Diabetes mellitus type 2 in obese   Chronic pain   Hypertension   Type 2 diabetes mellitus with hyperglycemia   Acute renal failure   Severe sepsis with septic shock   Acute respiratory failure with hypoxia   Hypercapnia   Anemia   . ipratropium  0.5 mg Nebulization Q6H   And  . albuterol  2.5 mg Nebulization Q6H  . aspirin  81 mg Oral Daily  . budesonide  0.25 mg Nebulization BID  . famotidine  20 mg Oral QHS  . furosemide  40 mg Intravenous Daily  . gabapentin  100 mg Oral TID  . heparin  5,000 Units Subcutaneous Q8H  . insulin aspart  0-20 Units Subcutaneous TID WC  . levofloxacin  500 mg Oral Daily  . rOPINIRole  1 mg Oral QHS  . sertraline  50 mg Oral Daily  . vancomycin  125 mg Oral Q6H    Subjective: She is feeling better. She's had only 2 soft, semi-formed stools today. She has no abdominal pain, nausea or vomiting. She has had some cough over the past week that is intermittently productive of white sputum. He had some increased shortness of breath over the weekend but feels better after antibiotics and Lasix.   Past Medical History  Diagnosis Date  . COPD (chronic obstructive pulmonary disease)   . Respiratory failure   . Morbid obesity   . Tobacco abuse   . GERD (gastroesophageal reflux disease)   . Hyperlipidemia   . Chronic pain   . Hypertension   . Heart attack   . Arthritis   . Allergic rhinitis   . Diabetes mellitus, type 2   . Diabetes mellitus type 2 in obese 02/29/2012  . HTN (hypertension) 02/29/2012    History  Substance Use Topics  . Smoking status: Former  Smoker -- 1.00 packs/day for 40 years    Types: Cigarettes    Quit date: 03/04/2012  . Smokeless tobacco: Never Used  . Alcohol Use: No    History reviewed. No pertinent family history.  Allergies  Allergen Reactions  . Other Swelling and Other (See Comments)    Patient took cardizem and metformin after last hospital stay that caused her to very bad swelling all over body.     Objective: Temp:  [98.2 F (36.8 C)-98.7 F (37.1 C)] 98.6 F (37 C) (07/14 0945) Pulse Rate:  [68-92] 80 (07/14 0945) Resp:  [18-20] 18 (07/14 0945) BP: (129-145)/(69-82) 145/82 mmHg (07/14 0945) SpO2:  [94 %-96 %] 96 % (07/14 0945) Weight:  [111.267 kg (245 lb 4.8 oz)] 111.267 kg (245 lb 4.8 oz) (07/13 2019)  General: She appears more comfortable sitting up on the side of the bed Skin: No rash Lungs: Clear Cor: Distant but regular S1 and S2 no murmurs Abdomen: Obese, soft and nontender   Lab Results Lab Results  Component Value Date   WBC 8.6 09/14/2012   HGB 11.1* 09/14/2012   HCT 34.6* 09/14/2012   MCV  85.0 09/14/2012   PLT 419* 09/14/2012    Lab Results  Component Value Date   CREATININE 0.66 09/14/2012   BUN 9 09/14/2012   NA 141 09/14/2012   K 3.6 09/14/2012   CL 104 09/14/2012   CO2 27 09/14/2012    Lab Results  Component Value Date   ALT 9 09/10/2012   AST 13 09/10/2012   ALKPHOS 49 09/10/2012   BILITOT 0.2* 09/10/2012      Microbiology: Recent Results (from the past 240 hour(s))  CLOSTRIDIUM DIFFICILE BY PCR     Status: Abnormal   Collection Time    09/09/12  4:15 PM      Result Value Range Status   C difficile by pcr POSITIVE (*) NEGATIVE Final   Comment: CRITICAL RESULT CALLED TO, READ BACK BY AND VERIFIED WITH:     L.YOUNG RN 0309 09/10/12 E.GADDY  CULTURE, BLOOD (ROUTINE X 2)     Status: None   Collection Time    09/09/12  6:49 PM      Result Value Range Status   Specimen Description BLOOD HAND LEFT   Final   Special Requests BOTTLES DRAWN AEROBIC ONLY 3CC   Final   Culture   Setup Time 09/10/2012 01:16   Final   Culture     Final   Value:        BLOOD CULTURE RECEIVED NO GROWTH TO DATE CULTURE WILL BE HELD FOR 5 DAYS BEFORE ISSUING A FINAL NEGATIVE REPORT   Report Status PENDING   Incomplete  CULTURE, BLOOD (ROUTINE X 2)     Status: None   Collection Time    09/09/12  7:10 PM      Result Value Range Status   Specimen Description BLOOD HAND RIGHT   Final   Special Requests BOTTLES DRAWN AEROBIC ONLY 3CC   Final   Culture  Setup Time 09/10/2012 01:16   Final   Culture     Final   Value:        BLOOD CULTURE RECEIVED NO GROWTH TO DATE CULTURE WILL BE HELD FOR 5 DAYS BEFORE ISSUING A FINAL NEGATIVE REPORT   Report Status PENDING   Incomplete  MRSA PCR SCREENING     Status: None   Collection Time    09/09/12 11:11 PM      Result Value Range Status   MRSA by PCR NEGATIVE  NEGATIVE Final   Comment:            The GeneXpert MRSA Assay (FDA     approved for NASAL specimens     only), is one component of a     comprehensive MRSA colonization     surveillance program. It is not     intended to diagnose MRSA     infection nor to guide or     monitor treatment for     MRSA infections.  URINE CULTURE     Status: None   Collection Time    09/10/12  9:19 AM      Result Value Range Status   Specimen Description URINE, CATHETERIZED   Final   Special Requests NONE   Final   Culture  Setup Time 09/10/2012 10:15   Final   Colony Count NO GROWTH   Final   Culture NO GROWTH   Final   Report Status 09/11/2012 FINAL   Final  URINE CULTURE     Status: None   Collection Time    09/10/12  4:27 PM  Result Value Range Status   Specimen Description URINE, CATHETERIZED   Final   Special Requests NONE   Final   Culture  Setup Time 09/10/2012 23:24   Final   Colony Count NO GROWTH   Final   Culture NO GROWTH   Final   Report Status 09/11/2012 FINAL   Final  CULTURE, BLOOD (ROUTINE X 2)     Status: None   Collection Time    09/14/12  7:35 AM      Result Value Range  Status   Specimen Description BLOOD LEFT HAND   Final   Special Requests BOTTLES DRAWN AEROBIC ONLY 1CC   Final   Culture  Setup Time 09/14/2012 15:16   Final   Culture     Final   Value:        BLOOD CULTURE RECEIVED NO GROWTH TO DATE CULTURE WILL BE HELD FOR 5 DAYS BEFORE ISSUING A FINAL NEGATIVE REPORT   Report Status PENDING   Incomplete  CULTURE, BLOOD (ROUTINE X 2)     Status: None   Collection Time    09/14/12  7:40 AM      Result Value Range Status   Specimen Description BLOOD LEFT HAND   Final   Special Requests BOTTLES DRAWN AEROBIC ONLY 3CC   Final   Culture  Setup Time 09/14/2012 15:14   Final   Culture     Final   Value:        BLOOD CULTURE RECEIVED NO GROWTH TO DATE CULTURE WILL BE HELD FOR 5 DAYS BEFORE ISSUING A FINAL NEGATIVE REPORT   Report Status PENDING   Incomplete    Studies/Results: Dg Chest Port 1 View  09/14/2012   *RADIOLOGY REPORT*  Clinical Data: Shortness of breath  PORTABLE CHEST - 1 VIEW  Comparison: 09/11/2012  Findings: Shallow inspiration.  Heart size and pulmonary vascularity are normal.  There is interval development of consolidation in the medial right lung base, likely in the right middle lung, suggesting pneumonia.  Mild atelectasis in the left lung base.  No pneumothorax.  No blunting of costophrenic angles.  IMPRESSION: Infiltration and volume loss in the right lung base medially suggesting pneumonia.   Original Report Authenticated By: Burman Nieves, M.D.    Assessment: Her recurrent C. difficile colitis is improving. I favor continuing oral vancomycin as a single agent. I will arrange followup in our clinic to manage a slow tapering course.  She was started on therapy for possible HCAP this weekend, initially with vancomycin and Zosyn and then switching to levofloxacin. I am not actually certain that she has pneumonia. Since any systemic antibiotic therapy and will complicate management of her colitis I would favor at most a short 3-5 day  course of levofloxacin.  I talked to her again about the importance of avoiding antimotility agents.  Plan: 1. Continue oral vancomycin 2. Recommend discontinuing rifaximin 3. Short course of levofloxacin  Cliffton Asters, MD [facility] [phone] pager   [phone] cell 09/15/2012, 12:05 PM

## 2012-09-15 NOTE — Progress Notes (Signed)
OT Cancellation Note and Discharge  Patient Details Name: ALIANNY TOELLE MRN: [id number] DOB: 1955/03/25   Cancelled Treatment:    Reason Eval/Treat Not Completed: OT screened, no needs identified, will sign off and pt/husband agree the is no need.  Evette Georges [phone] 09/15/2012, 11:51 AM

## 2012-09-15 NOTE — Progress Notes (Signed)
NUTRITION FOLLOW UP  Intervention:   No nutrition interventions at this time  Nutrition Dx:   Unintended wt change related to poor appetite and intake as evidenced by pt report, 20 lbs wt loss. Improving  Inadequate oral intake related to inability to eat as evidenced by mechanical ventilation. Resolved.    Goal:   Meet >/=90% estimated nutrition needs. Likely met  Monitor:   PO intake, weight trends, labs, I/O's  Assessment:   Diet has been advanced to Carb Mod, eating >75% most meals.  Developed increased SOB overnight, was on IV fluids and had LE edema. IV lasix given. Per notes, diarrhea is improving.   Pt states appetite is good, no nutrition issues at this time.   Height: Ht Readings from Last 1 Encounters:  09/10/12 5' 5.5" (1.664 m)    Weight Status:   Wt Readings from Last 1 Encounters:  09/14/12 245 lb 4.8 oz (111.267 kg)  Admission weight of 230 lbs, likely fluid  Re-estimated needs:  Kcal: 1960-2180  Protein: 90-105g  Fluid: >1.8 L/day  Skin: intact   Diet Order: Carb Control   Intake/Output Summary (Last 24 hours) at 09/15/12 1249 Last data filed at 09/15/12 0700  Gross per 24 hour  Intake   1540 ml  Output      0 ml  Net   1540 ml    Last BM: 7/13    Labs:   Recent Labs Lab 09/10/12 1614 09/12/12 0508 09/14/12 0738  NA 136 143 141  K 4.2 3.8 3.6  CL 103 110 104  CO2 22 24 27   BUN 56* 26* 9  CREATININE 2.05* 0.86 0.66  CALCIUM 7.6* 8.4 8.8  MG  --  1.7 1.6  PHOS  --  2.6 3.8  GLUCOSE 117* 187* 161*    CBG (last 3)   Recent Labs  09/14/12 1625 09/15/12 0803 09/15/12 1148  GLUCAP 204* 172* 161*    Scheduled Meds: . ipratropium  0.5 mg Nebulization Q6H   And  . albuterol  2.5 mg Nebulization Q6H  . aspirin  81 mg Oral Daily  . budesonide  0.25 mg Nebulization BID  . famotidine  20 mg Oral QHS  . furosemide  40 mg Intravenous Daily  . gabapentin  100 mg Oral TID  . heparin  5,000 Units Subcutaneous Q8H  . insulin  aspart  0-20 Units Subcutaneous TID WC  . levofloxacin  500 mg Oral Daily  . rOPINIRole  1 mg Oral QHS  . sertraline  50 mg Oral Daily  . vancomycin  125 mg Oral Q6H    Clarene Duke RD, LDN Pager [phone] After Hours pager [phone]

## 2012-09-16 DIAGNOSIS — J9620 Acute and chronic respiratory failure, unspecified whether with hypoxia or hypercapnia: Secondary | ICD-10-CM

## 2012-09-16 LAB — GLUCOSE, CAPILLARY: Glucose-Capillary: 154 mg/dL — ABNORMAL HIGH (ref 70–99)

## 2012-09-16 LAB — CULTURE, BLOOD (ROUTINE X 2)
Culture: NO GROWTH
Culture: NO GROWTH

## 2012-09-16 MED ORDER — FAMOTIDINE 20 MG PO TABS: 20.0000 mg | ORAL_TABLET | Freq: Every day | ORAL | Status: AC

## 2012-09-16 MED ORDER — FUROSEMIDE 40 MG PO TABS
40.0000 mg | ORAL_TABLET | Freq: Two times a day (BID) | ORAL | Status: DC
  Administered 2012-09-16: 40 mg via ORAL
  Filled 2012-09-16 (×2): qty 1

## 2012-09-16 MED ORDER — FUROSEMIDE 40 MG PO TABS: 40.0000 mg | ORAL_TABLET | Freq: Two times a day (BID) | ORAL | Status: DC

## 2012-09-16 MED ORDER — LEVOFLOXACIN 500 MG PO TABS: 500.0000 mg | ORAL_TABLET | Freq: Every day | ORAL | Status: DC

## 2012-09-16 MED ORDER — BUDESONIDE 0.25 MG/2ML IN SUSP: 0.2500 mg | Freq: Two times a day (BID) | RESPIRATORY_TRACT | Status: DC

## 2012-09-16 MED ORDER — VANCOMYCIN 50 MG/ML ORAL SOLUTION: 125.0000 mg | Freq: Four times a day (QID) | ORAL | Status: DC

## 2012-09-16 MED ORDER — ISOSORBIDE MONONITRATE 15 MG HALF TABLET: 15.0000 mg | ORAL_TABLET | Freq: Every day | ORAL | Status: DC

## 2012-09-16 NOTE — Discharge Summary (Signed)
Physician Discharge Summary  [name]:[id number] DOB: 27-Dec-1955 DOA: 09/09/2012  PCP: [name], NP  Admit date: 09/09/2012 Discharge date: 09/16/2012  Time spent: >30 minutes  Recommendations for Outpatient Follow-up:  -BMET to follow electrolytes and renal function -Reassess BP and adjust medication as needed  Discharge Diagnoses:  Principal Problem:   Recurrent Clostridium difficile diarrhea Active Problems:   COPD (chronic obstructive pulmonary disease)   Obesity   Hyperlipidemia   Diabetes mellitus type 2 in obese   Chronic pain   Hypertension   Type 2 diabetes mellitus with hyperglycemia   Acute renal failure   Severe sepsis with septic shock   Acute respiratory failure with hypoxia   Hypercapnia   Anemia   Discharge Condition: stable and improved. Will discharge home with outpatient follow up with PCP, pulmonologist and Infectious disease doctor.  Diet recommendation: low carbohydrate sand low sodium diet  Filed Weights   09/13/12 2111 09/14/12 2019 09/16/12 0500  Weight: 111.267 kg (245 lb 4.8 oz) 111.267 kg (245 lb 4.8 oz) 106.777 kg (235 lb 6.4 oz)    History of present illness:  57 y.o. female with multiple medical problems including COPD on 3 L [place], hyperlipidemia, respiratory failure recently admitted to PCCM, morbid obesity, HTN, DM2, chronic pain syndrome, brought in by her husband as she has been having persistent foul smelling diarrhea for the past 2 months. He said she has been having diarrhea since her last hospitalization. She has not been able to take in adequate PO at home. She denied any black of bloody stool, but admitted to some fever and abdominal cramps. Her husband had been urging her to seek help, but she didn't call her PCP until today, whereupon she was recommended to proceed to the ER for evaluation. In the ER, she was found to be severely hypotensive, with SBP 70's, BUN/Cr 63/2/56, WBC of 16K, and Na of 128. PCCM was consulted and  deferred admission to [facility]. She was given IVF, and an artline was placed. The resultant BP was 88. Hospitalist was asked to admit her for volume depletion, AKI, and diarrhea suspicious for C diff infection.   Hospital Course:  Acute on Chronic Hypercarbic Respiratory failure due to HCAP and COPD on (chronically on home oxygen 2-3L) -now with concerns for HCAP -require intubation during this admission; 09/12/2012: Status post extubation. Currently no wheezing and good O2 sat on chronic oxygen supplementation.  -Continue PRN DuoNeb  -Continue BID pulmicort -FU Dr Vassie Loll pulmonary clinic to be arrranged < 1 week of discharge  -levaquin for 2 more days to finish antibiotic therapy for HCAP  Septic Shock due to C. difficile  -resolved.  H/o hyperlipidemia  -will recommend low fat diet -follow up with PCP to determine statins needs is required.  H/o hypertension  -BP stable now -lasix restarted -advise to follow low sodium diet  Acute kidney injury -resolved  -lasix has been resumed -BMET in outpatient setting during follow up will be needed to follow kidney function  C. difficile enterocolitis  Infectious disease followed patient during hospitalization and decision has been for oral vancomycin and slow tapering. -follow up as an outpatient with Dr. Orvan Falconer will be arranged. -avoidance of antimotility agents in the near future and careful use of other systemic antibiotics has been recommended and discussed with patient -PPI has been discontinued  Diabetes mellitus  -will resume home oral agents  -follow up with PCP for further medication adjustments  Acute encephalopathy/chronic pain  -Due to infection, hypercapnia/hypoxia and medications. -  now resolved/table -Continue chronic PRN analgesic regimen.  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 -continue neurontin  Depression: Continue zoloft  Insomnia: -continue PRN  ambien.    Procedures: See below for x-ray reports  Consultations:  PCCM  ID  Discharge Exam: Filed Vitals:   09/15/12 2136 09/16/12 0244 09/16/12 0500 09/16/12 0855  BP: 152/71  131/76 135/61  Pulse: 70  77 74  Temp: 98.2 F (36.8 C)  98.1 F (36.7 C) 98 F (36.7 C)  TempSrc: Oral  Oral   Resp: 18  20 20   Height:      Weight:   106.777 kg (235 lb 6.4 oz)   SpO2: 95% 95% 99% 98%   General: NAD; complaining of Le edema. Still with some diarrhea Neuro: n: Alert and oriented x3. Able to follow commands properly, no focal deficit or abnormalities seen HEENT: MM dry and pink, no noted JVD, no carotid bruits. 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  Cardiovascular: RRR, no M/R/G  Lungs: good air movement, no wheezes ; no rales Abdomen: Soft, nontender with normal bowel sounds  Musculoskeletal: No gross deformities, moves extremities x4; LE edema 2-3+ bilaterally Skin: No obvious breakdowns noted   Discharge Instructions  Discharge Orders   Future Orders Complete By Expires     Diet - low sodium heart healthy  As directed     Discharge instructions  As directed     Comments:      Follow a heart healthy diet Take medications as prescribed Arrange follow up with PCP in 2 weeks Keep yourself well hydrated Follow with infectious disease and pulmonologist as an outpatient (office will arrange visit, call office for appointment details)        Medication List    STOP taking these medications       pantoprazole 40 MG tablet  Commonly known as:  PROTONIX      TAKE these medications       acetaminophen 325 MG tablet  Commonly known as:  TYLENOL  Take 650 mg by mouth every 6 (six) hours as needed for pain.     antiseptic oral rinse Liqd  15 mLs by Mouth Rinse route QID.     aspirin 81 MG chewable tablet  Chew 1 tablet (81 mg total) by mouth daily.     budesonide 0.25 MG/2ML nebulizer solution  Commonly known as:  PULMICORT  Take 2 mLs (0.25 mg total) by  nebulization 2 (two) times daily.     diclofenac sodium 1 % Gel  Commonly known as:  VOLTAREN  Apply 4 g topically 2 (two) times daily as needed (for foot pain).     famotidine 20 MG tablet  Commonly known as:  PEPCID  Take 1 tablet (20 mg total) by mouth at bedtime.     furosemide 40 MG tablet  Commonly known as:  LASIX  Take 1 tablet (40 mg total) by mouth 2 (two) times daily.     gabapentin 100 MG capsule  Commonly known as:  NEURONTIN  Take 1 capsule (100 mg total) by mouth 3 (three) times daily.     glimepiride 2 MG tablet  Commonly known as:  AMARYL  Take 2 mg by mouth 2 (two) times daily with a meal.     HYDROcodone-acetaminophen 7.5-325 MG per tablet  Commonly known as:  NORCO  Take 1 tablet by mouth every 4 (four) hours as needed for pain.     insulin aspart 100 UNIT/ML injection  Commonly known as:  novoLOG  - Inject 0-8 Units into the skin 3 (three) times daily with meals. On sliding scale:  -  0-100 0 units  - 101-150 take 3 units  - 151-200 take 5 units  - 201>300 take 8 units  - 400 or higher call MD     insulin glargine 100 UNIT/ML injection  Commonly known as:  LANTUS  Inject 32 Units into the skin at bedtime.     ipratropium-albuterol 0.5-2.5 (3) MG/3ML Soln  Commonly known as:  DUONEB  Take 3 mLs by nebulization 3 (three) times daily.     isosorbide mononitrate 15 mg Tb24  Commonly known as:  IMDUR  Take 0.5 tablets (15 mg total) by mouth daily.  Start taking on:  09/17/2012     levofloxacin 500 MG tablet  Commonly known as:  LEVAQUIN  Take 1 tablet (500 mg total) by mouth daily.     rOPINIRole 1 MG tablet  Commonly known as:  REQUIP  Take 1 mg by mouth at bedtime.     sertraline 50 MG tablet  Commonly known as:  ZOLOFT  Take 50 mg by mouth daily.     vancomycin 50 mg/mL oral solution  Commonly known as:  VANCOCIN  Take 2.5 mLs (125 mg total) by mouth every 6 (six) hours.     zolpidem 5 MG tablet  Commonly known as:  AMBIEN  Take  5 mg by mouth at bedtime as needed for sleep.       Allergies  Allergen Reactions  . Other Swelling and Other (See Comments)    Patient took cardizem and metformin after last hospital stay that caused her to very bad swelling all over body.        Follow-up Information   Follow up with [facility], NP. Schedule an appointment as soon as possible for a visit in 2 weeks.   Contact information:   [address] [place] [address] [phone]       Follow up with Oretha Milch., MD. (call office for appointment details)    Contact information:   [address] [place] [address] [phone]       Follow up with Cliffton Asters, MD. (call office for appointment details)    Contact information:   [address] [place] [address] [phone]        The results of significant diagnostics from this hospitalization (including imaging, microbiology, ancillary and laboratory) are listed below for reference.    Significant Diagnostic Studies: Dg Chest Port 1 View  09/14/2012   *RADIOLOGY REPORT*  Clinical Data: Shortness of breath  PORTABLE CHEST - 1 VIEW  Comparison: 09/11/2012  Findings: Shallow inspiration.  Heart size and pulmonary vascularity are normal.  There is interval development of consolidation in the medial right lung base, likely in the right middle lung, suggesting pneumonia.  Mild atelectasis in the left lung base.  No pneumothorax.  No blunting of costophrenic angles.  IMPRESSION: Infiltration and volume loss in the right lung base medially suggesting pneumonia.   Original Report Authenticated By: Burman Nieves, M.D.   Portable Chest Xray In Am  09/11/2012   *RADIOLOGY REPORT*  Clinical Data: Endotracheal tube placement, shortness of breath  PORTABLE CHEST - 1 VIEW  Comparison: Portable chest x-ray of 09/10/2012  Findings: The tip of the endotracheal tube is approximately 2.9 cm above the carina.  Aeration has improved slightly with  mild basilar atelectasis remaining.  The right central  venous line is unchanged in position and cardiomegaly is stable.  IMPRESSION: Endotracheal tube tip 2.9 cm above carina.  Slightly improved aeration.   Original Report Authenticated By: Dwyane Dee, M.D.   Dg Chest Portable 1 View  09/10/2012   *RADIOLOGY REPORT*  Clinical Data: Sepsis, central line placement  PORTABLE CHEST - 1 VIEW  Comparison: Portable exam 1457 hours compared to 09/09/2012  Findings: Tip of endotracheal tube approximately 2.5 cm above carina. Nasogastric tube extends into stomach. Right jugular central venous catheter tip projects over SVC. Enlargement of cardiac silhouette with pulmonary vascular congestion. Mild diffuse bilateral interstitial infiltrates question pulmonary edema. Mild basilar hypoinflation. No gross pleural effusion or pneumothorax.  IMPRESSION: No pneumothorax following central line placement. Probable mild pulmonary edema and bibasilar atelectasis.   Original Report Authenticated By: Ulyses Southward, M.D.   Dg Chest Portable 1 View  09/09/2012   *RADIOLOGY REPORT*  Clinical Data: 57 year old female with respiratory failure. Diarrhea.  PORTABLE CHEST - 1 VIEW  Comparison: 04/08/2012 and earlier.  Findings: Semi upright AP portable view 1750 hours.  Stable lung volumes.  Stable cardiac size and mediastinal contours.  No pneumothorax or pulmonary edema.  No pleural effusion or confluent pulmonary opacity.  Stable mild contour deformity associated with a small fat containing right diaphragmatic hernia (see 03/24/2012 CTA).  IMPRESSION: No acute cardiopulmonary abnormality.   Original Report Authenticated By: Erskine Speed, M.D.   Dg Abd Portable 1v  09/10/2012   *RADIOLOGY REPORT*  Clinical Data: Diarrhea, evaluate for possible colonic distention  PORTABLE ABDOMEN - 1 VIEW  Comparison: Abdomen films of 05/03/2012  Findings: A supine film of the abdomen does show apparent edema within the mucosa of the right colon and  transverse colon with some "thumbprinting".  However, no bowel distention is seen.  No opaque calculi are noted.  Surgical clips are present in the right upper quadrant from prior cholecystectomy with clips in the left lower quadrant and mid pelvis as well probably due to prior bilateral tubal ligation.  IMPRESSION:  1.  No bowel distention. 2.  Edema of the mucosa of the colon consistent with colitis.   Original Report Authenticated By: Dwyane Dee, M.D.    Microbiology: Recent Results (from the past 240 hour(s))  CLOSTRIDIUM DIFFICILE BY PCR     Status: Abnormal   Collection Time    09/09/12  4:15 PM      Result Value Range Status   C difficile by pcr POSITIVE (*) NEGATIVE Final   Comment: CRITICAL RESULT CALLED TO, READ BACK BY AND VERIFIED WITH:     L.YOUNG RN 0309 09/10/12 E.GADDY  CULTURE, BLOOD (ROUTINE X 2)     Status: None   Collection Time    09/09/12  6:49 PM      Result Value Range Status   Specimen Description BLOOD HAND LEFT   Final   Special Requests BOTTLES DRAWN AEROBIC ONLY 3CC   Final   Culture  Setup Time 09/10/2012 01:16   Final   Culture NO GROWTH 5 DAYS   Final   Report Status 09/16/2012 FINAL   Final  CULTURE, BLOOD (ROUTINE X 2)     Status: None   Collection Time    09/09/12  7:10 PM      Result Value Range Status   Specimen Description BLOOD HAND RIGHT   Final   Special Requests BOTTLES DRAWN AEROBIC ONLY 3CC   Final   Culture  Setup Time 09/10/2012 01:16   Final   Culture  NO GROWTH 5 DAYS   Final   Report Status 09/16/2012 FINAL   Final  MRSA PCR SCREENING     Status: None   Collection Time    09/09/12 11:11 PM      Result Value Range Status   MRSA by PCR NEGATIVE  NEGATIVE Final   Comment:            The GeneXpert MRSA Assay (FDA     approved for NASAL specimens     only), is one component of a     comprehensive MRSA colonization     surveillance program. It is not     intended to diagnose MRSA     infection nor to guide or     monitor treatment for      MRSA infections.  URINE CULTURE     Status: None   Collection Time    09/10/12  9:19 AM      Result Value Range Status   Specimen Description URINE, CATHETERIZED   Final   Special Requests NONE   Final   Culture  Setup Time 09/10/2012 10:15   Final   Colony Count NO GROWTH   Final   Culture NO GROWTH   Final   Report Status 09/11/2012 FINAL   Final  URINE CULTURE     Status: None   Collection Time    09/10/12  4:27 PM      Result Value Range Status   Specimen Description URINE, CATHETERIZED   Final   Special Requests NONE   Final   Culture  Setup Time 09/10/2012 23:24   Final   Colony Count NO GROWTH   Final   Culture NO GROWTH   Final   Report Status 09/11/2012 FINAL   Final  CULTURE, BLOOD (ROUTINE X 2)     Status: None   Collection Time    09/14/12  7:35 AM      Result Value Range Status   Specimen Description BLOOD LEFT HAND   Final   Special Requests BOTTLES DRAWN AEROBIC ONLY 1CC   Final   Culture  Setup Time 09/14/2012 15:16   Final   Culture     Final   Value:        BLOOD CULTURE RECEIVED NO GROWTH TO DATE CULTURE WILL BE HELD FOR 5 DAYS BEFORE ISSUING A FINAL NEGATIVE REPORT   Report Status PENDING   Incomplete  CULTURE, BLOOD (ROUTINE X 2)     Status: None   Collection Time    09/14/12  7:40 AM      Result Value Range Status   Specimen Description BLOOD LEFT HAND   Final   Special Requests BOTTLES DRAWN AEROBIC ONLY 3CC   Final   Culture  Setup Time 09/14/2012 15:14   Final   Culture     Final   Value:        BLOOD CULTURE RECEIVED NO GROWTH TO DATE CULTURE WILL BE HELD FOR 5 DAYS BEFORE ISSUING A FINAL NEGATIVE REPORT   Report Status PENDING   Incomplete     Labs: Basic Metabolic Panel:  Recent Labs Lab 09/09/12 1610 09/09/12 2315 09/10/12 0340 09/10/12 1614 09/12/12 0508 09/14/12 0738  NA 128*  --  132* 136 143 141  K 3.6  --  3.9 4.2 3.8 3.6  CL 88*  --  96 103 110 104  CO2 26  --  24 22 24 27   GLUCOSE 144*  --  112* 117* 187* 161*  BUN  63*  --  61* 56* 26* 9  CREATININE 2.56* 2.31* 2.47* 2.05* 0.86 0.66  CALCIUM 8.2*  --  7.6* 7.6* 8.4 8.8  MG  --   --   --   --  1.7 1.6  PHOS  --   --   --   --  2.6 3.8   Liver Function Tests:  Recent Labs Lab 09/09/12 1610 09/10/12 0340 09/10/12 1614  AST 15 14 13   ALT 7 8 9   ALKPHOS 86 55 49  BILITOT 0.1* 0.1* 0.2*  PROT 5.2* 5.1* 5.0*  ALBUMIN 1.9* 1.9* 1.9*   CBC:  Recent Labs Lab 09/09/12 1610 09/09/12 2315 09/10/12 0340 09/10/12 1614 09/12/12 0043 09/14/12 0724  WBC 16.0* 14.6* 13.2* 14.1* 12.2* 8.6  NEUTROABS 14.4*  --   --   --   --   --   HGB 11.4* 11.2* 10.9* 10.9* 10.3* 11.1*  HCT 35.6* 34.4* 34.6* 35.0* 32.5* 34.6*  MCV 84.0 84.3 86.3 87.1 85.8 85.0  PLT 264 273 270 298 300 419*   Cardiac Enzymes:  Recent Labs Lab 09/10/12 1614 09/11/12 1005 09/11/12 1642 09/12/12 0044  TROPONINI <0.30 <0.30 <0.30 <0.30   BNP: BNP (last 3 results)  Recent Labs  03/23/12 0612 03/27/12 0627 09/12/12 0508  PROBNP 184.7* 100.1 25.8   CBG:  Recent Labs Lab 09/15/12 1148 09/15/12 1700 09/15/12 2144 09/16/12 0736 09/16/12 1134  GLUCAP 161* 141* 163* 154* 188*    Signed:  Maudry Zeidan  [facility] 09/16/2012, 12:36 PM

## 2012-09-16 NOTE — Progress Notes (Signed)
09/16/2012 2:42 PM  Completed patient education on discharge instructions.  Pt had no further questions for me upon completion.  Belongings gathered and volunteer services notified to take patient downstairs. Prescriptions sent with patient at discharge.  Eunice Blase

## 2012-09-20 LAB — CULTURE, BLOOD (ROUTINE X 2): Culture: NO GROWTH

## 2012-09-26 ENCOUNTER — Telehealth: Payer: Self-pay | Admitting: Hematology & Oncology

## 2012-09-26 NOTE — Telephone Encounter (Signed)
Left message for pt to call and schedule appointment °

## 2012-09-29 ENCOUNTER — Encounter (HOSPITAL_COMMUNITY): Payer: Self-pay | Admitting: Emergency Medicine

## 2012-09-29 ENCOUNTER — Other Ambulatory Visit: Payer: Self-pay | Admitting: *Deleted

## 2012-09-29 ENCOUNTER — Inpatient Hospital Stay: Payer: Medicare Other | Admitting: Internal Medicine

## 2012-09-29 ENCOUNTER — Telehealth: Payer: Self-pay | Admitting: *Deleted

## 2012-09-29 ENCOUNTER — Inpatient Hospital Stay (HOSPITAL_COMMUNITY)
Admission: EM | Admit: 2012-09-29 | Discharge: 2012-10-04 | DRG: 871 | Disposition: A | Discharge status: Home / Self Care | Payer: Medicare HMO | Attending: Internal Medicine | Admitting: Internal Medicine

## 2012-09-29 ENCOUNTER — Telehealth: Payer: Self-pay | Admitting: Hematology & Oncology

## 2012-09-29 DIAGNOSIS — E46 Unspecified protein-calorie malnutrition: Secondary | ICD-10-CM | POA: Diagnosis present

## 2012-09-29 DIAGNOSIS — J811 Chronic pulmonary edema: Secondary | ICD-10-CM

## 2012-09-29 DIAGNOSIS — M129 Arthropathy, unspecified: Secondary | ICD-10-CM | POA: Diagnosis present

## 2012-09-29 DIAGNOSIS — J0410 Acute tracheitis without obstruction: Secondary | ICD-10-CM

## 2012-09-29 DIAGNOSIS — I252 Old myocardial infarction: Secondary | ICD-10-CM

## 2012-09-29 DIAGNOSIS — K219 Gastro-esophageal reflux disease without esophagitis: Secondary | ICD-10-CM

## 2012-09-29 DIAGNOSIS — F17200 Nicotine dependence, unspecified, uncomplicated: Secondary | ICD-10-CM | POA: Diagnosis present

## 2012-09-29 DIAGNOSIS — E1169 Type 2 diabetes mellitus with other specified complication: Secondary | ICD-10-CM

## 2012-09-29 DIAGNOSIS — I2510 Atherosclerotic heart disease of native coronary artery without angina pectoris: Secondary | ICD-10-CM | POA: Diagnosis present

## 2012-09-29 DIAGNOSIS — I959 Hypotension, unspecified: Secondary | ICD-10-CM

## 2012-09-29 DIAGNOSIS — J9600 Acute respiratory failure, unspecified whether with hypoxia or hypercapnia: Secondary | ICD-10-CM

## 2012-09-29 DIAGNOSIS — I10 Essential (primary) hypertension: Secondary | ICD-10-CM

## 2012-09-29 DIAGNOSIS — J4489 Other specified chronic obstructive pulmonary disease: Secondary | ICD-10-CM | POA: Diagnosis present

## 2012-09-29 DIAGNOSIS — J189 Pneumonia, unspecified organism: Secondary | ICD-10-CM

## 2012-09-29 DIAGNOSIS — R6520 Severe sepsis without septic shock: Secondary | ICD-10-CM | POA: Diagnosis present

## 2012-09-29 DIAGNOSIS — Z79899 Other long term (current) drug therapy: Secondary | ICD-10-CM

## 2012-09-29 DIAGNOSIS — Z720 Tobacco use: Secondary | ICD-10-CM

## 2012-09-29 DIAGNOSIS — A0471 Enterocolitis due to Clostridium difficile, recurrent: Secondary | ICD-10-CM

## 2012-09-29 DIAGNOSIS — E871 Hypo-osmolality and hyponatremia: Secondary | ICD-10-CM | POA: Diagnosis present

## 2012-09-29 DIAGNOSIS — K567 Ileus, unspecified: Secondary | ICD-10-CM

## 2012-09-29 DIAGNOSIS — J9601 Acute respiratory failure with hypoxia: Secondary | ICD-10-CM

## 2012-09-29 DIAGNOSIS — D649 Anemia, unspecified: Secondary | ICD-10-CM

## 2012-09-29 DIAGNOSIS — Y95 Nosocomial condition: Secondary | ICD-10-CM

## 2012-09-29 DIAGNOSIS — E669 Obesity, unspecified: Secondary | ICD-10-CM

## 2012-09-29 DIAGNOSIS — E119 Type 2 diabetes mellitus without complications: Secondary | ICD-10-CM | POA: Diagnosis present

## 2012-09-29 DIAGNOSIS — G8929 Other chronic pain: Secondary | ICD-10-CM

## 2012-09-29 DIAGNOSIS — Z7982 Long term (current) use of aspirin: Secondary | ICD-10-CM

## 2012-09-29 DIAGNOSIS — J9620 Acute and chronic respiratory failure, unspecified whether with hypoxia or hypercapnia: Secondary | ICD-10-CM

## 2012-09-29 DIAGNOSIS — R6521 Severe sepsis with septic shock: Secondary | ICD-10-CM | POA: Diagnosis present

## 2012-09-29 DIAGNOSIS — A419 Sepsis, unspecified organism: Principal | ICD-10-CM

## 2012-09-29 DIAGNOSIS — E861 Hypovolemia: Secondary | ICD-10-CM

## 2012-09-29 DIAGNOSIS — J449 Chronic obstructive pulmonary disease, unspecified: Secondary | ICD-10-CM

## 2012-09-29 DIAGNOSIS — E785 Hyperlipidemia, unspecified: Secondary | ICD-10-CM

## 2012-09-29 DIAGNOSIS — E875 Hyperkalemia: Secondary | ICD-10-CM | POA: Diagnosis present

## 2012-09-29 DIAGNOSIS — R001 Bradycardia, unspecified: Secondary | ICD-10-CM

## 2012-09-29 DIAGNOSIS — G9341 Metabolic encephalopathy: Secondary | ICD-10-CM | POA: Diagnosis not present

## 2012-09-29 DIAGNOSIS — J209 Acute bronchitis, unspecified: Secondary | ICD-10-CM

## 2012-09-29 DIAGNOSIS — Z9981 Dependence on supplemental oxygen: Secondary | ICD-10-CM

## 2012-09-29 DIAGNOSIS — A0472 Enterocolitis due to Clostridium difficile, not specified as recurrent: Secondary | ICD-10-CM

## 2012-09-29 DIAGNOSIS — R000 Tachycardia, unspecified: Secondary | ICD-10-CM

## 2012-09-29 DIAGNOSIS — R079 Chest pain, unspecified: Secondary | ICD-10-CM

## 2012-09-29 DIAGNOSIS — J441 Chronic obstructive pulmonary disease with (acute) exacerbation: Secondary | ICD-10-CM

## 2012-09-29 DIAGNOSIS — D72829 Elevated white blood cell count, unspecified: Secondary | ICD-10-CM | POA: Diagnosis present

## 2012-09-29 DIAGNOSIS — E1165 Type 2 diabetes mellitus with hyperglycemia: Secondary | ICD-10-CM

## 2012-09-29 DIAGNOSIS — Z794 Long term (current) use of insulin: Secondary | ICD-10-CM

## 2012-09-29 DIAGNOSIS — R0689 Other abnormalities of breathing: Secondary | ICD-10-CM

## 2012-09-29 DIAGNOSIS — J111 Influenza due to unidentified influenza virus with other respiratory manifestations: Secondary | ICD-10-CM

## 2012-09-29 DIAGNOSIS — N179 Acute kidney failure, unspecified: Secondary | ICD-10-CM

## 2012-09-29 HISTORY — DX: Enterocolitis due to Clostridium difficile, not specified as recurrent: A04.72

## 2012-09-29 LAB — COMPREHENSIVE METABOLIC PANEL
ALT: 7 U/L (ref 0–35)
Calcium: 7.9 mg/dL — ABNORMAL LOW (ref 8.4–10.5)
Creatinine, Ser: 2.62 mg/dL — ABNORMAL HIGH (ref 0.50–1.10)
GFR calc Af Amer: 22 mL/min — ABNORMAL LOW (ref >90)
Glucose, Bld: 163 mg/dL — ABNORMAL HIGH (ref 70–99)
Sodium: 123 mEq/L — ABNORMAL LOW (ref 135–145)
Total Protein: 4.7 g/dL — ABNORMAL LOW (ref 6.0–8.3)

## 2012-09-29 LAB — MRSA PCR SCREENING: MRSA by PCR: NEGATIVE

## 2012-09-29 LAB — URINALYSIS, ROUTINE W REFLEX MICROSCOPIC
Glucose, UA: NEGATIVE mg/dL
Nitrite: NEGATIVE
Protein, ur: NEGATIVE mg/dL
Urobilinogen, UA: 1 mg/dL (ref 0.0–1.0)

## 2012-09-29 LAB — PROCALCITONIN: Procalcitonin: 2.91 ng/mL

## 2012-09-29 LAB — CBC WITH DIFFERENTIAL/PLATELET
Basophils Relative: 0 % (ref 0–1)
Eosinophils Absolute: 0 10*3/uL (ref 0.0–0.7)
Hemoglobin: 11.4 g/dL — ABNORMAL LOW (ref 12.0–15.0)
Lymphs Abs: 1.8 10*3/uL (ref 0.7–4.0)
MCH: 27.2 pg (ref 26.0–34.0)
MCHC: 32.7 g/dL (ref 30.0–36.0)
Monocytes Absolute: 2.9 10*3/uL — ABNORMAL HIGH (ref 0.1–1.0)
Neutrophils Relative %: 87 % — ABNORMAL HIGH (ref 43–77)
Platelets: 395 10*3/uL (ref 150–400)

## 2012-09-29 LAB — BASIC METABOLIC PANEL
BUN: 45 mg/dL — ABNORMAL HIGH (ref 6–23)
Chloride: 91 mEq/L — ABNORMAL LOW (ref 96–112)
GFR calc Af Amer: 25 mL/min — ABNORMAL LOW (ref >90)
Glucose, Bld: 128 mg/dL — ABNORMAL HIGH (ref 70–99)
Potassium: 5.1 mEq/L (ref 3.5–5.1)

## 2012-09-29 LAB — POCT I-STAT 3, ART BLOOD GAS (G3+)
Bicarbonate: 27.1 mEq/L — ABNORMAL HIGH (ref 20.0–24.0)
pCO2 arterial: 51.4 mmHg — ABNORMAL HIGH (ref 35.0–45.0)
pH, Arterial: 7.33 — ABNORMAL LOW (ref 7.350–7.450)
pO2, Arterial: 94 mmHg (ref 80.0–100.0)

## 2012-09-29 LAB — GLUCOSE, CAPILLARY: Glucose-Capillary: 122 mg/dL — ABNORMAL HIGH (ref 70–99)

## 2012-09-29 LAB — CBC
HCT: 38 % (ref 36.0–46.0)
Hemoglobin: 12 g/dL (ref 12.0–15.0)
WBC: 33.3 10*3/uL — ABNORMAL HIGH (ref 4.0–10.5)

## 2012-09-29 LAB — CG4 I-STAT (LACTIC ACID)
Lactic Acid, Venous: 3.12 mmol/L — ABNORMAL HIGH (ref 0.5–2.2)
Lactic Acid, Venous: 1.22 mmol/L (ref 0.5–2.2)

## 2012-09-29 LAB — URINE MICROSCOPIC-ADD ON

## 2012-09-29 MED ORDER — VANCOMYCIN 50 MG/ML ORAL SOLUTION
500.0000 mg | Freq: Four times a day (QID) | ORAL | Status: DC
  Administered 2012-09-29 – 2012-10-04 (×19): 500 mg via ORAL
  Filled 2012-09-29 (×24): qty 10

## 2012-09-29 MED ORDER — SODIUM CHLORIDE 0.9 % IV SOLN: 250.0000 mL | INTRAVENOUS | Status: DC | PRN

## 2012-09-29 MED ORDER — INSULIN ASPART 100 UNIT/ML ~~LOC~~ SOLN: 0.0000 [IU] | SUBCUTANEOUS | Status: DC

## 2012-09-29 MED ORDER — HEPARIN SODIUM (PORCINE) 5000 UNIT/ML IJ SOLN
5000.0000 [IU] | Freq: Three times a day (TID) | INTRAMUSCULAR | Status: DC
  Administered 2012-09-29 – 2012-10-04 (×15): 5000 [IU] via SUBCUTANEOUS
  Filled 2012-09-29 (×19): qty 1

## 2012-09-29 MED ORDER — SODIUM CHLORIDE 0.9 % IV BOLUS (SEPSIS)
1000.0000 mL | Freq: Once | INTRAVENOUS | Status: AC
  Administered 2012-09-29: 1000 mL via INTRAVENOUS

## 2012-09-29 MED ORDER — VANCOMYCIN 50 MG/ML ORAL SOLUTION
500.0000 mg | ORAL | Status: AC
  Administered 2012-09-29: 500 mg via ORAL
  Filled 2012-09-29: qty 10

## 2012-09-29 MED ORDER — METRONIDAZOLE IN NACL 5-0.79 MG/ML-% IV SOLN
500.0000 mg | INTRAVENOUS | Status: AC
  Administered 2012-09-29: 500 mg via INTRAVENOUS
  Filled 2012-09-29: qty 100

## 2012-09-29 MED ORDER — SODIUM CHLORIDE 0.9 % IV SOLN
INTRAVENOUS | Status: DC
  Administered 2012-09-29: 18:00:00 via INTRAVENOUS

## 2012-09-29 MED ORDER — METRONIDAZOLE IN NACL 5-0.79 MG/ML-% IV SOLN
500.0000 mg | Freq: Three times a day (TID) | INTRAVENOUS | Status: DC
  Administered 2012-09-30 (×2): 500 mg via INTRAVENOUS
  Filled 2012-09-29 (×4): qty 100

## 2012-09-29 MED ORDER — PHENYLEPHRINE HCL 10 MG/ML IJ SOLN
30.0000 ug/min | Freq: Once | INTRAVENOUS | Status: DC
  Filled 2012-09-29: qty 1

## 2012-09-29 MED ORDER — SODIUM CHLORIDE 0.9 % IV SOLN: INTRAVENOUS | Status: DC

## 2012-09-29 MED ORDER — NOREPINEPHRINE BITARTRATE 1 MG/ML IJ SOLN
2.0000 ug/min | Freq: Once | INTRAVENOUS | Status: DC
  Filled 2012-09-29: qty 4

## 2012-09-29 NOTE — Consult Note (Signed)
PULMONARY  / CRITICAL CARE MEDICINE  Name: [name] MRN: [id number] DOB: Aug 29, 1955    ADMISSION DATE:  09/29/2012 CONSULTATION DATE:  09/29/12   REFERRING MD :  Bernette Mayers PRIMARY SERVICE:  [name] NP is primary  CHIEF COMPLAINT: diarrhea/ lethargy   BRIEF PATIENT DESCRIPTION:  66 yowf discharged 7/15 by [facility] for [facility] but never filled vanc due to ? copay issues and continued to have diarrhea then 3 days pta severe increase in volume and frequency then lethargy am 7/28 > to er with shock/ acute renal failure and PCCM service asked to eval for icu admit  SIGNIFICANT EVENTS / STUDIES:    LINES / TUBES:   CULTURES:  BC x 2 09/29/12   ANTIBIOTICS: Flagyl 7/28 >> Vancomcyin 7/28 >>  HISTORY OF PRESENT ILLNESS:   [name] is a 57 y.o. female who was hospitalized last December with influenza and probable secondary pneumococcal pneumonia. For the tail end of her admission she developed C. difficile colitis. It appears she was treated with oral metronidazole. She reports persistent diarrhea and her pharmacist confirms that she received for refills on her Flagyl on March 6, March 11, April 2 and May 2. She also received a prescription for ciprofloxacin on March 6, March 29 and April 21. She was admitted again on July 8 with recurrent diarrhea and found to be C. difficile positive again. She reports diarrhea for the past month and has been taking Lomotil. She was hypotensive, tachypneic and tachycardic on admission 7/8  and was intubated. Improved with fluids and d/c on 7/15 with above interim hx  Pt feeling some better p 2 liters so far s tachycardia, and tachypnea. Denies nausea or abd pain or rigors/cp or cough    PAST MEDICAL HISTORY :  Past Medical History  Diagnosis Date  . COPD (chronic obstructive pulmonary disease)   . Respiratory failure   . Morbid obesity   . Tobacco abuse   . GERD (gastroesophageal reflux disease)   . Hyperlipidemia   . Chronic pain   .  Hypertension   . Heart attack   . Arthritis   . Allergic rhinitis   . Diabetes mellitus, type 2   . Diabetes mellitus type 2 in obese 02/29/2012  . HTN (hypertension) 02/29/2012   Past Surgical History  Procedure Laterality Date  . Cholecystectomy    . Partial hysterectomy    . Angioplasty    . Umbilical hernia repair  12/2009   Prior to Admission medications   Medication Sig Start Date End Date Taking? Authorizing Provider  acetaminophen (TYLENOL) 325 MG tablet Take 650 mg by mouth every 6 (six) hours as needed for pain.   Yes Historical Provider, MD  antiseptic oral rinse (BIOTENE) LIQD 15 mLs by Mouth Rinse route QID. 03/12/12  Yes Annett Gula, MD  aspirin 81 MG chewable tablet Chew 1 tablet (81 mg total) by mouth daily. 03/12/12  Yes Annett Gula, MD  budesonide (PULMICORT) 0.25 MG/2ML nebulizer solution Take 2 mLs (0.25 mg total) by nebulization 2 (two) times daily. 09/16/12  Yes Vassie Loll, MD  diclofenac sodium (VOLTAREN) 1 % GEL Apply 4 g topically 2 (two) times daily as needed (for foot pain).    Yes Historical Provider, MD  famotidine (PEPCID) 20 MG tablet Take 1 tablet (20 mg total) by mouth at bedtime. 09/16/12  Yes Vassie Loll, MD  furosemide (LASIX) 40 MG tablet Take 1 tablet (40 mg total) by mouth 2 (two) times daily. 09/16/12  Yes Vassie Loll, MD  glimepiride (AMARYL) 2 MG tablet Take 2 mg by mouth 2 (two) times daily with a meal.   Yes Historical Provider, MD  HYDROcodone-acetaminophen (NORCO) 7.5-325 MG per tablet Take 1 tablet by mouth every 4 (four) hours as needed for pain.   Yes Historical Provider, MD  insulin aspart (NOVOLOG) 100 UNIT/ML injection Inject 0-8 Units into the skin 3 (three) times daily with meals. On sliding scale:  0-100 0 units 101-150 take 3 units 151-200 take 5 units 201>300 take 8 units 400 or higher call MD   Yes Historical Provider, MD  insulin glargine (LANTUS) 100 UNIT/ML injection Inject 32 Units into the skin at bedtime. 03/12/12   Yes Annett Gula, MD  ipratropium-albuterol (DUONEB) 0.5-2.5 (3) MG/3ML SOLN Take 3 mLs by nebulization 3 (three) times daily.   Yes Historical Provider, MD  isosorbide mononitrate (IMDUR) 15 mg TB24 Take 0.5 tablets (15 mg total) by mouth daily. 09/17/12  Yes Vassie Loll, MD  levofloxacin (LEVAQUIN) 500 MG tablet Take 1 tablet (500 mg total) by mouth daily. 09/16/12  Yes Vassie Loll, MD  rOPINIRole (REQUIP) 1 MG tablet Take 1 mg by mouth at bedtime.   Yes Historical Provider, MD  sertraline (ZOLOFT) 50 MG tablet Take 50 mg by mouth daily.   Yes Historical Provider, MD  vancomycin (VANCOCIN) 50 mg/mL oral solution Take 2.5 mLs (125 mg total) by mouth every 6 (six) hours. 09/16/12  Yes Vassie Loll, MD  zolpidem (AMBIEN) 5 MG tablet Take 5 mg by mouth at bedtime as needed for sleep.   Yes Historical Provider, MD  gabapentin (NEURONTIN) 100 MG capsule Take 1 capsule (100 mg total) by mouth 3 (three) times daily. 04/21/12   Julio Sicks, NP   Allergies  Allergen Reactions  . Cardizem (Diltiazem Hcl) Swelling  . Metformin And Related Swelling    FAMILY HISTORY:  No family history on file. SOCIAL HISTORY:  reports that she quit smoking about 6 months ago. Her smoking use included Cigarettes. She has a 40 pack-year smoking history. She has never used smokeless tobacco. She reports that she does not drink alcohol. Her drug history is not on file.  REVIEW OF SYSTEMS:  Taken in detail and all systems neg per fm x  As outlined in HPI  SUBJECTIVE:  C/o diarrhea, miserable but no focal complaints  VITAL SIGNS: Temp:  [97.9 F (36.6 C)-98.2 F (36.8 C)] 98.2 F (36.8 C) (07/28 1325) Pulse Rate:  [83-95] 83 (07/28 1630) Resp:  [18-25] 18 (07/28 1630) BP: (68-94)/(42-54) 82/42 mmHg (07/28 1630) SpO2:  [94 %-97 %] 96 % (07/28 1630) FIO2:  3lpm NP  HEMODYNAMICS:   VENTILATOR SETTINGS:   INTAKE / OUTPUT: Intake/Output   None     PHYSICAL EXAMINATION: General:obese wf with variably  accurate responses to questions, uncomfortable but not toxic Neck supple, orophax dry mucosa No jvd, nodes Lungs clear bilaterally RRR  P 95 with RBBB pattern in SR abd mod distended, min diffuse tenderness s rebound Ext with chronic venous stasis changes and no edema Neuro no focal def   LABS:  Recent Labs Lab 09/29/12 1400 09/29/12 1403 09/29/12 1622  HGB  --   --  11.4*  WBC  --   --  36.3*  PLT  --   --  395  NA  --   --  123*  K  --   --  5.3*  CL  --   --  87*  CO2  --   --  24  GLUCOSE  --   --  163*  BUN  --   --  46*  CREATININE  --   --  2.62*  CALCIUM  --   --  7.9*  AST  --   --  12  ALT  --   --  7  ALKPHOS  --   --  67  BILITOT  --   --  0.2*  PROT  --   --  4.7*  ALBUMIN  --   --  1.7*  LATICACIDVEN  --  3.12*  --   PHART 7.330*  --   --   PCO2ART 51.4*  --   --   PO2ART 94.0  --   --    No results found for this basename: GLUCAP,  in the last 168 hours     ASSESSMENT / PLAN:  PULMONARY A: acute resp failure/ 02 dep - obesity/ basilar atx/ ? Early ali from gi sepsis  CARDIOVASCULAR A: Shock secondary to profound hypvolemia, r/o sepsi - Check pct  RENAL Lab Results  Component Value Date   CREATININE 2.62* 09/29/2012   CREATININE 0.66 09/14/2012   CREATININE 0.86 09/12/2012    A: on lasix as outpt with profound GI losses so probably very dehydrated (4-6 liters likely needed just to catch up - P continue aggressive vol exp   GASTROINTESTINAL A:  Refractory C diff colitis P:   See ID  HEMATOLOGIC A:  Leukocytosis secondary to low grade GI sepsi*  INFECTIOUS A: refractory PMC Rec I V flagyl and po vanc pending reassess by ID   ENDOCRINE A:  Aodm.,  R/o adrenal insuff P.  Check random cortisol  NEUROLOGIC A: Acute delirium with variable alertness/ attentiveness and orientation - likely combination of dehydration/ uremia/ sepsis P Rx underlying disorders as above     I have personally obtained a history, examined the patient,  evaluated laboratory and imaging results, formulated the assessment and plan and placed orders. CRITICAL CARE: The patient is critically ill with multiple organ systems failure and requires high complexity decision making for assessment and support, frequent evaluation and titration of therapies, application of advanced monitoring technologies and extensive interpretation of multiple databases. Critical Care Time devoted to patient care services described in this note is 45 min   If she responds to up to 6 liters fluid in ER then would admit to [facility] for step down/ floor.  If not, we can admit her to [facility] service   Pulmonary and Critical Care Medicine [facility] Pager: [phone]  09/29/2012, 7:08 PM

## 2012-09-29 NOTE — Telephone Encounter (Signed)
Pt's daughter called, asking if the patient can be seen in the clinic before 3:45 today.  Per the daughter, patient is drowsy and still having diarrhea multiple times a day.  Pt apparently never filled her medications upon discharge from [facility] on 09/16/12 (pt was told there would be a copay, never filled the medication).  The daughter is concerned that the patient is not stable.  RN advised that the patient go to the ED, as she sounds dehydrated and in need of fluids.   RN spoke with Park Pope, PharmD, at [facility].  Patient can bring her vancomycin rx to him for 340B pricing.  Daughter verbalized agreement.  Dr. Drue Second in agreement with this plan.  Will cc Dr. Orvan Falconer at the patient's request, as he saw the patient in the hospital. Andree Coss, RN

## 2012-09-29 NOTE — ED Provider Notes (Signed)
Care assumed at the change of shift. BP has remained low despite 2000cc bolus and currently getting 3rd liter. She also has acute renal failure and marked leukocytosis consistent with c-diff and septic shock. Discussed with Pharmacy, will start Flagyl/Vanc. Spoke with Dr. Vassie Loll with PCCM, recommends Neosynephrine for BP support. He will come evaluate the patient.   Charles B. Bernette Mayers, MD 09/29/12 1755

## 2012-09-29 NOTE — ED Notes (Signed)
Results of lactic acid called to Dr. Anitra Lauth

## 2012-09-29 NOTE — ED Notes (Signed)
Per family pt was d/c from hospital 2 weeks ago and she was to get meds for that but she has not gotten it yet. Now -pt is weak and has slept a lot. Pt is aao.  But feels  dehydrated

## 2012-09-29 NOTE — Progress Notes (Signed)
ANTIBIOTIC CONSULT NOTE - INITIAL  Pharmacy Consult for vanc and flagyl Indication: recurrent C.diff  Allergies  Allergen Reactions  . Cardizem (Diltiazem Hcl) Swelling  . Metformin And Related Swelling    Vital Signs: Temp: 98.2 F (36.8 C) (07/28 1325) Temp src: Oral (07/28 1325) BP: 82/42 mmHg (07/28 1630) Pulse Rate: 83 (07/28 1630) Intake/Output from previous day:   Intake/Output from this shift:    Labs:  Recent Labs  09/29/12 1622  WBC 36.3*  HGB 11.4*  PLT 395   The CrCl is unknown because both a height and weight (above a minimum accepted value) are required for this calculation. No results found for this basename: VANCOTROUGH, Leodis Binet, VANCORANDOM, GENTTROUGH, GENTPEAK, GENTRANDOM, TOBRATROUGH, TOBRAPEAK, TOBRARND, AMIKACINPEAK, AMIKACINTROU, AMIKACIN,  in the last 72 hours   Microbiology: Recent Results (from the past 720 hour(s))  CLOSTRIDIUM DIFFICILE BY PCR     Status: Abnormal   Collection Time    09/09/12  4:15 PM      Result Value Range Status   C difficile by pcr POSITIVE (*) NEGATIVE Final   Comment: CRITICAL RESULT CALLED TO, READ BACK BY AND VERIFIED WITH:     L.YOUNG RN [id number] 09/10/12 E.GADDY  CULTURE, BLOOD (ROUTINE X 2)     Status: None   Collection Time    09/09/12  6:49 PM      Result Value Range Status   Specimen Description BLOOD HAND LEFT   Final   Special Requests BOTTLES DRAWN AEROBIC ONLY 3CC   Final   Culture  Setup Time 09/10/2012 01:16   Final   Culture NO GROWTH 5 DAYS   Final   Report Status 09/16/2012 FINAL   Final  CULTURE, BLOOD (ROUTINE X 2)     Status: None   Collection Time    09/09/12  7:10 PM      Result Value Range Status   Specimen Description BLOOD HAND RIGHT   Final   Special Requests BOTTLES DRAWN AEROBIC ONLY 3CC   Final   Culture  Setup Time 09/10/2012 01:16   Final   Culture NO GROWTH 5 DAYS   Final   Report Status 09/16/2012 FINAL   Final  MRSA PCR SCREENING     Status: None   Collection Time   09/09/12 11:11 PM      Result Value Range Status   MRSA by PCR NEGATIVE  NEGATIVE Final   Comment:            The GeneXpert MRSA Assay (FDA     approved for NASAL specimens     only), is one component of a     comprehensive MRSA colonization     surveillance program. It is not     intended to diagnose MRSA     infection nor to guide or     monitor treatment for     MRSA infections.  URINE CULTURE     Status: None   Collection Time    09/10/12  9:19 AM      Result Value Range Status   Specimen Description URINE, CATHETERIZED   Final   Special Requests NONE   Final   Culture  Setup Time 09/10/2012 10:15   Final   Colony Count NO GROWTH   Final   Culture NO GROWTH   Final   Report Status 09/11/2012 FINAL   Final  URINE CULTURE     Status: None   Collection Time    09/10/12  4:27 PM  Result Value Range Status   Specimen Description URINE, CATHETERIZED   Final   Special Requests NONE   Final   Culture  Setup Time 09/10/2012 23:24   Final   Colony Count NO GROWTH   Final   Culture NO GROWTH   Final   Report Status 09/11/2012 FINAL   Final  CULTURE, BLOOD (ROUTINE X 2)     Status: None   Collection Time    09/14/12  7:35 AM      Result Value Range Status   Specimen Description BLOOD LEFT HAND   Final   Special Requests BOTTLES DRAWN AEROBIC ONLY 1CC   Final   Culture  Setup Time 09/14/2012 15:16   Final   Culture NO GROWTH 5 DAYS   Final   Report Status 09/20/2012 FINAL   Final  CULTURE, BLOOD (ROUTINE X 2)     Status: None   Collection Time    09/14/12  7:40 AM      Result Value Range Status   Specimen Description BLOOD LEFT HAND   Final   Special Requests BOTTLES DRAWN AEROBIC ONLY 3CC   Final   Culture  Setup Time 09/14/2012 15:14   Final   Culture NO GROWTH 5 DAYS   Final   Report Status 09/20/2012 FINAL   Final    Medical History: Past Medical History  Diagnosis Date  . COPD (chronic obstructive pulmonary disease)   . Respiratory failure   . Morbid obesity    . Tobacco abuse   . GERD (gastroesophageal reflux disease)   . Hyperlipidemia   . Chronic pain   . Hypertension   . Heart attack   . Arthritis   . Allergic rhinitis   . Diabetes mellitus, type 2   . Diabetes mellitus type 2 in obese 02/29/2012  . HTN (hypertension) 02/29/2012    Assessment: Patient is a 57 y.o F with recurrent C. Diff who was recently discharged from [facility] on 09/16/12 with prescription for PO vancomycin (125mg  q6h) but this was never filled.  Patient's daughter called the ID clinic today to report that her mom was drowsy with persistent diarrhea and was advised by the clinic to come to [facility] ED.  Patient's now hypotensive with wbc >15 and scr>1.5.    Plan:  1) vancomycin 500mg  PO Q6h and flagyl 500mg  IV q8h 2) Consider consulting ID for further recommendations  [name] P 09/29/2012,5:30 PM

## 2012-09-29 NOTE — Telephone Encounter (Signed)
Left pt message to call and schedule appointment °

## 2012-09-29 NOTE — ED Provider Notes (Signed)
CSN: [id number]     Arrival date & time 09/29/12  1251 History     First MD Initiated Contact with Patient 09/29/12 1308     Chief Complaint  Patient presents with  . Weakness   (Consider location/radiation/quality/duration/timing/severity/associated sxs/prior Treatment) Patient is a 57 y.o. female presenting with weakness. The history is provided by the patient and a relative.  Weakness This is a recurrent problem. The current episode started 2 days ago. The problem occurs constantly. The problem has been gradually worsening. Pertinent negatives include no abdominal pain and no shortness of breath. Associated symptoms comments: Pt with hx of severe c.diff off and on for 1 year and then had most recent episode at the beginning of July.  Pt was unable to get meds filled and now diarrhea has returned.  No N/V but very sleepy and generalized weakness.  Severe diarrhea that is watery and brown/green.. Nothing aggravates the symptoms. Nothing relieves the symptoms. She has tried nothing for the symptoms. The treatment provided no relief.    Past Medical History  Diagnosis Date  . COPD (chronic obstructive pulmonary disease)   . Respiratory failure   . Morbid obesity   . Tobacco abuse   . GERD (gastroesophageal reflux disease)   . Hyperlipidemia   . Chronic pain   . Hypertension   . Heart attack   . Arthritis   . Allergic rhinitis   . Diabetes mellitus, type 2   . Diabetes mellitus type 2 in obese 02/29/2012  . HTN (hypertension) 02/29/2012   Past Surgical History  Procedure Laterality Date  . Cholecystectomy    . Partial hysterectomy    . Angioplasty    . Umbilical hernia repair  12/2009   No family history on file. History  Substance Use Topics  . Smoking status: Former Smoker -- 1.00 packs/day for 40 years    Types: Cigarettes    Quit date: 03/04/2012  . Smokeless tobacco: Never Used  . Alcohol Use: No   OB History   Grav Para Term Preterm Abortions TAB SAB Ect Mult  Living                 Review of Systems  Constitutional: Positive for appetite change and fatigue. Negative for fever.  Respiratory: Negative for cough, shortness of breath and wheezing.   Gastrointestinal: Positive for diarrhea. Negative for abdominal pain.  Neurological: Positive for weakness.  All other systems reviewed and are negative.    Allergies  Cardizem and Metformin and related  Home Medications   Current Outpatient Rx  Name  Route  Sig  Dispense  Refill  . acetaminophen (TYLENOL) 325 MG tablet   Oral   Take 650 mg by mouth every 6 (six) hours as needed for pain.         Marland Kitchen antiseptic oral rinse (BIOTENE) LIQD   Mouth Rinse   15 mLs by Mouth Rinse route QID.         Marland Kitchen aspirin 81 MG chewable tablet   Oral   Chew 1 tablet (81 mg total) by mouth daily.         . budesonide (PULMICORT) 0.25 MG/2ML nebulizer solution   Nebulization   Take 2 mLs (0.25 mg total) by nebulization 2 (two) times daily.   60 mL   1   . diclofenac sodium (VOLTAREN) 1 % GEL   Apply externally   Apply 4 g topically 2 (two) times daily as needed (for foot pain).          Marland Kitchen  famotidine (PEPCID) 20 MG tablet   Oral   Take 1 tablet (20 mg total) by mouth at bedtime.   30 tablet   1   . furosemide (LASIX) 40 MG tablet   Oral   Take 1 tablet (40 mg total) by mouth 2 (two) times daily.   60 tablet   1   . glimepiride (AMARYL) 2 MG tablet   Oral   Take 2 mg by mouth 2 (two) times daily with a meal.         . HYDROcodone-acetaminophen (NORCO) 7.5-325 MG per tablet   Oral   Take 1 tablet by mouth every 4 (four) hours as needed for pain.         Marland Kitchen insulin aspart (NOVOLOG) 100 UNIT/ML injection   Subcutaneous   Inject 0-8 Units into the skin 3 (three) times daily with meals. On sliding scale:  0-100 0 units 101-150 take 3 units 151-200 take 5 units 201>300 take 8 units 400 or higher call MD         . insulin glargine (LANTUS) 100 UNIT/ML injection   Subcutaneous    Inject 32 Units into the skin at bedtime.         Marland Kitchen ipratropium-albuterol (DUONEB) 0.5-2.5 (3) MG/3ML SOLN   Nebulization   Take 3 mLs by nebulization 3 (three) times daily.         . isosorbide mononitrate (IMDUR) 15 mg TB24   Oral   Take 0.5 tablets (15 mg total) by mouth daily.   30 tablet   1   . levofloxacin (LEVAQUIN) 500 MG tablet   Oral   Take 1 tablet (500 mg total) by mouth daily.   2 tablet   0   . rOPINIRole (REQUIP) 1 MG tablet   Oral   Take 1 mg by mouth at bedtime.         . sertraline (ZOLOFT) 50 MG tablet   Oral   Take 50 mg by mouth daily.         . vancomycin (VANCOCIN) 50 mg/mL oral solution   Oral   Take 2.5 mLs (125 mg total) by mouth every 6 (six) hours.   150 mL   0   . zolpidem (AMBIEN) 5 MG tablet   Oral   Take 5 mg by mouth at bedtime as needed for sleep.         Marland Kitchen gabapentin (NEURONTIN) 100 MG capsule   Oral   Take 1 capsule (100 mg total) by mouth 3 (three) times daily.   90 capsule   0    BP 76/50  Pulse 93  Temp(Src) 98.2 F (36.8 C) (Oral)  Resp 20  SpO2 94% Physical Exam  Nursing note and vitals reviewed. Constitutional: She is oriented to person, place, and time. She appears well-developed and well-nourished. She is sleeping. No distress.  HENT:  Head: Normocephalic and atraumatic.  Mouth/Throat: Oropharynx is clear and moist. Mucous membranes are dry.  Eyes: Conjunctivae and EOM are normal. Pupils are equal, round, and reactive to light.  Neck: Normal range of motion. Neck supple.  Cardiovascular: Normal rate, regular rhythm and intact distal pulses.   No murmur heard. Pulmonary/Chest: Effort normal and breath sounds normal. No respiratory distress. She has no wheezes. She has no rales.  Abdominal: Soft. She exhibits no distension. There is no tenderness. There is no rebound and no guarding.  Musculoskeletal: Normal range of motion. She exhibits no edema and no tenderness.  Neurological: She is oriented  to  person, place, and time.  Skin: Skin is warm and dry. No rash noted. No erythema.  Poor skin turgor  Psychiatric: She has a normal mood and affect. Her behavior is normal.    ED Course   Procedures (including critical care time)  Labs Reviewed  POCT I-STAT 3, BLOOD GAS (G3+) - Abnormal; Notable for the following:    pH, Arterial 7.330 (*)    pCO2 arterial 51.4 (*)    Bicarbonate 27.1 (*)    All other components within normal limits  CG4 I-STAT (LACTIC ACID) - Abnormal; Notable for the following:    Lactic Acid, Venous 3.12 (*)    All other components within normal limits  CBC WITH DIFFERENTIAL  COMPREHENSIVE METABOLIC PANEL    Date: 09/29/2012  Rate: 96  Rhythm: normal sinus rhythm  QRS Axis: normal  Intervals: normal  ST/T Wave abnormalities: nonspecific ST/T changes  Conduction Disutrbances:right bundle branch block  Narrative Interpretation:   Old EKG Reviewed: unchanged   No results found. 1. Severe sepsis with septic shock   2. Acute renal failure   3. C. difficile diarrhea   4. Acute respiratory failure with hypoxia   5. Hypotension   6. Intravascular volume depletion     MDM   Patient with a significant history for severe dehydration, CHF and acute renal area requiring ICU stay and massive hydration returns today after being hospitalized 3 weeks ago with recurrent C. difficile. Daughter states that they attempted to get the medication filled requested by ID and none of the pharmacies will fill it. 2 days ago patient's symptoms recurred with massive diarrhea occurring every 15-20 minutes all day long. And now her mother is becoming more drowsy and sleepy hypotensive initially when she arrived here and 68/49. Denies any shortness of breath or chest pain. She has no abdominal pain on exam. Concern for reoccurrence severe dehydration due to C. difficile and poor oral intake. She is satting normally on her home O2 settings and i-STAT ABG today shows a compensated  respiratory acidosis. Lactic acid is elevated at 3.12.  CRITICAL CARE Performed by: Gwyneth Sprout Total critical care time: 30 Critical care time was exclusive of separately billable procedures and treating other patients. Critical care was necessary to treat or prevent imminent or life-threatening deterioration. Critical care was time spent personally by me on the following activities: development of treatment plan with patient and/or surrogate as well as nursing, discussions with consultants, evaluation of patient's response to treatment, examination of patient, obtaining history from patient or surrogate, ordering and performing treatments and interventions, ordering and review of laboratory studies, ordering and review of radiographic studies, pulse oximetry and re-evaluation of patient's condition.   CBC and CMP are pending. Hydration started with and saline boluses. We'll recheck blood pressure after first bolus.  9:41 PM AFter 1st bolus improvement in BP to the 90's systolic.  Will start 2nd L and continue hydration.  CMP and CBC pending.  Pt's airway remains patent. Pt checked out to Dr. Bernette Mayers at 1600.  Gwyneth Sprout, MD 09/29/12 [phone]

## 2012-09-30 ENCOUNTER — Telehealth: Payer: Self-pay | Admitting: Hematology & Oncology

## 2012-09-30 ENCOUNTER — Encounter (HOSPITAL_COMMUNITY): Payer: Self-pay | Admitting: General Practice

## 2012-09-30 DIAGNOSIS — A419 Sepsis, unspecified organism: Secondary | ICD-10-CM

## 2012-09-30 LAB — BASIC METABOLIC PANEL
CO2: 24 mEq/L (ref 19–32)
Calcium: 7.2 mg/dL — ABNORMAL LOW (ref 8.4–10.5)
Glucose, Bld: 96 mg/dL (ref 70–99)
Sodium: 129 mEq/L — ABNORMAL LOW (ref 135–145)

## 2012-09-30 LAB — GLUCOSE, CAPILLARY
Glucose-Capillary: 75 mg/dL (ref 70–99)
Glucose-Capillary: 82 mg/dL (ref 70–99)
Glucose-Capillary: 97 mg/dL (ref 70–99)

## 2012-09-30 LAB — CBC
Hemoglobin: 12 g/dL (ref 12.0–15.0)
MCH: 27.3 pg (ref 26.0–34.0)
MCV: 85.4 fL (ref 78.0–100.0)
RBC: 4.39 MIL/uL (ref 3.87–5.11)

## 2012-09-30 LAB — CORTISOL: Cortisol, Plasma: 25.6 ug/dL

## 2012-09-30 LAB — PHOSPHORUS: Phosphorus: 5.4 mg/dL — ABNORMAL HIGH (ref 2.3–4.6)

## 2012-09-30 MED ORDER — IPRATROPIUM BROMIDE 0.02 % IN SOLN
0.5000 mg | Freq: Four times a day (QID) | RESPIRATORY_TRACT | Status: DC | PRN
  Administered 2012-10-02 – 2012-10-03 (×2): 0.5 mg via RESPIRATORY_TRACT
  Filled 2012-09-30 (×2): qty 2.5

## 2012-09-30 MED ORDER — SODIUM CHLORIDE 0.9 % IV BOLUS (SEPSIS)
1000.0000 mL | Freq: Once | INTRAVENOUS | Status: AC
  Administered 2012-09-30: 1000 mL via INTRAVENOUS

## 2012-09-30 MED ORDER — INSULIN ASPART 100 UNIT/ML ~~LOC~~ SOLN: 1.0000 [IU] | SUBCUTANEOUS | Status: DC

## 2012-09-30 MED ORDER — BOOST PLUS PO LIQD
237.0000 mL | Freq: Three times a day (TID) | ORAL | Status: DC
  Administered 2012-09-30 – 2012-10-04 (×11): 237 mL via ORAL
  Filled 2012-09-30 (×16): qty 237

## 2012-09-30 MED ORDER — METRONIDAZOLE 500 MG PO TABS
500.0000 mg | ORAL_TABLET | Freq: Three times a day (TID) | ORAL | Status: DC
  Administered 2012-09-30 – 2012-10-04 (×13): 500 mg via ORAL
  Filled 2012-09-30 (×21): qty 1

## 2012-09-30 MED ORDER — ONDANSETRON HCL 4 MG PO TABS
4.0000 mg | ORAL_TABLET | Freq: Three times a day (TID) | ORAL | Status: DC | PRN
  Administered 2012-10-03: 4 mg via ORAL
  Filled 2012-09-30: qty 1

## 2012-09-30 MED ORDER — INSULIN ASPART 100 UNIT/ML ~~LOC~~ SOLN
0.0000 [IU] | Freq: Three times a day (TID) | SUBCUTANEOUS | Status: DC
  Administered 2012-10-01: 2 [IU] via SUBCUTANEOUS
  Administered 2012-10-02: 3 [IU] via SUBCUTANEOUS
  Administered 2012-10-02 (×2): 8 [IU] via SUBCUTANEOUS
  Administered 2012-10-03 (×2): 3 [IU] via SUBCUTANEOUS
  Administered 2012-10-03: 8 [IU] via SUBCUTANEOUS
  Administered 2012-10-04 (×2): 5 [IU] via SUBCUTANEOUS

## 2012-09-30 MED ORDER — ALBUTEROL SULFATE (5 MG/ML) 0.5% IN NEBU
5.0000 mg | INHALATION_SOLUTION | Freq: Four times a day (QID) | RESPIRATORY_TRACT | Status: DC | PRN
  Administered 2012-09-30 – 2012-10-03 (×3): 5 mg via RESPIRATORY_TRACT
  Filled 2012-09-30: qty 1
  Filled 2012-09-30: qty 0.5
  Filled 2012-09-30: qty 1

## 2012-09-30 MED ORDER — ASPIRIN EC 81 MG PO TBEC
81.0000 mg | DELAYED_RELEASE_TABLET | Freq: Every day | ORAL | Status: DC
  Administered 2012-09-30 – 2012-10-04 (×5): 81 mg via ORAL
  Filled 2012-09-30 (×6): qty 1

## 2012-09-30 MED ORDER — ONDANSETRON HCL 4 MG/2ML IJ SOLN
4.0000 mg | Freq: Three times a day (TID) | INTRAMUSCULAR | Status: DC | PRN
  Administered 2012-09-30: 4 mg via INTRAVENOUS
  Filled 2012-09-30: qty 2

## 2012-09-30 MED ORDER — SODIUM CHLORIDE 0.9 % IV BOLUS (SEPSIS)
1000.0000 mL | Freq: Once | INTRAVENOUS | Status: AC
Start: 2012-09-30 — End: 2012-09-30
  Administered 2012-09-30: 1000 mL via INTRAVENOUS

## 2012-09-30 MED ORDER — MORPHINE SULFATE 2 MG/ML IJ SOLN
2.0000 mg | INTRAMUSCULAR | Status: DC | PRN
  Administered 2012-09-30 (×3): 2 mg via INTRAVENOUS
  Administered 2012-10-01 (×3): 4 mg via INTRAVENOUS
  Administered 2012-10-03: 2 mg via INTRAVENOUS
  Filled 2012-09-30 (×2): qty 1
  Filled 2012-09-30: qty 2
  Filled 2012-09-30: qty 1
  Filled 2012-09-30: qty 2
  Filled 2012-09-30: qty 1
  Filled 2012-09-30: qty 2

## 2012-09-30 NOTE — Progress Notes (Addendum)
PULMONARY  / CRITICAL CARE MEDICINE  Name: [name] MRN: [id number] DOB: Mar 19, 1955    ADMISSION DATE:  09/29/2012 CONSULTATION DATE:  09/29/12   REFERRING MD :  Bernette Mayers PRIMARY SERVICE:  [name] NP is primary  CHIEF COMPLAINT: diarrhea/ lethargy   BRIEF PATIENT DESCRIPTION:  68 yowf discharged 7/15 by [facility] but never filled vanc due to ? copay issues and continued to have diarrhea then 3 days pta severe increase in volume and frequency then lethargy am 7/28 > to er with shock/ acute renal failure and PCCM service asked to eval for icu admit  SIGNIFICANT EVENTS / STUDIES:    LINES / TUBES:   CULTURES: BC x 2 09/29/12   ANTIBIOTICS: Flagyl 7/28 >> Vancomcyin 7/28 >>  HISTORY OF PRESENT ILLNESS:   [name] is a 57 y.o. female who was hospitalized last December with influenza and probable secondary pneumococcal pneumonia. For the tail end of her admission she developed C. difficile colitis. It appears she was treated with oral metronidazole. She reports persistent diarrhea and her pharmacist confirms that she received four  refills on her Flagyl on March 6, March 11, April 2 and May 2. She also received a prescription for ciprofloxacin on March 6, March 29 and April 21. She was admitted again on July 8 with recurrent diarrhea and found to be C. difficile positive again. She reports diarrhea for the past month and has been taking Lomotil. She was hypotensive, tachypneic and tachycardic on admission 7/8  and was intubated. Improved with fluids and d/c on 7/15.  SUBJECTIVE:    VITAL SIGNS: Temp:  [97.6 F (36.4 C)-98.6 F (37 C)] 97.9 F (36.6 C) (07/29 0741) Pulse Rate:  [80-95] 82 (07/29 0700) Resp:  [15-25] 15 (07/29 0700) BP: (68-121)/(32-94) 100/32 mmHg (07/29 0700) SpO2:  [94 %-100 %] 98 % (07/29 0700) Weight:  [235 lb 7.2 oz (106.8 kg)] 235 lb 7.2 oz (106.8 kg) (07/28 2100)  HEMODYNAMICS:   VENTILATOR SETTINGS:   INTAKE /  OUTPUT: Intake/Output     07/28 0701 - 07/29 0700 07/29 0701 - 07/30 0700   I.V. (mL/kg) 1275 (11.9)    IV Piggyback 600    Total Intake(mL/kg) 1875 (17.6)    Urine (mL/kg/hr) 575    Stool 400    Total Output 975     Net +900          Stool Occurrence 4 x     PHYSICAL EXAMINATION: General:obese female resting in bed, NAD. Heart: RRR. No m/r/g. Lungs: CTAB. Abdomen: soft, slightly tender in the hypogastrium.  No guarding or rebound. Extremities: trace LE edema Neuro: Awake, alert; no focal deficits.  LABS:  Recent Labs Lab 09/29/12 1400 09/29/12 1403  09/29/12 1622 09/29/12 1908 09/29/12 2007 09/29/12 2236 09/30/12 0342  HGB  --   --   --  11.4*  --   --  12.0 12.0  WBC  --   --   --  36.3*  --   --  33.3* 32.4*  PLT  --   --   --  395  --   --  436* 457*  NA  --   --   --  123*  --   --  127* 129*  K  --   --   < > 5.3*  --   --  5.1 5.2*  CL  --   --   --  87*  --   --  91* 96  CO2  --   --   --  24  --   --  25 24  GLUCOSE  --   --   --  163*  --   --  128* 96  BUN  --   --   --  46*  --   --  45* 42*  CREATININE  --   --   --  2.62*  --   --  2.37* 2.05*  CALCIUM  --   --   --  7.9*  --   --  7.9* 7.2*  MG  --   --   --   --   --   --   --  1.6  PHOS  --   --   --   --   --   --   --  5.4*  AST  --   --   --  12  --   --   --   --   ALT  --   --   --  7  --   --   --   --   ALKPHOS  --   --   --  67  --   --   --   --   BILITOT  --   --   --  0.2*  --   --   --   --   PROT  --   --   --  4.7*  --   --   --   --   ALBUMIN  --   --   --  1.7*  --   --   --   --   LATICACIDVEN  --  3.12*  --   --   --  1.22  --   --   TROPONINI  --   --   --   --   --   --  <0.30  --   PROCALCITON  --   --   --   --  2.91  --   --  1.95  PHART 7.330*  --   --   --   --   --   --   --   PCO2ART 51.4*  --   --   --   --   --   --   --   PO2ART 94.0  --   --   --   --   --   --   --   < > = values in this interval not displayed.  Recent Labs Lab 09/29/12 2115 09/30/12 0015  09/30/12 0343 09/30/12 0725  GLUCAP 122* 116* 92 75   ASSESSMENT / PLAN:  PULMONARY A: Chronic respiratory failure COPD -  On home O2 - Patient currently stable on [place] O2 - Duonebs PRN for SOB - No signs of COPD exacerbation currently  CARDIOVASCULAR A: Shock secondary to profound hypovolemia/sepsis Hx of HTN CAD P: - Daily Aspirin for CAD - Hypotension now improved following aggressive volume administration - Holding antihypertensives in the setting of hypotension  RENAL A: AKI secondary to hypovolemia; Creatinine 2.62 on admission Hyponatremia, Hyperkalemia P: - Hyponatremia secondary to volume depletion. Will continue NS @ 125 mL/hr - Mild Hyperkalemia this am (5.2).  Will continue to monitor closely. No need for intervention at this time.  GASTROINTESTINAL A: C diff colitis P:   - It is unclear whether this is a recurrence/refractory - Will continue PO Vanc and Flagyl -  Patient has been seen by ID in the past. Will need follow up with ID.  HEMATOLOGIC A:  Leukocytosis secondary to GI sepsis (C. Diff) - Daily CBC - Heparin for DVT PPx  INFECTIOUS A: Refractory C. Diff colitis - Abx as above  ENDOCRINE A:  DM-2, ? Adrenal insufficiency P: - Q4 CBG's  - SSI - Carb modified diet - Cortisol 25.6 - No need for steroids  NEUROLOGIC A: Acute delirium secondary to critical illness - Delirium now resolved.  TODAY'S SUMMARY: Patient doing well; Continuing Vanc and Flagyl.  Transfer to Med-surg today.  Everlene Other DO Family Medicine PGY-2   I have interviewed and examined the patient and reviewed the database. I have formulated the assessment and plan as reflected in the note above with amendments made by me. [facility] to assume care as of 7/30 AM and PCCM to sign off at that time. Discussed with Dr Diamantina Monks, MD;  PCCM service; Mobile [phone]

## 2012-09-30 NOTE — Telephone Encounter (Signed)
Was going to call x2 saw pt was in hospital. Annabelle Harman at referring aware and will contact us when pt gets out or if they have a change in status.

## 2012-09-30 NOTE — Progress Notes (Signed)
INITIAL NUTRITION ASSESSMENT  DOCUMENTATION CODES Per approved criteria  -Obesity Unspecified   INTERVENTION: Boost Plus, PO TID.   NUTRITION DIAGNOSIS: Inadequate oral intake related to decreased appetite/diarrhea as evidenced by meal completion < 50%.   Goal: Pt to meet >/= 90% of their estimated nutrition needs   Monitor:  PO intake, supplement acceptance, weight trend, labs   Reason for Assessment: Pt identified as at nutrition risk on the Malnutrition Screen Tool  57 y.o. female  Admitting Dx: Diarrhea and lethargy  ASSESSMENT: Pt with hx of c.diff since 02/2012. Per pt she has lost from 265 lb to 202 lb. Weight hx does not reflect this. Per pt she has been eating pretty well, except for the last week. Pt drinks a protein drink at home but cannot think of the name of the supplement. Pt is not willing to try ensure but is willing to try Boost Plus.   Nutrition Focused Physical Exam:  Subcutaneous Fat:  Orbital Region: WNL Upper Arm Region: WNL Thoracic and Lumbar Region: WNL  Muscle:  Temple Region: WNL Clavicle Bone Region: WNL Clavicle and Acromion Bone Region: WNL Scapular Bone Region: WNL Dorsal Hand: WNL Patellar Region: WNL Anterior Thigh Region: WNL Posterior Calf Region: WNL  Edema: not present   Height: Ht Readings from Last 1 Encounters:  09/29/12 5\' 5"  (1.651 m)    Weight: Wt Readings from Last 1 Encounters:  09/29/12 235 lb 7.2 oz (106.8 kg)    Ideal Body Weight: 56.8 kg   % Ideal Body Weight: 188%  Wt Readings from Last 10 Encounters:  09/29/12 235 lb 7.2 oz (106.8 kg)  09/16/12 235 lb 6.4 oz (106.777 kg)  05/15/12 213 lb 12.8 oz (96.979 kg)  04/21/12 226 lb 12.8 oz (102.876 kg)  03/12/12 241 lb 13.5 oz (109.7 kg)  07/07/10 252 lb 3.2 oz (114.397 kg)    Usual Body Weight: 249 lb 02/2012  % Usual Body Weight: 94%  BMI:  Body mass index is 39.18 kg/(m^2).  Estimated Nutritional Needs: Kcal: 2000-2200 Protein: 100-115 grams   Fluid: > 2 L/day  Skin: no issues noted  Diet Order: Carb Control  EDUCATION NEEDS: -No education needs identified at this time   Intake/Output Summary (Last 24 hours) at 09/30/12 1040 Last data filed at 09/30/12 0800  Gross per 24 hour  Intake   2175 ml  Output   1125 ml  Net   1050 ml    Last BM: rectal tube  400 ml 7/28 plus bm x 4 unmeasured  Labs:   Recent Labs Lab 09/29/12 1622 09/29/12 2236 09/30/12 0342  NA 123* 127* 129*  K 5.3* 5.1 5.2*  CL 87* 91* 96  CO2 24 25 24   BUN 46* 45* 42*  CREATININE 2.62* 2.37* 2.05*  CALCIUM 7.9* 7.9* 7.2*  MG  --   --  1.6  PHOS  --   --  5.4*  GLUCOSE 163* 128* 96    CBG (last 3)   Recent Labs  09/30/12 0015 09/30/12 0343 09/30/12 0725  GLUCAP 116* 92 75   Lab Results  Component Value Date   HGBA1C 6.4* 02/29/2012   Scheduled Meds: . aspirin EC  81 mg Oral Daily  . heparin  5,000 Units Subcutaneous Q8H  . insulin aspart  0-15 Units Subcutaneous TID WC  . metroNIDAZOLE  500 mg Oral Q8H  . vancomycin  500 mg Oral QID    Continuous Infusions: . sodium chloride Stopped (09/29/12 1928)    Past  Medical History  Diagnosis Date  . COPD (chronic obstructive pulmonary disease)   . Respiratory failure   . Morbid obesity   . Tobacco abuse   . GERD (gastroesophageal reflux disease)   . Hyperlipidemia   . Chronic pain   . Hypertension   . Heart attack   . Arthritis   . Allergic rhinitis   . Diabetes mellitus, type 2   . Diabetes mellitus type 2 in obese 02/29/2012  . HTN (hypertension) 02/29/2012  . C. difficile diarrhea 09/2012  . Chronic kidney disease 09/2012    acute renal failure  . Shock 09/2012    Past Surgical History  Procedure Laterality Date  . Cholecystectomy    . Partial hysterectomy    . Angioplasty    . Umbilical hernia repair  12/2009    Kendell Bane RD, LDN, CNSC [phone] Pager [phone] After Hours Pager

## 2012-09-30 NOTE — Progress Notes (Signed)
eLink Physician-Brief Progress Note Patient Name: [name] DOB: [birth year] MRN: [id number]  Date of Service  09/30/2012   HPI/Events of Note   Pt c/o abd pain  eICU Interventions  Morphine prn ordered   Intervention Category Intermediate Interventions: Pain - evaluation and management  Taffany Heiser S. 09/30/2012, 4:56 AM

## 2012-10-01 ENCOUNTER — Inpatient Hospital Stay (HOSPITAL_COMMUNITY): Payer: Medicare HMO

## 2012-10-01 DIAGNOSIS — E119 Type 2 diabetes mellitus without complications: Secondary | ICD-10-CM

## 2012-10-01 DIAGNOSIS — E669 Obesity, unspecified: Secondary | ICD-10-CM

## 2012-10-01 LAB — CBC
MCHC: 30.8 g/dL (ref 30.0–36.0)
RDW: 17.2 % — ABNORMAL HIGH (ref 11.5–15.5)

## 2012-10-01 LAB — BASIC METABOLIC PANEL
Calcium: 7.7 mg/dL — ABNORMAL LOW (ref 8.4–10.5)
GFR calc Af Amer: 50 mL/min — ABNORMAL LOW (ref >90)
GFR calc non Af Amer: 43 mL/min — ABNORMAL LOW (ref >90)
Potassium: 5 mEq/L (ref 3.5–5.1)
Sodium: 131 mEq/L — ABNORMAL LOW (ref 135–145)

## 2012-10-01 LAB — GLUCOSE, CAPILLARY: Glucose-Capillary: 133 mg/dL — ABNORMAL HIGH (ref 70–99)

## 2012-10-01 MED ORDER — SODIUM CHLORIDE 0.9 % IV SOLN
INTRAVENOUS | Status: DC
  Administered 2012-10-01 – 2012-10-02 (×2): via INTRAVENOUS

## 2012-10-01 NOTE — Evaluation (Addendum)
Physical Therapy Evaluation Patient Details Name: [name] MRN: [id number] DOB: 1955/04/20 Today's Date: 10/01/2012 Time: 1700-1717 PT Time Calculation (min): 17 min  PT Assessment / Plan / Recommendation History of Present Illness  Patient is a 57 yo female admitted in December with influenza and pna.  At end of admission, with C-dfif.  Intubated and with trach.  To [facility] - recently discharged home.  Now readmitted with persistent diarrhea and septic shock.   Clinical Impression  Presents to PT sitting on the 3in1 with significant abdominal pain. Unable to perform full assessment due to urgent need to have another loose stool. Patient able to stand without difficulty. Does have some generalized weakness of which we will treat with PT in the acute setting to maximize strength and independence for safe d/c home. Likely will not need HHPT f/u as long as she can get her strength back here.     PT Assessment  Patient needs continued PT services    Follow Up Recommendations  No PT follow up    Does the patient have the potential to tolerate intense rehabilitation      Barriers to Discharge        Equipment Recommendations  None recommended by PT    Recommendations for Other Services     Frequency Min 3X/week    Precautions / Restrictions Precautions Precautions: None Restrictions Weight Bearing Restrictions: No   Pertinent Vitals/Pain Reports significant abdominal pain, RN made aware of request for pain meds      Mobility  Transfers Transfers: Sit to Stand;Stand to Sit Sit to Stand: 6: Modified independent (Device/Increase time);With upper extremity assist;From chair/3-in-1 Stand to Sit: 6: Modified independent (Device/Increase time);With upper extremity assist;To chair/3-in-1 Ambulation/Gait Ambulation/Gait Assistance: Not tested (comment) Ambulation/Gait Assistance Details: pt unable to walk due to persistent diarrhea and urgent need to be on 3in1     Exercises     PT Diagnosis: Generalized weakness  PT Problem List: Decreased strength;Decreased activity tolerance PT Treatment Interventions: Gait training;Therapeutic exercise     PT Goals(Current goals can be found in the care plan section) Acute Rehab PT Goals Patient Stated Goal: To return home, to stop having diarrhea, decrease pain PT Goal Formulation: With patient Time For Goal Achievement: 10/08/12 Potential to Achieve Goals: Good  Visit Information  Last PT Received On: 10/01/12 History of Present Illness: Patient is a 57 yo female admitted in December with influenza and pna.  At end of admission, with C-dfif.  Intubated and with trach.  To [facility] - recently discharged home.  Now readmitted with persistent diarrhea and septic shock.        Prior Functioning  Home Living Family/patient expects to be discharged to:: Private residence Living Arrangements: Spouse/significant other Available Help at Discharge: Family;Available 24 hours/day Type of Home: House Home Access: Stairs to enter [employer] of Steps: 1 Entrance Stairs-Rails: Left Home Layout: One level Home Equipment: Walker - 2 wheels;Bedside commode;Shower seat Prior Function Level of Independence: Independent with assistive device(s) Comments: husband does all of the cooking and cleaning    Cognition  Cognition Arousal/Alertness: Awake/alert Behavior During Therapy: WFL for tasks assessed/performed Overall Cognitive Status: Within Functional Limits for tasks assessed    Extremity/Trunk Assessment Upper Extremity Assessment Upper Extremity Assessment: Generalized weakness Lower Extremity Assessment Lower Extremity Assessment: Generalized weakness   Balance Static Standing Balance Static Standing - Balance Support: No upper extremity supported Static Standing - Level of Assistance: 5: Stand by assistance Static Standing - Comment/# of  Minutes: pt performing pericare with supervision   End of Session PT - End of Session Equipment Utilized During Treatment: Oxygen Activity Tolerance: Other (comment) (diarrhea) Patient left: Other (comment);with call bell/phone within reach (on 3in1 ) Nurse Communication: Mobility status;Patient requests pain meds  GP     Ludger Nutting 10/01/2012, 5:27 PM

## 2012-10-01 NOTE — Clinical Social Work Note (Signed)
Clinical Social Worker received referral by MD for SNF placement before PT/OT evaluated. CSW will await recommedations from PT/OT on appropriate discharge plans.   Rozetta Nunnery MSW, [employer] [phone]

## 2012-10-01 NOTE — Progress Notes (Signed)
[facility]                                                                                Patient Demographics  [name], is a 57 y.o. female, DOB - 04-02-1955, OZH:[id number], GEX:[id number]  Admit date - 09/29/2012  Admitting Physician Nyoka Cowden, MD  Outpatient Primary MD for the patient is [name], NP  LOS - 2   Chief Complaint  Patient presents with  . Weakness        Assessment & Plan   Summary.   This is a 57 year old female who was recently admitted for C. difficile colitis and discharged on a vancomycin taper, however post discharge patient was unable to get a vancomycin refill due to problems with her outpatient pharmacy, she presented back to weeks later with worsening diarrhea, dehydration, acute renal failure and hyperkalemia. She was initially seen by pulmonary critical care once stabilized she was transferred to hospitalist service.    Assessment and plan   1. Severe area causing dehydration and hypotension secondary to severe C. difficile colitis. Unfortunately patient was unable to get her vancomycin prescribed during her last admission post discharge due to issues with pharmacy, C. difficile certainly has persisted and has become somewhat more aggressive, she has been started appropriately on vancomycin and Flagyl combination with good effect, leukocytosis and IV are improving. Continue supportive care with IV fluids and monitor. Discussed with ID over the phone.   2. Dehydration with acute renal failure secondary to severe diarrhea. Improved with IV fluids continue to monitor.    3. DM type II- on sliding scale insulin.  Lab Results  Component Value Date   HGBA1C 6.4* 02/29/2012    CBG (last 3)   Recent Labs  09/30/12 2203 10/01/12 0802 10/01/12 1143  GLUCAP 82 81 133*    4. History of CAD on daily aspirin, blood pressure soft hold antihypertensive medications. No chest pain or cardiac issues.    5. Acute delirium  during admission under critical care service due to metabolic encephalopathy from #1 above. Has completely resolved.     Code Status: Full  Family Communication: Husband and daughter bedside  Disposition Plan: home   Procedures KUB   Consults  ID Dr. Luciana Axe over the phone   DVT Prophylaxis    Heparin    Lab Results  Component Value Date   PLT 496* 10/01/2012    Medications  Scheduled Meds: . aspirin EC  81 mg Oral Daily  . heparin  5,000 Units Subcutaneous Q8H  . insulin aspart  0-15 Units Subcutaneous TID WC  . lactose free nutrition  237 mL Oral TID WC  . metroNIDAZOLE  500 mg Oral Q8H  . vancomycin  500 mg Oral QID   Continuous Infusions: . sodium chloride 75 mL/hr at 10/01/12 1319   PRN Meds:.albuterol, ipratropium, morphine injection, ondansetron (ZOFRAN) IV, ondansetron  Antibiotics     Anti-infectives   Start     Dose/Rate Route Frequency Ordered Stop   09/30/12 1400  metroNIDAZOLE (FLAGYL) tablet 500 mg     500 mg Oral 3 times per day 09/30/12 1018 10/14/12 1359   09/30/12 0200  metroNIDAZOLE (FLAGYL) IVPB 500 mg  Status:  Discontinued     500 mg 100 mL/hr over 60 Minutes Intravenous Every 8 hours 09/29/12 1749 09/30/12 1016   09/29/12 2300  vancomycin (VANCOCIN) 50 mg/mL oral solution 500 mg     500 mg Oral 4 times daily 09/29/12 1749 10/14/12 0959   09/29/12 1800  vancomycin (VANCOCIN) 50 mg/mL oral solution 500 mg     500 mg Oral NOW 09/29/12 1749 09/29/12 1820   09/29/12 1800  metroNIDAZOLE (FLAGYL) IVPB 500 mg     500 mg 100 mL/hr over 60 Minutes Intravenous NOW 09/29/12 1749 09/29/12 2026       Time Spent in minutes   45   SINGH,PRASHANT K M.D on 10/01/2012 at 2:20 PM  Between 7am to 7pm - Pager - [phone]  After 7pm go to [email or web address] - password [email or web address]  And look for the night coverage person covering for me after hours  [facility] Office  [phone]    Subjective:   [name] today has, No headache, No  chest pain, No abdominal pain does have ongoing diarrhea- No Nausea, No new weakness tingling or numbness, No Cough - SOB.    Objective:   Filed Vitals:   09/30/12 2152 10/01/12 0442 10/01/12 1010 10/01/12 1358  BP:  134/44 109/59 110/60  Pulse:  80 77 72  Temp:  97.5 F (36.4 C)  97.8 F (36.6 C)  TempSrc:  Oral  Oral  Resp:  16 18 18   Height:      Weight:      SpO2: 100% 100% 100% 100%    Wt Readings from Last 3 Encounters:  09/29/12 106.8 kg (235 lb 7.2 oz)  09/16/12 106.777 kg (235 lb 6.4 oz)  05/15/12 96.979 kg (213 lb 12.8 oz)     Intake/Output Summary (Last 24 hours) at 10/01/12 1420 Last data filed at 10/01/12 1319  Gross per 24 hour  Intake   2646 ml  Output      0 ml  Net   2646 ml    Exam Awake Alert, Oriented X 3, No new F.N deficits, Normal affect [place].AT,PERRAL Supple Neck,No JVD, No cervical lymphadenopathy appriciated.  Symmetrical Chest wall movement, Good air movement bilaterally, CTAB RRR,No Gallops,Rubs or new Murmurs, No Parasternal Heave +ve B.Sounds, Abd mildly distended but Soft, Non tender, No organomegaly appriciated, No rebound - guarding or rigidity. No Cyanosis, Clubbing or edema, No new Rash or bruise      Data Review   Micro Results Recent Results (from the past 240 hour(s))  CULTURE, BLOOD (ROUTINE X 2)     Status: None   Collection Time    09/29/12  7:22 PM      Result Value Range Status   Specimen Description BLOOD ARM RIGHT   Final   Special Requests BOTTLES DRAWN AEROBIC ONLY 3CC   Final   Culture  Setup Time 09/30/2012 00:34   Final   Culture     Final   Value:        BLOOD CULTURE RECEIVED NO GROWTH TO DATE CULTURE WILL BE HELD FOR 5 DAYS BEFORE ISSUING A FINAL NEGATIVE REPORT   Report Status PENDING   Incomplete  CULTURE, BLOOD (ROUTINE X 2)     Status: None   Collection Time    09/29/12  8:07 PM      Result Value Range Status   Specimen Description BLOOD HAND RIGHT   Final   Special Requests BOTTLES DRAWN AEROBIC ONLY  2CC   Final  Culture  Setup Time 09/30/2012 00:34   Final   Culture     Final   Value:        BLOOD CULTURE RECEIVED NO GROWTH TO DATE CULTURE WILL BE HELD FOR 5 DAYS BEFORE ISSUING A FINAL NEGATIVE REPORT   Report Status PENDING   Incomplete  MRSA PCR SCREENING     Status: None   Collection Time    09/29/12 10:17 PM      Result Value Range Status   MRSA by PCR NEGATIVE  NEGATIVE Final   Comment:            The GeneXpert MRSA Assay (FDA     approved for NASAL specimens     only), is one component of a     comprehensive MRSA colonization     surveillance program. It is not     intended to diagnose MRSA     infection nor to guide or     monitor treatment for     MRSA infections.    Radiology Reports Dg Chest Port 1 View  09/14/2012   *RADIOLOGY REPORT*  Clinical Data: Shortness of breath  PORTABLE CHEST - 1 VIEW  Comparison: 09/11/2012  Findings: Shallow inspiration.  Heart size and pulmonary vascularity are normal.  There is interval development of consolidation in the medial right lung base, likely in the right middle lung, suggesting pneumonia.  Mild atelectasis in the left lung base.  No pneumothorax.  No blunting of costophrenic angles.  IMPRESSION: Infiltration and volume loss in the right lung base medially suggesting pneumonia.   Original Report Authenticated By: Burman Nieves, M.D.   Portable Chest Xray In Am  09/11/2012   *RADIOLOGY REPORT*  Clinical Data: Endotracheal tube placement, shortness of breath  PORTABLE CHEST - 1 VIEW  Comparison: Portable chest x-ray of 09/10/2012  Findings: The tip of the endotracheal tube is approximately 2.9 cm above the carina.  Aeration has improved slightly with mild basilar atelectasis remaining.  The right central venous line is unchanged in position and cardiomegaly is stable.  IMPRESSION: Endotracheal tube tip 2.9 cm above carina.  Slightly improved aeration.   Original Report Authenticated By: Dwyane Dee, M.D.   Dg Chest Portable 1  View  09/10/2012   *RADIOLOGY REPORT*  Clinical Data: Sepsis, central line placement  PORTABLE CHEST - 1 VIEW  Comparison: Portable exam 1457 hours compared to 09/09/2012  Findings: Tip of endotracheal tube approximately 2.5 cm above carina. Nasogastric tube extends into stomach. Right jugular central venous catheter tip projects over SVC. Enlargement of cardiac silhouette with pulmonary vascular congestion. Mild diffuse bilateral interstitial infiltrates question pulmonary edema. Mild basilar hypoinflation. No gross pleural effusion or pneumothorax.  IMPRESSION: No pneumothorax following central line placement. Probable mild pulmonary edema and bibasilar atelectasis.   Original Report Authenticated By: Ulyses Southward, M.D.   Dg Chest Portable 1 View  09/09/2012   *RADIOLOGY REPORT*  Clinical Data: 57 year old female with respiratory failure. Diarrhea.  PORTABLE CHEST - 1 VIEW  Comparison: 04/08/2012 and earlier.  Findings: Semi upright AP portable view 1750 hours.  Stable lung volumes.  Stable cardiac size and mediastinal contours.  No pneumothorax or pulmonary edema.  No pleural effusion or confluent pulmonary opacity.  Stable mild contour deformity associated with a small fat containing right diaphragmatic hernia (see 03/24/2012 CTA).  IMPRESSION: No acute cardiopulmonary abnormality.   Original Report Authenticated By: Erskine Speed, M.D.   Dg Abd Portable 1v  10/01/2012   *RADIOLOGY REPORT*  Clinical  Data: Abdominal pain history of C difficile colitis  PORTABLE ABDOMEN - 1 VIEW  Comparison: Abdominal radiograph 09/10/2012 and 05/03/2012  Findings: Cholecystectomy clips are noted.  The bowel gas pattern is nonobstructive.  No significant stool burden is seen.  No abdominal mass effect.  Tubal ligation clips project over the pelvis.  The visualized lung bases are clear.  IMPRESSION: Nonobstructive bowel gas pattern.   Original Report Authenticated By: Britta Mccreedy, M.D.   Dg Abd Portable 1v  09/10/2012    *RADIOLOGY REPORT*  Clinical Data: Diarrhea, evaluate for possible colonic distention  PORTABLE ABDOMEN - 1 VIEW  Comparison: Abdomen films of 05/03/2012  Findings: A supine film of the abdomen does show apparent edema within the mucosa of the right colon and transverse colon with some "thumbprinting".  However, no bowel distention is seen.  No opaque calculi are noted.  Surgical clips are present in the right upper quadrant from prior cholecystectomy with clips in the left lower quadrant and mid pelvis as well probably due to prior bilateral tubal ligation.  IMPRESSION:  1.  No bowel distention. 2.  Edema of the mucosa of the colon consistent with colitis.   Original Report Authenticated By: Dwyane Dee, M.D.    CBC  Recent Labs Lab 09/29/12 1622 09/29/12 2236 09/30/12 0342 10/01/12 0535  WBC 36.3* 33.3* 32.4* 24.8*  HGB 11.4* 12.0 12.0 12.2  HCT 34.9* 38.0 37.5 39.6  PLT 395 436* 457* 496*  MCV 83.3 85.4 85.4 87.2  MCH 27.2 27.0 27.3 26.9  MCHC 32.7 31.6 32.0 30.8  RDW 16.5* 16.5* 16.7* 17.2*  LYMPHSABS 1.8  --   --   --   MONOABS 2.9*  --   --   --   EOSABS 0.0  --   --   --   BASOSABS 0.0  --   --   --     Chemistries   Recent Labs Lab 09/29/12 1622 09/29/12 2236 09/30/12 0342 10/01/12 0535  NA 123* 127* 129* 131*  K 5.3* 5.1 5.2* 5.0  CL 87* 91* 96 97  CO2 24 25 24 21   GLUCOSE 163* 128* 96 81  BUN 46* 45* 42* 34*  CREATININE 2.62* 2.37* 2.05* 1.34*  CALCIUM 7.9* 7.9* 7.2* 7.7*  MG  --   --  1.6  --   AST 12  --   --   --   ALT 7  --   --   --   ALKPHOS 67  --   --   --   BILITOT 0.2*  --   --   --    ------------------------------------------------------------------------------------------------------------------ estimated creatinine clearance is 55.6 ml/min (by C-G formula based on Cr of 1.34). ------------------------------------------------------------------------------------------------------------------ No results found for this basename: HGBA1C,  in the  last 72 hours ------------------------------------------------------------------------------------------------------------------ No results found for this basename: CHOL, HDL, LDLCALC, TRIG, CHOLHDL, LDLDIRECT,  in the last 72 hours ------------------------------------------------------------------------------------------------------------------ No results found for this basename: TSH, T4TOTAL, FREET3, T3FREE, THYROIDAB,  in the last 72 hours ------------------------------------------------------------------------------------------------------------------ No results found for this basename: VITAMINB12, FOLATE, FERRITIN, TIBC, IRON, RETICCTPCT,  in the last 72 hours  Coagulation profile No results found for this basename: INR, PROTIME,  in the last 168 hours  No results found for this basename: DDIMER,  in the last 72 hours  Cardiac Enzymes  Recent Labs Lab 09/29/12 2236  TROPONINI <0.30   ------------------------------------------------------------------------------------------------------------------ No components found with this basename: POCBNP,

## 2012-10-02 LAB — BASIC METABOLIC PANEL
CO2: 22 mEq/L (ref 19–32)
Chloride: 100 mEq/L (ref 96–112)
Glucose, Bld: 194 mg/dL — ABNORMAL HIGH (ref 70–99)
Sodium: 131 mEq/L — ABNORMAL LOW (ref 135–145)

## 2012-10-02 LAB — POTASSIUM: Potassium: 4.8 mEq/L (ref 3.5–5.1)

## 2012-10-02 LAB — GLUCOSE, CAPILLARY
Glucose-Capillary: 195 mg/dL — ABNORMAL HIGH (ref 70–99)
Glucose-Capillary: 254 mg/dL — ABNORMAL HIGH (ref 70–99)
Glucose-Capillary: 125 mg/dL — ABNORMAL HIGH (ref 70–99)

## 2012-10-02 LAB — CBC
HCT: 42.1 % (ref 36.0–46.0)
MCV: 85.7 fL (ref 78.0–100.0)
RBC: 4.91 MIL/uL (ref 3.87–5.11)
WBC: 19.3 10*3/uL — ABNORMAL HIGH (ref 4.0–10.5)

## 2012-10-02 MED ORDER — SERTRALINE HCL 50 MG PO TABS
50.0000 mg | ORAL_TABLET | Freq: Every day | ORAL | Status: DC
  Administered 2012-10-02 – 2012-10-04 (×3): 50 mg via ORAL
  Filled 2012-10-02 (×3): qty 1

## 2012-10-02 MED ORDER — ISOSORBIDE MONONITRATE 15 MG HALF TABLET
15.0000 mg | ORAL_TABLET | Freq: Every day | ORAL | Status: DC
  Administered 2012-10-02 – 2012-10-04 (×3): 15 mg via ORAL
  Filled 2012-10-02 (×3): qty 1

## 2012-10-02 MED ORDER — FUROSEMIDE 40 MG PO TABS
40.0000 mg | ORAL_TABLET | Freq: Every day | ORAL | Status: DC
  Administered 2012-10-02: 40 mg via ORAL
  Filled 2012-10-02: qty 1

## 2012-10-02 MED ORDER — FUROSEMIDE 40 MG PO TABS
40.0000 mg | ORAL_TABLET | Freq: Two times a day (BID) | ORAL | Status: DC
  Administered 2012-10-02 – 2012-10-04 (×4): 40 mg via ORAL
  Filled 2012-10-02 (×5): qty 1

## 2012-10-02 MED ORDER — ROPINIROLE HCL 1 MG PO TABS
1.0000 mg | ORAL_TABLET | Freq: Every day | ORAL | Status: DC
  Administered 2012-10-02 – 2012-10-03 (×2): 1 mg via ORAL
  Filled 2012-10-02 (×3): qty 1

## 2012-10-02 MED ORDER — PRO-STAT SUGAR FREE PO LIQD
30.0000 mL | Freq: Three times a day (TID) | ORAL | Status: DC
  Administered 2012-10-02 – 2012-10-04 (×7): 30 mL via ORAL
  Filled 2012-10-02 (×7): qty 30

## 2012-10-02 MED ORDER — BIOTENE DRY MOUTH MT LIQD: 15.0000 mL | Freq: Four times a day (QID) | OROMUCOSAL | Status: DC

## 2012-10-02 MED ORDER — SODIUM CHLORIDE 0.9 % IV SOLN: INTRAVENOUS | Status: DC

## 2012-10-02 MED ORDER — VANCOMYCIN 50 MG/ML ORAL SOLUTION: ORAL | Status: DC

## 2012-10-02 MED ORDER — SODIUM POLYSTYRENE SULFONATE 15 GM/60ML PO SUSP
30.0000 g | Freq: Once | ORAL | Status: AC
  Administered 2012-10-02: 30 g via ORAL
  Filled 2012-10-02: qty 120

## 2012-10-02 NOTE — Progress Notes (Signed)
Pt stated to me that she never took Vancomycin at home.

## 2012-10-02 NOTE — Progress Notes (Signed)
Inpatient Diabetes Program Recommendations  AACE/ADA: New Consensus Statement on Inpatient Glycemic Control (2013)  Target Ranges:  Prepandial:   less than 140 mg/dL      Peak postprandial:   less than 180 mg/dL (1-2 hours)      Critically ill patients:  140 - 180 mg/dL   Reason for Visit: Results for [name], [name] (MRN [id number]) as of 10/02/2012 15:45  Ref. Range 10/01/2012 21:11 10/02/2012 08:17 10/02/2012 12:45  Glucose-Capillary Latest Range: 70-99 mg/dL 409 (H) 811 (H) 914 (H)   Please restart Lantus 20 units daily (note patient was on Lantus 32 units at home prior to admit).

## 2012-10-02 NOTE — Progress Notes (Addendum)
[facility]                                                                                Patient Demographics  [name], is a 57 y.o. female, DOB - 04-21-1955, ZOX:[id number], WJX:[id number]  Admit date - 09/29/2012  Admitting Physician Nyoka Cowden, MD  Outpatient Primary MD for the patient is [name], NP  LOS - 3   Chief Complaint  Patient presents with  . Weakness        Assessment & Plan   Summary.   This is a 57 year old female who was recently admitted for C. difficile colitis and discharged on a vancomycin taper, however post discharge patient was unable to get a vancomycin refill due to problems with her outpatient pharmacy, she presented back to weeks later with worsening diarrhea, dehydration, acute renal failure and hyperkalemia. She was initially seen by pulmonary critical care once stabilized she was transferred to hospitalist service.    Assessment and plan   1. Severe area causing dehydration and hypotension secondary to severe C. difficile colitis. Unfortunately patient was unable to get her vancomycin prescribed during her last admission post discharge due to issues with pharmacy, C. difficile certainly has persisted and has become somewhat more aggressive, she has been started appropriately on vancomycin and Flagyl combination with good effect, she is clinically much improved, leukocytosis is much better as is her diarrhea. Discussed with ID over the phone.   2. Dehydration with acute renal failure secondary to severe diarrhea. Improved with IV fluids, she has some third space fluid now in our abdomen and vulvar area, stop IV fluids, home dose Lasix resume.    3. DM type II- on sliding scale insulin.  Lab Results  Component Value Date   HGBA1C 6.4* 02/29/2012    CBG (last 3)   Recent Labs  10/01/12 1726 10/01/12 2111 10/02/12 0817  GLUCAP 109* 167* 195*    4. History of CAD on daily aspirin, blood pressure improved we will  resume home dose Imdur. No chest pain or cardiac issues.    5. Acute delirium during admission under critical care service due to metabolic encephalopathy from #1 above. Has completely resolved.    6. Hyperkalemia on 10/02/2012. Home dose Lasix resume, one dose Kayexalate. Repeat potassium in the evening.      Code Status: Full  Family Communication: Husband and daughter bedside  Disposition Plan: home, daughter and patient were overtly concerned that she's not strong enough to go home, patient has been evaluated by PT, also seen by social work. Will not qualify for placement to SNF or rehabilitation.   Procedures KUB   Consults  ID Dr. Luciana Axe over the phone   DVT Prophylaxis    Heparin    Lab Results  Component Value Date   PLT 578* 10/02/2012    Medications  Scheduled Meds: . antiseptic oral rinse  15 mL Mouth Rinse QID  . aspirin EC  81 mg Oral Daily  . furosemide  40 mg Oral BID  . heparin  5,000 Units Subcutaneous Q8H  . insulin aspart  0-15 Units Subcutaneous TID WC  . isosorbide mononitrate  15 mg Oral Daily  .  lactose free nutrition  237 mL Oral TID WC  . metroNIDAZOLE  500 mg Oral Q8H  . rOPINIRole  1 mg Oral QHS  . sertraline  50 mg Oral Daily  . sodium polystyrene  30 g Oral Once  . vancomycin  500 mg Oral QID   Continuous Infusions:   PRN Meds:.albuterol, ipratropium, morphine injection, ondansetron (ZOFRAN) IV, ondansetron  Antibiotics     Anti-infectives   Start     Dose/Rate Route Frequency Ordered Stop   10/02/12 0000  vancomycin (VANCOCIN) 50 mg/mL oral solution        10/02/12 0922     09/30/12 1400  metroNIDAZOLE (FLAGYL) tablet 500 mg     500 mg Oral 3 times per day 09/30/12 1018 10/14/12 1359   09/30/12 0200  metroNIDAZOLE (FLAGYL) IVPB 500 mg  Status:  Discontinued     500 mg 100 mL/hr over 60 Minutes Intravenous Every 8 hours 09/29/12 1749 09/30/12 1016   09/29/12 2300  vancomycin (VANCOCIN) 50 mg/mL oral solution 500 mg     500  mg Oral 4 times daily 09/29/12 1749 10/14/12 0959   09/29/12 1800  vancomycin (VANCOCIN) 50 mg/mL oral solution 500 mg     500 mg Oral NOW 09/29/12 1749 09/29/12 1820   09/29/12 1800  metroNIDAZOLE (FLAGYL) IVPB 500 mg     500 mg 100 mL/hr over 60 Minutes Intravenous NOW 09/29/12 1749 09/29/12 2026       Time Spent in minutes   45   Kingsley Herandez K M.D on 10/02/2012 at 10:31 AM  Between 7am to 7pm - Pager - [phone]  After 7pm go to [email or web address] - password [email or web address]  And look for the night coverage person covering for me after hours  [facility] Office  [phone]    Subjective:   [name] today has, No headache, No chest pain, No abdominal pain does have ongoing diarrhea but it has improved- No Nausea, No new weakness tingling or numbness, No Cough - SOB.    Objective:   Filed Vitals:   10/01/12 1358 10/01/12 2210 10/02/12 0321 10/02/12 0703  BP: 110/60 135/69  135/69  Pulse: 72 78  84  Temp: 97.8 F (36.6 C) 98.2 F (36.8 C)  97.6 F (36.4 C)  TempSrc: Oral Oral  Oral  Resp: 18 20  18   Height:      Weight:      SpO2: 100% 100% 98% 100%    Wt Readings from Last 3 Encounters:  09/29/12 106.8 kg (235 lb 7.2 oz)  09/16/12 106.777 kg (235 lb 6.4 oz)  05/15/12 96.979 kg (213 lb 12.8 oz)     Intake/Output Summary (Last 24 hours) at 10/02/12 1031 Last data filed at 10/02/12 1011  Gross per 24 hour  Intake 1839.25 ml  Output      0 ml  Net 1839.25 ml    Exam Awake Alert, Oriented X 3, No new F.N deficits, Normal affect Brunson.AT,PERRAL Supple Neck,No JVD, No cervical lymphadenopathy appriciated.  Symmetrical Chest wall movement, Good air movement bilaterally, CTAB RRR,No Gallops,Rubs or new Murmurs, No Parasternal Heave +ve B.Sounds, Abd much less distended but Soft there is some vulvar edema and soft tissue edema in thighs, Non tender, No organomegaly appriciated, No rebound - guarding or rigidity. No Cyanosis, Clubbing or edema, No new  Rash or bruise      Data Review   Micro Results Recent Results (from the past 240 hour(s))  CULTURE, BLOOD (ROUTINE X 2)  Status: None   Collection Time    09/29/12  7:22 PM      Result Value Range Status   Specimen Description BLOOD ARM RIGHT   Final   Special Requests BOTTLES DRAWN AEROBIC ONLY 3CC   Final   Culture  Setup Time 09/30/2012 00:34   Final   Culture     Final   Value:        BLOOD CULTURE RECEIVED NO GROWTH TO DATE CULTURE WILL BE HELD FOR 5 DAYS BEFORE ISSUING A FINAL NEGATIVE REPORT   Report Status PENDING   Incomplete  CULTURE, BLOOD (ROUTINE X 2)     Status: None   Collection Time    09/29/12  8:07 PM      Result Value Range Status   Specimen Description BLOOD HAND RIGHT   Final   Special Requests BOTTLES DRAWN AEROBIC ONLY 2CC   Final   Culture  Setup Time 09/30/2012 00:34   Final   Culture     Final   Value:        BLOOD CULTURE RECEIVED NO GROWTH TO DATE CULTURE WILL BE HELD FOR 5 DAYS BEFORE ISSUING A FINAL NEGATIVE REPORT   Report Status PENDING   Incomplete  MRSA PCR SCREENING     Status: None   Collection Time    09/29/12 10:17 PM      Result Value Range Status   MRSA by PCR NEGATIVE  NEGATIVE Final   Comment:            The GeneXpert MRSA Assay (FDA     approved for NASAL specimens     only), is one component of a     comprehensive MRSA colonization     surveillance program. It is not     intended to diagnose MRSA     infection nor to guide or     monitor treatment for     MRSA infections.    Radiology Reports Dg Chest Port 1 View  09/14/2012   *RADIOLOGY REPORT*  Clinical Data: Shortness of breath  PORTABLE CHEST - 1 VIEW  Comparison: 09/11/2012  Findings: Shallow inspiration.  Heart size and pulmonary vascularity are normal.  There is interval development of consolidation in the medial right lung base, likely in the right middle lung, suggesting pneumonia.  Mild atelectasis in the left lung base.  No pneumothorax.  No blunting of  costophrenic angles.  IMPRESSION: Infiltration and volume loss in the right lung base medially suggesting pneumonia.   Original Report Authenticated By: Burman Nieves, M.D.   Portable Chest Xray In Am  09/11/2012   *RADIOLOGY REPORT*  Clinical Data: Endotracheal tube placement, shortness of breath  PORTABLE CHEST - 1 VIEW  Comparison: Portable chest x-ray of 09/10/2012  Findings: The tip of the endotracheal tube is approximately 2.9 cm above the carina.  Aeration has improved slightly with mild basilar atelectasis remaining.  The right central venous line is unchanged in position and cardiomegaly is stable.  IMPRESSION: Endotracheal tube tip 2.9 cm above carina.  Slightly improved aeration.   Original Report Authenticated By: Dwyane Dee, M.D.   Dg Chest Portable 1 View  09/10/2012   *RADIOLOGY REPORT*  Clinical Data: Sepsis, central line placement  PORTABLE CHEST - 1 VIEW  Comparison: Portable exam 1457 hours compared to 09/09/2012  Findings: Tip of endotracheal tube approximately 2.5 cm above carina. Nasogastric tube extends into stomach. Right jugular central venous catheter tip projects over SVC. Enlargement of cardiac silhouette with pulmonary  vascular congestion. Mild diffuse bilateral interstitial infiltrates question pulmonary edema. Mild basilar hypoinflation. No gross pleural effusion or pneumothorax.  IMPRESSION: No pneumothorax following central line placement. Probable mild pulmonary edema and bibasilar atelectasis.   Original Report Authenticated By: Ulyses Southward, M.D.   Dg Chest Portable 1 View  09/09/2012   *RADIOLOGY REPORT*  Clinical Data: 57 year old female with respiratory failure. Diarrhea.  PORTABLE CHEST - 1 VIEW  Comparison: 04/08/2012 and earlier.  Findings: Semi upright AP portable view 1750 hours.  Stable lung volumes.  Stable cardiac size and mediastinal contours.  No pneumothorax or pulmonary edema.  No pleural effusion or confluent pulmonary opacity.  Stable mild contour deformity  associated with a small fat containing right diaphragmatic hernia (see 03/24/2012 CTA).  IMPRESSION: No acute cardiopulmonary abnormality.   Original Report Authenticated By: Erskine Speed, M.D.   Dg Abd Portable 1v  10/01/2012   *RADIOLOGY REPORT*  Clinical Data: Abdominal pain history of C difficile colitis  PORTABLE ABDOMEN - 1 VIEW  Comparison: Abdominal radiograph 09/10/2012 and 05/03/2012  Findings: Cholecystectomy clips are noted.  The bowel gas pattern is nonobstructive.  No significant stool burden is seen.  No abdominal mass effect.  Tubal ligation clips project over the pelvis.  The visualized lung bases are clear.  IMPRESSION: Nonobstructive bowel gas pattern.   Original Report Authenticated By: Britta Mccreedy, M.D.   Dg Abd Portable 1v  09/10/2012   *RADIOLOGY REPORT*  Clinical Data: Diarrhea, evaluate for possible colonic distention  PORTABLE ABDOMEN - 1 VIEW  Comparison: Abdomen films of 05/03/2012  Findings: A supine film of the abdomen does show apparent edema within the mucosa of the right colon and transverse colon with some "thumbprinting".  However, no bowel distention is seen.  No opaque calculi are noted.  Surgical clips are present in the right upper quadrant from prior cholecystectomy with clips in the left lower quadrant and mid pelvis as well probably due to prior bilateral tubal ligation.  IMPRESSION:  1.  No bowel distention. 2.  Edema of the mucosa of the colon consistent with colitis.   Original Report Authenticated By: Dwyane Dee, M.D.    CBC  Recent Labs Lab 09/29/12 1622 09/29/12 2236 09/30/12 0342 10/01/12 0535 10/02/12 0525  WBC 36.3* 33.3* 32.4* 24.8* 19.3*  HGB 11.4* 12.0 12.0 12.2 13.5  HCT 34.9* 38.0 37.5 39.6 42.1  PLT 395 436* 457* 496* 578*  MCV 83.3 85.4 85.4 87.2 85.7  MCH 27.2 27.0 27.3 26.9 27.5  MCHC 32.7 31.6 32.0 30.8 32.1  RDW 16.5* 16.5* 16.7* 17.2* 17.3*  LYMPHSABS 1.8  --   --   --   --   MONOABS 2.9*  --   --   --   --   EOSABS 0.0  --    --   --   --   BASOSABS 0.0  --   --   --   --     Chemistries   Recent Labs Lab 09/29/12 1622 09/29/12 2236 09/30/12 0342 10/01/12 0535 10/02/12 0525  NA 123* 127* 129* 131* 131*  K 5.3* 5.1 5.2* 5.0 5.5*  CL 87* 91* 96 97 100  CO2 24 25 24 21 22   GLUCOSE 163* 128* 96 81 194*  BUN 46* 45* 42* 34* 24*  CREATININE 2.62* 2.37* 2.05* 1.34* 0.95  CALCIUM 7.9* 7.9* 7.2* 7.7* 8.3*  MG  --   --  1.6  --   --   AST 12  --   --   --   --  ALT 7  --   --   --   --   ALKPHOS 67  --   --   --   --   BILITOT 0.2*  --   --   --   --    ------------------------------------------------------------------------------------------------------------------ estimated creatinine clearance is 78.4 ml/min (by C-G formula based on Cr of 0.95). ------------------------------------------------------------------------------------------------------------------ No results found for this basename: HGBA1C,  in the last 72 hours ------------------------------------------------------------------------------------------------------------------ No results found for this basename: CHOL, HDL, LDLCALC, TRIG, CHOLHDL, LDLDIRECT,  in the last 72 hours ------------------------------------------------------------------------------------------------------------------ No results found for this basename: TSH, T4TOTAL, FREET3, T3FREE, THYROIDAB,  in the last 72 hours ------------------------------------------------------------------------------------------------------------------ No results found for this basename: VITAMINB12, FOLATE, FERRITIN, TIBC, IRON, RETICCTPCT,  in the last 72 hours  Coagulation profile No results found for this basename: INR, PROTIME,  in the last 168 hours  No results found for this basename: DDIMER,  in the last 72 hours  Cardiac Enzymes  Recent Labs Lab 09/29/12 2236  TROPONINI <0.30    ------------------------------------------------------------------------------------------------------------------ No components found with this basename: POCBNP,

## 2012-10-02 NOTE — Progress Notes (Signed)
Spoke with patient again regarding Vancomycin . She now recalls the reason why she did not get her Vancomycin prescription filled was " a mix up at her pharmacy " .  [facility] can fill Vancomycin ( 4 week supply ) for $45 . Patient states she can afford this . [facility] address and phone number given to patient .    Ronny Flurry RN BSN [phone]

## 2012-10-02 NOTE — Clinical Social Work Note (Signed)
Clinical Social Worker reviewed chart and will sign off, as patient is not in any need of SNF placement at discharge, per PT's evaluation. Please re-consult if new needs arises.   Rozetta Nunnery MSW, [employer] [phone]

## 2012-10-02 NOTE — Progress Notes (Signed)
Referral for assistance with Vancomycin . Spoke with patient she states she never missed a dose of Vancomycin and has prescription coverage .  Ronny Flurry RN BSN

## 2012-10-03 LAB — BASIC METABOLIC PANEL
GFR calc Af Amer: 83 mL/min — ABNORMAL LOW (ref >90)
GFR calc non Af Amer: 72 mL/min — ABNORMAL LOW (ref >90)
Potassium: 5 mEq/L (ref 3.5–5.1)
Sodium: 132 mEq/L — ABNORMAL LOW (ref 135–145)

## 2012-10-03 LAB — CBC
Hemoglobin: 13.1 g/dL (ref 12.0–15.0)
MCHC: 32.3 g/dL (ref 30.0–36.0)
RBC: 4.81 MIL/uL (ref 3.87–5.11)
WBC: 14.7 10*3/uL — ABNORMAL HIGH (ref 4.0–10.5)

## 2012-10-03 LAB — GLUCOSE, CAPILLARY
Glucose-Capillary: 178 mg/dL — ABNORMAL HIGH (ref 70–99)
Glucose-Capillary: 300 mg/dL — ABNORMAL HIGH (ref 70–99)

## 2012-10-03 MED ORDER — HYDROCODONE-ACETAMINOPHEN 5-325 MG PO TABS
2.0000 | ORAL_TABLET | ORAL | Status: DC | PRN
  Administered 2012-10-04 (×2): 2 via ORAL
  Filled 2012-10-03 (×2): qty 2

## 2012-10-03 MED ORDER — HYDROCODONE-ACETAMINOPHEN 5-325 MG PO TABS
1.0000 | ORAL_TABLET | ORAL | Status: DC | PRN
  Administered 2012-10-03 (×2): 1 via ORAL
  Filled 2012-10-03 (×2): qty 1

## 2012-10-03 NOTE — Progress Notes (Signed)
ANTIBIOTIC CONSULT NOTE - INITIAL  Pharmacy Consult for vanc and flagyl Indication: recurrent C.diff  Allergies  Allergen Reactions  . Ambien (Zolpidem Tartrate) Other (See Comments)    Severe hallucinations and behavior changes reported by family  . Cardizem (Diltiazem Hcl) Swelling  . Metformin And Related Swelling    Vital Signs: Temp: 97.9 F (36.6 C) (08/01 1432) Temp src: Oral (08/01 1432) BP: 113/62 mmHg (08/01 1432) Pulse Rate: 72 (08/01 1432) Intake/Output from previous day: 07/31 0701 - 08/01 0700 In: 240 [P.O.:240] Out: -  Intake/Output from this shift: Total I/O In: 480 [P.O.:480] Out: 1 [Stool:1]  Labs:  Recent Labs  10/01/12 0535 10/02/12 0525 10/03/12 0450  WBC 24.8* 19.3* 14.7*  HGB 12.2 13.5 13.1  PLT 496* 578* 607*  CREATININE 1.34* 0.95 0.87   Estimated Creatinine Clearance: 89.1 ml/min (by C-G formula based on Cr of 0.87). No results found for this basename: VANCOTROUGH, Leodis Binet, VANCORANDOM, GENTTROUGH, GENTPEAK, GENTRANDOM, TOBRATROUGH, TOBRAPEAK, TOBRARND, AMIKACINPEAK, AMIKACINTROU, AMIKACIN,  in the last 72 hours   Microbiology: Recent Results (from the past 720 hour(s))  CLOSTRIDIUM DIFFICILE BY PCR     Status: Abnormal   Collection Time    09/09/12  4:15 PM      Result Value Range Status   C difficile by pcr POSITIVE (*) NEGATIVE Final   Comment: CRITICAL RESULT CALLED TO, READ BACK BY AND VERIFIED WITH:     L.YOUNG RN 0309 09/10/12 E.GADDY  CULTURE, BLOOD (ROUTINE X 2)     Status: None   Collection Time    09/09/12  6:49 PM      Result Value Range Status   Specimen Description BLOOD HAND LEFT   Final   Special Requests BOTTLES DRAWN AEROBIC ONLY 3CC   Final   Culture  Setup Time 09/10/2012 01:16   Final   Culture NO GROWTH 5 DAYS   Final   Report Status 09/16/2012 FINAL   Final  CULTURE, BLOOD (ROUTINE X 2)     Status: None   Collection Time    09/09/12  7:10 PM      Result Value Range Status   Specimen Description BLOOD  HAND RIGHT   Final   Special Requests BOTTLES DRAWN AEROBIC ONLY 3CC   Final   Culture  Setup Time 09/10/2012 01:16   Final   Culture NO GROWTH 5 DAYS   Final   Report Status 09/16/2012 FINAL   Final  MRSA PCR SCREENING     Status: None   Collection Time    09/09/12 11:11 PM      Result Value Range Status   MRSA by PCR NEGATIVE  NEGATIVE Final   Comment:            The GeneXpert MRSA Assay (FDA     approved for NASAL specimens     only), is one component of a     comprehensive MRSA colonization     surveillance program. It is not     intended to diagnose MRSA     infection nor to guide or     monitor treatment for     MRSA infections.  URINE CULTURE     Status: None   Collection Time    09/10/12  9:19 AM      Result Value Range Status   Specimen Description URINE, CATHETERIZED   Final   Special Requests NONE   Final   Culture  Setup Time 09/10/2012 10:15   Final   Colony Count  NO GROWTH   Final   Culture NO GROWTH   Final   Report Status 09/11/2012 FINAL   Final  URINE CULTURE     Status: None   Collection Time    09/10/12  4:27 PM      Result Value Range Status   Specimen Description URINE, CATHETERIZED   Final   Special Requests NONE   Final   Culture  Setup Time 09/10/2012 23:24   Final   Colony Count NO GROWTH   Final   Culture NO GROWTH   Final   Report Status 09/11/2012 FINAL   Final  CULTURE, BLOOD (ROUTINE X 2)     Status: None   Collection Time    09/14/12  7:35 AM      Result Value Range Status   Specimen Description BLOOD LEFT HAND   Final   Special Requests BOTTLES DRAWN AEROBIC ONLY 1CC   Final   Culture  Setup Time 09/14/2012 15:16   Final   Culture NO GROWTH 5 DAYS   Final   Report Status 09/20/2012 FINAL   Final  CULTURE, BLOOD (ROUTINE X 2)     Status: None   Collection Time    09/14/12  7:40 AM      Result Value Range Status   Specimen Description BLOOD LEFT HAND   Final   Special Requests BOTTLES DRAWN AEROBIC ONLY 3CC   Final   Culture  Setup  Time 09/14/2012 15:14   Final   Culture NO GROWTH 5 DAYS   Final   Report Status 09/20/2012 FINAL   Final  CULTURE, BLOOD (ROUTINE X 2)     Status: None   Collection Time    09/29/12  7:22 PM      Result Value Range Status   Specimen Description BLOOD ARM RIGHT   Final   Special Requests BOTTLES DRAWN AEROBIC ONLY 3CC   Final   Culture  Setup Time 09/30/2012 00:34   Final   Culture     Final   Value:        BLOOD CULTURE RECEIVED NO GROWTH TO DATE CULTURE WILL BE HELD FOR 5 DAYS BEFORE ISSUING A FINAL NEGATIVE REPORT   Report Status PENDING   Incomplete  CULTURE, BLOOD (ROUTINE X 2)     Status: None   Collection Time    09/29/12  8:07 PM      Result Value Range Status   Specimen Description BLOOD HAND RIGHT   Final   Special Requests BOTTLES DRAWN AEROBIC ONLY 2CC   Final   Culture  Setup Time 09/30/2012 00:34   Final   Culture     Final   Value:        BLOOD CULTURE RECEIVED NO GROWTH TO DATE CULTURE WILL BE HELD FOR 5 DAYS BEFORE ISSUING A FINAL NEGATIVE REPORT   Report Status PENDING   Incomplete  MRSA PCR SCREENING     Status: None   Collection Time    09/29/12 10:17 PM      Result Value Range Status   MRSA by PCR NEGATIVE  NEGATIVE Final   Comment:            The GeneXpert MRSA Assay (FDA     approved for NASAL specimens     only), is one component of a     comprehensive MRSA colonization     surveillance program. It is not     intended to diagnose MRSA     infection  nor to guide or     monitor treatment for     MRSA infections.    Medical History: Past Medical History  Diagnosis Date  . COPD (chronic obstructive pulmonary disease)   . Respiratory failure   . Morbid obesity   . Tobacco abuse   . GERD (gastroesophageal reflux disease)   . Hyperlipidemia   . Chronic pain   . Hypertension   . Heart attack   . Arthritis   . Allergic rhinitis   . Diabetes mellitus, type 2   . Diabetes mellitus type 2 in obese 02/29/2012  . HTN (hypertension) 02/29/2012  . C.  difficile diarrhea 09/2012  . Chronic kidney disease 09/2012    acute renal failure  . Shock 09/2012    Assessment: Patient is a 57 y.o F with recurrent C. Diff who was recently discharged from [facility] on 09/16/12 with prescription for PO vancomycin (125mg  q6h) but this was never filled.  Patient's daughter called the ID clinic to report that her mom was drowsy with persistent diarrhea and was advised by the clinic to come to [facility] ED.  Patient  has wbc 14.7 and scr 0.87.    Plan:  1. Continue Vancomycin 500mg  PO Q6h x 14 days 2. Continue Flagyl 500mg  PO q8h x 14 days 3. Monitor renal function, C&S, clinical status  Anabel Bene, PharmD Clinical Pharmacist Pager: [phone]

## 2012-10-03 NOTE — Progress Notes (Signed)
Physical Therapy Treatment Patient Details Name: [name] MRN: [id number] DOB: 01/30/1956 Today's Date: 10/03/2012 Time: 1345-1406 PT Time Calculation (min): 21 min  PT Assessment / Plan / Recommendation  History of Present Illness Patient is a 57 yo female admitted in December with influenza and pna.  At end of admission, with C-dfif.  Intubated and with trach.  To [facility] - recently discharged home.  Now readmitted with persistent diarrhea and septic shock.    PT Comments   Patient able to ambulate today with RW. Limited by fatigue however moving well. Declines HHPT, she doesn't feel she needs it.   Follow Up Recommendations  No PT follow up (pt declines HHPT)     Does the patient have the potential to tolerate intense rehabilitation     Barriers to Discharge        Equipment Recommendations  None recommended by PT    Recommendations for Other Services    Frequency Min 3X/week   Progress towards PT Goals Progress towards PT goals: Progressing toward goals  Plan Current plan remains appropriate    Precautions / Restrictions Precautions Precautions: None   Pertinent Vitals/Pain Reports cramping in her stomach   Mobility  Transfers Transfers: Sit to Stand;Stand to Sit Sit to Stand: 6: Modified independent (Device/Increase time) Stand to Sit: 6: Modified independent (Device/Increase time) Ambulation/Gait Ambulation/Gait Assistance: 5: Supervision;6: Modified independent (Device/Increase time) Ambulation Distance (Feet): 400 Feet Assistive device: Rolling walker Ambulation/Gait Assistance Details: supervision for short distances without RW, pt reports her feet feel funny because of her diabetes, with RW pt modified independent but slow due to fatigue and cramping in her abdomen, used depends to prevent keep her walking further without having to sit on the 3in1 Gait Pattern: Step-through pattern;Decreased stride length;Wide base of support      PT Goals  (current goals can now be found in the care plan section) Acute Rehab PT Goals Patient Stated Goal: To return home, to stop having diarrhea, decrease pain  Visit Information  Last PT Received On: 10/03/12 Assistance Needed: +1 History of Present Illness: Patient is a 57 yo female admitted in December with influenza and pna.  At end of admission, with C-dfif.  Intubated and with trach.  To [facility] - recently discharged home.  Now readmitted with persistent diarrhea and septic shock.     Subjective Data  Patient Stated Goal: To return home, to stop having diarrhea, decrease pain   Cognition  Cognition Arousal/Alertness: Awake/alert Behavior During Therapy: WFL for tasks assessed/performed Overall Cognitive Status: Within Functional Limits for tasks assessed    Balance     End of Session PT - End of Session Equipment Utilized During Treatment: Oxygen Activity Tolerance: Patient tolerated treatment well Patient left:  (on 3in1) Nurse Communication: Mobility status   GP     [name] H 10/03/2012, 2:14 PM

## 2012-10-03 NOTE — Progress Notes (Signed)
Inpatient Diabetes Program Recommendations  AACE/ADA: New Consensus Statement on Inpatient Glycemic Control (2013)  Target Ranges:  Prepandial:   less than 140 mg/dL      Peak postprandial:   less than 180 mg/dL (1-2 hours)      Critically ill patients:  140 - 180 mg/dL   Reason for Assessmemt: Hyperglycemia   Results for [name], [name] (MRN [id number]) as of 10/03/2012 14:49  Ref. Range 10/02/2012 08:17 10/02/2012 12:45 10/02/2012 16:55 10/02/2012 23:09 10/03/2012 08:15 10/03/2012 12:15  Glucose-Capillary Latest Range: 70-99 mg/dL 846 (H) 962 (H) 952 (H) 125 (H) 178 (H) 300 (H)   Note:  Please consider adding 16 to 20 units Lantus at HS to help with glycemic control.  Thank you.  Norelle Runnion S. Elsie Lincoln, RN, CNS, CDE Inpatient Diabetes Program, team pager [phone]

## 2012-10-03 NOTE — Progress Notes (Signed)
[facility]                                                                                Patient Demographics  Chanay Nugent, is a 57 y.o. female, DOB - 1955/03/10, ZOX:[id number], WJX:[id number]  Admit date - 09/29/2012  Admitting Physician Nyoka Cowden, MD  Outpatient Primary MD for the patient is [name], NP  LOS - 4   Chief Complaint  Patient presents with  . Weakness        Assessment & Plan   Summary.   This is a 58 year old female who was recently admitted for C. difficile colitis and discharged on a vancomycin taper, however post discharge patient was unable to get a vancomycin refill due to problems with her outpatient pharmacy, she presented back to weeks later with worsening diarrhea, dehydration, acute renal failure and hyperkalemia. She was initially seen by pulmonary critical care once stabilized she was transferred to hospitalist service.    Assessment and plan   1. Severe area causing dehydration and hypotension secondary to severe C. difficile colitis. Unfortunately patient was unable to get her vancomycin prescribed during her last admission post discharge due to issues with pharmacy, C. difficile certainly has persisted and has become somewhat more aggressive, she has been started appropriately on vancomycin and Flagyl combination with good effect, she is clinically much improved, leukocytosis is much better as is her diarrhea. Discussed with ID over the phone.Likely will discharge her in the morning 10-04-2012.   2. Dehydration with acute renal failure secondary to severe diarrhea. Improved with IV fluids, she has some third space fluid now in our abdomen and vulvar area, stop IV fluids, home dose Lasix resume.    3. DM type II- on sliding scale insulin.  Lab Results  Component Value Date   HGBA1C 6.4* 02/29/2012    CBG (last 3)   Recent Labs  10/02/12 1655 10/02/12 2309 10/03/12 0815  GLUCAP 285* 125* 178*    4. History of CAD  on daily aspirin, blood pressure improved we will resume home dose Imdur. No chest pain or cardiac issues.    5. Acute delirium during admission under critical care service due to metabolic encephalopathy from #1 above. Has completely resolved.    6. Hyperkalemia on 10/02/2012. Home dose Lasix resume, one dose Kayexalate. Resolved.      Code Status: Full  Family Communication: Husband and daughter bedside  Disposition Plan: home, daughter and patient were overtly concerned that she's not strong enough to go home, patient has been evaluated by PT, also seen by social work. Will not qualify for placement to SNF or rehabilitation.   Procedures KUB   Consults  ID Dr. Luciana Axe over the phone   DVT Prophylaxis    Heparin    Lab Results  Component Value Date   PLT 607* 10/03/2012    Medications  Scheduled Meds: . aspirin EC  81 mg Oral Daily  . feeding supplement  30 mL Oral TID WC  . furosemide  40 mg Oral BID  . heparin  5,000 Units Subcutaneous Q8H  . insulin aspart  0-15 Units Subcutaneous TID WC  . isosorbide mononitrate  15 mg Oral Daily  .  lactose free nutrition  237 mL Oral TID WC  . metroNIDAZOLE  500 mg Oral Q8H  . rOPINIRole  1 mg Oral QHS  . sertraline  50 mg Oral Daily  . vancomycin  500 mg Oral QID   Continuous Infusions:   PRN Meds:.albuterol, HYDROcodone-acetaminophen, ipratropium, ondansetron (ZOFRAN) IV, ondansetron  Antibiotics     Anti-infectives   Start     Dose/Rate Route Frequency Ordered Stop   10/02/12 0000  vancomycin (VANCOCIN) 50 mg/mL oral solution        10/02/12 0922     09/30/12 1400  metroNIDAZOLE (FLAGYL) tablet 500 mg     500 mg Oral 3 times per day 09/30/12 1018 10/14/12 1359   09/30/12 0200  metroNIDAZOLE (FLAGYL) IVPB 500 mg  Status:  Discontinued     500 mg 100 mL/hr over 60 Minutes Intravenous Every 8 hours 09/29/12 1749 09/30/12 1016   09/29/12 2300  vancomycin (VANCOCIN) 50 mg/mL oral solution 500 mg     500 mg Oral 4  times daily 09/29/12 1749 10/14/12 0959   09/29/12 1800  vancomycin (VANCOCIN) 50 mg/mL oral solution 500 mg     500 mg Oral NOW 09/29/12 1749 09/29/12 1820   09/29/12 1800  metroNIDAZOLE (FLAGYL) IVPB 500 mg     500 mg 100 mL/hr over 60 Minutes Intravenous NOW 09/29/12 1749 09/29/12 2026       Time Spent in minutes   45   Lavontay Kirk K M.D on 10/03/2012 at 12:21 PM  Between 7am to 7pm - Pager - [phone]  After 7pm go to [email or web address] - password [email or web address]  And look for the night coverage person covering for me after hours  [facility] Office  [phone]    Subjective:   Mauricia Mertens today has, No headache, No chest pain, No abdominal pain does have ongoing diarrhea but it has improved- No Nausea, No new weakness tingling or numbness, No Cough - SOB.  Overall feels much better today.  Objective:   Filed Vitals:   10/02/12 1352 10/02/12 2310 10/03/12 0605 10/03/12 0611  BP: 132/60 116/78  103/83  Pulse: 81 78  91  Temp: 97.9 F (36.6 C) 97.7 F (36.5 C)  97.3 F (36.3 C)  TempSrc: Oral Oral  Oral  Resp: 18 18  18   Height:      Weight:    114.76 kg (253 lb)  SpO2: 100% 100% 97% 99%    Wt Readings from Last 3 Encounters:  10/03/12 114.76 kg (253 lb)  09/16/12 106.777 kg (235 lb 6.4 oz)  05/15/12 96.979 kg (213 lb 12.8 oz)     Intake/Output Summary (Last 24 hours) at 10/03/12 1221 Last data filed at 10/03/12 1110  Gross per 24 hour  Intake    240 ml  Output      1 ml  Net    239 ml    Exam Awake Alert, Oriented X 3, No new F.N deficits, Normal affect [place].AT,PERRAL Supple Neck,No JVD, No cervical lymphadenopathy appriciated.  Symmetrical Chest wall movement, Good air movement bilaterally, CTAB RRR,No Gallops,Rubs or new Murmurs, No Parasternal Heave +ve B.Sounds, Abd much less distended but Soft there is some vulvar edema and soft tissue edema in thighs Which is gradually receding, Non tender, No organomegaly appriciated, No rebound -  guarding or rigidity. No Cyanosis, Clubbing or edema, No new Rash or bruise      Data Review   Micro Results Recent Results (from the past 240 hour(s))  CULTURE, BLOOD (ROUTINE  X 2)     Status: None   Collection Time    09/29/12  7:22 PM      Result Value Range Status   Specimen Description BLOOD ARM RIGHT   Final   Special Requests BOTTLES DRAWN AEROBIC ONLY 3CC   Final   Culture  Setup Time 09/30/2012 00:34   Final   Culture     Final   Value:        BLOOD CULTURE RECEIVED NO GROWTH TO DATE CULTURE WILL BE HELD FOR 5 DAYS BEFORE ISSUING A FINAL NEGATIVE REPORT   Report Status PENDING   Incomplete  CULTURE, BLOOD (ROUTINE X 2)     Status: None   Collection Time    09/29/12  8:07 PM      Result Value Range Status   Specimen Description BLOOD HAND RIGHT   Final   Special Requests BOTTLES DRAWN AEROBIC ONLY 2CC   Final   Culture  Setup Time 09/30/2012 00:34   Final   Culture     Final   Value:        BLOOD CULTURE RECEIVED NO GROWTH TO DATE CULTURE WILL BE HELD FOR 5 DAYS BEFORE ISSUING A FINAL NEGATIVE REPORT   Report Status PENDING   Incomplete  MRSA PCR SCREENING     Status: None   Collection Time    09/29/12 10:17 PM      Result Value Range Status   MRSA by PCR NEGATIVE  NEGATIVE Final   Comment:            The GeneXpert MRSA Assay (FDA     approved for NASAL specimens     only), is one component of a     comprehensive MRSA colonization     surveillance program. It is not     intended to diagnose MRSA     infection nor to guide or     monitor treatment for     MRSA infections.    Radiology Reports Dg Chest Port 1 View  09/14/2012   *RADIOLOGY REPORT*  Clinical Data: Shortness of breath  PORTABLE CHEST - 1 VIEW  Comparison: 09/11/2012  Findings: Shallow inspiration.  Heart size and pulmonary vascularity are normal.  There is interval development of consolidation in the medial right lung base, likely in the right middle lung, suggesting pneumonia.  Mild atelectasis in  the left lung base.  No pneumothorax.  No blunting of costophrenic angles.  IMPRESSION: Infiltration and volume loss in the right lung base medially suggesting pneumonia.   Original Report Authenticated By: Burman Nieves, M.D.   Portable Chest Xray In Am  09/11/2012   *RADIOLOGY REPORT*  Clinical Data: Endotracheal tube placement, shortness of breath  PORTABLE CHEST - 1 VIEW  Comparison: Portable chest x-ray of 09/10/2012  Findings: The tip of the endotracheal tube is approximately 2.9 cm above the carina.  Aeration has improved slightly with mild basilar atelectasis remaining.  The right central venous line is unchanged in position and cardiomegaly is stable.  IMPRESSION: Endotracheal tube tip 2.9 cm above carina.  Slightly improved aeration.   Original Report Authenticated By: Dwyane Dee, M.D.   Dg Chest Portable 1 View  09/10/2012   *RADIOLOGY REPORT*  Clinical Data: Sepsis, central line placement  PORTABLE CHEST - 1 VIEW  Comparison: Portable exam 1457 hours compared to 09/09/2012  Findings: Tip of endotracheal tube approximately 2.5 cm above carina. Nasogastric tube extends into stomach. Right jugular central venous catheter tip projects over SVC.  Enlargement of cardiac silhouette with pulmonary vascular congestion. Mild diffuse bilateral interstitial infiltrates question pulmonary edema. Mild basilar hypoinflation. No gross pleural effusion or pneumothorax.  IMPRESSION: No pneumothorax following central line placement. Probable mild pulmonary edema and bibasilar atelectasis.   Original Report Authenticated By: Ulyses Southward, M.D.   Dg Chest Portable 1 View  09/09/2012   *RADIOLOGY REPORT*  Clinical Data: 57 year old female with respiratory failure. Diarrhea.  PORTABLE CHEST - 1 VIEW  Comparison: 04/08/2012 and earlier.  Findings: Semi upright AP portable view 1750 hours.  Stable lung volumes.  Stable cardiac size and mediastinal contours.  No pneumothorax or pulmonary edema.  No pleural effusion or  confluent pulmonary opacity.  Stable mild contour deformity associated with a small fat containing right diaphragmatic hernia (see 03/24/2012 CTA).  IMPRESSION: No acute cardiopulmonary abnormality.   Original Report Authenticated By: Erskine Speed, M.D.   Dg Abd Portable 1v  10/01/2012   *RADIOLOGY REPORT*  Clinical Data: Abdominal pain history of C difficile colitis  PORTABLE ABDOMEN - 1 VIEW  Comparison: Abdominal radiograph 09/10/2012 and 05/03/2012  Findings: Cholecystectomy clips are noted.  The bowel gas pattern is nonobstructive.  No significant stool burden is seen.  No abdominal mass effect.  Tubal ligation clips project over the pelvis.  The visualized lung bases are clear.  IMPRESSION: Nonobstructive bowel gas pattern.   Original Report Authenticated By: Britta Mccreedy, M.D.   Dg Abd Portable 1v  09/10/2012   *RADIOLOGY REPORT*  Clinical Data: Diarrhea, evaluate for possible colonic distention  PORTABLE ABDOMEN - 1 VIEW  Comparison: Abdomen films of 05/03/2012  Findings: A supine film of the abdomen does show apparent edema within the mucosa of the right colon and transverse colon with some "thumbprinting".  However, no bowel distention is seen.  No opaque calculi are noted.  Surgical clips are present in the right upper quadrant from prior cholecystectomy with clips in the left lower quadrant and mid pelvis as well probably due to prior bilateral tubal ligation.  IMPRESSION:  1.  No bowel distention. 2.  Edema of the mucosa of the colon consistent with colitis.   Original Report Authenticated By: Dwyane Dee, M.D.    CBC  Recent Labs Lab 09/29/12 1622 09/29/12 2236 09/30/12 0342 10/01/12 0535 10/02/12 0525 10/03/12 0450  WBC 36.3* 33.3* 32.4* 24.8* 19.3* 14.7*  HGB 11.4* 12.0 12.0 12.2 13.5 13.1  HCT 34.9* 38.0 37.5 39.6 42.1 40.5  PLT 395 436* 457* 496* 578* 607*  MCV 83.3 85.4 85.4 87.2 85.7 84.2  MCH 27.2 27.0 27.3 26.9 27.5 27.2  MCHC 32.7 31.6 32.0 30.8 32.1 32.3  RDW 16.5*  16.5* 16.7* 17.2* 17.3* 16.9*  LYMPHSABS 1.8  --   --   --   --   --   MONOABS 2.9*  --   --   --   --   --   EOSABS 0.0  --   --   --   --   --   BASOSABS 0.0  --   --   --   --   --     Chemistries   Recent Labs Lab 09/29/12 1622 09/29/12 2236 09/30/12 0342 10/01/12 0535 10/02/12 0525 10/02/12 1531 10/03/12 0450  NA 123* 127* 129* 131* 131*  --  132*  K 5.3* 5.1 5.2* 5.0 5.5* 4.8 5.0  CL 87* 91* 96 97 100  --  98  CO2 24 25 24 21 22   --  24  GLUCOSE 163* 128* 96 81  194*  --  187*  BUN 46* 45* 42* 34* 24*  --  19  CREATININE 2.62* 2.37* 2.05* 1.34* 0.95  --  0.87  CALCIUM 7.9* 7.9* 7.2* 7.7* 8.3*  --  8.0*  MG  --   --  1.6  --   --   --   --   AST 12  --   --   --   --   --   --   ALT 7  --   --   --   --   --   --   ALKPHOS 67  --   --   --   --   --   --   BILITOT 0.2*  --   --   --   --   --   --    ------------------------------------------------------------------------------------------------------------------ estimated creatinine clearance is 89.1 ml/min (by C-G formula based on Cr of 0.87). ------------------------------------------------------------------------------------------------------------------ No results found for this basename: HGBA1C,  in the last 72 hours ------------------------------------------------------------------------------------------------------------------ No results found for this basename: CHOL, HDL, LDLCALC, TRIG, CHOLHDL, LDLDIRECT,  in the last 72 hours ------------------------------------------------------------------------------------------------------------------ No results found for this basename: TSH, T4TOTAL, FREET3, T3FREE, THYROIDAB,  in the last 72 hours ------------------------------------------------------------------------------------------------------------------ No results found for this basename: VITAMINB12, FOLATE, FERRITIN, TIBC, IRON, RETICCTPCT,  in the last 72 hours  Coagulation profile No results found for this  basename: INR, PROTIME,  in the last 168 hours  No results found for this basename: DDIMER,  in the last 72 hours  Cardiac Enzymes  Recent Labs Lab 09/29/12 2236  TROPONINI <0.30   ------------------------------------------------------------------------------------------------------------------ No components found with this basename: POCBNP,

## 2012-10-04 DIAGNOSIS — G8929 Other chronic pain: Secondary | ICD-10-CM

## 2012-10-04 DIAGNOSIS — I10 Essential (primary) hypertension: Secondary | ICD-10-CM

## 2012-10-04 MED ORDER — METRONIDAZOLE 500 MG PO TABS: 500.0000 mg | ORAL_TABLET | Freq: Three times a day (TID) | ORAL | Status: DC

## 2012-10-04 MED ORDER — PRO-STAT SUGAR FREE PO LIQD: 30.0000 mL | Freq: Three times a day (TID) | ORAL | Status: DC

## 2012-10-04 NOTE — Progress Notes (Signed)
10/04/2012 1120 NCM spoke to pt and explained [facility] is not open on weekend. States she will be will to go to another pharmacy for po vancomycin. Faxed Rx and Facesheet to [facility] and they will check her insurance to verify it is covered by insurance. Waiting confirmation from [facility] on po vancomycin. Isidoro Donning RN CCM Case Mgmt phone [phone]

## 2012-10-04 NOTE — Progress Notes (Signed)
   CARE MANAGEMENT NOTE 10/04/2012  Patient:  [name], [name]   Account Number:  [id number]  Date Initiated:  09/30/2012  Documentation initiated by:  [facility]  Subjective/Objective Assessment:   Sepsis - hypotensive.     Action/Plan:   Anticipated DC Date:  10/03/2012   Anticipated DC Plan:  HOME/SELF CARE      DC Planning Services  CM consult  Medication Assistance      Choice offered to / List presented to:             Status of service:  Completed, signed off Medicare Important Message given?   (If response is "NO", the following Medicare IM given date fields will be blank) Date Medicare IM given:   Date Additional Medicare IM given:    Discharge Disposition:  HOME/SELF CARE  Per UR Regulation:  Reviewed for med. necessity/level of care/duration of stay  If discussed at Long Length of Stay Meetings, dates discussed:    Comments:  10/04/2012 1400 NCM spoke to [facility] they will have Rx ready today for pickup. Copay is $1.20 per pharmacy. Provided address and phone number on dc instructions. Isidoro Donning RN CCM Case Mgmt phone [phone]  10/04/2012 1120 NCM spoke to pt and explained [facility] is not open on weekend. States she will be will to go to another pharmacy for po vancomycin. Faxed Rx and Facesheet to [facility] and they will check her insurance to verify it is covered by insurance. Waiting confirmation from [facility] on po vancomycin. Isidoro Donning RN CCM Case Mgmt phone [phone]   Contact:  Cleotis Nipper Daughter     [phone]                 Adylin, Hankey Spouse     [phone]  10-02-12 Referral for assistance with Vancomycin . Spoke with patient she states she never missed a dose of Vancomycin and has prescription coverage Ronny Flurry RN BSN [phone]

## 2012-10-04 NOTE — Progress Notes (Signed)
Discharge instructions gone over. Home medications gone over. Diet, activity, and signs and symptoms of infection gone over. Follow up appointment to be made. Prescriptions to be picked up at [facility] and vancomycin to be picked up at [facility]. Patient is not interested in my chart at this time. She verbalized understanding of instructions and knows to stop taking the levaquinn. Stated she didn't have any more anyway.

## 2012-10-04 NOTE — Discharge Summary (Addendum)
[facility]                                                                                   [name], is a 57 y.o. female  DOB June 23, 1955  MRN [id number].  Admission date:  09/29/2012  Discharge Date:  10/04/2012  Primary MD  [name], NP  Admitting Physician  Nyoka Cowden, MD  Admission Diagnosis  Acute renal failure [584.9] C. difficile diarrhea [008.45] Intravascular volume depletion [276.52] Acute respiratory failure with hypoxia [518.81] Severe sepsis with septic shock [038.9, 995.92, 785.52] Hypotension [458.9]  Discharge Diagnosis     Active Problems:   Obesity   Acute-on-chronic respiratory failure   Hypotension (resolved 12/27)   Diabetes mellitus type 2 in obese   Acute renal failure   Intravascular volume depletion    Past Medical History  Diagnosis Date  . COPD (chronic obstructive pulmonary disease)   . Respiratory failure   . Morbid obesity   . Tobacco abuse   . GERD (gastroesophageal reflux disease)   . Hyperlipidemia   . Chronic pain   . Hypertension   . Heart attack   . Arthritis   . Allergic rhinitis   . Diabetes mellitus, type 2   . Diabetes mellitus type 2 in obese 02/29/2012  . HTN (hypertension) 02/29/2012  . C. difficile diarrhea 09/2012  . Chronic kidney disease 09/2012    acute renal failure  . Shock 09/2012    Past Surgical History  Procedure Laterality Date  . Cholecystectomy    . Partial hysterectomy    . Angioplasty    . Umbilical hernia repair  12/2009     Recommendations for primary care physician for things to follow:   Follow CBC, BMP, weight, C. Difficile clinically on a close basis   Discharge Diagnoses:   Active Problems:   Obesity   Acute-on-chronic respiratory failure   Hypotension (resolved 12/27)   Diabetes mellitus type 2 in obese   Acute renal failure   Intravascular volume depletion    Discharge Condition: stable   Diet recommendation: See Discharge Instructions  below   Consults  ID over the phone    History of present illness and  Hospital Course:     Kindly see H&P for history of present illness and admission details, please review complete Labs, Consult reports and Test reports for all details in brief DONYA HITCH, is a 57 y.o. female, patient with history of Diabetes mellitus type 2, recent history of C. Difficile, chronic pain, obesity, hypertension, dyslipidemia, GERD, tobacco abuse. Counseled to quit smoking COPD.  And with above dictated history was recently admitted for C. Difficile colitis, she was subsequently discharged home about 2 weeks ago, however due to some misunderstanding with her outpatient pharmacy she could not get her vancomycin felt, she remained Home without vancomycin for 2 weeks and subsequently Worsening diarrhea due to C. Difficile colitis which in turn caused intravascular volume depletion, dehydration, acute renal failure,Hyperkalemia, He was kept in the hospital and treated with high dose of oral vancomycin and Flagyl along with IV fluids initially, she did remarkably better and gradually showed sustained improvement. I discussed her case over the  phone with ID physician Dr. Luciana Axe And he agreed with the present plan of slow long-term vancomycin taper along with Flagyl.  She'll feels about 50% better diarrhea is gradually improving, her leukocytosis is down from 30,000 to about 14,000 her diarrhea frequency is going down, she has no abdominal pain, minimal abdominal distention, she has in fact developed some third spacing of fluid due to protein calorie malnutrition from diarrhea and receiving IV fluids causing third spacing of fluids. She will be placed on oral routine supplement along with continuation of her home dose Lasix. Her renal function is now back to baseline. Will request primary care physician to please monitor her CBC, C. Difficile colitis, BMP and weight closely along with her edema.   For her  type 2  diabetes mellitus her A1c was 6.4 last in December of 2013, at this point her home regimen will be resumed as before with outpatient followup with PCP. 4 underlying history of CAD, hypertension, dyslipidemia and COPD she will resume her home medications unchanged.    She was seen by physical therapy and cleared for home discharge, I have requested case management to provide her with bedside commode to help her with frequent diarrhea.Is management has also been consulted to provide her with vancomycin supply prior to discharge.   Today   Subjective:   Lariyah Shetterly today has no headache,no chest abdominal pain,no new weakness tingling or numbness, feels much better wants to go home today. Diarrhea is better and improved about 50% per patient.  Objective:   Blood pressure 117/83, pulse 77, temperature 97.7 F (36.5 C), temperature source Oral, resp. rate 18, height 5\' 5"  (1.651 m), weight 107.775 kg (237 lb 9.6 oz), SpO2 97.00%.   Intake/Output Summary (Last 24 hours) at 10/04/12 0933 Last data filed at 10/03/12 1859  Gross per 24 hour  Intake    240 ml  Output      7 ml  Net    233 ml    Exam Awake Alert, Oriented *3, No new F.N deficits, Normal affect [place].AT,PERRAL Supple Neck,No JVD, No cervical lymphadenopathy appriciated.  Symmetrical Chest wall movement, Good air movement bilaterally, CTAB RRR,No Gallops,Rubs or new Murmurs, No Parasternal Heave +ve B.Sounds, Abd Soft, Non tender, No organomegaly appriciated, No rebound -guarding or rigidity. No Cyanosis, Clubbing or edema, No new Rash or bruise  Data Review   Major procedures and Radiology Reports - PLEASE review detailed and final reports for all details, in brief -     Dg Abd Portable 1v  10/01/2012   *RADIOLOGY REPORT*  Clinical Data: Abdominal pain history of C difficile colitis  PORTABLE ABDOMEN - 1 VIEW  Comparison: Abdominal radiograph 09/10/2012 and 05/03/2012  Findings: Cholecystectomy clips are noted.  The  bowel gas pattern is nonobstructive.  No significant stool burden is seen.  No abdominal mass effect.  Tubal ligation clips project over the pelvis.  The visualized lung bases are clear.  IMPRESSION: Nonobstructive bowel gas pattern.   Original Report Authenticated By: Britta Mccreedy, M.D.      Micro Results      Recent Results (from the past 240 hour(s))  CULTURE, BLOOD (ROUTINE X 2)     Status: None   Collection Time    09/29/12  7:22 PM      Result Value Range Status   Specimen Description BLOOD ARM RIGHT   Final   Special Requests BOTTLES DRAWN AEROBIC ONLY 3CC   Final   Culture  Setup Time 09/30/2012 00:34  Final   Culture     Final   Value:        BLOOD CULTURE RECEIVED NO GROWTH TO DATE CULTURE WILL BE HELD FOR 5 DAYS BEFORE ISSUING A FINAL NEGATIVE REPORT   Report Status PENDING   Incomplete  CULTURE, BLOOD (ROUTINE X 2)     Status: None   Collection Time    09/29/12  8:07 PM      Result Value Range Status   Specimen Description BLOOD HAND RIGHT   Final   Special Requests BOTTLES DRAWN AEROBIC ONLY 2CC   Final   Culture  Setup Time 09/30/2012 00:34   Final   Culture     Final   Value:        BLOOD CULTURE RECEIVED NO GROWTH TO DATE CULTURE WILL BE HELD FOR 5 DAYS BEFORE ISSUING A FINAL NEGATIVE REPORT   Report Status PENDING   Incomplete  MRSA PCR SCREENING     Status: None   Collection Time    09/29/12 10:17 PM      Result Value Range Status   MRSA by PCR NEGATIVE  NEGATIVE Final   Comment:            The GeneXpert MRSA Assay (FDA     approved for NASAL specimens     only), is one component of a     comprehensive MRSA colonization     surveillance program. It is not     intended to diagnose MRSA     infection nor to guide or     monitor treatment for     MRSA infections.     CBC w Diff: Lab Results  Component Value Date   WBC 14.7* 10/03/2012   HGB 13.1 10/03/2012   HCT 40.5 10/03/2012   PLT 607* 10/03/2012   LYMPHOPCT 5* 09/29/2012   MONOPCT 8 09/29/2012   EOSPCT  0 09/29/2012   BASOPCT 0 09/29/2012    CMP: Lab Results  Component Value Date   NA 132* 10/03/2012   K 5.0 10/03/2012   CL 98 10/03/2012   CO2 24 10/03/2012   BUN 19 10/03/2012   CREATININE 0.87 10/03/2012   PROT 4.7* 09/29/2012   ALBUMIN 1.7* 09/29/2012   BILITOT 0.2* 09/29/2012   ALKPHOS 67 09/29/2012   AST 12 09/29/2012   ALT 7 09/29/2012  .   Discharge Instructions     Follow with Primary MD [name], NP in 3 days   Get CBC, CMP, checked 3 days by Primary MD and again as instructed by your Primary MD.    Get Medicines reviewed and adjusted.  Please request your Prim.MD to go over all Hospital Tests and Procedure/Radiological results at the follow up, please get all Hospital records sent to your Prim MD by signing hospital release before you go home.  Activity: As tolerated with Full fall precautions use walker/cane & assistance as needed   Diet:  Heart healthy low carbohydrate   Check your Weight same time everyday, if you gain over 2 pounds, or you develop in leg swelling, experience more shortness of breath or chest pain, call your Primary MD immediately. Follow Cardiac Low Salt Diet and 1.8 lit/day fluid restriction.  Disposition Home    If you experience worsening of your admission symptoms, develop shortness of breath, life threatening emergency, suicidal or homicidal thoughts you must seek medical attention immediately by calling 911 or calling your MD immediately  if symptoms less severe.  You Must read complete instructions/literature  along with all the possible adverse reactions/side effects for all the Medicines you take and that have been prescribed to you. Take any new Medicines after you have completely understood and accpet all the possible adverse reactions/side effects.   Do not drive and provide baby sitting services if your were admitted for syncope or siezures until you have seen by Primary MD or a Neurologist and advised to do so again.  Do not drive when taking  Pain medications.    Do not take more than prescribed Pain, Sleep and Anxiety Medications  Special Instructions: If you have smoked or chewed Tobacco  in the last 2 yrs please stop smoking, stop any regular Alcohol  and or any Recreational drug use.  Wear Seat belts while driving.   Please note  You were cared for by a hospitalist during your hospital stay. If you have any questions about your discharge medications or the care you received while you were in the hospital after you are discharged, you can call the unit and asked to speak with the hospitalist on call if the hospitalist that took care of you is not available. Once you are discharged, your primary care physician will handle any further medical issues. Please note that NO REFILLS for any discharge medications will be authorized once you are discharged, as it is imperative that you return to your primary care physician (or establish a relationship with a primary care physician if you do not have one) for your aftercare needs so that they can reassess your need for medications and monitor your lab values.    Follow-up Information   Follow up with [facility], NP. Schedule an appointment as soon as possible for a visit in 3 days.   Contact information:   [address] [place] [address] [phone]         Discharge Medications     Medication List    STOP taking these medications       levofloxacin 500 MG tablet  Commonly known as:  LEVAQUIN      TAKE these medications       acetaminophen 325 MG tablet  Commonly known as:  TYLENOL  Take 650 mg by mouth every 6 (six) hours as needed for pain.     antiseptic oral rinse Liqd  15 mLs by Mouth Rinse route QID.     aspirin 81 MG chewable tablet  Chew 1 tablet (81 mg total) by mouth daily.     budesonide 0.25 MG/2ML nebulizer solution  Commonly known as:  PULMICORT  Take 2 mLs (0.25 mg total) by nebulization 2 (two) times daily.     diclofenac  sodium 1 % Gel  Commonly known as:  VOLTAREN  Apply 4 g topically 2 (two) times daily as needed (for foot pain).     famotidine 20 MG tablet  Commonly known as:  PEPCID  Take 1 tablet (20 mg total) by mouth at bedtime.     feeding supplement Liqd  Take 30 mLs by mouth 3 (three) times daily with meals.     furosemide 40 MG tablet  Commonly known as:  LASIX  Take 1 tablet (40 mg total) by mouth 2 (two) times daily.     gabapentin 100 MG capsule  Commonly known as:  NEURONTIN  Take 1 capsule (100 mg total) by mouth 3 (three) times daily.     glimepiride 2 MG tablet  Commonly known as:  AMARYL  Take 2 mg by mouth 2 (two)  times daily with a meal.     HYDROcodone-acetaminophen 7.5-325 MG per tablet  Commonly known as:  NORCO  Take 1 tablet by mouth every 4 (four) hours as needed for pain.     insulin aspart 100 UNIT/ML injection  Commonly known as:  novoLOG  - Inject 0-8 Units into the skin 3 (three) times daily with meals. On sliding scale:  -  0-100 0 units  - 101-150 take 3 units  - 151-200 take 5 units  - 201>300 take 8 units  - 400 or higher call MD     insulin glargine 100 UNIT/ML injection  Commonly known as:  LANTUS  Inject 32 Units into the skin at bedtime.     ipratropium-albuterol 0.5-2.5 (3) MG/3ML Soln  Commonly known as:  DUONEB  Take 3 mLs by nebulization 3 (three) times daily.     isosorbide mononitrate 15 mg Tb24  Commonly known as:  IMDUR  Take 0.5 tablets (15 mg total) by mouth daily.     metroNIDAZOLE 500 MG tablet  Commonly known as:  FLAGYL  Take 1 tablet (500 mg total) by mouth every 8 (eight) hours.     rOPINIRole 1 MG tablet  Commonly known as:  REQUIP  Take 1 mg by mouth at bedtime.     sertraline 50 MG tablet  Commonly known as:  ZOLOFT  Take 50 mg by mouth daily.     vancomycin 50 mg/mL oral solution  Commonly known as:  VANCOCIN  500 mg PO q6hrs for 1 week, then 250mg  PO q6hrs for 1 week, then 125mg  PO q6hrs for 1 week, then  125mg  PO q12hrs 1 week and stop. Dispense full supply.     zolpidem 5 MG tablet  Commonly known as:  AMBIEN  Take 5 mg by mouth at bedtime as needed for sleep.           Total Time in preparing paper work, data evaluation and todays exam - 35 minutes  Leroy Sea M.D on 10/04/2012 at 9:33 AM  [facility] Office  [phone]

## 2012-10-06 LAB — CULTURE, BLOOD (ROUTINE X 2): Culture: NO GROWTH

## 2012-10-08 ENCOUNTER — Telehealth (HOSPITAL_COMMUNITY): Payer: Self-pay | Admitting: *Deleted

## 2012-10-08 NOTE — Telephone Encounter (Signed)
This is the final attempt to enroll patient in the [facility].  She does not have an answering machine and I was unable to leave a message.  Financial assistance forms were mailed 06/30/2012.  No response regarding the assistance forms.

## 2012-10-09 ENCOUNTER — Inpatient Hospital Stay (HOSPITAL_COMMUNITY)
Admission: EM | Admit: 2012-10-09 | Discharge: 2012-10-30 | DRG: 981 | Disposition: A | Discharge status: Skilled Nursing Facility | Payer: Medicare HMO | Attending: Pulmonary Disease | Admitting: Pulmonary Disease

## 2012-10-09 ENCOUNTER — Emergency Department (HOSPITAL_COMMUNITY): Payer: Medicare HMO

## 2012-10-09 ENCOUNTER — Encounter (HOSPITAL_COMMUNITY): Payer: Self-pay | Admitting: Emergency Medicine

## 2012-10-09 DIAGNOSIS — I469 Cardiac arrest, cause unspecified: Secondary | ICD-10-CM

## 2012-10-09 DIAGNOSIS — G4733 Obstructive sleep apnea (adult) (pediatric): Secondary | ICD-10-CM | POA: Diagnosis present

## 2012-10-09 DIAGNOSIS — I442 Atrioventricular block, complete: Secondary | ICD-10-CM | POA: Diagnosis not present

## 2012-10-09 DIAGNOSIS — K219 Gastro-esophageal reflux disease without esophagitis: Secondary | ICD-10-CM

## 2012-10-09 DIAGNOSIS — J449 Chronic obstructive pulmonary disease, unspecified: Secondary | ICD-10-CM

## 2012-10-09 DIAGNOSIS — A0472 Enterocolitis due to Clostridium difficile, not specified as recurrent: Principal | ICD-10-CM | POA: Diagnosis present

## 2012-10-09 DIAGNOSIS — I495 Sick sinus syndrome: Secondary | ICD-10-CM | POA: Diagnosis present

## 2012-10-09 DIAGNOSIS — I480 Paroxysmal atrial fibrillation: Secondary | ICD-10-CM | POA: Diagnosis present

## 2012-10-09 DIAGNOSIS — G9340 Encephalopathy, unspecified: Secondary | ICD-10-CM | POA: Diagnosis not present

## 2012-10-09 DIAGNOSIS — E785 Hyperlipidemia, unspecified: Secondary | ICD-10-CM

## 2012-10-09 DIAGNOSIS — E870 Hyperosmolality and hypernatremia: Secondary | ICD-10-CM | POA: Diagnosis not present

## 2012-10-09 DIAGNOSIS — Z91199 Patient's noncompliance with other medical treatment and regimen due to unspecified reason: Secondary | ICD-10-CM

## 2012-10-09 DIAGNOSIS — J9620 Acute and chronic respiratory failure, unspecified whether with hypoxia or hypercapnia: Secondary | ICD-10-CM | POA: Diagnosis present

## 2012-10-09 DIAGNOSIS — J90 Pleural effusion, not elsewhere classified: Secondary | ICD-10-CM

## 2012-10-09 DIAGNOSIS — E873 Alkalosis: Secondary | ICD-10-CM | POA: Diagnosis not present

## 2012-10-09 DIAGNOSIS — Z87891 Personal history of nicotine dependence: Secondary | ICD-10-CM

## 2012-10-09 DIAGNOSIS — R571 Hypovolemic shock: Secondary | ICD-10-CM

## 2012-10-09 DIAGNOSIS — I4892 Unspecified atrial flutter: Secondary | ICD-10-CM

## 2012-10-09 DIAGNOSIS — I10 Essential (primary) hypertension: Secondary | ICD-10-CM

## 2012-10-09 DIAGNOSIS — E662 Morbid (severe) obesity with alveolar hypoventilation: Secondary | ICD-10-CM | POA: Diagnosis present

## 2012-10-09 DIAGNOSIS — Z794 Long term (current) use of insulin: Secondary | ICD-10-CM

## 2012-10-09 DIAGNOSIS — J9612 Chronic respiratory failure with hypercapnia: Secondary | ICD-10-CM

## 2012-10-09 DIAGNOSIS — J4489 Other specified chronic obstructive pulmonary disease: Secondary | ICD-10-CM | POA: Diagnosis present

## 2012-10-09 DIAGNOSIS — E669 Obesity, unspecified: Secondary | ICD-10-CM

## 2012-10-09 DIAGNOSIS — M549 Dorsalgia, unspecified: Secondary | ICD-10-CM | POA: Diagnosis present

## 2012-10-09 DIAGNOSIS — E1169 Type 2 diabetes mellitus with other specified complication: Secondary | ICD-10-CM

## 2012-10-09 DIAGNOSIS — R578 Other shock: Secondary | ICD-10-CM | POA: Diagnosis present

## 2012-10-09 DIAGNOSIS — G8929 Other chronic pain: Secondary | ICD-10-CM

## 2012-10-09 DIAGNOSIS — R601 Generalized edema: Secondary | ICD-10-CM | POA: Diagnosis not present

## 2012-10-09 DIAGNOSIS — E872 Acidosis, unspecified: Secondary | ICD-10-CM

## 2012-10-09 DIAGNOSIS — Z9981 Dependence on supplemental oxygen: Secondary | ICD-10-CM

## 2012-10-09 DIAGNOSIS — E119 Type 2 diabetes mellitus without complications: Secondary | ICD-10-CM | POA: Diagnosis present

## 2012-10-09 DIAGNOSIS — G894 Chronic pain syndrome: Secondary | ICD-10-CM | POA: Diagnosis present

## 2012-10-09 DIAGNOSIS — G2581 Restless legs syndrome: Secondary | ICD-10-CM | POA: Diagnosis present

## 2012-10-09 DIAGNOSIS — F411 Generalized anxiety disorder: Secondary | ICD-10-CM | POA: Diagnosis present

## 2012-10-09 DIAGNOSIS — Z9119 Patient's noncompliance with other medical treatment and regimen: Secondary | ICD-10-CM

## 2012-10-09 DIAGNOSIS — I4891 Unspecified atrial fibrillation: Secondary | ICD-10-CM

## 2012-10-09 DIAGNOSIS — A0471 Enterocolitis due to Clostridium difficile, recurrent: Secondary | ICD-10-CM

## 2012-10-09 DIAGNOSIS — N189 Chronic kidney disease, unspecified: Secondary | ICD-10-CM | POA: Diagnosis present

## 2012-10-09 DIAGNOSIS — J9601 Acute respiratory failure with hypoxia: Secondary | ICD-10-CM

## 2012-10-09 DIAGNOSIS — E874 Mixed disorder of acid-base balance: Secondary | ICD-10-CM | POA: Diagnosis present

## 2012-10-09 DIAGNOSIS — E876 Hypokalemia: Secondary | ICD-10-CM

## 2012-10-09 DIAGNOSIS — I252 Old myocardial infarction: Secondary | ICD-10-CM

## 2012-10-09 DIAGNOSIS — I129 Hypertensive chronic kidney disease with stage 1 through stage 4 chronic kidney disease, or unspecified chronic kidney disease: Secondary | ICD-10-CM | POA: Diagnosis present

## 2012-10-09 DIAGNOSIS — R0689 Other abnormalities of breathing: Secondary | ICD-10-CM

## 2012-10-09 DIAGNOSIS — D649 Anemia, unspecified: Secondary | ICD-10-CM | POA: Diagnosis present

## 2012-10-09 DIAGNOSIS — M129 Arthropathy, unspecified: Secondary | ICD-10-CM | POA: Diagnosis present

## 2012-10-09 DIAGNOSIS — Z6841 Body Mass Index (BMI) 40.0 and over, adult: Secondary | ICD-10-CM

## 2012-10-09 DIAGNOSIS — I452 Bifascicular block: Secondary | ICD-10-CM | POA: Diagnosis present

## 2012-10-09 DIAGNOSIS — F05 Delirium due to known physiological condition: Secondary | ICD-10-CM | POA: Diagnosis not present

## 2012-10-09 DIAGNOSIS — T40605A Adverse effect of unspecified narcotics, initial encounter: Secondary | ICD-10-CM | POA: Diagnosis present

## 2012-10-09 DIAGNOSIS — N179 Acute kidney failure, unspecified: Secondary | ICD-10-CM

## 2012-10-09 DIAGNOSIS — E8779 Other fluid overload: Secondary | ICD-10-CM | POA: Diagnosis not present

## 2012-10-09 HISTORY — DX: Chronic respiratory failure, unspecified whether with hypoxia or hypercapnia: J96.10

## 2012-10-09 HISTORY — DX: Angina pectoris with documented spasm: I20.1

## 2012-10-09 LAB — URINE MICROSCOPIC-ADD ON

## 2012-10-09 LAB — URINALYSIS, ROUTINE W REFLEX MICROSCOPIC
Hgb urine dipstick: NEGATIVE
Ketones, ur: 15 mg/dL — AB
Nitrite: POSITIVE — AB
Protein, ur: NEGATIVE mg/dL
Urobilinogen, UA: 1 mg/dL (ref 0.0–1.0)

## 2012-10-09 LAB — CBC WITH DIFFERENTIAL/PLATELET
Basophils Absolute: 0 10*3/uL (ref 0.0–0.1)
Basophils Relative: 0 % (ref 0–1)
Eosinophils Absolute: 0 10*3/uL (ref 0.0–0.7)
HCT: 23 % — ABNORMAL LOW (ref 36.0–46.0)
Hemoglobin: 7.4 g/dL — ABNORMAL LOW (ref 12.0–15.0)
MCH: 27.9 pg (ref 26.0–34.0)
MCHC: 32.2 g/dL (ref 30.0–36.0)
Monocytes Absolute: 1 10*3/uL (ref 0.1–1.0)
Monocytes Relative: 11 % (ref 3–12)
Neutro Abs: 7.4 10*3/uL (ref 1.7–7.7)
Neutrophils Relative %: 80 % — ABNORMAL HIGH (ref 43–77)
RDW: 18.4 % — ABNORMAL HIGH (ref 11.5–15.5)

## 2012-10-09 LAB — COMPREHENSIVE METABOLIC PANEL
ALT: 9 U/L (ref 0–35)
AST: 14 U/L (ref 0–37)
Alkaline Phosphatase: 34 U/L — ABNORMAL LOW (ref 39–117)
CO2: 21 mEq/L (ref 19–32)
Calcium: 6.2 mg/dL — CL (ref 8.4–10.5)
GFR calc Af Amer: 38 mL/min — ABNORMAL LOW (ref >90)
GFR calc non Af Amer: 33 mL/min — ABNORMAL LOW (ref >90)
Glucose, Bld: 166 mg/dL — ABNORMAL HIGH (ref 70–99)
Potassium: 3.3 mEq/L — ABNORMAL LOW (ref 3.5–5.1)
Sodium: 133 mEq/L — ABNORMAL LOW (ref 135–145)

## 2012-10-09 LAB — POCT I-STAT 3, VENOUS BLOOD GAS (G3P V)
Acid-base deficit: 4 mmol/L — ABNORMAL HIGH (ref 0.0–2.0)
pCO2, Ven: 62.6 mmHg — ABNORMAL HIGH (ref 45.0–50.0)
pH, Ven: 7.198 — CL (ref 7.250–7.300)
pO2, Ven: 39 mmHg (ref 30.0–45.0)

## 2012-10-09 LAB — LACTIC ACID, PLASMA: Lactic Acid, Venous: 1 mmol/L (ref 0.5–2.2)

## 2012-10-09 LAB — POCT I-STAT TROPONIN I: Troponin i, poc: 0.01 ng/mL (ref 0.00–0.08)

## 2012-10-09 LAB — BASIC METABOLIC PANEL
BUN: 49 mg/dL — ABNORMAL HIGH (ref 6–23)
GFR calc Af Amer: 26 mL/min — ABNORMAL LOW (ref >90)
GFR calc non Af Amer: 22 mL/min — ABNORMAL LOW (ref >90)
Potassium: 4.7 mEq/L (ref 3.5–5.1)

## 2012-10-09 LAB — POCT I-STAT 3, ART BLOOD GAS (G3+)
O2 Saturation: 93 %
TCO2: 26 mmol/L (ref 0–100)
pCO2 arterial: 66.3 mmHg (ref 35.0–45.0)
pO2, Arterial: 88 mmHg (ref 80.0–100.0)

## 2012-10-09 LAB — TYPE AND SCREEN
ABO/RH(D): A POS
Antibody Screen: NEGATIVE

## 2012-10-09 LAB — VALPROIC ACID LEVEL: Valproic Acid Lvl: <10 ug/mL — ABNORMAL LOW (ref 50.0–100.0)

## 2012-10-09 MED ORDER — HEPARIN SODIUM (PORCINE) 5000 UNIT/ML IJ SOLN
5000.0000 [IU] | Freq: Three times a day (TID) | INTRAMUSCULAR | Status: DC
  Administered 2012-10-10 – 2012-10-14 (×13): 5000 [IU] via SUBCUTANEOUS
  Filled 2012-10-09 (×16): qty 1

## 2012-10-09 MED ORDER — IPRATROPIUM BROMIDE 0.02 % IN SOLN: 0.5000 mg | RESPIRATORY_TRACT | Status: DC

## 2012-10-09 MED ORDER — ALBUTEROL (5 MG/ML) CONTINUOUS INHALATION SOLN
10.0000 mg/h | INHALATION_SOLUTION | RESPIRATORY_TRACT | Status: DC
  Administered 2012-10-09: 10 mg/h via RESPIRATORY_TRACT
  Filled 2012-10-09: qty 20

## 2012-10-09 MED ORDER — VANCOMYCIN 50 MG/ML ORAL SOLUTION
500.0000 mg | Freq: Four times a day (QID) | ORAL | Status: DC
  Administered 2012-10-10: 500 mg via ORAL
  Filled 2012-10-09 (×2): qty 10

## 2012-10-09 MED ORDER — NOREPINEPHRINE BITARTRATE 1 MG/ML IJ SOLN
2.0000 ug/min | INTRAVENOUS | Status: DC
  Administered 2012-10-09: 5 ug/min via INTRAVENOUS
  Administered 2012-10-10 (×2): 17 ug/min via INTRAVENOUS
  Filled 2012-10-09 (×3): qty 4

## 2012-10-09 MED ORDER — INSULIN ASPART 100 UNIT/ML ~~LOC~~ SOLN
0.0000 [IU] | SUBCUTANEOUS | Status: DC
  Administered 2012-10-10 (×4): 2 [IU] via SUBCUTANEOUS
  Administered 2012-10-11 (×3): 3 [IU] via SUBCUTANEOUS
  Administered 2012-10-11 – 2012-10-12 (×5): 2 [IU] via SUBCUTANEOUS
  Administered 2012-10-12 (×2): 3 [IU] via SUBCUTANEOUS
  Administered 2012-10-13 (×5): 2 [IU] via SUBCUTANEOUS
  Administered 2012-10-13: 3 [IU] via SUBCUTANEOUS
  Administered 2012-10-14 (×4): 2 [IU] via SUBCUTANEOUS

## 2012-10-09 MED ORDER — SODIUM CHLORIDE 0.9 % IV BOLUS (SEPSIS)
1000.0000 mL | Freq: Once | INTRAVENOUS | Status: AC
  Administered 2012-10-09: 1000 mL via INTRAVENOUS

## 2012-10-09 MED ORDER — CALCIUM GLUCONATE 10 % IV SOLN
1.0000 g | Freq: Once | INTRAVENOUS | Status: DC
  Filled 2012-10-09: qty 10

## 2012-10-09 MED ORDER — ALBUTEROL SULFATE (5 MG/ML) 0.5% IN NEBU
2.5000 mg | INHALATION_SOLUTION | Freq: Once | RESPIRATORY_TRACT | Status: DC
  Filled 2012-10-09: qty 0.5

## 2012-10-09 MED ORDER — ALBUTEROL SULFATE HFA 108 (90 BASE) MCG/ACT IN AERS: 6.0000 | INHALATION_SPRAY | RESPIRATORY_TRACT | Status: DC | PRN

## 2012-10-09 MED ORDER — METRONIDAZOLE IN NACL 5-0.79 MG/ML-% IV SOLN
500.0000 mg | Freq: Once | INTRAVENOUS | Status: AC
  Administered 2012-10-09: 500 mg via INTRAVENOUS
  Filled 2012-10-09: qty 100

## 2012-10-09 MED ORDER — METRONIDAZOLE 500 MG PO TABS
500.0000 mg | ORAL_TABLET | Freq: Three times a day (TID) | ORAL | Status: DC
  Administered 2012-10-10: 500 mg via ORAL
  Filled 2012-10-09 (×3): qty 1

## 2012-10-09 MED ORDER — ALBUTEROL SULFATE (5 MG/ML) 0.5% IN NEBU: 2.5000 mg | INHALATION_SOLUTION | RESPIRATORY_TRACT | Status: DC

## 2012-10-09 MED ORDER — IPRATROPIUM BROMIDE 0.02 % IN SOLN
0.5000 mg | Freq: Once | RESPIRATORY_TRACT | Status: AC
  Administered 2012-10-09: 0.5 mg via RESPIRATORY_TRACT
  Filled 2012-10-09: qty 2.5

## 2012-10-09 MED ORDER — SODIUM CHLORIDE 0.9 % IV SOLN
INTRAVENOUS | Status: DC
  Administered 2012-10-10 – 2012-10-11 (×3): via INTRAVENOUS
  Administered 2012-10-12: 1000 mL via INTRAVENOUS
  Administered 2012-10-13: 01:00:00 via INTRAVENOUS

## 2012-10-09 MED ORDER — BUDESONIDE 0.25 MG/2ML IN SUSP
0.2500 mg | Freq: Two times a day (BID) | RESPIRATORY_TRACT | Status: DC
  Administered 2012-10-10 – 2012-10-19 (×16): 0.25 mg via RESPIRATORY_TRACT
  Filled 2012-10-09 (×24): qty 2

## 2012-10-09 MED ORDER — POTASSIUM CHLORIDE 10 MEQ/50ML IV SOLN
10.0000 meq | INTRAVENOUS | Status: DC
  Filled 2012-10-09 (×4): qty 50

## 2012-10-09 NOTE — ED Notes (Signed)
Pt becoming less responsive.  Will respond to verbal stimuli. 2+ pitting edema to bil lower legs.  Nail beds cyanotic.  ABG's drawn.  Dr. Lew Dawes at bedside.

## 2012-10-09 NOTE — ED Notes (Signed)
[name] (pt's daughter)  [phone] Phone number Code for information  ([place]).

## 2012-10-09 NOTE — Consult Note (Signed)
PULMONARY  / CRITICAL CARE MEDICINE  Name: [name] MRN: [id number] DOB: 1955/08/31    ADMISSION DATE:  10/09/2012 CONSULTATION DATE:  10/09/2012  REFERRING MD :  Vivia Ewing MD PRIMARY SERVICE: PCCM  CHIEF COMPLAINT:  Diarrhea  BRIEF PATIENT DESCRIPTION: 57 y/o female with a history of recurrent C.diff returned to the [facility] ED on 8/7 with confusion, diarrhea, subjective fevers, chills, and abdominal pain.  Found to be hypotensive with AKI.  BP improved with IVF in ED, PCCM asked to evaluate for admission.  SIGNIFICANT EVENTS / STUDIES:  8/7 CXR > CVL in place, lungs clear 8/7 KUB > no obstruction, thick haustra consistent with colitis  LINES / TUBES: 8/7 R IJ CVL >>  CULTURES: 8/7 blood >> 8/7 urine >>  ANTIBIOTICS: 8/7 flagyl > 8/7 vanc >  HISTORY OF PRESENT ILLNESS:  57 y/o female with a history of recurrent C.diff returned to the [facility] ED on 8/7 with confusion, diarrhea, subjective fevers, chills, and abdominal pain.  Found to be hypotensive with AKI.  BP improved with IVF in ED, PCCM asked to evaluate for admission. She has some mild confusion in the ED, but agrees with the above symptoms.  She is unable to provide a more thorough history. Notably, on 7/28 she was admitted with identical symptoms and was discharged on 8/2 with instructions to take oral vanc and flagyl.  It is unclear to me if she took these.  She has a history of noncompliance in the past.  PAST MEDICAL HISTORY :  Past Medical History  Diagnosis Date  . COPD (chronic obstructive pulmonary disease)   . Respiratory failure   . Morbid obesity   . Tobacco abuse   . GERD (gastroesophageal reflux disease)   . Hyperlipidemia   . Chronic pain   . Hypertension   . Heart attack   . Arthritis   . Allergic rhinitis   . Diabetes mellitus, type 2   . Diabetes mellitus type 2 in obese 02/29/2012  . HTN (hypertension) 02/29/2012  . C. difficile diarrhea 09/2012  . Chronic kidney disease 09/2012    acute renal  failure  . Shock 09/2012   Past Surgical History  Procedure Laterality Date  . Cholecystectomy    . Partial hysterectomy    . Angioplasty    . Umbilical hernia repair  12/2009   Prior to Admission medications   Medication Sig Start Date End Date Taking? Authorizing Provider  aspirin 81 MG chewable tablet Chew 1 tablet (81 mg total) by mouth daily. 03/12/12  Yes Annett Gula, MD  budesonide (PULMICORT) 0.25 MG/2ML nebulizer solution Take 2 mLs (0.25 mg total) by nebulization 2 (two) times daily. 09/16/12  Yes Vassie Loll, MD  diclofenac sodium (VOLTAREN) 1 % GEL Apply 4 g topically 2 (two) times daily as needed (for foot pain).    Yes Historical Provider, MD  famotidine (PEPCID) 20 MG tablet Take 1 tablet (20 mg total) by mouth at bedtime. 09/16/12  Yes Vassie Loll, MD  furosemide (LASIX) 40 MG tablet Take 1 tablet (40 mg total) by mouth 2 (two) times daily. 09/16/12  Yes Vassie Loll, MD  gabapentin (NEURONTIN) 100 MG capsule Take 1 capsule (100 mg total) by mouth 3 (three) times daily. 04/21/12  Yes Tammy S Parrett, NP  glimepiride (AMARYL) 2 MG tablet Take 2 mg by mouth 2 (two) times daily with a meal.   Yes Historical Provider, MD  HYDROcodone-acetaminophen (NORCO) 7.5-325 MG per tablet Take 1 tablet by mouth every  4 (four) hours as needed for pain.   Yes Historical Provider, MD  insulin aspart (NOVOLOG) 100 UNIT/ML injection Inject 0-8 Units into the skin 3 (three) times daily with meals. On sliding scale:  0-100 0 units 101-150 take 3 units 151-200 take 5 units 201>300 take 8 units 400 or higher call MD   Yes Historical Provider, MD  insulin glargine (LANTUS) 100 UNIT/ML injection Inject 32 Units into the skin at bedtime. 03/12/12  Yes Annett Gula, MD  ipratropium-albuterol (DUONEB) 0.5-2.5 (3) MG/3ML SOLN Take 3 mLs by nebulization 3 (three) times daily.   Yes Historical Provider, MD  isosorbide mononitrate (IMDUR) 15 mg TB24 Take 0.5 tablets (15 mg total) by mouth daily. 09/17/12   Yes Vassie Loll, MD  metroNIDAZOLE (FLAGYL) 500 MG tablet Take 1 tablet (500 mg total) by mouth every 8 (eight) hours. 10/04/12  Yes Leroy Sea, MD  rOPINIRole (REQUIP) 1 MG tablet Take 1 mg by mouth at bedtime.   Yes Historical Provider, MD  sertraline (ZOLOFT) 50 MG tablet Take 50 mg by mouth daily.   Yes Historical Provider, MD  vancomycin (VANCOCIN) 50 mg/mL oral solution 500 mg PO q6hrs for 1 week, then 250mg  PO q6hrs for 1 week, then 125mg  PO q6hrs for 1 week, then 125mg  PO q12hrs 1 week and stop. Dispense full supply. 10/02/12  Yes Leroy Sea, MD  acetaminophen (TYLENOL) 325 MG tablet Take 650 mg by mouth every 6 (six) hours as needed for pain.    Historical Provider, MD   Allergies  Allergen Reactions  . Ambien (Zolpidem Tartrate) Other (See Comments)    Severe hallucinations and behavior changes reported by family  . Cardizem (Diltiazem Hcl) Swelling  . Lunesta (Eszopiclone) Other (See Comments)    Nightmares & hallucinations  . Metformin And Related Swelling  . Protonix (Pantoprazole Sodium) Other (See Comments)    Unknown reaction-"Doctor told me to never take it"    FAMILY HISTORY:  History reviewed. No pertinent family history. SOCIAL HISTORY:  reports that she quit smoking about 7 months ago. Her smoking use included Cigarettes. She has a 40 pack-year smoking history. She has never used smokeless tobacco. She reports that she does not drink alcohol or use illicit drugs.  REVIEW OF SYSTEMS:  Cannot obtain due to confusion  SUBJECTIVE:   VITAL SIGNS: Temp:  [97.8 F (36.6 C)] 97.8 F (36.6 C) (08/07 1453) Pulse Rate:  [34-95] 34 (08/07 1915) Resp:  [10-17] 12 (08/07 2030) BP: (48-102)/(17-76) 99/49 mmHg (08/07 2030) SpO2:  [68 %-95 %] 68 % (08/07 1915) HEMODYNAMICS:   VENTILATOR SETTINGS:   INTAKE / OUTPUT: Intake/Output     08/07 0701 - 08/08 0700   I.V. 2100   Total Intake 2100   Net +2100         PHYSICAL EXAMINATION:  Gen: chronically ill  appearing, obese HEENT: NCAT, PERRL, EOMi, OP clear, PULM: CTA B, few scattered upper airway rhonchi that clear CV: Tachy, distant heart sounds, cannot assess JVD AB: BS+, soft, nontender, no hsm Ext: warm, trace edema, no clubbing, no cyanosis Derm: no rash or skin breakdown Neuro: Somnolent but arouses easily, oriented to [facility], [facility], answers some questions and follows commands but inattentive, and disorganized thinking, maew   LABS:  CBC Recent Labs     10/09/12  1540  10/09/12  1915  WBC  9.3  10.5  HGB  7.4*  11.5*  HCT  23.0*  36.3  PLT  385  491*  Coag's No results found for this basename: APTT, INR,  in the last 72 hours BMET Recent Labs     10/09/12  1540  NA  133*  K  3.3*  CL  103  CO2  21  BUN  38*  CREATININE  1.66*  GLUCOSE  166*   Electrolytes Recent Labs     10/09/12  1540  CALCIUM  6.2*   Sepsis Markers No results found for this basename: LACTICACIDVEN, PROCALCITON, O2SATVEN,  in the last 72 hours ABG No results found for this basename: PHART, PCO2ART, PO2ART,  in the last 72 hours Liver Enzymes Recent Labs     10/09/12  1540  AST  14  ALT  9  ALKPHOS  34*  BILITOT  0.2*  ALBUMIN  1.3*   Cardiac Enzymes No results found for this basename: TROPONINI, PROBNP,  in the last 72 hours Glucose No results found for this basename: GLUCAP,  in the last 72 hours    ASSESSMENT / PLAN:  57 y/o female with recurrent C.Diff who returns to the [facility] ED with AKI and hypovolemic shock from the same.  Her BP has normalized with saline and her lactic acid is normal which is reassuring.  Notably, on 7/28 she was admitted with similar symptoms (delirium, diarrhea) and had worse labwork and was managed successfully with IVF alone.  No other clear explanation for her symptoms this time around other than recurrent c.diff.  PULMONARY A: COPD, not in acute exacerbation P:   -O2 as needed -continue home pulmicort and duoneb -consider symbicort or  other combination inhaler (LABA/ICS) at discharge -prn albuterol  CARDIOVASCULAR A: Hypovolemic shock > improved with IVF; normal lactic acid reassuring P:  -continue IVF  RENAL A:  AKI due to hypovolemia P:   -continue IVF  GASTROINTESTINAL A:  Recurrent C.diff P:   -see ID  HEMATOLOGIC A:  No acute issues P:  -monitor for bleeding  INFECTIOUS A:  Recurrent C.diff;  P:   -restart Vanc/Flagyl per ID recs as noted in 8/2 D/C summary -would reconsider Rifaximin if she was compliant with meds at home  ENDOCRINE A:  DM2 P:   -SSI/monitor glucose  NEUROLOGIC A:  Delirium due to infection/AKI/hypovolemia; similar description to previous admissions P:   -no sedating meds -frequent orientation  Dispo: will defer to [facility] to admit, would recommend step down  We will sign off, call if questions  Fonnie Jarvis Pulmonary and Critical Care Medicine [facility] Pager: [phone]  10/09/2012, 9:29 PM

## 2012-10-09 NOTE — Progress Notes (Signed)
Critical arterial blood gas results discussed with Dr. Rhunette Croft and Dr. Lew Dawes. Venous results were discussed with Dr. Lew Dawes. Patient placed on Bipap

## 2012-10-09 NOTE — ED Notes (Addendum)
PT reports that she has extensive hx of C Diff with explosive diarr today. Was just in hospital 2-3 days ago for this. Pt reports she is too sick to go infectious disease MD appt. PT was initially hyptensive in 80's and orthostatic. EMS gave her 150 NS and zofran. Lung sounds are junky per EMS; hx of vent from H1N1

## 2012-10-09 NOTE — ED Notes (Signed)
Pt being placed on bipap at this time

## 2012-10-09 NOTE — ED Notes (Signed)
IV access attempted x's 2 without success.  Dr. Lew Dawes made aware.

## 2012-10-09 NOTE — ED Notes (Signed)
One yellow colored earring removed and placed in specimen bottle with name label attached.

## 2012-10-09 NOTE — ED Notes (Signed)
Pt continues to respond to verbal stimuli at this time.

## 2012-10-09 NOTE — ED Notes (Signed)
Blood cultures drawn prior to Flagyl being started.  Pt with agonal resp. Which daughter st's is normal for pt when she is sleeping.  Dr. Lew Dawes made aware and at bedside.  Pt remains alert and oriented x's 3.

## 2012-10-09 NOTE — ED Provider Notes (Signed)
CSN: [id number]     Arrival date & time 10/09/12  1449 History     First MD Initiated Contact with Patient 10/09/12 1502     Chief Complaint  Patient presents with  . Diarrhea   (Consider location/radiation/quality/duration/timing/severity/associated sxs/prior Treatment) Patient is a 57 y.o. female presenting with diarrhea. The history is provided by the patient and a relative.  Diarrhea Quality:  Malodorous and watery Severity:  Severe Onset quality:  Gradual Timing:  Intermittent Progression:  Unchanged Relieved by:  Nothing Worsened by:  Nothing tried Ineffective treatments: antibiotics. Associated symptoms: abdominal pain and chills   Associated symptoms: no diaphoresis, no fever, no headaches and no vomiting     Past Medical History  Diagnosis Date  . COPD (chronic obstructive pulmonary disease)   . Respiratory failure   . Morbid obesity   . Tobacco abuse   . GERD (gastroesophageal reflux disease)   . Hyperlipidemia   . Chronic pain   . Hypertension   . Heart attack   . Arthritis   . Allergic rhinitis   . Diabetes mellitus, type 2   . Diabetes mellitus type 2 in obese 02/29/2012  . HTN (hypertension) 02/29/2012  . C. difficile diarrhea 09/2012  . Chronic kidney disease 09/2012    acute renal failure  . Shock 09/2012   Past Surgical History  Procedure Laterality Date  . Cholecystectomy    . Partial hysterectomy    . Angioplasty    . Umbilical hernia repair  12/2009   History reviewed. No pertinent family history. History  Substance Use Topics  . Smoking status: Former Smoker -- 1.00 packs/day for 40 years    Types: Cigarettes    Quit date: 03/04/2012  . Smokeless tobacco: Never Used  . Alcohol Use: No   OB History   Grav Para Term Preterm Abortions TAB SAB Ect Mult Living                 Review of Systems  Constitutional: Positive for chills, appetite change and fatigue. Negative for fever and diaphoresis.  HENT: Negative for ear pain, congestion,  sore throat, facial swelling, mouth sores, trouble swallowing, neck pain and neck stiffness.   Eyes: Negative.   Respiratory: Negative for apnea, cough, chest tightness, shortness of breath and wheezing.   Cardiovascular: Negative for chest pain, palpitations and leg swelling.  Gastrointestinal: Positive for abdominal pain and diarrhea. Negative for nausea, vomiting and abdominal distention.  Genitourinary: Negative for hematuria, flank pain, vaginal discharge, difficulty urinating and menstrual problem.  Musculoskeletal: Negative for back pain and gait problem.  Skin: Negative for rash and wound.  Neurological: Positive for weakness. Negative for dizziness, tremors, seizures, syncope, facial asymmetry, numbness and headaches.  Psychiatric/Behavioral: Negative.   All other systems reviewed and are negative.    Allergies  Ambien; Cardizem; Lunesta; Metformin and related; and Protonix  Home Medications   Current Outpatient Rx  Name  Route  Sig  Dispense  Refill  . aspirin 81 MG chewable tablet   Oral   Chew 1 tablet (81 mg total) by mouth daily.         . budesonide (PULMICORT) 0.25 MG/2ML nebulizer solution   Nebulization   Take 2 mLs (0.25 mg total) by nebulization 2 (two) times daily.   60 mL   1   . diclofenac sodium (VOLTAREN) 1 % GEL   Apply externally   Apply 4 g topically 2 (two) times daily as needed (for foot pain).          Marland Kitchen  famotidine (PEPCID) 20 MG tablet   Oral   Take 1 tablet (20 mg total) by mouth at bedtime.   30 tablet   1   . furosemide (LASIX) 40 MG tablet   Oral   Take 1 tablet (40 mg total) by mouth 2 (two) times daily.   60 tablet   1   . gabapentin (NEURONTIN) 100 MG capsule   Oral   Take 1 capsule (100 mg total) by mouth 3 (three) times daily.   90 capsule   0   . glimepiride (AMARYL) 2 MG tablet   Oral   Take 2 mg by mouth 2 (two) times daily with a meal.         . HYDROcodone-acetaminophen (NORCO) 7.5-325 MG per tablet   Oral    Take 1 tablet by mouth every 4 (four) hours as needed for pain.         Marland Kitchen insulin aspart (NOVOLOG) 100 UNIT/ML injection   Subcutaneous   Inject 0-8 Units into the skin 3 (three) times daily with meals. On sliding scale:  0-100 0 units 101-150 take 3 units 151-200 take 5 units 201>300 take 8 units 400 or higher call MD         . insulin glargine (LANTUS) 100 UNIT/ML injection   Subcutaneous   Inject 32 Units into the skin at bedtime.         Marland Kitchen ipratropium-albuterol (DUONEB) 0.5-2.5 (3) MG/3ML SOLN   Nebulization   Take 3 mLs by nebulization 3 (three) times daily.         . isosorbide mononitrate (IMDUR) 15 mg TB24   Oral   Take 0.5 tablets (15 mg total) by mouth daily.   30 tablet   1   . metroNIDAZOLE (FLAGYL) 500 MG tablet   Oral   Take 1 tablet (500 mg total) by mouth every 8 (eight) hours.   45 tablet   1   . rOPINIRole (REQUIP) 1 MG tablet   Oral   Take 1 mg by mouth at bedtime.         . sertraline (ZOLOFT) 50 MG tablet   Oral   Take 50 mg by mouth daily.         . vancomycin (VANCOCIN) 50 mg/mL oral solution      500 mg PO q6hrs for 1 week, then 250mg  PO q6hrs for 1 week, then 125mg  PO q6hrs for 1 week, then 125mg  PO q12hrs 1 week and stop. Dispense full supply.   150 mL   1   . acetaminophen (TYLENOL) 325 MG tablet   Oral   Take 650 mg by mouth every 6 (six) hours as needed for pain.          BP 83/30  Pulse 104  Temp(Src) 97.8 F (36.6 C) (Oral)  Resp 10  SpO2 100% Physical Exam  Nursing note and vitals reviewed. Constitutional: She is oriented to person, place, and time. She appears well-developed and well-nourished. She appears listless. No distress.  HENT:  Head: Normocephalic and atraumatic.  Right Ear: External ear normal.  Left Ear: External ear normal.  Nose: Nose normal.  Mouth/Throat: Oropharynx is clear and moist. No oropharyngeal exudate.  Eyes: Conjunctivae and EOM are normal. Pupils are equal, round, and reactive to  light. Right eye exhibits no discharge. Left eye exhibits no discharge.  Neck: Normal range of motion. Neck supple. No JVD present. No tracheal deviation present. No thyromegaly present.  Cardiovascular: Normal rate, regular rhythm, normal heart  sounds and intact distal pulses.  Exam reveals no gallop and no friction rub.   No murmur heard. Pulmonary/Chest: Effort normal and breath sounds normal. No respiratory distress. She has no wheezes. She has no rales. She exhibits no tenderness.  Abdominal: Soft. Bowel sounds are normal. She exhibits distension (tympanic to percussion). There is no tenderness. There is no rebound and no guarding.  Musculoskeletal: Normal range of motion.  Lymphadenopathy:    She has no cervical adenopathy.  Neurological: She is oriented to person, place, and time. She appears listless. No cranial nerve deficit. Coordination normal. GCS eye subscore is 4. GCS verbal subscore is 5. GCS motor subscore is 6.  Responsive and oriented but seems tired   Skin: Skin is warm. No rash noted. She is not diaphoretic.  Psychiatric: She has a normal mood and affect. Her behavior is normal. Judgment and thought content normal.    ED Course   CENTRAL LINE Date/Time: 10/10/2012 12:06 AM Performed by: Sherryl Manges Authorized by: Derwood Kaplan Consent: Verbal consent obtained. written consent obtained. Risks and benefits: risks, benefits and alternatives were discussed Consent given by: patient and power of attorney Patient understanding: patient states understanding of the procedure being performed Patient consent: the patient's understanding of the procedure matches consent given Procedure consent: procedure consent matches procedure scheduled Patient identity confirmed: arm band and hospital-assigned identification number Time out: Immediately prior to procedure a "time out" was called to verify the correct patient, procedure, equipment, support staff and site/side marked as  required. Indications: vascular access and central pressure monitoring Anesthesia: local infiltration Local anesthetic: lidocaine 1% with epinephrine Anesthetic total: 3 ml Patient sedated: no Preparation: skin prepped with ChloraPrep Skin prep agent dried: skin prep agent completely dried prior to procedure Sterile barriers: all five maximum sterile barriers used - cap, mask, sterile gown, sterile gloves, and large sterile sheet Hand hygiene: hand hygiene performed prior to central venous catheter insertion Location details: right internal jugular Site selection rationale: easily accessed and seen with [place] Patient position: flat Catheter type: triple lumen Ultrasound guidance: yes Number of attempts: 1 Successful placement: yes Post-procedure: line sutured and dressing applied Assessment: blood return through all ports, free fluid flow, placement verified by x-ray and no pneumothorax on x-ray Patient tolerance: Patient tolerated the procedure well with no immediate complications.   (including critical care time)  Labs Reviewed  COMPREHENSIVE METABOLIC PANEL - Abnormal; Notable for the following:    Sodium 133 (*)    Potassium 3.3 (*)    Glucose, Bld 166 (*)    BUN 38 (*)    Creatinine, Ser 1.66 (*)    Calcium 6.2 (*)    Total Protein 3.2 (*)    Albumin 1.3 (*)    Alkaline Phosphatase 34 (*)    Total Bilirubin 0.2 (*)    GFR calc non Af Amer 33 (*)    GFR calc Af Amer 38 (*)    All other components within normal limits  CBC WITH DIFFERENTIAL - Abnormal; Notable for the following:    RBC 2.65 (*)    Hemoglobin 7.4 (*)    HCT 23.0 (*)    RDW 18.4 (*)    Neutrophils Relative % 80 (*)    Lymphocytes Relative 10 (*)    All other components within normal limits  URINALYSIS, ROUTINE W REFLEX MICROSCOPIC - Abnormal; Notable for the following:    Color, Urine RED (*)    APPearance CLOUDY (*)    Bilirubin Urine MODERATE (*)  Ketones, ur 15 (*)    Nitrite POSITIVE (*)     Leukocytes, UA SMALL (*)    All other components within normal limits  VALPROIC ACID LEVEL - Abnormal; Notable for the following:    Valproic Acid Lvl <10.0 (*)    All other components within normal limits  CBC - Abnormal; Notable for the following:    Hemoglobin 11.5 (*)    RDW 18.6 (*)    Platelets 491 (*)    All other components within normal limits  BASIC METABOLIC PANEL - Abnormal; Notable for the following:    Sodium 133 (*)    Glucose, Bld 106 (*)    BUN 49 (*)    Creatinine, Ser 2.32 (*)    GFR calc non Af Amer 22 (*)    GFR calc Af Amer 26 (*)    All other components within normal limits  URINE MICROSCOPIC-ADD ON - Abnormal; Notable for the following:    Squamous Epithelial / LPF FEW (*)    Bacteria, UA FEW (*)    Casts GRANULAR CAST (*)    All other components within normal limits  POCT I-STAT 3, BLOOD GAS (G3P V) - Abnormal; Notable for the following:    pH, Ven 7.198 (*)    pCO2, Ven 62.6 (*)    Bicarbonate 24.3 (*)    Acid-base deficit 4.0 (*)    All other components within normal limits  POCT I-STAT 3, BLOOD GAS (G3+) - Abnormal; Notable for the following:    pH, Arterial 7.161 (*)    pCO2 arterial 66.3 (*)    Acid-base deficit 6.0 (*)    All other components within normal limits  URINE CULTURE  CULTURE, BLOOD (ROUTINE X 2)  CULTURE, BLOOD (ROUTINE X 2)  LACTIC ACID, PLASMA  TROPONIN I  CBC  BASIC METABOLIC PANEL  PHOSPHORUS  MAGNESIUM  LACTIC ACID, PLASMA  LACTIC ACID, PLASMA  LACTIC ACID, PLASMA  TROPONIN I  TROPONIN I  CG4 I-STAT (LACTIC ACID)  POCT I-STAT TROPONIN I  TYPE AND SCREEN   Dg Chest Port 1 View  10/09/2012   *RADIOLOGY REPORT*  Clinical Data: Shortness of breath  PORTABLE CHEST - 1 VIEW  Comparison: 10/09/2012  Findings: Left IJ central line tip at the mid SVC level.  Stable heart size and vascularity.  Exam is rotated to the left.  No new airspace disease, collapse, or consolidation.  No effusion or pneumothorax.  Chronic small right  diaphragmatic hernia as before.  IMPRESSION: Stable exam.  No superimposed acute process   Original Report Authenticated By: Judie Petit. Shick, M.D.   Dg Chest Portable 1 View  10/09/2012   *RADIOLOGY REPORT*  Clinical Data: Line placement  PORTABLE CHEST - 1 VIEW  Comparison: 10/09/2012  Findings: Rotated to the left.  Right IJ central line tip at the level of the mid SVC.  Normal heart size and vascularity.  No CHF, pneumonia, effusion or pneumothorax.  IMPRESSION: Right IJ central line mid SVC level.  Stable chest exam.   Original Report Authenticated By: Judie Petit. Miles Costain, M.D.   Dg Chest Port 1 View  10/09/2012   *RADIOLOGY REPORT*  Clinical Data: Diarrhea, pain, hypotension.  PORTABLE CHEST - 1 VIEW  Comparison: 09/14/2012.  The CT chest 03/24/2012.  Findings: Trachea is midline.  Heart size is grossly stable.  Mild asymmetry of the right hilum.  Lungs are low in volume with streaky densities at the lung bases.  No definite pleural fluid.  IMPRESSION:  1.  Low lung volumes with streaky densities at the lung bases, possibly representing atelectasis or scarring. 2.  Question mild asymmetry of the right hilum.  Attention on follow-up exams is warranted.   Original Report Authenticated By: Leanna Battles, M.D.   Dg Abd Portable 2v  10/09/2012   *RADIOLOGY REPORT*  Clinical Data: Diarrhea.  History of C difficile colitis.  PORTABLE ABDOMEN - 2 VIEW  Comparison: 10/01/2012.  Findings: Gas and stool are seen scattered throughout the colon extending to the level of the distal rectum.  No pathologic distension of small bowel is noted.  No gross evidence of pneumoperitoneum.  Notably, the haustra in the transverse colon appears thickened, compatible with the reported history of C difficile colitis.  Surgical clips in the right upper quadrant of the abdomen compatible with prior cholecystectomy.  Clips are also noted in the pelvis from bilateral tubal ligation.  IMPRESSION: 1.  Nonobstructive bowel gas pattern. 2.  No  pneumoperitoneum. 3.  Markedly thickened haustral markings compatible with the patient's reported history of C difficile colitis.   Original Report Authenticated By: Trudie Reed, M.D.   1. Acute renal failure   2. COPD (chronic obstructive pulmonary disease)   3. Diabetes mellitus type 2 in obese   4. Recurrent Clostridium difficile diarrhea   5. Acute respiratory failure with hypoxia   6. Hypercapnia   7. Hypovolemic shock   8. Metabolic acidosis     MDM  57 yr old F pt here continuing to battle c. Diff. Patient has multiple recent admissions for c. Diff and hypotension. She is here again for the same. Patient first pressure 80/40 but improved with NS infusion. Patient mentating normally but seems tired. Patient with tympanic distended abdomen. She is currently taking vanc PO. Will start IV flagyl. Will assess for toxic megacolon and septic shock. Will also assess for other possible sources of infection.  Patient with persistent hypotension. She got 3L of fluid and started to have some tachycardia. Patient ABG with acidosis and elevated co2. Patient with chronically elevated co2 from COPD and now lower bicarb. After speaking with critical care it seems patient having non-anion gap metabolic acidosis from her diarrhea. Patient also with sleep apnea and drowsiness. Because patient with acute renal injury minimal urine output, and seeming volume overload with CVP of 28 she may need pressors. She is also a difficult access so central line started. Will give her pressors and she will be admitted to critical care.   Date: 10/10/2012  Rate: 117  Rhythm: sinus tachycardia  QRS Axis: normal  Intervals: normal  ST/T Wave abnormalities: normal  Conduction Disutrbances:left bundle branch block  Narrative Interpretation:   Old EKG Reviewed: changes noted    Case discussed with Dr. Elvin So, MD 10/10/12 [phone]

## 2012-10-09 NOTE — ED Notes (Signed)
Central Line placed by Dr. Lew Dawes after procedure explained to daughter and consent signed for same.

## 2012-10-10 DIAGNOSIS — R578 Other shock: Secondary | ICD-10-CM

## 2012-10-10 DIAGNOSIS — G8929 Other chronic pain: Secondary | ICD-10-CM

## 2012-10-10 LAB — URINE CULTURE

## 2012-10-10 LAB — LACTIC ACID, PLASMA
Lactic Acid, Venous: 1.3 mmol/L (ref 0.5–2.2)
Lactic Acid, Venous: 0.9 mmol/L (ref 0.5–2.2)

## 2012-10-10 LAB — POCT I-STAT 3, ART BLOOD GAS (G3+)
O2 Saturation: 97 %
TCO2: 23 mmol/L (ref 0–100)
TCO2: 24 mmol/L (ref 0–100)
pCO2 arterial: 50.1 mmHg — ABNORMAL HIGH (ref 35.0–45.0)
pCO2 arterial: 49.6 mmHg — ABNORMAL HIGH (ref 35.0–45.0)
pH, Arterial: 7.259 — ABNORMAL LOW (ref 7.350–7.450)
pO2, Arterial: 108 mmHg — ABNORMAL HIGH (ref 80.0–100.0)

## 2012-10-10 LAB — BASIC METABOLIC PANEL
CO2: 21 mEq/L (ref 19–32)
Chloride: 97 mEq/L (ref 96–112)
GFR calc non Af Amer: 25 mL/min — ABNORMAL LOW (ref >90)
Glucose, Bld: 154 mg/dL — ABNORMAL HIGH (ref 70–99)
Potassium: 4.4 mEq/L (ref 3.5–5.1)
Sodium: 130 mEq/L — ABNORMAL LOW (ref 135–145)

## 2012-10-10 LAB — TROPONIN I
Troponin I: <0.3 ng/mL (ref <0.30)
Troponin I: <0.3 ng/mL (ref <0.30)

## 2012-10-10 LAB — GLUCOSE, CAPILLARY
Glucose-Capillary: 150 mg/dL — ABNORMAL HIGH (ref 70–99)
Glucose-Capillary: 131 mg/dL — ABNORMAL HIGH (ref 70–99)
Glucose-Capillary: 135 mg/dL — ABNORMAL HIGH (ref 70–99)

## 2012-10-10 LAB — MAGNESIUM: Magnesium: 2.1 mg/dL (ref 1.5–2.5)

## 2012-10-10 LAB — CLOSTRIDIUM DIFFICILE BY PCR: Toxigenic C. Difficile by PCR: NEGATIVE

## 2012-10-10 LAB — CBC
Hemoglobin: 8.9 g/dL — ABNORMAL LOW (ref 12.0–15.0)
Platelets: 491 10*3/uL — ABNORMAL HIGH (ref 150–400)
RBC: 3.24 MIL/uL — ABNORMAL LOW (ref 3.87–5.11)
RDW: 18.6 % — ABNORMAL HIGH (ref 11.5–15.5)
WBC: 10.5 10*3/uL (ref 4.0–10.5)
WBC: 8.2 10*3/uL (ref 4.0–10.5)

## 2012-10-10 MED ORDER — NOREPINEPHRINE BITARTRATE 1 MG/ML IJ SOLN
2.0000 ug/min | INTRAVENOUS | Status: DC
  Administered 2012-10-10: 15 ug/min via INTRAVENOUS
  Filled 2012-10-10: qty 16

## 2012-10-10 MED ORDER — SODIUM CHLORIDE 0.9 % IV BOLUS (SEPSIS)
1000.0000 mL | Freq: Once | INTRAVENOUS | Status: AC
  Administered 2012-10-10: 1000 mL via INTRAVENOUS

## 2012-10-10 MED ORDER — METRONIDAZOLE 500 MG PO TABS
500.0000 mg | ORAL_TABLET | Freq: Three times a day (TID) | ORAL | Status: DC
  Administered 2012-10-10 – 2012-10-13 (×9): 500 mg via ORAL
  Filled 2012-10-10 (×12): qty 1

## 2012-10-10 MED ORDER — VANCOMYCIN 50 MG/ML ORAL SOLUTION
500.0000 mg | Freq: Four times a day (QID) | ORAL | Status: DC
  Administered 2012-10-10 – 2012-10-13 (×14): 500 mg via ORAL
  Filled 2012-10-10 (×17): qty 10

## 2012-10-10 NOTE — Progress Notes (Signed)
eLink Physician-Brief Progress Note Patient Name: KIMBELLA HEISLER DOB: [birth year] MRN: [id number]  Date of Service  10/10/2012   HPI/Events of Note   Change in condition  eICU Interventions   Holding orders written for ICU Bedside MD to see    Intervention Category Major Interventions: Other:  Delainy Mcelhiney 10/10/2012, 12:03 AM

## 2012-10-10 NOTE — Progress Notes (Signed)
INITIAL NUTRITION ASSESSMENT  DOCUMENTATION CODES Per approved criteria  -Morbid Obesity   INTERVENTION: Once diet advanced will order Boost Plus TID.   NUTRITION DIAGNOSIS: Inadequate oral intake related to altered gi function as evidenced by [name] NPO.   Goal: [name] to meet >/= 90% of their estimated nutrition needs   Monitor:  Diet advancement, PO intake, weight trend, labs  Reason for Assessment: [name] identified as at nutrition risk on the Malnutrition Screen Tool  57 y.o. female  Admitting Dx: Diarrhea  ASSESSMENT: [name] with hx of c.diff since 02/2012. [name] admitted with confusion, diarrhea, fevers, chills and abd pain. [name] found to be hypotensive with AKI. Per [name] she has lost from 265 lb to 202 lb. [name] does not like ensure but has had Boost in the past.  [name] very sleepy during interview but does state that her appetite has been good at home. Per record review [name] ate 25-50% of her meals during her last admission.  Per [name] she has been taking her medications at home.   Nutrition Focused Physical Exam:  Subcutaneous Fat:  Orbital Region: WNL Upper Arm Region: WNL Thoracic and Lumbar Region: WNL  Muscle:  Temple Region: WNL Clavicle Bone Region: WNL Clavicle and Acromion Bone Region: WNL Scapular Bone Region: mild-moderate wasting Dorsal Hand: WNL Patellar Region: WNL Anterior Thigh Region: WNL Posterior Calf Region: WNL  Edema: present  Height: Ht Readings from Last 1 Encounters:  09/29/12 5\' 5"  (1.651 m)    Weight: Wt Readings from Last 1 Encounters:  10/10/12 248 lb 0.3 oz (112.5 kg)  3+ pitting edema  Ideal Body Weight: 56.8 kg   % Ideal Body Weight: 198%  Wt Readings from Last 10 Encounters:  10/10/12 248 lb 0.3 oz (112.5 kg)  10/04/12 237 lb 9.6 oz (107.775 kg)  09/16/12 235 lb 6.4 oz (106.777 kg)  05/15/12 213 lb 12.8 oz (96.979 kg)  04/21/12 226 lb 12.8 oz (102.876 kg)  03/12/12 241 lb 13.5 oz (109.7 kg)  07/07/10 252 lb 3.2 oz (114.397 kg)    Usual  Body Weight: 213-250 lb   % Usual Body Weight: >100%  BMI:  Body mass index is 41.27 kg/(m^2).  Estimated Nutritional Needs: Kcal: 2000-2200  Protein: 100-115 grams  Fluid: > 2 L/day   Skin: no issues noted  Diet Order: NPO  EDUCATION NEEDS: -No education needs identified at this time   Intake/Output Summary (Last 24 hours) at 10/10/12 0858 Last data filed at 10/10/12 0800  Gross per 24 hour  Intake 2828.79 ml  Output    310 ml  Net 2518.79 ml    Last BM: 8/8, diarrhea   Labs:   Recent Labs Lab 10/09/12 1540 10/09/12 2313 10/10/12 0453  NA 133* 133* 130*  K 3.3* 4.7 4.4  CL 103 98 97  CO2 21 25 21   BUN 38* 49* 48*  CREATININE 1.66* 2.32* 2.10*  CALCIUM 6.2* 8.5 7.7*  MG  --   --  2.1  PHOS  --   --  6.0*  GLUCOSE 166* 106* 154*    CBG (last 3)   Recent Labs  10/10/12 0116 10/10/12 0343 10/10/12 0828  GLUCAP 99 133* 150*   Lab Results  Component Value Date   HGBA1C 6.4* 02/29/2012    Scheduled Meds: . budesonide  0.25 mg Nebulization BID  . heparin subcutaneous  5,000 Units Subcutaneous Q8H  . insulin aspart  0-15 Units Subcutaneous Q4H  . metroNIDAZOLE  500 mg Oral Q8H  . vancomycin  500 mg Oral Q6H    Continuous Infusions: . sodium chloride 100 mL/hr at 10/10/12 0230  . norepinephrine (LEVOPHED) Adult infusion 15 mcg/min (10/10/12 0800)    Past Medical History  Diagnosis Date  . COPD (chronic obstructive pulmonary disease)   . Respiratory failure   . Morbid obesity   . Tobacco abuse   . GERD (gastroesophageal reflux disease)   . Hyperlipidemia   . Chronic pain   . Hypertension   . Heart attack   . Arthritis   . Allergic rhinitis   . Diabetes mellitus, type 2   . Diabetes mellitus type 2 in obese 02/29/2012  . HTN (hypertension) 02/29/2012  . C. difficile diarrhea 09/2012  . Chronic kidney disease 09/2012    acute renal failure  . Shock 09/2012    Past Surgical History  Procedure Laterality Date  . Cholecystectomy    .  Partial hysterectomy    . Angioplasty    . Umbilical hernia repair  12/2009    Kendell Bane RD, LDN, CNSC [phone] Pager [phone] After Hours Pager

## 2012-10-10 NOTE — Progress Notes (Signed)
PULMONARY  / CRITICAL CARE MEDICINE  Name: [name] MRN: [id number] DOB: 01-24-1956    ADMISSION DATE:  10/09/2012 CONSULTATION DATE:  10/09/2012  REFERRING MD :  Vivia Ewing MD PRIMARY SERVICE: PCCM  CHIEF COMPLAINT:  Diarrhea  BRIEF PATIENT DESCRIPTION: 57 y/o female with a history of recurrent C.diff returned to the [facility] ED on 8/7 with confusion, diarrhea, subjective fevers, chills, and abdominal pain.  Found to be hypotensive with AKI.  BP initially improved with IVF in ED, but then shock. Admitted to 2100  SIGNIFICANT EVENTS / STUDIES:  8/7 CXR > CVL in place, lungs clear 8/7 KUB > no obstruction, thick haustra consistent with colitis  LINES / TUBES: 8/7 R IJ CVL >>  CULTURES: 8/7 blood >> 8/7 urine >>  ANTIBIOTICS: 8/7 flagyl > 8/7 vanc >  SUBJECTIVE:  Confused Moans with pain when stimulated  VITAL SIGNS: Temp:  [98.1 F (36.7 C)-98.7 F (37.1 C)] 98.1 F (36.7 C) (08/08 1200) Pulse Rate:  [34-121] 87 (08/08 1500) Resp:  [10-20] 13 (08/08 1500) BP: (48-121)/(17-81) 110/42 mmHg (08/08 1500) SpO2:  [68 %-100 %] 98 % (08/08 1500) FiO2 (%):  [40 %-93 %] 40 % (08/08 0343) Weight:  [112.5 kg (248 lb 0.3 oz)] 112.5 kg (248 lb 0.3 oz) (08/08 0500) HEMODYNAMICS: CVP:  [4 mmHg-12 mmHg] 12 mmHg VENTILATOR SETTINGS: Vent Mode:  [-] BIPAP FiO2 (%):  [40 %-93 %] 40 % Set Rate:  [10 bmp] 10 bmp INTAKE / OUTPUT: Intake/Output     08/07 0701 - 08/08 0700 08/08 0701 - 08/09 0700   I.V. (mL/kg) 2688.8 (23.9) 296.3 (2.6)   Total Intake(mL/kg) 2688.8 (23.9) 296.3 (2.6)   Urine (mL/kg/hr) 140 770 (0.8)   Total Output 140 770   Net +2548.8 -473.7          PHYSICAL EXAMINATION:  Gen: chronically ill appearing, obese HEENT: NCAT, PERRL, EOMi, OP clear, PULM: CTA B, few scattered upper airway rhonchi that clear CV: Tachy, distant heart sounds, cannot assess JVD AB: BS+, soft, nontender, no hsm Ext: warm, trace edema, no clubbing, no cyanosis Derm: no rash or skin  breakdown Neuro: Somnolent but arouses easily, oriented to [facility] hospital, 2014, answers some questions and follows commands but inattentive, and disorganized thinking, maew   LABS:  CBC Recent Labs     10/09/12  1540  10/09/12  1915  10/10/12  0453  WBC  9.3  10.5  8.2  HGB  7.4*  11.5*  8.9*  HCT  23.0*  36.3  28.5*  PLT  385  491*  466*   Coag's No results found for this basename: APTT, INR,  in the last 72 hours BMET Recent Labs     10/09/12  1540  10/09/12  2313  10/10/12  0453  NA  133*  133*  130*  K  3.3*  4.7  4.4  CL  103  98  97  CO2  21  25  21   BUN  38*  49*  48*  CREATININE  1.66*  2.32*  2.10*  GLUCOSE  166*  106*  154*   Electrolytes Recent Labs     10/09/12  1540  10/09/12  2313  10/10/12  0453  CALCIUM  6.2*  8.5  7.7*  MG   --    --   2.1  PHOS   --    --   6.0*   Sepsis Markers No results found for this basename: LACTICACIDVEN, PROCALCITON, O2SATVEN,  in the  last 72 hours ABG Recent Labs     10/09/12  2251  10/10/12  0156  PHART  7.161*  7.248*  PCO2ART  66.3*  50.1*  PO2ART  88.0  108.0*   Liver Enzymes Recent Labs     10/09/12  1540  AST  14  ALT  9  ALKPHOS  34*  BILITOT  0.2*  ALBUMIN  1.3*   Cardiac Enzymes Recent Labs     10/09/12  2341  10/10/12  0453  10/10/12  1119  TROPONINI  <0.30  <0.30  <0.30   Glucose Recent Labs     10/10/12  0116  10/10/12  0343  10/10/12  0828  10/10/12  1105  GLUCAP  99  133*  150*  131*      ASSESSMENT / PLAN:  57 y/o female with recurrent C.Diff who returns to the [facility] ED with AKI and hypovolemic shock from the same.  Her BP has normalized with saline and her lactic acid is normal which is reassuring.  Notably, on 7/28 she was admitted with similar symptoms (delirium, diarrhea) and had worse labwork and was managed successfully with IVF alone.  No other clear explanation for her symptoms this time around other than recurrent c.diff.  PULMONARY A: COPD, not in acute  exacerbation Mixed acidosis P:   -O2 as needed -continue home pulmicort and duoneb -consider symbicort or other combination inhaler (LABA/ICS) at discharge -prn albuterol -recheck ABG to assess acidosis  CARDIOVASCULAR A: Hypovolemic shock > improved with IVF; normal lactic acid reassuring.  Pressors initiated 8/8 P:  -continue IVF based on CVP -wean norepi as able for goal MAP > 60 or SBP 90  RENAL A:  AKI due to hypovolemia, S Cr seems to have plateaued 8/8 P:   -continue IVF - follow BMP  GASTROINTESTINAL A:  Recurrent C.diff P:   -see ID  HEMATOLOGIC A:  No acute issues P:  -monitor for bleeding  INFECTIOUS A:  Recurrent C.diff;  P:   -restart Vanc/Flagyl per ID recs as noted in 8/2 D/C summary -would reconsider Rifaximin if she was compliant with meds at home  ENDOCRINE A:  DM2 P:   -SSI/monitor glucose  NEUROLOGIC A:  Delirium due to infection/AKI/hypovolemia; similar description to previous admissions P:   -no sedating meds -frequent orientation  Levy Pupa, MD, PhD 10/10/2012, 3:27 PM [place] [phone] or if no answer [phone]

## 2012-10-10 NOTE — Progress Notes (Addendum)
[facility] PCCM  Asked by ED to re-evaluate Ms. Pickron because ABG showed an apparent respiratory acidosis.  Started on Bipap. Apparently she was transfused PRBC in the ED.  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  Refer to consult note by me today as admission H&P  Yolonda Kida PCCM Pager: [phone] Cell: [phone] If no response, call [phone]

## 2012-10-10 NOTE — Progress Notes (Signed)
Pt. Taken off bipap by RN and placed on 5L nasal cannula. RN states he spoke to Dr. Sung Amabile.

## 2012-10-10 NOTE — ED Provider Notes (Signed)
I performed a history and physical examination of  [name] and discussed her management with Dr. Lew Dawes. I agree with the history, physical, assessment, and plan of care, with the following exceptions: None I was present for the following procedures: Central line  Time Spent in Critical Care of the patient: 120 minutes  Time spent in discussions with the patient and family: 40 minutes  [name]  CRITICAL CARE Performed by: Derwood Kaplan   Total critical care time: 120 minutes  Critical care time was exclusive of separately billable procedures and treating other patients.  Critical care was necessary to treat or prevent imminent or life-threatening deterioration.  Critical care was time spent personally by me on the following activities: development of treatment plan with patient and/or surrogate as well as nursing, discussions with consultants, evaluation of patient's response to treatment, examination of patient, obtaining history from patient or surrogate, ordering and performing treatments and interventions, ordering and review of laboratory studies, ordering and review of radiographic studies, pulse oximetry and re-evaluation of patient's condition.   Pt comes in hypotensive, with elevated lactate. Primary cause for hypotension and decompensation is diarrhea, she is being treated for cdiff. Sepsis possible, but considered less likely. Pt has low albumin statis, and started 3rd spacing - albumin given. Pt's BP stayed borderline thready for the entire duration. Pt also had some altered mental status, and was tachypneic. ABG showed resp acidosis. Bipap ordered, which worsened the pressure more. CCM admitting  Hypovolemic Shock Uncompensated Respiratory Acidosis Possibly metabolic Acidosis    Derwood Kaplan, MD 10/10/12 [phone]

## 2012-10-11 LAB — MAGNESIUM: Magnesium: 2.1 mg/dL (ref 1.5–2.5)

## 2012-10-11 LAB — GLUCOSE, CAPILLARY
Glucose-Capillary: 139 mg/dL — ABNORMAL HIGH (ref 70–99)
Glucose-Capillary: 145 mg/dL — ABNORMAL HIGH (ref 70–99)
Glucose-Capillary: 162 mg/dL — ABNORMAL HIGH (ref 70–99)
Glucose-Capillary: 166 mg/dL — ABNORMAL HIGH (ref 70–99)

## 2012-10-11 LAB — BLOOD GAS, ARTERIAL
Acid-base deficit: 4.6 mmol/L — ABNORMAL HIGH (ref 0.0–2.0)
Delivery systems: POSITIVE
Drawn by: 23604
Inspiratory PAP: 16
O2 Saturation: 98.7 %

## 2012-10-11 LAB — CBC
Platelets: 549 10*3/uL — ABNORMAL HIGH (ref 150–400)
RDW: 18.6 % — ABNORMAL HIGH (ref 11.5–15.5)
WBC: 9.4 10*3/uL (ref 4.0–10.5)

## 2012-10-11 LAB — BASIC METABOLIC PANEL
GFR calc Af Amer: 62 mL/min — ABNORMAL LOW (ref >90)
GFR calc non Af Amer: 53 mL/min — ABNORMAL LOW (ref >90)
Potassium: 4.2 mEq/L (ref 3.5–5.1)
Sodium: 136 mEq/L (ref 135–145)

## 2012-10-11 LAB — PHOSPHORUS: Phosphorus: 3.7 mg/dL (ref 2.3–4.6)

## 2012-10-11 MED ORDER — SODIUM CHLORIDE 0.9 % IV SOLN
0.0300 [IU]/min | INTRAVENOUS | Status: DC
  Administered 2012-10-11: 0.03 [IU]/min via INTRAVENOUS
  Filled 2012-10-11: qty 2.5

## 2012-10-11 MED ORDER — WHITE PETROLATUM GEL
Status: AC
  Administered 2012-10-11: 0.2
  Filled 2012-10-11: qty 5

## 2012-10-11 MED ORDER — HYDROCODONE-ACETAMINOPHEN 7.5-325 MG PO TABS
1.0000 | ORAL_TABLET | ORAL | Status: DC | PRN
  Administered 2012-10-11 – 2012-10-15 (×13): 1 via ORAL
  Filled 2012-10-11 (×14): qty 1

## 2012-10-11 MED ORDER — PHENYLEPHRINE HCL 10 MG/ML IJ SOLN
30.0000 ug/min | INTRAVENOUS | Status: DC
  Administered 2012-10-11: 20 ug/min via INTRAVENOUS
  Administered 2012-10-11: 40 ug/min via INTRAVENOUS
  Administered 2012-10-11: 35 ug/min via INTRAVENOUS
  Filled 2012-10-11 (×3): qty 2

## 2012-10-11 NOTE — Progress Notes (Signed)
eLink Physician-Brief Progress Note Patient Name: [name] DOB: 1955-12-04 MRN: [id number]  Date of Service  10/11/2012   HPI/Events of Note   New onset AF-RVR  eICU Interventions  EKG Add vaso  & lower levo If not able, consider neo gtt & amio Heparin if does not cardiovert in next few hrs   Intervention Category Major Interventions: Arrhythmia - evaluation and management  [name] V. 10/11/2012, 12:06 AM

## 2012-10-11 NOTE — Progress Notes (Signed)
PULMONARY  / CRITICAL CARE MEDICINE  Name: [name] MRN: [id number] DOB: May 03, 1955    ADMISSION DATE:  10/09/2012 CONSULTATION DATE:  10/09/2012  REFERRING MD :  Vivia Ewing MD PRIMARY SERVICE: PCCM  CHIEF COMPLAINT:  Diarrhea  BRIEF PATIENT DESCRIPTION: 57 y/o female with a history of recurrent C.diff returned to the [facility] ED on 8/7 with confusion, diarrhea, subjective fevers, chills, and abdominal pain.  Found to be hypotensive with AKI.  BP initially improved with IVF in ED, but then shock. Admitted to 2100.    SIGNIFICANT EVENTS / STUDIES:  8/7 CXR > CVL in place, lungs clear 8/7 KUB > no obstruction, thick haustra consistent with colitis  LINES / TUBES: 8/7 R IJ CVL >>  CULTURES: 8/7 blood >> 8/7 urine >>negative  CDiff 8/8>>> neg   ANTIBIOTICS: 8/7 flagyl > 8/7 vanc >  SUBJECTIVE/OVERNIGHT:  Afib RVR overnight.  Remains tachy.   VITAL SIGNS: Temp:  [98.1 F (36.7 C)-99 F (37.2 C)] 98.6 F (37 C) (08/09 0800) Pulse Rate:  [71-140] 132 (08/09 0800) Resp:  [10-21] 14 (08/09 0800) BP: (75-146)/(41-91) 75/46 mmHg (08/09 0800) SpO2:  [80 %-100 %] 100 % (08/09 0931) FiO2 (%):  [40 %] 40 % (08/09 0700) HEMODYNAMICS: CVP:  [6 mmHg-17 mmHg] 12 mmHg VENTILATOR SETTINGS: Vent Mode:  [-] BIPAP FiO2 (%):  [40 %] 40 % Set Rate:  [10 bmp] 10 bmp PEEP:  [8 cmH20] 8 cmH20 INTAKE / OUTPUT: Intake/Output     08/08 0701 - 08/09 0700 08/09 0701 - 08/10 0700   P.O. 100    I.V. (mL/kg) 3408.9 (30.3)    Total Intake(mL/kg) 3508.9 (31.2)    Urine (mL/kg/hr) 2195 (0.8) 900 (2.2)   Stool 4500 (1.7)    Total Output 6695 900   Net -3186.1 -900        Stool Occurrence 3 x      PHYSICAL EXAMINATION:  Gen: chronically ill appearing, obese HEENT: NCAT, PERRL, EOMi, OP clear, PULM: CTA B, few scattered upper airway rhonchi that clear CV: Tachy, distant heart sounds, cannot assess JVD AB: BS+, soft, nontender, no hsm Ext: warm, 2+ edema, no clubbing, no cyanosis Derm: no  rash or skin breakdown Neuro: awake, alert, appropriate, MAE   LABS:  CBC  Recent Labs Lab 10/09/12 1915 10/10/12 0453 10/11/12 0440  HGB 11.5* 8.9* 9.4*  HCT 36.3 28.5* 29.6*  WBC 10.5 8.2 9.4  PLT 491* 466* 549*    Coag's No results found for this basename: APTT, INR,  in the last 72 hours  BMET  Recent Labs Lab 10/09/12 1540 10/09/12 2313 10/10/12 0453 10/11/12 0440  NA 133* 133* 130* 136  K 3.3* 4.7 4.4 4.2  CL 103 98 97 103  CO2 21 25 21 22   GLUCOSE 166* 106* 154* 168*  BUN 38* 49* 48* 35*  CREATININE 1.66* 2.32* 2.10* 1.12*  CALCIUM 6.2* 8.5 7.7* 8.2*  MG  --   --  2.1 2.1  PHOS  --   --  6.0* 3.7    Electrolytes Recent Labs     10/09/12  2313  10/10/12  0453  10/11/12  0440  CALCIUM  8.5  7.7*  8.2*  MG   --   2.1  2.1  PHOS   --   6.0*  3.7   Sepsis Markers No results found for this basename: LACTICACIDVEN, PROCALCITON, O2SATVEN,  in the last 72 hours  ABG  Recent Labs Lab 10/09/12 2241 10/09/12 2251 10/10/12 0156 10/10/12 1554  10/11/12 0415  PHART  --  7.161* 7.248* 7.259* 7.289*  PCO2ART  --  66.3* 50.1* 49.6* 45.1*  PO2ART  --  88.0 108.0* 81.0 129.0*  HCO3 24.3* 23.7 21.9 22.3 20.9  TCO2 26 26 23 24  22.3  O2SAT 59.0 93.0 97.0 94.0 98.7   Liver Enzymes  Recent Labs Lab 10/09/12 1540  AST 14  ALT 9  ALKPHOS 34*  BILITOT 0.2*  PROT 3.2*  ALBUMIN 1.3*    Cardiac Enzymes Recent Labs     10/09/12  2341  10/10/12  0453  10/10/12  1119  TROPONINI  <0.30  <0.30  <0.30   Glucose Recent Labs     10/10/12  1105  10/10/12  1630  10/10/12  2042  10/11/12  0036  10/11/12  0341  10/11/12  0757  GLUCAP  131*  135*  117*  145*  162*  166*     ASSESSMENT / PLAN:   PULMONARY A: COPD, not in acute exacerbation Mixed acidosis P:   -O2 as needed -continue home pulmicort and duoneb -consider symbicort or other combination inhaler (LABA/ICS) at discharge -prn albuterol -suspect OSA/OHS - pt has said she will not  wear CPAP at home even if ordered    CARDIOVASCULAR A: Hypovolemic shock > improved with IVF; normal lactic acid reassuring.  Pressors initiated 8/8 Tachycardia - sinus  CVP=12-13 P:  -continue gentle volume  -cont wean pressors to off as able  -?some degree narcotic withdrawal - will add back PRN home pain meds  -cont hold home lasix   RENAL A:  AKI due to hypovolemia, S Cr seems to have plateaued 8/8 P:   -continue IVF - follow BMP  GASTROINTESTINAL A:  Recurrent C.diff Diarrhea  P:   -see ID -??etiology of diarrhea if CDiff truly neg  -consider GI input prior to d/c   HEMATOLOGIC A:  No acute issues P:  -monitor for bleeding  INFECTIOUS A:  Recurrent C.diff; CDiff neg this admit   P:   -cont empiric Vanc/Flagyl per ID recs as noted in 8/2 D/C summary -would reconsider Rifaximin if she was compliant with meds at home  ENDOCRINE A:  DM2 P:   -SSI/monitor glucose  NEUROLOGIC A:  Delirium due to infection/AKI/hypovolemia; similar description to previous admissions - resolved  P:   -no sedating meds -frequent orientation   35 minutes CC time  Levy Pupa, MD, PhD 10/11/2012, 11:13 AM [place] [phone] or if no answer [phone]

## 2012-10-12 DIAGNOSIS — J9600 Acute respiratory failure, unspecified whether with hypoxia or hypercapnia: Secondary | ICD-10-CM

## 2012-10-12 LAB — CBC
Hemoglobin: 9 g/dL — ABNORMAL LOW (ref 12.0–15.0)
MCH: 27.4 pg (ref 26.0–34.0)
MCHC: 32.3 g/dL (ref 30.0–36.0)
MCV: 85.1 fL (ref 78.0–100.0)

## 2012-10-12 LAB — GLUCOSE, CAPILLARY
Glucose-Capillary: 140 mg/dL — ABNORMAL HIGH (ref 70–99)
Glucose-Capillary: 139 mg/dL — ABNORMAL HIGH (ref 70–99)
Glucose-Capillary: 113 mg/dL — ABNORMAL HIGH (ref 70–99)
Glucose-Capillary: 162 mg/dL — ABNORMAL HIGH (ref 70–99)
Glucose-Capillary: 113 mg/dL — ABNORMAL HIGH (ref 70–99)

## 2012-10-12 LAB — BASIC METABOLIC PANEL
BUN: 20 mg/dL (ref 6–23)
Calcium: 8.2 mg/dL — ABNORMAL LOW (ref 8.4–10.5)
Creatinine, Ser: 0.69 mg/dL (ref 0.50–1.10)
GFR calc non Af Amer: >90 mL/min (ref >90)
Glucose, Bld: 153 mg/dL — ABNORMAL HIGH (ref 70–99)
Sodium: 136 mEq/L (ref 135–145)

## 2012-10-12 MED ORDER — SODIUM CHLORIDE 0.9 % IV BOLUS (SEPSIS)
1000.0000 mL | INTRAVENOUS | Status: AC
  Administered 2012-10-12 (×2): 1000 mL via INTRAVENOUS

## 2012-10-12 MED ORDER — METOPROLOL TARTRATE 1 MG/ML IV SOLN
5.0000 mg | INTRAVENOUS | Status: AC
  Administered 2012-10-12: 5 mg via INTRAVENOUS
  Filled 2012-10-12: qty 5

## 2012-10-12 MED ORDER — METOPROLOL TARTRATE 25 MG PO TABS
25.0000 mg | ORAL_TABLET | Freq: Two times a day (BID) | ORAL | Status: DC
  Administered 2012-10-12 – 2012-10-13 (×4): 25 mg via ORAL
  Filled 2012-10-12 (×6): qty 1

## 2012-10-12 MED ORDER — ALBUTEROL SULFATE (5 MG/ML) 0.5% IN NEBU
2.5000 mg | INHALATION_SOLUTION | RESPIRATORY_TRACT | Status: DC | PRN
  Administered 2012-10-15: 2.5 mg via RESPIRATORY_TRACT
  Filled 2012-10-12: qty 0.5

## 2012-10-12 MED ORDER — METOPROLOL TARTRATE 1 MG/ML IV SOLN
5.0000 mg | Freq: Once | INTRAVENOUS | Status: AC
  Administered 2012-10-12: 5 mg via INTRAVENOUS
  Filled 2012-10-12: qty 5

## 2012-10-12 MED ORDER — CHLORHEXIDINE GLUCONATE 0.12 % MT SOLN
15.0000 mL | Freq: Two times a day (BID) | OROMUCOSAL | Status: DC
  Administered 2012-10-13 – 2012-10-25 (×20): 15 mL via OROMUCOSAL
  Filled 2012-10-12 (×27): qty 15

## 2012-10-12 MED ORDER — METOPROLOL TARTRATE 1 MG/ML IV SOLN
5.0000 mg | Freq: Four times a day (QID) | INTRAVENOUS | Status: DC | PRN
  Administered 2012-10-12 – 2012-10-14 (×5): 5 mg via INTRAVENOUS
  Filled 2012-10-12 (×6): qty 5

## 2012-10-12 MED ORDER — LORAZEPAM 2 MG/ML IJ SOLN
1.0000 mg | INTRAMUSCULAR | Status: DC | PRN
  Administered 2012-10-12 – 2012-10-13 (×2): 1 mg via INTRAVENOUS
  Filled 2012-10-12 (×2): qty 1

## 2012-10-12 MED ORDER — BIOTENE DRY MOUTH MT LIQD
15.0000 mL | Freq: Two times a day (BID) | OROMUCOSAL | Status: DC
  Administered 2012-10-13 – 2012-10-25 (×19): 15 mL via OROMUCOSAL

## 2012-10-12 NOTE — Progress Notes (Signed)
10/12/12 2115  Pt being cleaned by NT and pt HR went into 140s-150s. Pt had received 5mg  Lopressor x2 on AM shift. Pt HR in Afib RVR. eLink MD called. New orders for 5mg  Lopressor IV q6 prn and Ativan 1mg  IV q4 given. Pt takes xanax at home for anxiety.   Will continue to monitor pt.   Elisha Headland RN

## 2012-10-12 NOTE — Progress Notes (Signed)
eLink Physician-Brief Progress Note Patient Name: [name] DOB: 1956/02/02 MRN: [id number]  Date of Service  10/12/2012   HPI/Events of Note   Anxiety SVT, responded to Metoprolol earlier   eICU Interventions   Ativan 1 mg IV q4h PRN Metoprolol 5 mg IV q6h PRN    Intervention Category Major Interventions: Arrhythmia - evaluation and management;Delirium, psychosis, severe agitation - evaluation and management  [name] 10/12/2012, 9:30 PM

## 2012-10-12 NOTE — Progress Notes (Signed)
10/12/12 2315  Pt attempted Bipap for 1.5hrs. Pt requests Bipap to be off and to be transitioned to 5L/[place] at this time. RT made aware.   Elisha Headland RN

## 2012-10-12 NOTE — Progress Notes (Signed)
PULMONARY  / CRITICAL CARE MEDICINE  Name: [name] MRN: [id number] DOB: 1955/10/31    ADMISSION DATE:  10/09/2012 CONSULTATION DATE:  10/09/2012  REFERRING MD :  Vivia Ewing MD PRIMARY SERVICE: PCCM  CHIEF COMPLAINT:  Diarrhea  BRIEF PATIENT DESCRIPTION: 57 y/o female with a history of recurrent C.diff returned to the [facility] ED on 8/7 with confusion, diarrhea, subjective fevers, chills, and abdominal pain.  Found to be hypotensive with AKI.  BP initially improved with IVF in ED, but then shock. Admitted to 2100.    SIGNIFICANT EVENTS / STUDIES:  8/7 CXR > CVL in place, lungs clear 8/7 KUB > no obstruction, thick haustra consistent with colitis  LINES / TUBES: 8/7 R IJ CVL >>  CULTURES: 8/7 blood >> 8/7 urine >>negative  CDiff 8/8>>> negative  ANTIBIOTICS: 8/7 flagyl > 8/7 vanc >  SUBJECTIVE/OVERNIGHT:  Tachy to 140's. Pressors off.   VITAL SIGNS: Temp:  [97.4 F (36.3 C)-98.3 F (36.8 C)] 97.9 F (36.6 C) (08/10 0739) Pulse Rate:  [90-143] 143 (08/10 0700) Resp:  [6-25] 20 (08/10 0700) BP: (75-120)/(43-95) 102/43 mmHg (08/10 0700) SpO2:  [95 %-100 %] 97 % (08/10 0700) Weight:  [112.5 kg (248 lb 0.3 oz)] 112.5 kg (248 lb 0.3 oz) (08/10 0500) HEMODYNAMICS: CVP:  [11 mmHg-75 mmHg] 15 mmHg VENTILATOR SETTINGS:   INTAKE / OUTPUT: Intake/Output     08/09 0701 - 08/10 0700 08/10 0701 - 08/11 0700   P.O. 660    I.V. (mL/kg) 3146.3 (28)    Total Intake(mL/kg) 3806.3 (33.8)    Urine (mL/kg/hr) 2610 (1)    Stool 600 (0.2)    Total Output 3210     Net +596.3            PHYSICAL EXAMINATION: Gen: chronically ill appearing, obese HEENT: NCAT, PERRL, EOMi, OP clear, PULM: CTA B, few scattered upper airway rhonchi that clear CV: Tachy, distant heart sounds, cannot assess JVD AB: BS+, soft, nontender, no hsm Ext: warm, 2+ edema, no clubbing, no cyanosis Derm: no rash or skin breakdown Neuro: awake, alert, appropriate, MAE   LABS:  CBC  Recent Labs Lab  10/10/12 0453 10/11/12 0440 10/12/12 0400  HGB 8.9* 9.4* 9.0*  HCT 28.5* 29.6* 27.9*  WBC 8.2 9.4 6.9  PLT 466* 549* 482*    Coag's No results found for this basename: APTT, INR,  in the last 72 hours  BMET  Recent Labs Lab 10/09/12 1540 10/09/12 2313 10/10/12 0453 10/11/12 0440 10/12/12 0400  NA 133* 133* 130* 136 136  K 3.3* 4.7 4.4 4.2 3.8  CL 103 98 97 103 104  CO2 21 25 21 22 26   GLUCOSE 166* 106* 154* 168* 153*  BUN 38* 49* 48* 35* 20  CREATININE 1.66* 2.32* 2.10* 1.12* 0.69  CALCIUM 6.2* 8.5 7.7* 8.2* 8.2*  MG  --   --  2.1 2.1  --   PHOS  --   --  6.0* 3.7  --     Electrolytes Recent Labs     10/10/12  0453  10/11/12  0440  10/12/12  0400  CALCIUM  7.7*  8.2*  8.2*  MG  2.1  2.1   --   PHOS  6.0*  3.7   --    Sepsis Markers No results found for this basename: LACTICACIDVEN, PROCALCITON, O2SATVEN,  in the last 72 hours  ABG  Recent Labs Lab 10/09/12 2241 10/09/12 2251 10/10/12 0156 10/10/12 1554 10/11/12 0415  PHART  --  7.161* 7.248*  7.259* 7.289*  PCO2ART  --  66.3* 50.1* 49.6* 45.1*  PO2ART  --  88.0 108.0* 81.0 129.0*  HCO3 24.3* 23.7 21.9 22.3 20.9  TCO2 26 26 23 24  22.3  O2SAT 59.0 93.0 97.0 94.0 98.7   Liver Enzymes  Recent Labs Lab 10/09/12 1540  AST 14  ALT 9  ALKPHOS 34*  BILITOT 0.2*  PROT 3.2*  ALBUMIN 1.3*    Cardiac Enzymes Recent Labs     10/09/12  2341  10/10/12  0453  10/10/12  1119  TROPONINI  <0.30  <0.30  <0.30   Glucose Recent Labs     10/11/12  1115  10/11/12  1527  10/11/12  2031  10/12/12  0019  10/12/12  0421  10/12/12  0734  GLUCAP  139*  150*  168*  182*  140*  139*     ASSESSMENT / PLAN:   PULMONARY A: COPD, not in acute exacerbation Mixed acidosis P:   -O2 as needed -continue home pulmicort and duoneb -consider symbicort or other combination inhaler (LABA/ICS) at discharge -prn albuterol -suspect OSA/OHS - pt has said she will not wear CPAP at home even if ordered; will try  BiPAP qhs    CARDIOVASCULAR A: Hypovolemic shock > improved with IVF; normal lactic acid reassuring.  Pressors off 8/9 Tachycardia - sinus vs A fib CVP=12-15 P:  -push more volume to meet losses from diarrhea -likely some degree narcotic withdrawal - will add back PRN home pain meds  -cont hold home lasix  -try metoprolol for rate control, initially IV. She is allergic to diltiazem  RENAL A:  AKI due to hypovolemia, S Cr seems to have plateaued 8/8, resolved 8/10 P:   - push IVF as above - follow BMP  GASTROINTESTINAL A:  Hx Recurrent C.diff, now with chronic colitis and C diff PCR negative Diarrhea  P:   -see ID -??etiology of diarrhea if CDiff truly neg  -consider GI input prior to d/c  -clear diet  HEMATOLOGIC A:  No acute issues P:  -monitor for bleeding  INFECTIOUS A:  Recurrent C.diff; CDiff neg this admit   P:   -cont empiric Vanc/Flagyl per ID recs as noted in 8/2 D/C summary -would reconsider Rifaximin if she was compliant with meds at home  ENDOCRINE A:  DM2 P:   -SSI/monitor glucose  NEUROLOGIC A:  Delirium due to infection/AKI/hypovolemia; similar description to previous admissions - resolving P:   -no sedating meds (she is on home narc regimen) -frequent orientation   35 minutes CC time  Levy Pupa, MD, PhD 10/12/2012, 8:59 AM [place] [facility] [phone] or if no answer [phone]

## 2012-10-13 DIAGNOSIS — A0472 Enterocolitis due to Clostridium difficile, not specified as recurrent: Principal | ICD-10-CM

## 2012-10-13 LAB — PHOSPHORUS: Phosphorus: 3.1 mg/dL (ref 2.3–4.6)

## 2012-10-13 LAB — MAGNESIUM: Magnesium: 1.8 mg/dL (ref 1.5–2.5)

## 2012-10-13 LAB — GLUCOSE, CAPILLARY
Glucose-Capillary: 130 mg/dL — ABNORMAL HIGH (ref 70–99)
Glucose-Capillary: 131 mg/dL — ABNORMAL HIGH (ref 70–99)
Glucose-Capillary: 132 mg/dL — ABNORMAL HIGH (ref 70–99)
Glucose-Capillary: 140 mg/dL — ABNORMAL HIGH (ref 70–99)
Glucose-Capillary: 154 mg/dL — ABNORMAL HIGH (ref 70–99)

## 2012-10-13 LAB — BASIC METABOLIC PANEL
CO2: 24 mEq/L (ref 19–32)
Chloride: 108 mEq/L (ref 96–112)
Potassium: 4 mEq/L (ref 3.5–5.1)
Sodium: 141 mEq/L (ref 135–145)

## 2012-10-13 LAB — CBC
Hemoglobin: 9.6 g/dL — ABNORMAL LOW (ref 12.0–15.0)
MCHC: 31.8 g/dL (ref 30.0–36.0)
RBC: 3.47 MIL/uL — ABNORMAL LOW (ref 3.87–5.11)
WBC: 9.2 10*3/uL (ref 4.0–10.5)

## 2012-10-13 LAB — PROCALCITONIN: Procalcitonin: 0.23 ng/mL

## 2012-10-13 MED ORDER — VANCOMYCIN 50 MG/ML ORAL SOLUTION: 125.0000 mg | Freq: Every day | ORAL | Status: DC

## 2012-10-13 MED ORDER — ROPINIROLE HCL 1 MG PO TABS
1.0000 mg | ORAL_TABLET | Freq: Every day | ORAL | Status: DC
  Administered 2012-10-13 – 2012-10-18 (×6): 1 mg via ORAL
  Filled 2012-10-13 (×9): qty 1

## 2012-10-13 MED ORDER — GABAPENTIN 100 MG PO CAPS
100.0000 mg | ORAL_CAPSULE | Freq: Three times a day (TID) | ORAL | Status: DC
  Administered 2012-10-13 – 2012-10-18 (×18): 100 mg via ORAL
  Filled 2012-10-13 (×24): qty 1

## 2012-10-13 MED ORDER — MAGNESIUM SULFATE 50 % IJ SOLN
2.0000 g | Freq: Once | INTRAVENOUS | Status: AC
  Administered 2012-10-13: 2 g via INTRAVENOUS
  Filled 2012-10-13: qty 4

## 2012-10-13 MED ORDER — SODIUM CHLORIDE 0.45 % IV SOLN
INTRAVENOUS | Status: DC
  Administered 2012-10-13: 12:00:00 via INTRAVENOUS

## 2012-10-13 MED ORDER — METRONIDAZOLE IN NACL 5-0.79 MG/ML-% IV SOLN
500.0000 mg | Freq: Three times a day (TID) | INTRAVENOUS | Status: DC
  Administered 2012-10-13 – 2012-10-14 (×4): 500 mg via INTRAVENOUS
  Filled 2012-10-13 (×6): qty 100

## 2012-10-13 MED ORDER — VANCOMYCIN 50 MG/ML ORAL SOLUTION: 125.0000 mg | ORAL | Status: DC

## 2012-10-13 MED ORDER — SACCHAROMYCES BOULARDII 250 MG PO CAPS
250.0000 mg | ORAL_CAPSULE | Freq: Two times a day (BID) | ORAL | Status: DC
  Administered 2012-10-13 – 2012-10-19 (×12): 250 mg via ORAL
  Filled 2012-10-13 (×15): qty 1

## 2012-10-13 MED ORDER — SERTRALINE HCL 50 MG PO TABS
50.0000 mg | ORAL_TABLET | Freq: Every day | ORAL | Status: DC
  Administered 2012-10-13 – 2012-10-19 (×7): 50 mg via ORAL
  Filled 2012-10-13 (×8): qty 1

## 2012-10-13 MED ORDER — VANCOMYCIN 50 MG/ML ORAL SOLUTION
500.0000 mg | Freq: Four times a day (QID) | ORAL | Status: DC
  Administered 2012-10-13 – 2012-10-15 (×9): 500 mg via ORAL
  Filled 2012-10-13 (×10): qty 10

## 2012-10-13 MED ORDER — VANCOMYCIN 50 MG/ML ORAL SOLUTION: 125.0000 mg | Freq: Two times a day (BID) | ORAL | Status: DC

## 2012-10-13 NOTE — Consult Note (Signed)
[facility]    Date of Admission:  10/09/2012  Date of Consult:  10/13/2012  Reason for Consult: C. Diff colitis Referring Physician: Dr. Rory Percy   HPI: [name] is an 57 y.o. female. with PMH recurrent C. Diff, medication non compliance, COPD, obesity, HL, HTN, GERD, and DMII (02/23/12 A1C 6.4) who presents back to [facility] ED after recent discharge on 10/04/12 due to Flagyl and Vanc noncompliance at home for C. Diff infection. She complains for diffuse abdominal pain, numerous watery stools (up to 30) at home, confusion, subjective fevers, and dizziness. The patient says she was unable to fill her prescriptions on discharge and has not been taking her vanc or flagyl at home. She is currently resting in bed in mild discomfort.   Past Medical History  Diagnosis Date  . COPD (chronic obstructive pulmonary disease)   . Respiratory failure   . Morbid obesity   . Tobacco abuse   . GERD (gastroesophageal reflux disease)   . Hyperlipidemia   . Chronic pain   . Hypertension   . Heart attack   . Arthritis   . Allergic rhinitis   . Diabetes mellitus, type 2   . Diabetes mellitus type 2 in obese 02/29/2012  . HTN (hypertension) 02/29/2012  . C. difficile diarrhea 09/2012  . Chronic kidney disease 09/2012    acute renal failure  . Shock 09/2012    Past Surgical History  Procedure Laterality Date  . Cholecystectomy    . Partial hysterectomy    . Angioplasty    . Umbilical hernia repair  12/2009  ergies:   Allergies  Allergen Reactions  . Ambien (Zolpidem Tartrate) Other (See Comments)    Severe hallucinations and behavior changes reported by family  . Cardizem (Diltiazem Hcl) Swelling  . Lunesta (Eszopiclone) Other (See Comments)    Nightmares & hallucinations  . Metformin And Related Swelling  . Protonix (Pantoprazole Sodium) Other (See Comments)    Unknown reaction-"Doctor told me to never take it"     Medications: I have reviewed  patients current medications as documented in Epic Anti-infectives   Start     Dose/Rate Route Frequency Ordered Stop   10/13/12 1400  metroNIDAZOLE (FLAGYL) IVPB 500 mg     500 mg 100 mL/hr over 60 Minutes Intravenous Every 8 hours 10/13/12 1110     10/10/12 1000  metroNIDAZOLE (FLAGYL) tablet 500 mg  Status:  Discontinued     500 mg Oral 3 times per day 10/10/12 0235 10/13/12 1110   10/10/12 0800  vancomycin (VANCOCIN) 50 mg/mL oral solution 500 mg     500 mg Oral 4 times per day 10/10/12 0239     10/10/12 0000  vancomycin (VANCOCIN) 50 mg/mL oral solution 500 mg  Status:  Discontinued     500 mg Oral 4 times per day 10/09/12 2135 10/10/12 0239   10/09/12 2200  metroNIDAZOLE (FLAGYL) tablet 500 mg  Status:  Discontinued     500 mg Oral 3 times per day 10/09/12 2135 10/10/12 0235   10/09/12 1545  metroNIDAZOLE (FLAGYL) IVPB 500 mg     500 mg 100 mL/hr over 60 Minutes Intravenous  Once 10/09/12 1539 10/09/12 1721      Social History:  reports that she quit smoking about 7 months ago. Her smoking use included Cigarettes. She has a 40 pack-year smoking history. She has never used smokeless tobacco. She reports that she does not drink alcohol or use illicit drugs.  History reviewed. No pertinent family history.  As in HPI and primary teams notes otherwise 12 point review of systems is negative  Blood pressure 90/57, pulse 114, temperature 97.4 F (36.3 C), temperature source Oral, resp. rate 16, height 5\' 5"  (1.651 m), weight 116.574 kg (257 lb), SpO2 99.00%. General: Alert and awake, oriented x3, NAD HEENT: anicteric sclera, pupils reactive to light and accommodation, EOMI CVS regular rate, normal r,  no murmur rubs or gallops Chest: coarse breath sounds Abdomen: obese, soft, nontender, nondistended, normal bowel sounds Extremities: 2+ pitting edema B/L LE Skin: no rashes Neuro: nonfocal, strength and sensation intact   Results for orders placed during the hospital encounter of  10/09/12 (from the past 48 hour(s))  GLUCOSE, CAPILLARY     Status: Abnormal   Collection Time    10/11/12  3:27 PM      Result Value Range   Glucose-Capillary 150 (*) 70 - 99 mg/dL  GLUCOSE, CAPILLARY     Status: Abnormal   Collection Time    10/11/12  8:31 PM      Result Value Range   Glucose-Capillary 168 (*) 70 - 99 mg/dL   Comment 1 Notify RN    GLUCOSE, CAPILLARY     Status: Abnormal   Collection Time    10/12/12 12:19 AM      Result Value Range   Glucose-Capillary 182 (*) 70 - 99 mg/dL   Comment 1 Notify RN    CBC     Status: Abnormal   Collection Time    10/12/12  4:00 AM      Result Value Range   WBC 6.9  4.0 - 10.5 K/uL   RBC 3.28 (*) 3.87 - 5.11 MIL/uL   Hemoglobin 9.0 (*) 12.0 - 15.0 g/dL   HCT 16.1 (*) 09.6 - 04.5 %   MCV 85.1  78.0 - 100.0 fL   MCH 27.4  26.0 - 34.0 pg   MCHC 32.3  30.0 - 36.0 g/dL   RDW 40.9 (*) 81.1 - 91.4 %   Platelets 482 (*) 150 - 400 K/uL  BASIC METABOLIC PANEL     Status: Abnormal   Collection Time    10/12/12  4:00 AM      Result Value Range   Sodium 136  135 - 145 mEq/L   Potassium 3.8  3.5 - 5.1 mEq/L   Chloride 104  96 - 112 mEq/L   CO2 26  19 - 32 mEq/L   Glucose, Bld 153 (*) 70 - 99 mg/dL   BUN 20  6 - 23 mg/dL   Comment: DELTA CHECK NOTED   Creatinine, Ser 0.69  0.50 - 1.10 mg/dL   Comment: DELTA CHECK NOTED   Calcium 8.2 (*) 8.4 - 10.5 mg/dL   GFR calc non Af Amer >90  >90 mL/min   GFR calc Af Amer >90  >90 mL/min   Comment:            The eGFR has been calculated     using the CKD EPI equation.     This calculation has not been     validated in all clinical     situations.     eGFR's persistently     <90 mL/min signify     possible Chronic Kidney Disease.  GLUCOSE, CAPILLARY     Status: Abnormal   Collection Time    10/12/12  4:21 AM      Result Value Range   Glucose-Capillary 140 (*) 70 -  99 mg/dL   Comment 1 Notify RN    GLUCOSE, CAPILLARY     Status: Abnormal   Collection Time    10/12/12  7:34 AM       Result Value Range   Glucose-Capillary 139 (*) 70 - 99 mg/dL  GLUCOSE, CAPILLARY     Status: Abnormal   Collection Time    10/12/12 11:34 AM      Result Value Range   Glucose-Capillary 113 (*) 70 - 99 mg/dL  GLUCOSE, CAPILLARY     Status: Abnormal   Collection Time    10/12/12  3:29 PM      Result Value Range   Glucose-Capillary 162 (*) 70 - 99 mg/dL  GLUCOSE, CAPILLARY     Status: Abnormal   Collection Time    10/12/12  7:45 PM      Result Value Range   Glucose-Capillary 113 (*) 70 - 99 mg/dL  GLUCOSE, CAPILLARY     Status: Abnormal   Collection Time    10/13/12 12:14 AM      Result Value Range   Glucose-Capillary 131 (*) 70 - 99 mg/dL   Comment 1 Notify RN    GLUCOSE, CAPILLARY     Status: Abnormal   Collection Time    10/13/12  4:11 AM      Result Value Range   Glucose-Capillary 132 (*) 70 - 99 mg/dL   Comment 1 Notify RN    BASIC METABOLIC PANEL     Status: Abnormal   Collection Time    10/13/12  5:00 AM      Result Value Range   Sodium 141  135 - 145 mEq/L   Potassium 4.0  3.5 - 5.1 mEq/L   Chloride 108  96 - 112 mEq/L   CO2 24  19 - 32 mEq/L   Glucose, Bld 150 (*) 70 - 99 mg/dL   BUN 13  6 - 23 mg/dL   Creatinine, Ser 1.61  0.50 - 1.10 mg/dL   Calcium 8.2 (*) 8.4 - 10.5 mg/dL   GFR calc non Af Amer >90  >90 mL/min   GFR calc Af Amer >90  >90 mL/min   Comment:            The eGFR has been calculated     using the CKD EPI equation.     This calculation has not been     validated in all clinical     situations.     eGFR's persistently     <90 mL/min signify     possible Chronic Kidney Disease.  MAGNESIUM     Status: None   Collection Time    10/13/12  5:00 AM      Result Value Range   Magnesium 1.8  1.5 - 2.5 mg/dL  PHOSPHORUS     Status: None   Collection Time    10/13/12  5:00 AM      Result Value Range   Phosphorus 3.1  2.3 - 4.6 mg/dL  CBC     Status: Abnormal   Collection Time    10/13/12  5:00 AM      Result Value Range   WBC 9.2  4.0 - 10.5  K/uL   RBC 3.47 (*) 3.87 - 5.11 MIL/uL   Hemoglobin 9.6 (*) 12.0 - 15.0 g/dL   HCT 09.6 (*) 04.5 - 40.9 %   MCV 87.0  78.0 - 100.0 fL   MCH 27.7  26.0 - 34.0 pg   MCHC 31.8  30.0 - 36.0 g/dL   RDW 16.1 (*) 09.6 - 04.5 %   Platelets 506 (*) 150 - 400 K/uL  PROCALCITONIN     Status: None   Collection Time    10/13/12  5:00 AM      Result Value Range   Procalcitonin 0.23     Comment:            Interpretation:     PCT (Procalcitonin) <= 0.5 ng/mL:     Systemic infection (sepsis) is not likely.     Local bacterial infection is possible.     (NOTE)             ICU PCT Algorithm               Non ICU PCT Algorithm        ----------------------------     ------------------------------             PCT < 0.25 ng/mL                 PCT < 0.1 ng/mL         Stopping of antibiotics            Stopping of antibiotics           strongly encouraged.               strongly encouraged.        ----------------------------     ------------------------------           PCT level decrease by               PCT < 0.25 ng/mL           >= 80% from peak PCT           OR PCT 0.25 - 0.5 ng/mL          Stopping of antibiotics                                                 encouraged.         Stopping of antibiotics               encouraged.        ----------------------------     ------------------------------           PCT level decrease by              PCT >= 0.25 ng/mL           < 80% from peak PCT            AND PCT >= 0.5 ng/mL            Continuing antibiotics                                                  encouraged.           Continuing antibiotics                encouraged.        ----------------------------     ------------------------------         PCT level increase compared  PCT > 0.5 ng/mL             with peak PCT AND              PCT >= 0.5 ng/mL             Escalation of antibiotics                                              strongly encouraged.          Escalation of  antibiotics            strongly encouraged.  GLUCOSE, CAPILLARY     Status: Abnormal   Collection Time    10/13/12  7:40 AM      Result Value Range   Glucose-Capillary 130 (*) 70 - 99 mg/dL  GLUCOSE, CAPILLARY     Status: Abnormal   Collection Time    10/13/12 11:58 AM      Result Value Range   Glucose-Capillary 133 (*) 70 - 99 mg/dL      Component Value Date/Time   SDES URINE, CATHETERIZED 10/09/2012 2214   SPECREQUEST NONE 10/09/2012 2214   CULT  Value: NO GROWTH Performed at [facility] 10/09/2012 2214   REPTSTATUS 10/10/2012 FINAL 10/09/2012 2214   No results found.   Recent Results (from the past 720 hour(s))  CULTURE, BLOOD (ROUTINE X 2)     Status: None   Collection Time    09/14/12  7:35 AM      Result Value Range Status   Specimen Description BLOOD LEFT HAND   Final   Special Requests BOTTLES DRAWN AEROBIC ONLY 1CC   Final   Culture  Setup Time 09/14/2012 15:16   Final   Culture NO GROWTH 5 DAYS   Final   Report Status 09/20/2012 FINAL   Final  CULTURE, BLOOD (ROUTINE X 2)     Status: None   Collection Time    09/14/12  7:40 AM      Result Value Range Status   Specimen Description BLOOD LEFT HAND   Final   Special Requests BOTTLES DRAWN AEROBIC ONLY 3CC   Final   Culture  Setup Time 09/14/2012 15:14   Final   Culture NO GROWTH 5 DAYS   Final   Report Status 09/20/2012 FINAL   Final  CULTURE, BLOOD (ROUTINE X 2)     Status: None   Collection Time    09/29/12  7:22 PM      Result Value Range Status   Specimen Description BLOOD ARM RIGHT   Final   Special Requests BOTTLES DRAWN AEROBIC ONLY 3CC   Final   Culture  Setup Time 09/30/2012 00:34   Final   Culture NO GROWTH 5 DAYS   Final   Report Status 10/06/2012 FINAL   Final  CULTURE, BLOOD (ROUTINE X 2)     Status: None   Collection Time    09/29/12  8:07 PM      Result Value Range Status   Specimen Description BLOOD HAND RIGHT   Final   Special Requests BOTTLES DRAWN AEROBIC ONLY 2CC   Final   Culture   Setup Time 09/30/2012 00:34   Final   Culture NO GROWTH 5 DAYS   Final   Report Status 10/06/2012 FINAL   Final  MRSA PCR SCREENING     Status:  None   Collection Time    09/29/12 10:17 PM      Result Value Range Status   MRSA by PCR NEGATIVE  NEGATIVE Final   Comment:            The GeneXpert MRSA Assay (FDA     approved for NASAL specimens     only), is one component of a     comprehensive MRSA colonization     surveillance program. It is not     intended to diagnose MRSA     infection nor to guide or     monitor treatment for     MRSA infections.  CULTURE, BLOOD (ROUTINE X 2)     Status: None   Collection Time    10/09/12  4:00 PM      Result Value Range Status   Specimen Description BLOOD ARM RIGHT   Final   Special Requests BOTTLES DRAWN AEROBIC AND ANAEROBIC 10CC   Final   Culture  Setup Time     Final   Value: 10/09/2012 21:56     Performed at [facility]   Culture     Final   Value:        BLOOD CULTURE RECEIVED NO GROWTH TO DATE CULTURE WILL BE HELD FOR 5 DAYS BEFORE ISSUING A FINAL NEGATIVE REPORT     Performed at [facility]   Report Status PENDING   Incomplete  CULTURE, BLOOD (ROUTINE X 2)     Status: None   Collection Time    10/09/12  4:20 PM      Result Value Range Status   Specimen Description BLOOD HAND LEFT   Final   Special Requests BOTTLES DRAWN AEROBIC ONLY 6CC   Final   Culture  Setup Time     Final   Value: 10/09/2012 21:56     Performed at [facility]   Culture     Final   Value:        BLOOD CULTURE RECEIVED NO GROWTH TO DATE CULTURE WILL BE HELD FOR 5 DAYS BEFORE ISSUING A FINAL NEGATIVE REPORT     Performed at [facility]   Report Status PENDING   Incomplete  URINE CULTURE     Status: None   Collection Time    10/09/12 10:14 PM      Result Value Range Status   Specimen Description URINE, CATHETERIZED   Final   Special Requests NONE   Final   Culture  Setup Time     Final   Value: 10/09/2012 23:08      Performed at [facility] Count     Final   Value: NO GROWTH     Performed at [facility]   Culture     Final   Value: NO GROWTH     Performed at [facility]   Report Status 10/10/2012 FINAL   Final  CLOSTRIDIUM DIFFICILE BY PCR     Status: None   Collection Time    10/10/12  1:46 PM      Result Value Range Status   C difficile by pcr NEGATIVE  NEGATIVE Final     Impression/Recommendation Ms. Lariza Cothron is a 57 yo woman with recurrent C. Diff colitis 2/2 poor med compliance who returns back to [facility] after recent discharge. She did not take her vanc or flagyl on d/c and also unclear as to why patient was given dual Rx for C.  Diff coverage as this is not indicated in an out patient setting.   C. Diff colitis: --Her pcr was negative most likely b/c she received 3 doses of Vanc PO prior to pcr draw --Will continue Vanc and Flagyl but will most likely d/c the latter tomorrow and continue just Vanc PO taper up to 6 weeks --Would discontinue any PPI she may be on at home --Will get her in touch with the stool transplant team after this episode is aborted, as she would benefit from this greatly given her history and compliance issues.   Thank you so much for this interesting consult  [facility] [phone] (pager) [phone] (office) 10/13/2012, 3:17 PM  Lewie Chamber 10/13/2012, 3:17 PM

## 2012-10-13 NOTE — Progress Notes (Signed)
PULMONARY  / CRITICAL CARE MEDICINE  Name: [name] MRN: [id number] DOB: Oct 02, 1955    ADMISSION DATE:  10/09/2012 CONSULTATION DATE:  10/09/2012  REFERRING MD :  Vivia Ewing MD PRIMARY SERVICE: PCCM  CHIEF COMPLAINT:  Diarrhea  BRIEF PATIENT DESCRIPTION: 57 y/o female with a history of recurrent C.diff returned to the [facility] ED on 8/7 with confusion, diarrhea, subjective fevers, chills, and abdominal pain.  Found to be hypotensive with AKI.  BP initially improved with IVF in ED, but then shock. Admitted to 2100.    SIGNIFICANT EVENTS / STUDIES:  8/7 CXR > CVL in place, lungs clear 8/7 KUB > no obstruction, thick haustra consistent with colitis  LINES / TUBES: 8/7 R IJ CVL >>  CULTURES: 8/7 blood >> 8/7 urine >>negative  CDiff 8/8>>> negative  ANTIBIOTICS: 8/7 flagyl >>> 8/7 vanc >>>  SUBJECTIVE/OVERNIGHT:  Labile HR and BP. Very anxious.   VITAL SIGNS: Temp:  [97.9 F (36.6 C)-98.7 F (37.1 C)] 98 F (36.7 C) (08/11 0741) Pulse Rate:  [44-150] 114 (08/11 0300) Resp:  [14-27] 16 (08/11 0300) BP: (78-126)/(44-103) 90/57 mmHg (08/11 0300) SpO2:  [89 %-100 %] 99 % (08/11 0300) Weight:  [257 lb (116.574 kg)] 257 lb (116.574 kg) (08/11 0500) HEMODYNAMICS: CVP:  [16 mmHg] 16 mmHg VENTILATOR SETTINGS:   INTAKE / OUTPUT: Intake/Output     08/10 0701 - 08/11 0700 08/11 0701 - 08/12 0700   P.O. 560    I.V. (mL/kg) 2000 (17.2)    Other 20    Total Intake(mL/kg) 2580 (22.1)    Urine (mL/kg/hr) 1935 (0.7)    Stool 200 (0.1)    Total Output 2135     Net +445          Stool Occurrence 1 x      PHYSICAL EXAMINATION: Gen: chronically ill appearing, obese HEENT: no jvd PULM: coarse CV:  s1 s2 rrt AB: BS+, soft, nontender, no hsm Ext: warm, 2+ edema, Derm: no rash or skin breakdown Neuro: awake, alert, appropriate, MAE   LABS:  CBC  Recent Labs Lab 10/11/12 0440 10/12/12 0400 10/13/12 0500  HGB 9.4* 9.0* 9.6*  HCT 29.6* 27.9* 30.2*  WBC 9.4 6.9 9.2  PLT  549* 482* 506*    Coag's No results found for this basename: APTT, INR,  in the last 72 hours  BMET  Recent Labs Lab 10/09/12 2313 10/10/12 0453 10/11/12 0440 10/12/12 0400 10/13/12 0500  NA 133* 130* 136 136 141  K 4.7 4.4 4.2 3.8 4.0  CL 98 97 103 104 108  CO2 25 21 22 26 24   GLUCOSE 106* 154* 168* 153* 150*  BUN 49* 48* 35* 20 13  CREATININE 2.32* 2.10* 1.12* 0.69 0.66  CALCIUM 8.5 7.7* 8.2* 8.2* 8.2*  MG  --  2.1 2.1  --  1.8  PHOS  --  6.0* 3.7  --  3.1    Electrolytes Recent Labs     10/11/12  0440  10/12/12  0400  10/13/12  0500  CALCIUM  8.2*  8.2*  8.2*  MG  2.1   --   1.8  PHOS  3.7   --   3.1   Sepsis Markers Recent Labs     10/13/12  0500  PROCALCITON  0.23    ABG  Recent Labs Lab 10/09/12 2241 10/09/12 2251 10/10/12 0156 10/10/12 1554 10/11/12 0415  PHART  --  7.161* 7.248* 7.259* 7.289*  PCO2ART  --  66.3* 50.1* 49.6* 45.1*  PO2ART  --  88.0 108.0* 81.0 129.0*  HCO3 24.3* 23.7 21.9 22.3 20.9  TCO2 26 26 23 24  22.3  O2SAT 59.0 93.0 97.0 94.0 98.7   Liver Enzymes  Recent Labs Lab 10/09/12 1540  AST 14  ALT 9  ALKPHOS 34*  BILITOT 0.2*  PROT 3.2*  ALBUMIN 1.3*    Cardiac Enzymes Recent Labs     10/10/12  1119  TROPONINI  <0.30   Glucose Recent Labs     10/12/12  0734  10/12/12  1134  10/12/12  1529  10/12/12  1945  10/13/12  0014  10/13/12  0411  GLUCAP  139*  113*  162*  113*  131*  132*     ASSESSMENT / PLAN:   PULMONARY A: COPD, not in acute exacerbation Mixed acidosis P:   -O2 as needed -continue home pulmicort and duoneb -consider symbicort or other combination inhaler (LABA/ICS) at discharge -prn albuterol -suspect OSA/OHS - refused cpa / bipap  CARDIOVASCULAR A: Hypovolemic shock > improved with IVF; normal lactic acid reassuring.  Pressors off 8/9 Tachycardia - sinus vs A fib CVP=12-15 P:  -push more volume to meet losses from diarrhea -likely some degree narcotic withdrawal - will add  back PRN home pain meds  -try metoprolol for rate control, initially IV. She is allergic to diltiazem.  - Also Metoprolol prn added - SQ heparin for DVT prophylaxis -control pain -ecg now -replace mag  RENAL A:  AKI due to hypovolemia >>> Resolved  P:   -replace mag with tachy -continue pos balance -may need to change to 1/2 NS, follow cl  GASTROINTESTINAL A:  Hx Recurrent C.diff, now with chronic colitis and C diff PCR negative Diarrhea  R/o rare false neg P:   -see ID -??etiology of diarrhea if CDiff truly neg  -consider GI input, BX this, will guide therapy, if pos cdiff , and this is failure to vanc taper = stool Tx -ID and gi needed - with such noncompliance = stool Tx -clear diet -change flagyl to IV  HEMATOLOGIC A:  Mild anemia  P:  -monitor for bleeding on sub q hep  INFECTIOUS A:  Recurrent C.diff; CDiff neg this admit   P:   -cont empiric Vanc/Flagyl per ID recs as noted in 8/2 D/C summary -change flagyl to IV -ID consuklt -consider bx , call GI -if not cdiff, add probiotic  ENDOCRINE A:  DM2 P:   -SSI/monitor glucose  NEUROLOGIC A:  Delirium due to infection/AKI/hypovolemia; similar description to previous admissions - resolving P:   -no sedating meds (she is on home narc regimen) -frequent orientation  Summary: bolus, call ID, GI, continue empiric treatment cdiff, treat tachy, ecg  Mcarthur Rossetti. Tyson Alias, MD, FACP Pgr: [phone] [facility]

## 2012-10-13 NOTE — Consult Note (Signed)
INFECTIOUS DISEASE ATTENDING ADDENDUM:     [facility]   Date: 10/13/2012  Patient name: [name]  Medical record number: [id number]  Date of birth: 08/11/1955    This patient has been seen and discussed with the house staff. Please see their note for complete details. I concur with their findings with the following additions/corrections:  Patient with recurrent CDI in part due to noncompliance with medications.  WILL GIVE HER VANCOMYCIN PULSE AND TAPER PER PROTOCOL AND WOULD TWEAK TO MAKE SURE FIRST TWO WEEKS ARE QID BEFORE NEXT STEP  WOULD DC PEPCID  OK TO DC FLAGYL IN THE AM  COULD GIVE HER FLORASTOR IF SHE CAN AFFORD IT  WILL TRY TO GET HER PLUGGED INTO DR. Drue Second AND DR. Marvell Fuller PROJECT INVOLVING STOOL TRANSPLANTATION THOUGH THIS PT WITH POOR COMPLIANCE AND COPD WITH FREQUENT RECURRENCES AND LIKELY FREQUENT ABX EXPOSURE IS AT HIGH RISK OF FAILNG STOOL TRANSPLANT AS WELL  WILL ALSO SCREEN FOR HIV, HEP C  Acey Lav 10/13/2012, 4:45 PM

## 2012-10-13 NOTE — Consult Note (Signed)
Subjective:   HPI  The patient is a 57 year old female who was readmitted to the hospital with diarrhea and abdominal pain. 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 She has been treated for this in the past. On leaving the hospital however she has been apparently noncompliant and has not gotten her medications and therefore did not take her prescribed followup medications for the C. Difficile. She continues to have diarrhea. There is no report of blood in the diarrhea. She is being seen by the infectious disease team. She is being seen by critical care. We were asked to see her by the critical care team because her C. Difficile PCR was negative, and they wondered whether or not doing a flexible sigmoidoscopy with biopsy might be of benefit to help with the diagnosis and treatment.     Past Medical History  Diagnosis Date  . COPD (chronic obstructive pulmonary disease)   . Respiratory failure   . Morbid obesity   . Tobacco abuse   . GERD (gastroesophageal reflux disease)   . Hyperlipidemia   . Chronic pain   . Hypertension   . Heart attack   . Arthritis   . Allergic rhinitis   . Diabetes mellitus, type 2   . Diabetes mellitus type 2 in obese 02/29/2012  . HTN (hypertension) 02/29/2012  . C. difficile diarrhea 09/2012  . Chronic kidney disease 09/2012    acute renal failure  . Shock 09/2012   Past Surgical History  Procedure Laterality Date  . Cholecystectomy    . Partial hysterectomy    . Angioplasty    . Umbilical hernia repair  12/2009   History   Social History  . Marital Status: Married    Spouse Name: N/A    Number of Children: 1  . Years of Education: N/A   Occupational History  . Environmental education officer   Social History Main Topics  . Smoking status: Former Smoker -- 1.00 packs/day for 40 years    Types: Cigarettes    Quit date: 03/04/2012  . Smokeless tobacco: Never Used  . Alcohol Use: No  . Drug Use: No  . Sexually  Active: Not on file   Other Topics Concern  . Not on file   Social History Narrative  . No narrative on file   family history is not on file. Current facility-administered medications:0.45 % sodium chloride infusion, , Intravenous, Continuous, Nelda Bucks, MD, Last Rate: 100 mL/hr at 10/13/12 1154;  albuterol (PROVENTIL) (5 MG/ML) 0.5% nebulizer solution 2.5 mg, 2.5 mg, Nebulization, Q4H PRN, Leslye Peer, MD;  antiseptic oral rinse (BIOTENE) solution 15 mL, 15 mL, Mouth Rinse, q12n4p, Konstantin Zubelevitskiy, MD, 15 mL at 10/13/12 1600 budesonide (PULMICORT) nebulizer solution 0.25 mg, 0.25 mg, Nebulization, BID, Lonia Farber, MD, 0.25 mg at 10/12/12 2052;  chlorhexidine (PERIDEX) 0.12 % solution 15 mL, 15 mL, Mouth Rinse, BID, Lonia Farber, MD, 15 mL at 10/13/12 1610;  gabapentin (NEURONTIN) capsule 100 mg, 100 mg, Oral, TID, Nelda Bucks, MD, 100 mg at 10/13/12 1528 heparin injection 5,000 Units, 5,000 Units, Subcutaneous, Q8H, Lonia Farber, MD, 5,000 Units at 10/13/12 1500;  HYDROcodone-acetaminophen (NORCO) 7.5-325 MG per tablet 1 tablet, 1 tablet, Oral, Q4H PRN, Bernadene Person, NP, 1 tablet at 10/13/12 0448;  insulin aspart (novoLOG) injection 0-15 Units, 0-15 Units, Subcutaneous, Q4H, Lonia Farber, MD, 2 Units at 10/13/12 1200 LORazepam (ATIVAN) injection 1 mg, 1 mg, Intravenous, Q4H PRN, Lonia Farber, MD,  1 mg at 10/12/12 2142;  metoprolol (LOPRESSOR) injection 5 mg, 5 mg, Intravenous, Q6H PRN, Lonia Farber, MD, 5 mg at 10/13/12 1132;  metoprolol tartrate (LOPRESSOR) tablet 25 mg, 25 mg, Oral, BID, Leslye Peer, MD, 25 mg at 10/13/12 1137 metroNIDAZOLE (FLAGYL) IVPB 500 mg, 500 mg, Intravenous, Q8H, Nelda Bucks, MD, 500 mg at 10/13/12 1528;  rOPINIRole (REQUIP) tablet 1 mg, 1 mg, Oral, QHS, Nelda Bucks, MD;  sertraline (ZOLOFT) tablet 50 mg, 50 mg, Oral, Daily, Nelda Bucks, MD, 50  mg at 10/13/12 1153;  vancomycin (VANCOCIN) 50 mg/mL oral solution 500 mg, 500 mg, Oral, Q6H, Lonia Farber, MD, 500 mg at 10/13/12 1528 Allergies  Allergen Reactions  . Ambien (Zolpidem Tartrate) Other (See Comments)    Severe hallucinations and behavior changes reported by family  . Cardizem (Diltiazem Hcl) Swelling  . Lunesta (Eszopiclone) Other (See Comments)    Nightmares & hallucinations  . Metformin And Related Swelling  . Protonix (Pantoprazole Sodium) Other (See Comments)    Unknown reaction-"Doctor told me to never take it"     Objective:     BP 123/72  Pulse 83  Temp(Src) 97.4 F (36.3 C) (Oral)  Resp 18  Ht 5\' 5"  (1.651 m)  Wt 116.574 kg (257 lb)  BMI 42.77 kg/m2  SpO2 98%  Heart regular rhythm  Lungs clear  Abdomen is soft and nontender  Laboratory No components found with this basename: d1      Assessment:     Recurrent diarrhea most likely secondary to Clostridium difficile      Plan:     I spoke to Dr. Synthia Innocent from infectious disease and asked him if it would be of benefit to him to scope this patient and obtain biopsies. He did not feel that it would help in her management or change her management at this time. We will therefore not proceed with flexible sigmoidoscopy and biopsy.  Subjective:   HPI  See above     Past Medical History  Diagnosis Date  . COPD (chronic obstructive pulmonary disease)   . Respiratory failure   . Morbid obesity   . Tobacco abuse   . GERD (gastroesophageal reflux disease)   . Hyperlipidemia   . Chronic pain   . Hypertension   . Heart attack   . Arthritis   . Allergic rhinitis   . Diabetes mellitus, type 2   . Diabetes mellitus type 2 in obese 02/29/2012  . HTN (hypertension) 02/29/2012  . C. difficile diarrhea 09/2012  . Chronic kidney disease 09/2012    acute renal failure  . Shock 09/2012   Past Surgical History  Procedure Laterality Date  . Cholecystectomy    . Partial hysterectomy     . Angioplasty    . Umbilical hernia repair  12/2009   History   Social History  . Marital Status: Married    Spouse Name: N/A    Number of Children: 1  . Years of Education: N/A   Occupational History  . Environmental education officer   Social History Main Topics  . Smoking status: Former Smoker -- 1.00 packs/day for 40 years    Types: Cigarettes    Quit date: 03/04/2012  . Smokeless tobacco: Never Used  . Alcohol Use: No  . Drug Use: No  . Sexually Active: Not on file   Other Topics Concern  . Not on file   Social History Narrative  . No narrative on file  family history is not on file. Current facility-administered medications:0.45 % sodium chloride infusion, , Intravenous, Continuous, Nelda Bucks, MD, Last Rate: 100 mL/hr at 10/13/12 1154;  albuterol (PROVENTIL) (5 MG/ML) 0.5% nebulizer solution 2.5 mg, 2.5 mg, Nebulization, Q4H PRN, Leslye Peer, MD;  antiseptic oral rinse (BIOTENE) solution 15 mL, 15 mL, Mouth Rinse, q12n4p, Konstantin Zubelevitskiy, MD, 15 mL at 10/13/12 1600 budesonide (PULMICORT) nebulizer solution 0.25 mg, 0.25 mg, Nebulization, BID, Lonia Farber, MD, 0.25 mg at 10/12/12 2052;  chlorhexidine (PERIDEX) 0.12 % solution 15 mL, 15 mL, Mouth Rinse, BID, Lonia Farber, MD, 15 mL at 10/13/12 1610;  gabapentin (NEURONTIN) capsule 100 mg, 100 mg, Oral, TID, Nelda Bucks, MD, 100 mg at 10/13/12 1528 heparin injection 5,000 Units, 5,000 Units, Subcutaneous, Q8H, Lonia Farber, MD, 5,000 Units at 10/13/12 1500;  HYDROcodone-acetaminophen (NORCO) 7.5-325 MG per tablet 1 tablet, 1 tablet, Oral, Q4H PRN, Bernadene Person, NP, 1 tablet at 10/13/12 0448;  insulin aspart (novoLOG) injection 0-15 Units, 0-15 Units, Subcutaneous, Q4H, Lonia Farber, MD, 2 Units at 10/13/12 1200 LORazepam (ATIVAN) injection 1 mg, 1 mg, Intravenous, Q4H PRN, Lonia Farber, MD, 1 mg at 10/12/12 2142;  metoprolol (LOPRESSOR)  injection 5 mg, 5 mg, Intravenous, Q6H PRN, Lonia Farber, MD, 5 mg at 10/13/12 1132;  metoprolol tartrate (LOPRESSOR) tablet 25 mg, 25 mg, Oral, BID, Leslye Peer, MD, 25 mg at 10/13/12 1137 metroNIDAZOLE (FLAGYL) IVPB 500 mg, 500 mg, Intravenous, Q8H, Nelda Bucks, MD, 500 mg at 10/13/12 1528;  rOPINIRole (REQUIP) tablet 1 mg, 1 mg, Oral, QHS, Nelda Bucks, MD;  sertraline (ZOLOFT) tablet 50 mg, 50 mg, Oral, Daily, Nelda Bucks, MD, 50 mg at 10/13/12 1153;  vancomycin (VANCOCIN) 50 mg/mL oral solution 500 mg, 500 mg, Oral, Q6H, Lonia Farber, MD, 500 mg at 10/13/12 1528 Allergies  Allergen Reactions  . Ambien (Zolpidem Tartrate) Other (See Comments)    Severe hallucinations and behavior changes reported by family  . Cardizem (Diltiazem Hcl) Swelling  . Lunesta (Eszopiclone) Other (See Comments)    Nightmares & hallucinations  . Metformin And Related Swelling  . Protonix (Pantoprazole Sodium) Other (See Comments)    Unknown reaction-"Doctor told me to never take it"     Objective:     BP 123/72  Pulse 83  Temp(Src) 97.4 F (36.3 C) (Oral)  Resp 18  Ht 5\' 5"  (1.651 m)  Wt 116.574 kg (257 lb)  BMI 42.77 kg/m2  SpO2 98%  See above  Laboratory No components found with this basename: d1      Assessment:     See above      Plan:     See above

## 2012-10-14 ENCOUNTER — Encounter (HOSPITAL_COMMUNITY)
Admission: EM | Disposition: A | Discharge status: Skilled Nursing Facility | Payer: Self-pay | Source: Home / Self Care | Attending: Pulmonary Disease

## 2012-10-14 ENCOUNTER — Encounter (HOSPITAL_COMMUNITY): Payer: Self-pay | Admitting: Nurse Practitioner

## 2012-10-14 DIAGNOSIS — I480 Paroxysmal atrial fibrillation: Secondary | ICD-10-CM | POA: Diagnosis present

## 2012-10-14 DIAGNOSIS — I959 Hypotension, unspecified: Secondary | ICD-10-CM

## 2012-10-14 LAB — BASIC METABOLIC PANEL
BUN: 9 mg/dL (ref 6–23)
Chloride: 107 mEq/L (ref 96–112)
GFR calc Af Amer: >90 mL/min (ref >90)
GFR calc non Af Amer: >90 mL/min (ref >90)
Potassium: 3.9 mEq/L (ref 3.5–5.1)
Sodium: 140 mEq/L (ref 135–145)

## 2012-10-14 LAB — CBC
HCT: 29.9 % — ABNORMAL LOW (ref 36.0–46.0)
Hemoglobin: 9.2 g/dL — ABNORMAL LOW (ref 12.0–15.0)
MCHC: 30.8 g/dL (ref 30.0–36.0)
RDW: 19.2 % — ABNORMAL HIGH (ref 11.5–15.5)
WBC: 8 10*3/uL (ref 4.0–10.5)

## 2012-10-14 LAB — GLUCOSE, CAPILLARY
Glucose-Capillary: 126 mg/dL — ABNORMAL HIGH (ref 70–99)
Glucose-Capillary: 123 mg/dL — ABNORMAL HIGH (ref 70–99)

## 2012-10-14 LAB — HEPATITIS C ANTIBODY (REFLEX): HCV Ab: NEGATIVE

## 2012-10-14 SURGERY — SIGMOIDOSCOPY, FLEXIBLE
Anesthesia: Moderate Sedation

## 2012-10-14 MED ORDER — ENOXAPARIN SODIUM 120 MG/0.8ML ~~LOC~~ SOLN
1.0000 mg/kg | Freq: Two times a day (BID) | SUBCUTANEOUS | Status: DC
  Administered 2012-10-14 – 2012-10-15 (×3): 115 mg via SUBCUTANEOUS
  Filled 2012-10-14 (×4): qty 0.8

## 2012-10-14 MED ORDER — METOPROLOL TARTRATE 25 MG PO TABS
25.0000 mg | ORAL_TABLET | Freq: Two times a day (BID) | ORAL | Status: DC
  Administered 2012-10-14 (×2): 25 mg via ORAL
  Filled 2012-10-14 (×4): qty 1

## 2012-10-14 MED ORDER — AMIODARONE HCL 200 MG PO TABS
400.0000 mg | ORAL_TABLET | Freq: Two times a day (BID) | ORAL | Status: DC
  Administered 2012-10-14: 400 mg via ORAL
  Filled 2012-10-14 (×3): qty 2

## 2012-10-14 MED ORDER — INSULIN ASPART 100 UNIT/ML ~~LOC~~ SOLN
0.0000 [IU] | Freq: Three times a day (TID) | SUBCUTANEOUS | Status: DC
  Administered 2012-10-15 (×2): 3 [IU] via SUBCUTANEOUS
  Administered 2012-10-15: 2 [IU] via SUBCUTANEOUS
  Administered 2012-10-16 – 2012-10-18 (×6): 3 [IU] via SUBCUTANEOUS
  Administered 2012-10-18: 2 [IU] via SUBCUTANEOUS
  Administered 2012-10-18 – 2012-10-19 (×3): 3 [IU] via SUBCUTANEOUS
  Administered 2012-10-20: 5 [IU] via SUBCUTANEOUS

## 2012-10-14 NOTE — Progress Notes (Addendum)
[facility] Progress Note [place] TEAM 1 - Stepdown/ICU TEAM   MACKENZI KROGH WUJ:[id number] DOB: 06-Jun-1955 DOA: 10/09/2012 PCP: [name], NP  Brief narrative: This is a 57 y/o female who presented on 8/7 with confusion, diarrhea, abd pain.   She was hospitalized last December with ARDS from influenza and probable secondary pneumococcal pneumonia. For the tail end of her admission (in Jan) she developed C. difficile colitis. It appears she was treated with oral metronidazole. She was transferred to [facility] for ventilator weaning and rehab. She received a full course of treatment but continued to have diarrhea after she was discharged home (2/14).   She returned to the hospital on 7/8 for diarrhea and hypotension- transferred to ICU on 7/9 for persisent hypotension and resp failure - intubated and placed on pressors. Found to have C diff colitis again. - discharged on 7/15 on PO Vanc.   She did not fill her medications after this discharge due to cost.  She was re-admitted on 7/28, treated again for C Diff and discharged on 8/2. This time had social services arrange outpt Vanc.  She was re-admitted on 8/7 for diarrhea and hypotension. However, this time,  she states she was taking her medications appropriately (Vanc taper and Flagyl). She complained that she had to wait a long time at the pharmacy to Pick up the Vanc but did obtain her meds and take them appropriately.    Assessment/Plan: Principal Problem:   Recurrent resistant Clostridium difficile diarrhea-  - management per ID - still having watery stool - has flexiseal - cont IV hydration  Active Problems:   COPD (chronic obstructive pulmonary disease) - on home O2 - stable    Obesity    Diabetes mellitus type 2  - Takes 32 U of Lantus which is on hold  - moderate dose sliding scale- sugars are stable at this low dose of insulin    Atrial fibrillation and A- flutter- new onset - apparently allergic to  Cardizem per EPIC but pt states she has no allergy - have consulted Cardiology for further assistance - ECHO 1/3- EF 50-55%,  elevated pulm pressure and RV dilation- atrial were normal in size -TSH pending - full dose Lovenox started   Code Status: full  Family Communication: none Disposition Plan: follow in SDU- cont flexiseal due to watery stool today- avoid OOB for now due to A-fib with RVR  Consultants: ID    Antibiotics: 8/7 flagyl >>>  8/7 vanc >>>   DVT prophylaxis: Full dose lovenox  HPI/Subjective:  Pt wanting flexiseal and foley out and wanting to ambulate- states she fees well.    Objective: Blood pressure 82/61, pulse 122, temperature 97.6 F (36.4 C), temperature source Axillary, resp. rate 13, height 5\' 5"  (1.651 m), weight 116.574 kg (257 lb), SpO2 99.00%.  Intake/Output Summary (Last 24 hours) at 10/14/12 1125 Last data filed at 10/14/12 1000  Gross per 24 hour  Intake   2764 ml  Output   1555 ml  Net   1209 ml     Exam: General: AAO x 3, No acute respiratory distress Lungs: Clear to auscultation bilaterally without wheezes or crackles- on 4 L O2 Cardiovascular: Iregular rate and rhythm without murmur gallop or rub normal S1 and S2 Abdomen: Nontender, mildly distended, soft, bowel sounds positive, no rebound, no ascites, no appreciable mass Extremities: No significant cyanosis, clubbing- significant edema bilateral lower extremities  Data Reviewed: Basic Metabolic Panel:  Recent Labs Lab 10/10/12 0453 10/11/12  0440 10/12/12 0400 10/13/12 0500 10/14/12 0430  NA 130* 136 136 141 140  K 4.4 4.2 3.8 4.0 3.9  CL 97 103 104 108 107  CO2 21 22 26 24 25   GLUCOSE 154* 168* 153* 150* 126*  BUN 48* 35* 20 13 9   CREATININE 2.10* 1.12* 0.69 0.66 0.60  CALCIUM 7.7* 8.2* 8.2* 8.2* 8.2*  MG 2.1 2.1  --  1.8  --   PHOS 6.0* 3.7  --  3.1  --    Liver Function Tests:  Recent Labs Lab 10/09/12 1540  AST 14  ALT 9  ALKPHOS 34*  BILITOT 0.2*   PROT 3.2*  ALBUMIN 1.3*   No results found for this basename: LIPASE, AMYLASE,  in the last 168 hours No results found for this basename: AMMONIA,  in the last 168 hours CBC:  Recent Labs Lab 10/09/12 1540  10/10/12 0453 10/11/12 0440 10/12/12 0400 10/13/12 0500 10/14/12 0430  WBC 9.3  < > 8.2 9.4 6.9 9.2 8.0  NEUTROABS 7.4  --   --   --   --   --   --   HGB 7.4*  < > 8.9* 9.4* 9.0* 9.6* 9.2*  HCT 23.0*  < > 28.5* 29.6* 27.9* 30.2* 29.9*  MCV 86.8  < > 88.0 85.8 85.1 87.0 88.5  PLT 385  < > 466* 549* 482* 506* 486*  < > = values in this interval not displayed. Cardiac Enzymes:  Recent Labs Lab 10/09/12 2341 10/10/12 0453 10/10/12 1119  TROPONINI <0.30 <0.30 <0.30   BNP (last 3 results)  Recent Labs  03/23/12 0612 03/27/12 0627 09/12/12 0508  PROBNP 184.7* 100.1 25.8   CBG:  Recent Labs Lab 10/13/12 1158 10/13/12 1622 10/13/12 1955 10/14/12 0037 10/14/12 0348  GLUCAP 133* 154* 140* 148* 123*    Recent Results (from the past 240 hour(s))  CULTURE, BLOOD (ROUTINE X 2)     Status: None   Collection Time    10/09/12  4:00 PM      Result Value Range Status   Specimen Description BLOOD ARM RIGHT   Final   Special Requests BOTTLES DRAWN AEROBIC AND ANAEROBIC 10CC   Final   Culture  Setup Time     Final   Value: 10/09/2012 21:56     Performed at [facility]   Culture     Final   Value:        BLOOD CULTURE RECEIVED NO GROWTH TO DATE CULTURE WILL BE HELD FOR 5 DAYS BEFORE ISSUING A FINAL NEGATIVE REPORT     Performed at [facility]   Report Status PENDING   Incomplete  CULTURE, BLOOD (ROUTINE X 2)     Status: None   Collection Time    10/09/12  4:20 PM      Result Value Range Status   Specimen Description BLOOD HAND LEFT   Final   Special Requests BOTTLES DRAWN AEROBIC ONLY 6CC   Final   Culture  Setup Time     Final   Value: 10/09/2012 21:56     Performed at [facility]   Culture     Final   Value:        BLOOD CULTURE  RECEIVED NO GROWTH TO DATE CULTURE WILL BE HELD FOR 5 DAYS BEFORE ISSUING A FINAL NEGATIVE REPORT     Performed at [facility]   Report Status PENDING   Incomplete  URINE CULTURE     Status: None  Collection Time    10/09/12 10:14 PM      Result Value Range Status   Specimen Description URINE, CATHETERIZED   Final   Special Requests NONE   Final   Culture  Setup Time     Final   Value: 10/09/2012 23:08     Performed at [facility]   Colony Count     Final   Value: NO GROWTH     Performed at [facility]   Culture     Final   Value: NO GROWTH     Performed at [facility]   Report Status 10/10/2012 FINAL   Final  CLOSTRIDIUM DIFFICILE BY PCR     Status: None   Collection Time    10/10/12  1:46 PM      Result Value Range Status   C difficile by pcr NEGATIVE  NEGATIVE Final     Studies:  Recent x-ray studies have been reviewed in detail by the Attending Physician  Scheduled Meds:  Scheduled Meds: . antiseptic oral rinse  15 mL Mouth Rinse q12n4p  . budesonide  0.25 mg Nebulization BID  . chlorhexidine  15 mL Mouth Rinse BID  . gabapentin  100 mg Oral TID  . insulin aspart  0-15 Units Subcutaneous Q4H  . metoprolol tartrate  25 mg Oral BID  . metronidazole  500 mg Intravenous Q8H  . rOPINIRole  1 mg Oral QHS  . saccharomyces boulardii  250 mg Oral BID  . sertraline  50 mg Oral Daily  . vancomycin  500 mg Oral QID   Followed by  . [START ON 10/27/2012] vancomycin  125 mg Oral BID   Followed by  . [START ON 11/04/2012] vancomycin  125 mg Oral Daily   Followed by  . [START ON 11/11/2012] vancomycin  125 mg Oral QODAY   Followed by  . [START ON 11/19/2012] vancomycin  125 mg Oral Q3 days   Continuous Infusions: . sodium chloride 100 mL/hr at 10/13/12 1154    Time spent on care of this patient: 55 min   Luisenrique Conran, MD  [facility] Office  [phone] Pager - Text Page per Loretha Stapler as per below:  On-Call/Text Page:       [email or web address]      password [email or web address]  If 7PM-7AM, please contact night-coverage [email or web address] Password [facility] 10/14/2012, 11:25 AM   LOS: 5 days

## 2012-10-14 NOTE — Progress Notes (Signed)
INFECTIOUS DISEASE ATTENDING ADDENDUM:     [facility]   Date: 10/14/2012  Patient name: [name]  Medical record number: [id number]  Date of birth: 1955-09-27    This patient has been seen and discussed with the house staff. Please see their note for complete details. I concur with their findings with the following additions/corrections:  Patient looks better today and we will dc IV flagyl  She told  Dr. Butler Denmark that she had in fact been taking her vancomycin and flagyl as prescribed.  THIS WAS CONTRARY to what she had told us yesterday when she spoke to my student Lewie Chamber, and Micah Flesher in ICU. Given she was still quite ill and recovering so it is possible that she was not a "reliable historian"  She could describe picking up the vancomycin from [facility] after [facility]. I asked her how she took it and she told me "whatever was on the bottle"  I remain skeptical that she was TRULY 100% compliant with her vancomycin doses and I expect the rx for TID flagyl on top of the vancomycin was a recipe to lead for further confusion and imperfect compliance.  If one felt that this admission was truly due to a "vancomycin failure" we could consider giving her fidaxomicin.   There is data that there is less relapse in patients after 1st or 2nd recurrence with fidaxomicin over vancomycin HOWEVER I am not aware of any data on patients who have had MULTIPLE episodes such as this pt with what would be her FOURTH in 7 months.   Furthermore I think it is HIGHLY UNLIKELY that one would be able to count on Medicaid paying for this 2000 dollar rx at dc for this pt and resorting to this for her as an outpatient measure is liklely another way to set her up for failure.   Currently she seems to be responding well to HIGH dose oral vancomycin and Iv flagyl  I will dc the IV flagyl and have her forge on with 2  weeks of high dose 500mg  po qid vancomycin followed by the taper as outlined in vancomycin order set  I will refer her to Dr. Drue Second who has a colloboration with Dr. Leone Payor for stool transplantation for pts with recurrent CDI.     Acey Lav 10/14/2012, 6:40 PM

## 2012-10-14 NOTE — Progress Notes (Signed)
Nurse text paged MD to gain clarity regarding active CVP order, currently patient is not set up for CVP, pt recently transferred to 2600 without CVP.  Previous unit not measuring CVP.  Additionally, nurse requested parameters regarding PRN orders for lopressor prior to administration; current HR ranging 110's -140's.

## 2012-10-14 NOTE — ED Provider Notes (Signed)
I performed a history and physical examination of  [name] and discussed her management with Dr. Lew Dawes. I agree with the history, physical, assessment, and plan of care, with the following exceptions: None  I was present for the following procedures: Central line  Time Spent in Critical Care of the patient: 75 minutes  Time spent in discussions with the patient and family: 45 minutes  Icholas Irby  CRITICAL CARE Performed by: Derwood Kaplan   Total critical care time: 75 minutes  Critical care time was exclusive of separately billable procedures and treating other patients.  Critical care was necessary to treat or prevent imminent or life-threatening deterioration.  Critical care was time spent personally by me on the following activities: development of treatment plan with patient and/or surrogate as well as nursing, discussions with consultants, evaluation of patient's response to treatment, examination of patient, obtaining history from patient or surrogate, ordering and performing treatments and interventions, ordering and review of laboratory studies, ordering and review of radiographic studies, pulse oximetry and re-evaluation of patient's condition.  Pt comes in with hypotension, diarrhea, slight AMS. Fluid resuscitations done. It appears that patient is third spacing. She also has significant acidosis. CCM to admit.   Derwood Kaplan, MD 10/14/12 [phone]

## 2012-10-14 NOTE — Consult Note (Signed)
CARDIOLOGY CONSULT NOTE  Patient ID: PORTLAND SARINANA MRN: [id number], DOB/AGE: 05/15/1955   Admit date: 10/09/2012 Date of Consult: 10/14/2012  Primary Physician: [name], NP Primary Cardiologist: new  Pt. Profile  57 y/o female with h/o coronary vasospasm who was readmitted 8/7 with recurrent C. Diff, whom we've been asked to eval 2/2 tachycardia.  Problem List  Past Medical History  Diagnosis Date  . COPD (chronic obstructive pulmonary disease)   . Chronic respiratory failure   . Morbid obesity   . Tobacco abuse   . GERD (gastroesophageal reflux disease)   . Hyperlipidemia   . Chronic pain   . Hypertension   . Coronary vasospasm     a. 03/2004 NSTEMI/Cath: EF 45%, LM nl, LAD nl, D1 nl, D2 nl, LCX nl, OM1 large-nl, RCA-spasm noted, otw nl.  . Arthritis   . Allergic rhinitis   . Diabetes mellitus type 2 in obese 02/29/2012  . HTN (hypertension) 02/29/2012  . 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 recurrent in setting of abx noncompliance.  . Chronic kidney disease 09/2012    acute renal failure  . Shock 09/2012    Past Surgical History  Procedure Laterality Date  . Cholecystectomy    . Partial hysterectomy    . Angioplasty    . Umbilical hernia repair  12/2009    Allergies  Allergies  Allergen Reactions  . Ambien (Zolpidem Tartrate) Other (See Comments)    Severe hallucinations and behavior changes reported by family  . Ciprofloxacin     PATIENT AT VERY HIGH RISK FOR C DIFFICILE COLITIS  . Cardizem (Diltiazem Hcl) Swelling  . Lunesta (Eszopiclone) Other (See Comments)    Nightmares & hallucinations  . Metformin And Related Swelling  . Protonix (Pantoprazole Sodium) Other (See Comments)    Unknown reaction-"Doctor told me to never take it"   HPI   57 y/o female with a h/o NSTEMI in 03/2004, with cath @ that time showing RCA vasospasm.  She has had a complex year, initially being admitted in 02/2012 2/2 H1N1 with resultant ARDS, complicated by seconary  pneumococcal pna and prolonged intubation/tracheostomy requiring admission to [facility].  She was discharged in early Spring and following d/c had persistent foul smelling diarrhea.  She was admitted 7/8 with C diff complicated by hypotension, sepsis, and renal failure.  Following a 1 week hospital stay, she was discharged on antibiotics with plan for outpt ID f/u.  Unfortunately, she was not able to retrieve her meds from the pharmacy for some reason and she presented back to [facility] on 7/28 with persistent diarrhea, hypotension, and renal failure.  Antibiotics were resumed and diarrhea gradually improved, as did renal fxn.  She was d/c'd home on 8/2.  Unfortunately, she presented back to the ED on 8/7 with confusion, diarrhea, fevers, chills, and abdominal pain.  She was again found to be hypotensive with acute kidney injury.  CXR suggestive of pulmonary edema ad she was placed on bipap.  Hypotension persisted and she required norepi along with volume resuscitation.  In that setting, she was noted to be tachycardic with question of sinus tach vs. Afib/flutter.  She was placed on metoprolol (allergy go dilt) with some improvement in rate, though she remains tachycardic.  She is asymptomatic and denies chest pain, palpitations, or dyspnea associated with tachycardia.  Her primary complaint at this point is wkns associated with diffuse body aches and deconditioning.  CHA2DS2VASc = 3 (htn, diabetes, female).  Inpatient Medications  . antiseptic oral rinse  15 mL Mouth Rinse q12n4p  . budesonide  0.25 mg Nebulization BID  . chlorhexidine  15 mL Mouth Rinse BID  . enoxaparin (LOVENOX) injection  1 mg/kg Subcutaneous Q12H  . gabapentin  100 mg Oral TID  . insulin aspart  0-15 Units Subcutaneous Q4H  . metoprolol tartrate  25 mg Oral BID  . metronidazole  500 mg Intravenous Q8H  . rOPINIRole  1 mg Oral QHS  . saccharomyces boulardii  250 mg Oral BID  . sertraline  50 mg Oral Daily  . vancomycin  500 mg Oral QID    Followed by  . [START ON 10/27/2012] vancomycin  125 mg Oral BID   Followed by  . [START ON 11/04/2012] vancomycin  125 mg Oral Daily   Followed by  . [START ON 11/11/2012] vancomycin  125 mg Oral QODAY   Followed by  . [START ON 11/19/2012] vancomycin  125 mg Oral Q3 days   Family History  History reviewed.  Negative for premature CAD.   Social History History   Social History  . Marital Status: Married    Spouse Name: N/A    Number of Children: 1  . Years of Education: N/A   Occupational History  . Environmental education officer   Social History Main Topics  . Smoking status: Former Smoker -- 2.00 packs/day for 40 years    Types: Cigarettes    Quit date: 03/04/2012  . Smokeless tobacco: Never Used  . Alcohol Use: No  . Drug Use: No  . Sexually Active: Not on file   Other Topics Concern  . Not on file   Social History Narrative   Lives in [place] with her husband.  She does not routinely exercise.    Review of Systems  General:  She c/o general malaise with wkns/deconditioning.  No chills, fever, night sweats or weight changes.  Cardiovascular:  No chest pain, dyspnea on exertion, +++ edema, no orthopnea, palpitations, paroxysmal nocturnal dyspnea. Dermatological: No rash, lesions/masses Respiratory: No cough, dyspnea Urologic: No hematuria, dysuria Abdominal:   +++ nausea with intermittent abdominal discomfort. +++ diarrhea, no vomiting, bright red blood per rectum, melena, or hematemesis Neurologic:  No visual changes, wkns, changes in mental status. All other systems reviewed and are otherwise negative except as noted above.  Physical Exam  Blood pressure 106/49, pulse 110, temperature 97.6 F (36.4 C), temperature source Axillary, resp. rate 16, height 5\' 5"  (1.651 m), weight 257 lb (116.574 kg), SpO2 100.00%.  General: Pleasant, NAD Psych: flat affect. Neuro: Alert and oriented X 3. Moves all extremities spontaneously. HEENT: Normal  Neck: Supple, obese,  difficult to assess jvd.  No bruits. Lungs:  Resp regular and unlabored, diminished breath sounds bilat. Heart: IR, IR, tachy, no s3, s4, or murmurs. Abdomen: Obese, Soft, edematous, non-tender, non-distended, BS + x 4.  Extremities: No clubbing, cyanosis, 2+ bilat LE edema. DP/PT/Radials 2+ and equal bilaterally.  Labs  Lab Results  Component Value Date   TROPONINI <0.30 10/10/2012   Lab Results  Component Value Date   WBC 8.0 10/14/2012   HGB 9.2* 10/14/2012   HCT 29.9* 10/14/2012   MCV 88.5 10/14/2012   PLT 486* 10/14/2012    Recent Labs Lab 10/09/12 1540  10/14/12 0430  NA 133*  < > 140  K 3.3*  < > 3.9  CL 103  < > 107  CO2 21  < > 25  BUN 38*  < > 9  CREATININE 1.66*  < >  0.60  CALCIUM 6.2*  < > 8.2*  PROT 3.2*  --   --   BILITOT 0.2*  --   --   ALKPHOS 34*  --   --   ALT 9  --   --   AST 14  --   --   GLUCOSE 166*  < > 126*  < > = values in this interval not displayed.  Radiology/Studies  Dg Chest Port 1 View  10/09/2012   *RADIOLOGY REPORT*  Clinical Data: Diarrhea, pain, hypotension.  PORTABLE CHEST - 1 VIEW  Comparison: 09/14/2012.  The CT chest 03/24/2012.  Findings: Trachea is midline.  Heart size is grossly stable.  Mild asymmetry of the right hilum.  Lungs are low in volume with streaky densities at the lung bases.  No definite pleural fluid.  IMPRESSION:  1.  Low lung volumes with streaky densities at the lung bases, possibly representing atelectasis or scarring. 2.  Question mild asymmetry of the right hilum.  Attention on follow-up exams is warranted.   Original Report Authenticated By: Leanna Battles, M.D.   Dg Abd Portable 2v  10/09/2012   *RADIOLOGY REPORT*  Clinical Data: Diarrhea.  History of C difficile colitis.  PORTABLE ABDOMEN - 2 VIEW  Comparison: 10/01/2012.  Findings: Gas and stool are seen scattered throughout the colon extending to the level of the distal rectum.  No pathologic distension of small bowel is noted.  No gross evidence of  pneumoperitoneum.  Notably, the haustra in the transverse colon appears thickened, compatible with the reported history of C difficile colitis.  Surgical clips in the right upper quadrant of the abdomen compatible with prior cholecystectomy.  Clips are also noted in the pelvis from bilateral tubal ligation.  IMPRESSION: 1.  Nonobstructive bowel gas pattern. 2.  No pneumoperitoneum. 3.  Markedly thickened haustral markings compatible with the patient's reported history of C difficile colitis.   Original Report Authenticated By: Trudie Reed, M.D.   ECG  Afib, 119, rbbb, no acute st/t changes.  ASSESSMENT AND PLAN  1.  Afib and AFlutter:  In setting of recurrent C diff complicated by hypotension, shock, and acute renal failure.  Rates have generally been in the 1-teens to 120's.  She is asymptomatic.  Suspect both rhythm and rate are being driven by acute on chronic illness.  Rate control has been difficult partly due to soft BP's on metoprolol.  With her varied renal fxn and propensity toward renal failure, we would not use digoxin.  We will initiate oral amiodarone for the purposes of rate control, in hopes of discontinuing this prior to d/c, with maintenance of bb therapy otherwise.  Continue lovenox for now however with recurrent C diff and ? Compliance with antibiotics in the past, we would not plan to initiate on long term anticoagulation.  2.  Recurrent C diff colitis:  abx per IM/ID.  3.  AKI:  Improved.    4.  Hypotension:  BP's remain soft but overall improved.  5.  Hypoalbuminemia:  In setting of above.  Albumin 1.3.  With hydration, weight has been steadily climbing and she is fairly edematous.  Lasix has been on hold secondary to #3.  Add TED hose and will plan to resume lasix as creat remains stable.  Signed, Nicolasa Ducking, NP 10/14/2012, 3:19 PM  Patient seen and examined independently. Gilford Raid, NP note reviewed carefully - agree with his assessment and plan. I have  edited the note based on my findings.   I have  reviewed her tele and ECG strips carefully. She is now in AF but also appears to have periods of AFL. Her rate is poorly controlled with ventricular rates ranging from 100-150 despite lopressor. Her BP is too soft currently to titrate b-blockers further. She apparently is allergic to cardizem and digoxin is not an option given her labile renal function. Thus we will start oral amiodarone for rate control. Ideally would like to wean the amio off prior to d/c if possible as sh improves.  Agree with anti-coagulation for now but given h/o noncompliance I am not sure that she will be a good candidate for long-term anti-coagulation despite high CHADS2 score.  She is 3rd spacing fluids. Would consider TED hose or UNA boots.   Daniel Bensimhon,MD 4:26 PM

## 2012-10-14 NOTE — Progress Notes (Signed)
Nurse communicated to MD that post lopressor administrations, pt BP dropped systolic in 80's, pt is asymptomatic with heart rate still ranging 110's-140's periodically.  Nurse will continue to monitor.

## 2012-10-14 NOTE — Progress Notes (Signed)
[facility]    Subjective: Patient sitting up in bed SOB and mildly uncomfortable. She expresses desire for bedside commode and removal of her foley cath and rectal tube. Denies fevers, chills, CP.   Antibiotics:  Anti-infectives   Start     Dose/Rate Route Frequency Ordered Stop   11/19/12 1000  vancomycin (VANCOCIN) 50 mg/mL oral solution 125 mg     125 mg Oral Every 3 DAYS 10/13/12 1653 12/04/12 0959   11/11/12 1000  vancomycin (VANCOCIN) 50 mg/mL oral solution 125 mg     125 mg Oral Every other day 10/13/12 1653 11/19/12 0959   11/04/12 1000  vancomycin (VANCOCIN) 50 mg/mL oral solution 125 mg     125 mg Oral Daily 10/13/12 1653 11/11/12 0959   10/27/12 2200  vancomycin (VANCOCIN) 50 mg/mL oral solution 125 mg     125 mg Oral 2 times daily 10/13/12 1653 11/03/12 2159   10/13/12 1830  vancomycin (VANCOCIN) 50 mg/mL oral solution 500 mg     500 mg Oral 4 times daily 10/13/12 1653 10/27/12 1759   10/13/12 1400  metroNIDAZOLE (FLAGYL) IVPB 500 mg     500 mg 100 mL/hr over 60 Minutes Intravenous Every 8 hours 10/13/12 1110     10/10/12 1000  metroNIDAZOLE (FLAGYL) tablet 500 mg  Status:  Discontinued     500 mg Oral 3 times per day 10/10/12 0235 10/13/12 1110   10/10/12 0800  vancomycin (VANCOCIN) 50 mg/mL oral solution 500 mg  Status:  Discontinued     500 mg Oral 4 times per day 10/10/12 0239 10/13/12 1650   10/10/12 0000  vancomycin (VANCOCIN) 50 mg/mL oral solution 500 mg  Status:  Discontinued     500 mg Oral 4 times per day 10/09/12 2135 10/10/12 0239   10/09/12 2200  metroNIDAZOLE (FLAGYL) tablet 500 mg  Status:  Discontinued     500 mg Oral 3 times per day 10/09/12 2135 10/10/12 0235   10/09/12 1545  metroNIDAZOLE (FLAGYL) IVPB 500 mg     500 mg 100 mL/hr over 60 Minutes Intravenous  Once 10/09/12 1539 10/09/12 1721      Medications: Scheduled Meds: . antiseptic oral rinse  15 mL Mouth Rinse q12n4p  . budesonide  0.25 mg Nebulization BID    . chlorhexidine  15 mL Mouth Rinse BID  . gabapentin  100 mg Oral TID  . heparin subcutaneous  5,000 Units Subcutaneous Q8H  . insulin aspart  0-15 Units Subcutaneous Q4H  . metoprolol tartrate  25 mg Oral BID  . metronidazole  500 mg Intravenous Q8H  . rOPINIRole  1 mg Oral QHS  . saccharomyces boulardii  250 mg Oral BID  . sertraline  50 mg Oral Daily  . vancomycin  500 mg Oral QID   Followed by  . [START ON 10/27/2012] vancomycin  125 mg Oral BID   Followed by  . [START ON 11/04/2012] vancomycin  125 mg Oral Daily   Followed by  . [START ON 11/11/2012] vancomycin  125 mg Oral QODAY   Followed by  . [START ON 11/19/2012] vancomycin  125 mg Oral Q3 days   Continuous Infusions: . sodium chloride 100 mL/hr at 10/13/12 1154   PRN Meds:.albuterol, HYDROcodone-acetaminophen, LORazepam, metoprolol   Objective: Weight change:   Intake/Output Summary (Last 24 hours) at 10/14/12 0849 Last data filed at 10/14/12 0800  Gross per 24 hour  Intake   2564 ml  Output   1555 ml  Net   1009 ml   Blood pressure 118/96, pulse 119, temperature 97.6 F (36.4 C), temperature source Axillary, resp. rate 21, height 5\' 5"  (1.651 m), weight 116.574 kg (257 lb), SpO2 98.00%. Temp:  [97.2 F (36.2 C)-97.7 F (36.5 C)] 97.6 F (36.4 C) (08/12 0710) Pulse Rate:  [58-150] 119 (08/12 0800) Resp:  [13-23] 21 (08/12 0800) BP: (65-142)/(46-115) 118/96 mmHg (08/12 0800) SpO2:  [96 %-100 %] 98 % (08/12 0800)  Physical Exam: General: Alert and awake, oriented x3, not in any acute distress. HEENT: anicteric sclera, pupils reactive to light and accommodation, EOMI CVS regular rate, normal r,  no murmur rubs or gallops Chest: coarse breath sounds 2/2 body habitus Abdomen: obese, soft, nontender, nondistended, normal bowel sounds Extremities: 2+ pitting edema B/L LE Skin: no rashes Lymph: no new lymphadenopathy Neuro: nonfocal  Lab Results:  Recent Labs  10/13/12 0500 10/14/12 0430  WBC 9.2 8.0   HGB 9.6* 9.2*  HCT 30.2* 29.9*  PLT 506* 486*    BMET  Recent Labs  10/13/12 0500 10/14/12 0430  NA 141 140  K 4.0 3.9  CL 108 107  CO2 24 25  GLUCOSE 150* 126*  BUN 13 9  CREATININE 0.66 0.60  CALCIUM 8.2* 8.2*    Micro Results: Recent Results (from the past 240 hour(s))  CULTURE, BLOOD (ROUTINE X 2)     Status: None   Collection Time    10/09/12  4:00 PM      Result Value Range Status   Specimen Description BLOOD ARM RIGHT   Final   Special Requests BOTTLES DRAWN AEROBIC AND ANAEROBIC 10CC   Final   Culture  Setup Time     Final   Value: 10/09/2012 21:56     Performed at [facility]   Culture     Final   Value:        BLOOD CULTURE RECEIVED NO GROWTH TO DATE CULTURE WILL BE HELD FOR 5 DAYS BEFORE ISSUING A FINAL NEGATIVE REPORT     Performed at [facility]   Report Status PENDING   Incomplete  CULTURE, BLOOD (ROUTINE X 2)     Status: None   Collection Time    10/09/12  4:20 PM      Result Value Range Status   Specimen Description BLOOD HAND LEFT   Final   Special Requests BOTTLES DRAWN AEROBIC ONLY 6CC   Final   Culture  Setup Time     Final   Value: 10/09/2012 21:56     Performed at [facility]   Culture     Final   Value:        BLOOD CULTURE RECEIVED NO GROWTH TO DATE CULTURE WILL BE HELD FOR 5 DAYS BEFORE ISSUING A FINAL NEGATIVE REPORT     Performed at [facility]   Report Status PENDING   Incomplete  URINE CULTURE     Status: None   Collection Time    10/09/12 10:14 PM      Result Value Range Status   Specimen Description URINE, CATHETERIZED   Final   Special Requests NONE   Final   Culture  Setup Time     Final   Value: 10/09/2012 23:08     Performed at [facility] Count     Final   Value: NO GROWTH     Performed at [facility]   Culture     Final   Value:  NO GROWTH     Performed at [facility]   Report Status 10/10/2012 FINAL   Final  CLOSTRIDIUM DIFFICILE BY PCR      Status: None   Collection Time    10/10/12  1:46 PM      Result Value Range Status   C difficile by pcr NEGATIVE  NEGATIVE Final    Studies/Results: No results found.    Assessment/Plan: [name] is a 57 y.o. female with recurrent C. Diff colitis 2/2 poor med compliance who returns back to [facility] after recent discharge. She understands need for compliance at home upon discharge and does have ability to obtain vanc soln from [facility].  C. Diff colitis: --in agreement with GI for no flexible sigmoidoscopy and biopsy as this would not change the management of therapy and her colitis is closely related to her med noncompliance at home --continue Vanc PO pulse and taper with QID dosing x 2 weeks then taper --d/c flagyl --continue florastor    LOS: 5 days   Lewie Chamber 10/14/2012, 8:49 AM

## 2012-10-14 NOTE — Progress Notes (Signed)
ANTICOAGULATION CONSULT NOTE - Initial Consult  Pharmacy Consult for enoxaparin Indication: atrial fibrillation  Allergies  Allergen Reactions  . Ambien (Zolpidem Tartrate) Other (See Comments)    Severe hallucinations and behavior changes reported by family  . Ciprofloxacin     PATIENT AT VERY HIGH RISK FOR C DIFFICILE COLITIS  . Cardizem (Diltiazem Hcl) Swelling  . Lunesta (Eszopiclone) Other (See Comments)    Nightmares & hallucinations  . Metformin And Related Swelling  . Protonix (Pantoprazole Sodium) Other (See Comments)    Unknown reaction-"Doctor told me to never take it"    Patient Measurements: Height: 5\' 5"  (165.1 cm) Weight: 257 lb (116.574 kg) IBW/kg (Calculated) : 57   Vital Signs: Temp: 97.6 F (36.4 C) (08/12 1200) Temp src: Axillary (08/12 1200) BP: 94/81 mmHg (08/12 1300) Pulse Rate: 121 (08/12 1300)  Labs:  Recent Labs  10/12/12 0400 10/13/12 0500 10/14/12 0430  HGB 9.0* 9.6* 9.2*  HCT 27.9* 30.2* 29.9*  PLT 482* 506* 486*  CREATININE 0.69 0.66 0.60    Estimated Creatinine Clearance: 97.8 ml/min (by C-G formula based on Cr of 0.6).   Medical History: Past Medical History  Diagnosis Date  . COPD (chronic obstructive pulmonary disease)   . Respiratory failure   . Morbid obesity   . Tobacco abuse   . GERD (gastroesophageal reflux disease)   . Hyperlipidemia   . Chronic pain   . Hypertension   . Heart attack   . Arthritis   . Allergic rhinitis   . Diabetes mellitus, type 2   . Diabetes mellitus type 2 in obese 02/29/2012  . HTN (hypertension) 02/29/2012  . C. difficile diarrhea 09/2012  . Chronic kidney disease 09/2012    acute renal failure  . Shock 09/2012     Assessment: 57 y/o female with a history of recurrent C.diff and outpatient medication non-compliance, returned to the [facility] ED on 8/7 with confusion, diarrhea, subjective fevers, chills, and abdominal pain.  She was found to be hypotensive with AKI. BP initially with IVF.  She  has new  A-fib on this admission which I do not see listed on her problem list at last hospital d/c 7/14.  Pharmacy was asked to dose treatment dose enoxaparin (1mg /kg) q12 for her Cr Cl 43ml/min for stroke prevention in the setting of Afib -RVR.  She has a diltiazem allergy so metoprolol was used for rate control.  Baseline h/h, pltc is stable.  Goal of Therapy:  Anti-Xa level 0.6-1.2 units/ml 4hrs after LMWH dose given Monitor platelets by anticoagulation protocol: Yes   Plan:  Enoxaparin 1mg /kg  =115mg  q12hr Monitor CBC and renal function, s/s bleeding   Leota Sauers Pharm.D. CPP, BCPS Clinical Pharmacist [phone] 10/14/2012 1:15 PM

## 2012-10-15 ENCOUNTER — Other Ambulatory Visit: Payer: Self-pay

## 2012-10-15 ENCOUNTER — Encounter (HOSPITAL_COMMUNITY): Payer: Self-pay | Admitting: *Deleted

## 2012-10-15 DIAGNOSIS — I4892 Unspecified atrial flutter: Secondary | ICD-10-CM | POA: Diagnosis present

## 2012-10-15 DIAGNOSIS — I469 Cardiac arrest, cause unspecified: Secondary | ICD-10-CM | POA: Diagnosis not present

## 2012-10-15 DIAGNOSIS — I495 Sick sinus syndrome: Secondary | ICD-10-CM

## 2012-10-15 LAB — BASIC METABOLIC PANEL
CO2: 23 mEq/L (ref 19–32)
Chloride: 104 mEq/L (ref 96–112)
GFR calc Af Amer: >90 mL/min (ref >90)
Potassium: 4.2 mEq/L (ref 3.5–5.1)

## 2012-10-15 LAB — TSH: TSH: 2.447 u[IU]/mL (ref 0.350–4.500)

## 2012-10-15 LAB — GLUCOSE, CAPILLARY
Glucose-Capillary: 128 mg/dL — ABNORMAL HIGH (ref 70–99)
Glucose-Capillary: 121 mg/dL — ABNORMAL HIGH (ref 70–99)
Glucose-Capillary: 149 mg/dL — ABNORMAL HIGH (ref 70–99)

## 2012-10-15 LAB — CULTURE, BLOOD (ROUTINE X 2): Culture: NO GROWTH

## 2012-10-15 LAB — HIV-1 RNA QUANT-NO REFLEX-BLD: HIV-1 RNA Quant, Log: <1.3 {Log} (ref <1.30)

## 2012-10-15 MED ORDER — SODIUM CHLORIDE 0.45 % IV SOLN: INTRAVENOUS | Status: DC

## 2012-10-15 MED ORDER — OXYCODONE HCL 5 MG PO TABS
5.0000 mg | ORAL_TABLET | ORAL | Status: DC | PRN
  Administered 2012-10-15 – 2012-10-17 (×6): 5 mg via ORAL
  Filled 2012-10-15 (×7): qty 1

## 2012-10-15 MED ORDER — WARFARIN SODIUM 5 MG PO TABS
5.0000 mg | ORAL_TABLET | ORAL | Status: AC
  Administered 2012-10-16: 5 mg via ORAL
  Filled 2012-10-15: qty 1

## 2012-10-15 MED ORDER — LEVALBUTEROL HCL 0.63 MG/3ML IN NEBU
0.6300 mg | INHALATION_SOLUTION | Freq: Four times a day (QID) | RESPIRATORY_TRACT | Status: DC | PRN
  Administered 2012-10-15 – 2012-10-18 (×2): 0.63 mg via RESPIRATORY_TRACT
  Filled 2012-10-15 (×2): qty 3

## 2012-10-15 MED ORDER — CHLORHEXIDINE GLUCONATE 4 % EX LIQD
60.0000 mL | Freq: Once | CUTANEOUS | Status: AC
  Administered 2012-10-16: 4 via TOPICAL
  Filled 2012-10-15 (×2): qty 60

## 2012-10-15 MED ORDER — CEFAZOLIN SODIUM-DEXTROSE 2-3 GM-% IV SOLR
2.0000 g | INTRAVENOUS | Status: AC
  Administered 2012-10-16: 2 g via INTRAVENOUS
  Filled 2012-10-15: qty 50

## 2012-10-15 MED ORDER — SODIUM CHLORIDE 0.9 % IV SOLN
INTRAVENOUS | Status: DC
  Administered 2012-10-16: 75 mL/h via INTRAVENOUS
  Administered 2012-10-18 (×2): via INTRAVENOUS
  Administered 2012-10-19: 75 mL/h via INTRAVENOUS

## 2012-10-15 MED ORDER — SODIUM CHLORIDE 0.9 % IR SOLN
80.0000 mg | Status: DC
  Filled 2012-10-15: qty 2

## 2012-10-15 MED ORDER — SODIUM CHLORIDE 0.9 % IV SOLN: INTRAVENOUS | Status: DC

## 2012-10-15 MED ORDER — WARFARIN - PHARMACIST DOSING INPATIENT
Freq: Every day | Status: DC
  Administered 2012-10-16 – 2012-10-29 (×2)

## 2012-10-15 MED ORDER — FIDAXOMICIN 200 MG PO TABS
200.0000 mg | ORAL_TABLET | Freq: Two times a day (BID) | ORAL | Status: AC
  Administered 2012-10-15 – 2012-10-26 (×22): 200 mg via ORAL
  Filled 2012-10-15 (×26): qty 1

## 2012-10-15 MED ORDER — CHLORHEXIDINE GLUCONATE 4 % EX LIQD
60.0000 mL | Freq: Once | CUTANEOUS | Status: AC
  Administered 2012-10-15: 4 via TOPICAL
  Filled 2012-10-15 (×2): qty 60

## 2012-10-15 NOTE — Progress Notes (Signed)
[facility]    Subjective: Patient states she started having what she calls "panic attacks" up to 2 weeks ago and never had episodes prior. She says she felt like that again yesterday when she was noted to be in afib and aflutter yesterday and cardiology consult was obtained. She was ultimately started on amiodarone for rate control given that her BB could not be titrated further in light of her BPs. During exam at 0902 patient went into asystole for approximately 50 seconds and spontaneously came back before initiation of CPR. Upon reassessment she was oriented to person (full name), place (location, city), and month and very concerned about her situation.    Antibiotics:  Anti-infectives   Start     Dose/Rate Route Frequency Ordered Stop   11/19/12 1000  vancomycin (VANCOCIN) 50 mg/mL oral solution 125 mg     125 mg Oral Every 3 DAYS 10/13/12 1653 12/04/12 0959   11/11/12 1000  vancomycin (VANCOCIN) 50 mg/mL oral solution 125 mg     125 mg Oral Every other day 10/13/12 1653 11/19/12 0959   11/04/12 1000  vancomycin (VANCOCIN) 50 mg/mL oral solution 125 mg     125 mg Oral Daily 10/13/12 1653 11/11/12 0959   10/27/12 2200  vancomycin (VANCOCIN) 50 mg/mL oral solution 125 mg     125 mg Oral 2 times daily 10/13/12 1653 11/03/12 2159   10/13/12 1830  vancomycin (VANCOCIN) 50 mg/mL oral solution 500 mg     500 mg Oral 4 times daily 10/13/12 1653 10/27/12 1759   10/13/12 1400  metroNIDAZOLE (FLAGYL) IVPB 500 mg  Status:  Discontinued     500 mg 100 mL/hr over 60 Minutes Intravenous Every 8 hours 10/13/12 1110 10/14/12 1724   10/10/12 1000  metroNIDAZOLE (FLAGYL) tablet 500 mg  Status:  Discontinued     500 mg Oral 3 times per day 10/10/12 0235 10/13/12 1110   10/10/12 0800  vancomycin (VANCOCIN) 50 mg/mL oral solution 500 mg  Status:  Discontinued     500 mg Oral 4 times per day 10/10/12 0239 10/13/12 1650   10/10/12 0000  vancomycin (VANCOCIN) 50 mg/mL oral  solution 500 mg  Status:  Discontinued     500 mg Oral 4 times per day 10/09/12 2135 10/10/12 0239   10/09/12 2200  metroNIDAZOLE (FLAGYL) tablet 500 mg  Status:  Discontinued     500 mg Oral 3 times per day 10/09/12 2135 10/10/12 0235   10/09/12 1545  metroNIDAZOLE (FLAGYL) IVPB 500 mg     500 mg 100 mL/hr over 60 Minutes Intravenous  Once 10/09/12 1539 10/09/12 1721      Medications: Scheduled Meds: . amiodarone  400 mg Oral BID  . antiseptic oral rinse  15 mL Mouth Rinse q12n4p  . budesonide  0.25 mg Nebulization BID  . chlorhexidine  15 mL Mouth Rinse BID  . enoxaparin (LOVENOX) injection  1 mg/kg Subcutaneous Q12H  . gabapentin  100 mg Oral TID  . insulin aspart  0-15 Units Subcutaneous TID WC  . metoprolol tartrate  25 mg Oral BID  . rOPINIRole  1 mg Oral QHS  . saccharomyces boulardii  250 mg Oral BID  . sertraline  50 mg Oral Daily  . vancomycin  500 mg Oral QID   Followed by  . [START ON 10/27/2012] vancomycin  125 mg Oral BID   Followed by  . [START ON 11/04/2012] vancomycin  125 mg Oral Daily   Followed by  . [  START ON 11/11/2012] vancomycin  125 mg Oral QODAY   Followed by  . [START ON 11/19/2012] vancomycin  125 mg Oral Q3 days   Continuous Infusions: . sodium chloride 100 mL/hr at 10/13/12 1154   PRN Meds:.albuterol, HYDROcodone-acetaminophen   Objective: Weight change:   Intake/Output Summary (Last 24 hours) at 10/15/12 0915 Last data filed at 10/15/12 0600  Gross per 24 hour  Intake   2340 ml  Output   1875 ml  Net    465 ml   Blood pressure 117/65, pulse 110, temperature 98 F (36.7 C), temperature source Oral, resp. rate 14, height 5\' 5"  (1.651 m), weight 116 kg (255 lb 11.7 oz), SpO2 100.00%. Temp:  [97.3 F (36.3 C)-98 F (36.7 C)] 98 F (36.7 C) (08/13 0810) Pulse Rate:  [68-141] 110 (08/13 0810) Resp:  [13-21] 14 (08/13 0810) BP: (82-118)/(49-91) 117/65 mmHg (08/13 0810) SpO2:  [94 %-100 %] 100 % (08/13 0810) Weight:  [116 kg (255 lb 11.7  oz)] 116 kg (255 lb 11.7 oz) (08/13 0419)  Physical Exam: General: Anxious, Alert and awake, oriented x3, not in any acute distress. HEENT: anicteric sclera, pupils reactive to light and accommodation, EOMI CVS regular rate, normal r,  no murmur rubs or gallops Chest: distant breath sounds due to body habitus Abdomen: obese, soft, nontender, nondistended, normal bowel sounds Extremities: no  clubbing or edema noted bilaterally Skin: no rashes Lymph: no new lymphadenopathy Neuro: nonfocal  Lab Results:  Recent Labs  10/13/12 0500 10/14/12 0430  WBC 9.2 8.0  HGB 9.6* 9.2*  HCT 30.2* 29.9*  PLT 506* 486*    BMET  Recent Labs  10/14/12 0430 10/15/12 0500  NA 140 139  K 3.9 4.2  CL 107 104  CO2 25 23  GLUCOSE 126* 136*  BUN 9 6  CREATININE 0.60 0.57  CALCIUM 8.2* 8.7    Micro Results: Recent Results (from the past 240 hour(s))  CULTURE, BLOOD (ROUTINE X 2)     Status: None   Collection Time    10/09/12  4:00 PM      Result Value Range Status   Specimen Description BLOOD ARM RIGHT   Final   Special Requests BOTTLES DRAWN AEROBIC AND ANAEROBIC 10CC   Final   Culture  Setup Time     Final   Value: 10/09/2012 21:56     Performed at [facility]   Culture     Final   Value: NO GROWTH 5 DAYS     Performed at [facility]   Report Status 10/15/2012 FINAL   Final  CULTURE, BLOOD (ROUTINE X 2)     Status: None   Collection Time    10/09/12  4:20 PM      Result Value Range Status   Specimen Description BLOOD HAND LEFT   Final   Special Requests BOTTLES DRAWN AEROBIC ONLY [facility]   Final   Culture  Setup Time     Final   Value: 10/09/2012 21:56     Performed at [facility]   Culture     Final   Value: NO GROWTH 5 DAYS     Performed at [facility]   Report Status 10/15/2012 FINAL   Final  URINE CULTURE     Status: None   Collection Time    10/09/12 10:14 PM      Result Value Range Status   Specimen Description URINE, CATHETERIZED    Final   Special Requests NONE  Final   Culture  Setup Time     Final   Value: 10/09/2012 23:08     Performed at [facility]   Colony Count     Final   Value: NO GROWTH     Performed at [facility]   Culture     Final   Value: NO GROWTH     Performed at [facility]   Report Status 10/10/2012 FINAL   Final  CLOSTRIDIUM DIFFICILE BY PCR     Status: None   Collection Time    10/10/12  1:46 PM      Result Value Range Status   C difficile by pcr NEGATIVE  NEGATIVE Final    Studies/Results: No results found.    Assessment/Plan: [name] is a 57 y.o. female with episode of asystole this am and recurrent C. Diff after recent discharge and questionable historian who is responding well to vanc therapy. Will defer to cardiology for her episode this morning and follow her very closely as needed.  C. Diff colitis:  --in agreement with GI for no flexible sigmoidoscopy and biopsy as this would not change the management of therapy and her colitis is closely related to her med noncompliance at home  --continue Vanc PO pulse and taper with QID dosing x 2 weeks (until 10/24/12) then taper  --continue florastor    LOS: 6 days   Lewie Chamber 10/15/2012, 9:15 AM

## 2012-10-15 NOTE — Consult Note (Signed)
ELECTROPHYSIOLOGY CONSULT NOTE  Patient ID: [name] MRN: [id number], DOB/AGE: [birth year]   Admit date: 10/09/2012 Date of Consult: 10/15/2012  Primary Physician: [name], NP Primary Cardiologist: New to [place] Reason for Consultation: Atrial fibrillation / atrial flutter   History of Present Illness [name] is a 57 y.o. 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 She was found to be tachycardic 10/14/2012 and Cardiology was asked to evaluate. Metoprolol was added for rate control of atrial fibrillation / atrial flutter. Her rates have generally been in the 120s. She was asymptomatic. Despite the addition of BB, her rates were persistently elevated. Given her hypotension and renal failure, rate controlling options were limited; therefore the plan was made to use IV amiodarone short term for rate control. This morning she experienced bradycardic arrest with CHB, duration 50 seconds. She was talking with a medical student when she "just went out." Prior to initiation of CPR, she had ROSC and regained consciousness. She became hemodynamically stable and did not require BP or respiratory support. EP has been asked to see her for cardiac rhythm recommendations.  Currently she denies CP, SOB or palpitations. Amiodarone and BB have been discontinued. She denies any history of syncope. Her rate is in the upper 120s and BP is soft, systolic in 80s.  Of note, she has had a difficult year medically. 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 She was discharged in early Spring and following discharge had persistent foul smelling diarrhea. She was admitted 09/09/2012 with C diff complicated by hypotension, sepsis, and renal failure.  Past  Medical History Past Medical History  Diagnosis Date  . COPD (chronic obstructive pulmonary disease)   . Chronic respiratory failure   . Morbid obesity   . Tobacco abuse   . GERD (gastroesophageal reflux disease)   . Hyperlipidemia   . Chronic pain   . Hypertension   . Coronary vasospasm     a. 03/2004 NSTEMI/Cath: EF 45%, LM nl, LAD nl, D1 nl, D2 nl, LCX nl, OM1 large-nl, RCA-spasm noted, otw nl.  . Arthritis   . Allergic rhinitis   . Diabetes mellitus type 2 in obese 02/29/2012  . HTN (hypertension) 02/29/2012  . 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 recurrent in setting of abx noncompliance.  . Chronic kidney disease 09/2012    acute renal failure  . Shock 09/2012    Past Surgical History Past Surgical History  Procedure Laterality Date  . Cholecystectomy    . Partial hysterectomy    . Angioplasty    . Umbilical hernia repair  12/2009    Allergies/Intolerances Allergies  Allergen Reactions  . Ambien [Zolpidem Tartrate] Other (See Comments)    Severe hallucinations and behavior changes reported by family  . Ciprofloxacin     PATIENT AT VERY HIGH RISK FOR C DIFFICILE COLITIS  . Cardizem [Diltiazem Hcl] Swelling  . Lunesta [Eszopiclone] Other (See Comments)    Nightmares & hallucinations  . Metformin And Related Swelling  . Protonix [Pantoprazole Sodium] Other (See Comments)    Unknown reaction-"Doctor told me to never take it"   Inpatient Medications . antiseptic oral rinse  15 mL Mouth Rinse q12n4p  . budesonide  0.25 mg Nebulization BID  . chlorhexidine  15 mL Mouth Rinse BID  . enoxaparin (LOVENOX) injection  1 mg/kg Subcutaneous Q12H  . gabapentin  100 mg Oral TID  . insulin aspart  0-15 Units Subcutaneous TID WC  . rOPINIRole  1 mg Oral QHS  . saccharomyces boulardii  250 mg Oral BID  . sertraline  50 mg Oral Daily  . vancomycin  500 mg Oral QID   Followed by  . [START ON 10/27/2012] vancomycin  125 mg Oral BID   Followed by  . [START ON 11/04/2012] vancomycin   125 mg Oral Daily   Followed by  . [START ON 11/11/2012] vancomycin  125 mg Oral QODAY   Followed by  . [START ON 11/19/2012] vancomycin  125 mg Oral Q3 days   . sodium chloride 100 mL/hr at 10/13/12 1154   Family History Positive for CAD   Social History Social History  . Marital Status: Married   Occupational History  . Environmental education officer   Social History Main Topics  . Smoking status: Former Smoker -- 2.00 packs/day for 40 years    Types: Cigarettes    Quit date: 03/04/2012  . Smokeless tobacco: Never Used  . Alcohol Use: No  . Drug Use: No   Review of Systems General: No chills, fever, night sweats or weight changes  Cardiovascular:  No chest pain, dyspnea on exertion, edema, orthopnea, palpitations, paroxysmal nocturnal dyspnea Dermatological: No rash, lesions or masses Respiratory: No cough, dyspnea Urologic: No hematuria, dysuria Abdominal: No nausea, vomiting, diarrhea, bright red blood per rectum, melena, or hematemesis Neurologic: No visual changes, weakness, changes in mental status All other systems reviewed and are otherwise negative except as noted above.  Physical Exam Vitals: Blood pressure 130/80, pulse 120, temperature 98.1 F (36.7 C), temperature source Oral, resp. rate 16, height 5\' 5"  (1.651 m), weight 255 lb 11.7 oz (116 kg), SpO2 99.00%.  General: Well developed, chronically ill appearing  57 y.o. female in no acute distress. HEENT: Normocephalic, atraumatic. EOMs intact. Sclera nonicteric. Oropharynx clear.  Neck: Supple. Difficult to assess JVD. Lungs: Respirations regular and unlabored. Decreased breath sounds throughout. No wheezes, rales or rhonchi. Heart: Irregular. S1, S2 present. No murmurs, rub, S3 or S4. Abdomen: Obese. Mildly distended. BS present x 4 quadrants.  Extremities: No clubbing or cyanosis. 2+ LE edema. DP/PT/Radials 2+ and equal bilaterally. Psych: Normal affect. Neuro: Alert and oriented X 3. Moves all extremities  spontaneously. Musculoskeletal: No kyphosis. Skin: Intact. Warm and dry. No rashes or petechiae in exposed areas.   Labs Lab Results  Component Value Date   WBC 8.0 10/14/2012   HGB 9.2* 10/14/2012   HCT 29.9* 10/14/2012   MCV 88.5 10/14/2012   PLT 486* 10/14/2012    Recent Labs Lab 10/09/12 1540  10/15/12 0500  NA 133*  < > 139  K 3.3*  < > 4.2  CL 103  < > 104  CO2 21  < > 23  BUN 38*  < > 6  CREATININE 1.66*  < > 0.57  CALCIUM 6.2*  < > 8.7  PROT 3.2*  --   --   BILITOT 0.2*  --   --   ALKPHOS 34*  --   --   ALT 9  --   --   AST 14  --   --   GLUCOSE 166*  < > 136*  < > = values in this interval not displayed. No components found with this basename: MAGNESIUM,   Recent Labs  10/14/12 1245  TSH 2.447    Radiology/Studies Dg Chest  Port 1 View  10/09/2012   *RADIOLOGY REPORT*  Clinical Data: Shortness of breath  PORTABLE CHEST - 1 VIEW  Comparison: 10/09/2012  Findings: Left IJ central line tip at the mid SVC level.  Stable heart size and vascularity.  Exam is rotated to the left.  No new airspace disease, collapse, or consolidation.  No effusion or pneumothorax.  Chronic small right diaphragmatic hernia as before.  IMPRESSION: Stable exam.  No superimposed acute process   Original Report Authenticated By: Judie Petit. Miles Costain, M.D.   Dg Chest Port 1 View  10/09/2012   *RADIOLOGY REPORT*  Clinical Data: Diarrhea, pain, hypotension.  PORTABLE CHEST - 1 VIEW  Comparison: 09/14/2012.  The CT chest 03/24/2012.  Findings: Trachea is midline.  Heart size is grossly stable.  Mild asymmetry of the right hilum.  Lungs are low in volume with streaky densities at the lung bases.  No definite pleural fluid.  IMPRESSION:  1.  Low lung volumes with streaky densities at the lung bases, possibly representing atelectasis or scarring. 2.  Question mild asymmetry of the right hilum.  Attention on follow-up exams is warranted.   Original Report Authenticated By: Leanna Battles, M.D.   Echocardiogram Mar 06, 2012 Study Conclusions - Left ventricle: Echo dencse apical mass that may represent a calcified false tendon or a calcified thrombus. Clinical correlatio recommended. The cavity size was normal. Systolic function was normal. The estimated ejection fraction was in the range of 50% to 55%. - Right ventricle: The cavity size was moderately dilated. Wall thickness was normal. Systolic function was reduced. - Pulmonary arteries: Systolic pressure was mildly increased. PA peak pressure: 32mm Hg (S). Follow-up limited echo Mar 07, 2012 Impression: - Limited study to R/O apical thrombus; definity used; no obvious apical thrombus.   12-lead ECG on 10/11/2012 shows rapid atrial flutter with RBBB Telemetry shows AFib/flutter with RVR, rates in 120s most often; this AM with bradycardic arrest - atrial flutter with complete heart block  Assessment and Plan 1. 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 Atrial fibrillation / flutter  - newly diagnosed - update echo - difficult to rate control in setting of ARF and hypotension, now with CHB on amio and BB 3. Hypotension  4. Recurrent C.diff colitis 5. ARF 6. Anemia  Dr. Graciela Husbands to see. Please see recommendations below. Signed, Rick Duff, PA-C 10/15/2012, 1:13 PM  The patient has intermittent complete heart block in the setting of long-standing bifascicular block. Her course was further complicated by new onset atrial fibrillation/flutter with a rapid ventricular response prompting beta blocker therapy which was present for 3 days before the complete heart block episode. She also had one dose of amiodarone.  This occurs in the context of recurrent C. difficile colitis which has been hemodynamically significant as manifested by recurrent episodes of prerenal azotemia and hypotension and in one account significant  leukocytosis.  Previous echocardiogram January 14 demonstrated normal left ventricular function and  normal left atrial dimension and as she is unaware of her atrial fibrillation/flutter by palpitations we don't have an idea as to how much of this is been a problem in the past.  Unfortunately, I think our options are quite limited. With intermittent complete heart block in the setting of chronic bifascicular block that occurs in the absence of any identifiable trigger, i.e. she was sitting there talking to the nurse and the medical student,  I think pacing as her only option. This will allow then aggressive rate control and then subacute cardioversion if needed for restoration of sinus rhythm  Given her Prolonged Exposure to antibiotics, will undertake this using an AEGIS pouch.   We will begin her on warfarin tonight. We will commence for rate control tomorrow following pacing. In the event that we cannot control her rate, we'll anticipate TE guided cardioversion next week following therapeutic INR

## 2012-10-15 NOTE — Progress Notes (Signed)
ANTICOAGULATION CONSULT NOTE - Initial Consult  Pharmacy Consult for coumadin  Indication: atrial fibrillation  Allergies  Allergen Reactions  . Ambien [Zolpidem Tartrate] Other (See Comments)    Severe hallucinations and behavior changes reported by family  . Ciprofloxacin     PATIENT AT VERY HIGH RISK FOR C DIFFICILE COLITIS  . Cardizem [Diltiazem Hcl] Swelling  . Lunesta [Eszopiclone] Other (See Comments)    Nightmares & hallucinations  . Metformin And Related Swelling  . Protonix [Pantoprazole Sodium] Other (See Comments)    Unknown reaction-"Doctor told me to never take it"    Patient Measurements: Height: 5\' 5"  (165.1 cm) Weight: 255 lb 11.7 oz (116 kg) IBW/kg (Calculated) : 57 Heparin Dosing Weight:   Vital Signs: Temp: 98.7 F (37.1 C) (08/13 2340) Temp src: Oral (08/13 2340) BP: 111/65 mmHg (08/13 2330) Pulse Rate: 107 (08/13 2330)  Labs:  Recent Labs  10/13/12 0500 10/14/12 0430 10/15/12 0500 10/15/12 2313  HGB 9.6* 9.2*  --   --   HCT 30.2* 29.9*  --   --   PLT 506* 486*  --   --   LABPROT  --   --   --  13.5  INR  --   --   --  1.05  CREATININE 0.66 0.60 0.57  --     Estimated Creatinine Clearance: 97.5 ml/min (by C-G formula based on Cr of 0.57).   Medical History: Past Medical History  Diagnosis Date  . COPD (chronic obstructive pulmonary disease)   . Chronic respiratory failure   . Morbid obesity   . Tobacco abuse   . GERD (gastroesophageal reflux disease)   . Hyperlipidemia   . Chronic pain   . Hypertension   . Coronary vasospasm     a. 03/2004 NSTEMI/Cath: EF 45%, LM nl, LAD nl, D1 nl, D2 nl, LCX nl, OM1 large-nl, RCA-spasm noted, otw nl.  . Arthritis   . Allergic rhinitis   . Diabetes mellitus type 2 in obese 02/29/2012  . HTN (hypertension) 02/29/2012  . 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 recurrent in setting of abx noncompliance.  . Chronic kidney disease 09/2012    acute renal failure  . Shock 09/2012    Medications:   Prescriptions prior to admission  Medication Sig Dispense Refill  . aspirin 81 MG chewable tablet Chew 1 tablet (81 mg total) by mouth daily.      . budesonide (PULMICORT) 0.25 MG/2ML nebulizer solution Take 2 mLs (0.25 mg total) by nebulization 2 (two) times daily.  60 mL  1  . diclofenac sodium (VOLTAREN) 1 % GEL Apply 4 g topically 2 (two) times daily as needed (for foot pain).       . famotidine (PEPCID) 20 MG tablet Take 1 tablet (20 mg total) by mouth at bedtime.  30 tablet  1  . furosemide (LASIX) 40 MG tablet Take 1 tablet (40 mg total) by mouth 2 (two) times daily.  60 tablet  1  . gabapentin (NEURONTIN) 100 MG capsule Take 1 capsule (100 mg total) by mouth 3 (three) times daily.  90 capsule  0  . glimepiride (AMARYL) 2 MG tablet Take 2 mg by mouth 2 (two) times daily with a meal.      . HYDROcodone-acetaminophen (NORCO) 7.5-325 MG per tablet Take 1 tablet by mouth every 4 (four) hours as needed for pain.      Marland Kitchen insulin aspart (NOVOLOG) 100 UNIT/ML injection Inject 0-8 Units into the skin 3 (three) times  daily with meals. On sliding scale:  0-100 0 units 101-150 take 3 units 151-200 take 5 units 201>300 take 8 units 400 or higher call MD      . insulin glargine (LANTUS) 100 UNIT/ML injection Inject 32 Units into the skin at bedtime.      Marland Kitchen ipratropium-albuterol (DUONEB) 0.5-2.5 (3) MG/3ML SOLN Take 3 mLs by nebulization 3 (three) times daily.      . isosorbide mononitrate (IMDUR) 15 mg TB24 Take 0.5 tablets (15 mg total) by mouth daily.  30 tablet  1  . metroNIDAZOLE (FLAGYL) 500 MG tablet Take 1 tablet (500 mg total) by mouth every 8 (eight) hours.  45 tablet  1  . rOPINIRole (REQUIP) 1 MG tablet Take 1 mg by mouth at bedtime.      . sertraline (ZOLOFT) 50 MG tablet Take 50 mg by mouth daily.      . vancomycin (VANCOCIN) 50 mg/mL oral solution 500 mg PO q6hrs for 1 week, then 250mg  PO q6hrs for 1 week, then 125mg  PO q6hrs for 1 week, then 125mg  PO q12hrs 1 week and stop. Dispense  full supply.  150 mL  1  . acetaminophen (TYLENOL) 325 MG tablet Take 650 mg by mouth every 6 (six) hours as needed for pain.        Assessment: New onset afib/flutter in 57 yo female with hx coronary vasospasm, recurrent c diff colitis and hypotension with ARF 8/7 and adx for diarrhea. "went out today" during interview with medical student and was resuscitated . Intermittent complete heart block with chronic bifascicular block requiring pacing at this point with TE guided cardioversion planned for next week following a therapeutic inr. Diarrhea and abx may complicated coumadin therapy.   Goal of Therapy:  INR 2-3 Monitor platelets by anticoagulation protocol: Yes   Plan:  Coumadin 5 mg to begin tonight with daily PT/inr monitoring for subsequent dosing.   Tharon Aquas 10/15/2012,11:41 PM

## 2012-10-15 NOTE — Progress Notes (Signed)
Call placed to [facility] PA re c/o dull chest pain relieved with repositioning, raising HOB up, also c/o "some difficulty breathing", ventricular response with Atrial fib currently sustaining at upper 140s-160, BP acceptable,O2 sat 97% on 4 LPM [place], will recall EP PA re rhythm, Berle Mull RN  Call placed to [facility] PA for EP, still awaiting Dr. Graciela Husbands to see, no new orders, Berle Mull RN

## 2012-10-15 NOTE — Progress Notes (Signed)
Daughter Personal assistant, contacted via telephone re Impending transfer to CICU, Berle Mull RN

## 2012-10-15 NOTE — Progress Notes (Signed)
[facility] Progress Note [place] TEAM 1 - Stepdown/ICU TEAM   [name] XBJ:[id number] DOB: 1955/05/11 DOA: 10/09/2012 PCP: [name], NP  Brief narrative: 57 y/o female who presented on 8/7 with confusion, diarrhea, and abd pain. She was hospitalized in December with ARDS from influenza and probable secondary pneumococcal pneumonia. For the tail end of her admission (in Jan) she developed C. difficile colitis. It appears she was treated with oral metronidazole. She was transferred to [facility] for ventilator weaning and rehab. She received a full course of treatment but continued to have diarrhea after she was discharged home (Feb 2014).  She returned to the hospital on 7/8 for diarrhea and hypotension - transferred to ICU on 7/9 for persisent hypotension and resp failure - intubated and placed on pressors. Found to have C diff colitis again.  She was ultimately discharged on 7/15 on PO Vanc.  She did not fill her medications after this discharge due to cost, and she was re-admitted on 7/28, treated again for C Diff and discharged on 8/2. This time with social services arranging outpt Vanc.  She was again admitted on 8/7 for diarrhea and hypotension. However, this time,  she states she was taking her medications appropriately (Vanc taper and Flagyl). She complained that she had to wait a long time at the pharmacy to Pick up the Vanc but did obtain her meds and take them appropriately.    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 - management per ID - still having watery stool - has flexiseal - cont IV  hydration  COPD  - on home O2 - stable/well compensated at this time   Morbid Obesity  Diabetes mellitus type 2  - CBG currently stable - cont to follow w/o change today   Atrial fibrillation and A- flutter - newly diagnosed  - reportedly allergic to Cardizem per EPIC but pt states she has no allergy - Cardiology is following  - ECHO 1/3 - EF 50-55%,  elevated pulm pressure and RV dilation - atria normal in size - TSH within normal range - full dose Lovenox  Hx of Coronary vasospasm  Jan 2006 NSTEMI/Cath: EF 45%, LM nl, LAD nl, D1 nl, D2 nl, LCX nl, OM1 large-nl, RCA-spasm noted, otw nl   Code Status: FULL  Family Communication: No family present at time of exam today Disposition Plan: follow in SDU- cont flexiseal due to watery stool today - ask PT/OT to start slow careful work w/ pt as she says she is so weak she literally can't even roll herself over in bed  Consultants: ID Cardiology  EP  Antibiotics: 8/7 flagyl >>> 8/12 8/7 oral vanc >>>  DVT prophylaxis: Full dose lovenox  HPI/Subjective: Patient is laying in bed and reporting that "I feel awful."  She denies chest pain fevers chills nausea or vomiting.  She reports intermittent crampy abdominal pain primarily at night.  She states that she feels so weak she can literally not roll herself over in bed.  Of note the patient suffered an episode of ventricular standstill this morning.  This was captured by telemetry and appeared to last approximately 50 seconds.  A medical  student and the patient's nurse where the bedside at the time that it occurred.  The attending physician for this patient was not informed of this event until well after the fact when making routine rounds.   Objective: Blood pressure 132/58, pulse 124, temperature 98 F (36.7 C), temperature source Oral, resp. rate 15, height 5\' 5"  (1.651 m), weight 116 kg (255 lb 11.7 oz), SpO2 98.00%.  Intake/Output Summary (Last 24 hours) at 10/15/12 1144 Last  data filed at 10/15/12 0600  Gross per 24 hour  Intake   2240 ml  Output   1875 ml  Net    365 ml   Exam: General: AAO x 3, no acute respiratory distress Lungs: Clear to auscultation bilaterally without wheezes or crackles though bs are distant due to body habitus Cardiovascular: Iregular rate and rhythm without murmur gallop or rub normal S1 and S2 Abdomen: morbildy obese, nontender, mildly distended, soft, bowel sounds positive, no rebound, no ascites, no appreciable mass Extremities: No significant cyanosis, clubbing - 1+ edema bilateral lower extremities  Data Reviewed: Basic Metabolic Panel:  Recent Labs Lab 10/10/12 0453 10/11/12 0440 10/12/12 0400 10/13/12 0500 10/14/12 0430 10/15/12 0500  NA 130* 136 136 141 140 139  K 4.4 4.2 3.8 4.0 3.9 4.2  CL 97 103 104 108 107 104  CO2 21 22 26 24 25 23   GLUCOSE 154* 168* 153* 150* 126* 136*  BUN 48* 35* 20 13 9 6   CREATININE 2.10* 1.12* 0.69 0.66 0.60 0.57  CALCIUM 7.7* 8.2* 8.2* 8.2* 8.2* 8.7  MG 2.1 2.1  --  1.8  --   --   PHOS 6.0* 3.7  --  3.1  --   --    Liver Function Tests:  Recent Labs Lab 10/09/12 1540  AST 14  ALT 9  ALKPHOS 34*  BILITOT 0.2*  PROT 3.2*  ALBUMIN 1.3*   CBC:  Recent Labs Lab 10/09/12 1540  10/10/12 0453 10/11/12 0440 10/12/12 0400 10/13/12 0500 10/14/12 0430  WBC 9.3  < > 8.2 9.4 6.9 9.2 8.0  NEUTROABS 7.4  --   --   --   --   --   --   HGB 7.4*  < > 8.9* 9.4* 9.0* 9.6* 9.2*  HCT 23.0*  < > 28.5* 29.6* 27.9* 30.2* 29.9*  MCV 86.8  < > 88.0 85.8 85.1 87.0 88.5  PLT 385  < > 466* 549* 482* 506* 486*  < > = values in this interval not displayed. Cardiac Enzymes:  Recent Labs Lab 10/09/12 2341 10/10/12 0453 10/10/12 1119  TROPONINI <0.30 <0.30 <0.30   BNP (last 3 results)  Recent Labs  03/23/12 0612 03/27/12 0627 09/12/12 0508  PROBNP 184.7* 100.1 25.8   CBG:  Recent Labs Lab 10/14/12 1607 10/14/12 2053 10/14/12 2328 10/15/12 0417 10/15/12 0759  GLUCAP 126*  123* 128* 121* 144*    Recent Results (from the past 240 hour(s))  CULTURE, BLOOD (ROUTINE X 2)     Status: None   Collection Time    10/09/12  4:00 PM      Result Value Range Status   Specimen Description BLOOD ARM RIGHT   Final   Special Requests BOTTLES DRAWN AEROBIC AND ANAEROBIC 10CC   Final   Culture  Setup Time     Final   Value: 10/09/2012 21:56     Performed at [facility]   Culture     Final   Value: NO GROWTH 5 DAYS  Performed at [facility]   Report Status 10/15/2012 FINAL   Final  CULTURE, BLOOD (ROUTINE X 2)     Status: None   Collection Time    10/09/12  4:20 PM      Result Value Range Status   Specimen Description BLOOD HAND LEFT   Final   Special Requests BOTTLES DRAWN AEROBIC ONLY 6CC   Final   Culture  Setup Time     Final   Value: 10/09/2012 21:56     Performed at [facility]   Culture     Final   Value: NO GROWTH 5 DAYS     Performed at [facility]   Report Status 10/15/2012 FINAL   Final  URINE CULTURE     Status: None   Collection Time    10/09/12 10:14 PM      Result Value Range Status   Specimen Description URINE, CATHETERIZED   Final   Special Requests NONE   Final   Culture  Setup Time     Final   Value: 10/09/2012 23:08     Performed at [facility] Count     Final   Value: NO GROWTH     Performed at [facility]   Culture     Final   Value: NO GROWTH     Performed at [facility]   Report Status 10/10/2012 FINAL   Final  CLOSTRIDIUM DIFFICILE BY PCR     Status: None   Collection Time    10/10/12  1:46 PM      Result Value Range Status   C difficile by pcr NEGATIVE  NEGATIVE Final     Studies:  Recent x-ray studies have been reviewed in detail by the Attending Physician  Scheduled Meds:  Scheduled Meds: . antiseptic oral rinse  15 mL Mouth Rinse q12n4p  . budesonide  0.25 mg Nebulization BID  . chlorhexidine  15 mL Mouth Rinse BID  . enoxaparin (LOVENOX)  injection  1 mg/kg Subcutaneous Q12H  . gabapentin  100 mg Oral TID  . insulin aspart  0-15 Units Subcutaneous TID WC  . rOPINIRole  1 mg Oral QHS  . saccharomyces boulardii  250 mg Oral BID  . sertraline  50 mg Oral Daily  . vancomycin  500 mg Oral QID   Followed by  . [START ON 10/27/2012] vancomycin  125 mg Oral BID   Followed by  . [START ON 11/04/2012] vancomycin  125 mg Oral Daily   Followed by  . [START ON 11/11/2012] vancomycin  125 mg Oral QODAY   Followed by  . [START ON 11/19/2012] vancomycin  125 mg Oral Q3 days    Time spent on care of this patient: 35 min   Emory Gallentine T, MD  [facility] Office  [phone] Pager - Text Page per Loretha Stapler as per below:  On-Call/Text Page:      [email or web address]      password [email or web address]  If 7PM-7AM, please contact night-coverage [email or web address] Password [facility] 10/15/2012, 11:44 AM   LOS: 6 days

## 2012-10-15 NOTE — Progress Notes (Signed)
Pt received to 2S14 via bed, O2 at [facility], portable telemetry monitor. Pt placed on monitor in room, question rhythm. Appears SVT, per report from previous unit pt in Afib. SBP>100, pt oriented x4. Pt with labored breathing, encouraged to take deep breathes and slow rate, increased o2 to [facility], respiratory notified of pt arrival to unit. Report given to oncoming 7p RN. Koren Bound

## 2012-10-15 NOTE — Plan of Care (Signed)
Problem: Phase I Progression Outcomes Goal: No Coumadin, ASA, or antiplatelet meds/48 hrs Outcome: Completed/Met Date Met:  10/15/12 Per Dr. Graciela Husbands, give patient first dose of Coumadin 10/15/2012.

## 2012-10-15 NOTE — Progress Notes (Addendum)
Report given to [facility] on 2300, to be transferrred to 2314 via bed, patient continues in SVT sustained ventricular response of 140-160 with systolic BP > 100, a/o x 4, daughter Velvet made aware via telephone of transfer bed location and tentative plan of care, Berle Mull RN

## 2012-10-15 NOTE — Progress Notes (Signed)
INFECTIOUS DISEASE ATTENDING ADDENDUM:     [facility]   Date: 10/15/2012  Patient name: [name]  Medical record number: [id number]  Date of birth: [birth year]    This patient has been seen and discussed with the house staff. Please see their note for complete details. I concur with their findings with the following additions/corrections:  While she still has very loose stools the volume documented has gone down considerably (5L first day to last charted)  I am NOT opposed to giving her DIFICID WHILE SHE IS IN HOUSE and remains ill, though todays problems appear more cardiogenic  She will be unlikely to afford the dificid as an outpatient and once she is ready for DC OR has finished 10 day course of dificid would change over to vancomycin to complete a 2 week course of HIGH DOSE THERAPY at 500mg  QID, followed by taper per protocol  Paulette Blanch Dam 10/15/2012, 6:06 PM

## 2012-10-15 NOTE — Progress Notes (Signed)
Patient Name: [name] Date of Encounter: 10/15/2012   Principal Problem:   Recurrent Clostridium difficile diarrhea Active Problems:   Cardiac arrest   COPD (chronic obstructive pulmonary disease)   Obesity   Diabetes mellitus type 2 in obese   Atrial fibrillation   Atrial flutter   Hyperlipidemia   Hypertension   Acute renal failure   SUBJECTIVE  When I initially saw pt this AM, she denied c/p, dyspnea, or palpitations.  She remains in aflutter with rates generally in low 100's during sleep, though rates climb into the 130's while awake.  While I was typing up my note, ID was in the room with her and she lost consciousness with ventricular standstill and underlying aflutter noted on the monitor.  CPR was about to be initiated when pt had ROSC with rates rising into the 130's - aflutter.  She fully regained consciousness and had no c/o cp or dyspnea.  Code blue was called off.  CURRENT MEDS . antiseptic oral rinse  15 mL Mouth Rinse q12n4p  . budesonide  0.25 mg Nebulization BID  . chlorhexidine  15 mL Mouth Rinse BID  . enoxaparin (LOVENOX) injection  1 mg/kg Subcutaneous Q12H  . gabapentin  100 mg Oral TID  . insulin aspart  0-15 Units Subcutaneous TID WC  . rOPINIRole  1 mg Oral QHS  . saccharomyces boulardii  250 mg Oral BID  . sertraline  50 mg Oral Daily  . vancomycin  500 mg Oral QID   Followed by  . [START ON 10/27/2012] vancomycin  125 mg Oral BID   Followed by  . [START ON 11/04/2012] vancomycin  125 mg Oral Daily   Followed by  . [START ON 11/11/2012] vancomycin  125 mg Oral QODAY   Followed by  . [START ON 11/19/2012] vancomycin  125 mg Oral Q3 days   OBJECTIVE  Filed Vitals:   10/14/12 2327 10/15/12 0419 10/15/12 0601 10/15/12 0810  BP: 116/76 117/62 117/62 117/65  Pulse:   137 110  Temp: 97.6 F (36.4 C) 97.8 F (36.6 C)  98 F (36.7 C)  TempSrc: Oral Oral  Oral  Resp:   15 14  Height:      Weight:  255 lb 11.7 oz (116 kg)    SpO2:   94% 100%     Intake/Output Summary (Last 24 hours) at 10/15/12 0926 Last data filed at 10/15/12 0600  Gross per 24 hour  Intake   2340 ml  Output   1875 ml  Net    465 ml   Filed Weights   10/12/12 0500 10/13/12 0500 10/15/12 0419  Weight: 248 lb 0.3 oz (112.5 kg) 257 lb (116.574 kg) 255 lb 11.7 oz (116 kg)   PHYSICAL EXAM  General: Pleasant, NAD. Neuro: Alert and oriented X 3. Moves all extremities spontaneously. Psych: Normal affect. HEENT:  Normal  Neck: Supple, obese, without bruits.  Difficult to assess jvp. Lungs:  Resp regular and unlabored, diminished breath sounds bilat. Heart: irreg, tachy, no s3, s4, or murmurs. Abdomen: Soft, obese, diffusely tender, edematous, BS + x 4.  Extremities: No clubbing, cyanosis.  2+ bilat LE edema. DP/PT/Radials 2+ and equal bilaterally.  Accessory Clinical Findings  CBC  Recent Labs  10/13/12 0500 10/14/12 0430  WBC 9.2 8.0  HGB 9.6* 9.2*  HCT 30.2* 29.9*  MCV 87.0 88.5  PLT 506* 486*   Basic Metabolic Panel  Recent Labs  10/13/12 0500 10/14/12 0430 10/15/12 0500  NA 141 140  139  K 4.0 3.9 4.2  CL 108 107 104  CO2 24 25 23   GLUCOSE 150* 126* 136*  BUN 13 9 6   CREATININE 0.66 0.60 0.57  CALCIUM 8.2* 8.2* 8.7  MG 1.8  --   --   PHOS 3.1  --   --    Thyroid Function Tests  Recent Labs  10/14/12 1245  TSH 2.447   TELE  Aflutter low 100's while sleeping, into 130's while awake. 50 seconds of ventricular standstill with underlying aflutter.  ASSESSMENT AND PLAN  1.  Afib/flutter with cardiac arrest this AM:  As above, pt had 50 seconds of ventricular standstill with ongoing atrial activity/flutter.  No meds given or CPR delivered during event.  With ROSC, she fully regained consciousness and did not require resp support.  This obviously complicates mgmt of her atrial arrhythmias further and thus we will ask electrophysiology to evaluate.  ? Vasovagal event.  Prior to event, she was telling staff about panic attacks that  she had been having.  She remains on Lorimor lovenox, though it is not clear, given compliance issues in the past, how good a long term anticoagulation candidate she would be going forward.  Hold Amio/bb pending EP eval.  2.  Recurrent C diff:  abx per IM/ID.  3.  AKI:  Improved.  4. Hypotension: BP's remain soft but overall improved.   5. Hypoalbuminemia: In setting of above. Albumin 1.3. With hydration, weight has been steadily climbing and she is fairly edematous. Lasix has been on hold secondary to #3. Add TED hose and will plan to resume lasix as creat remains stable.  6.  Anemia:  Stable.  Signed, Nicolasa Ducking NP

## 2012-10-16 ENCOUNTER — Encounter (HOSPITAL_COMMUNITY)
Admission: EM | Disposition: A | Discharge status: Skilled Nursing Facility | Payer: Self-pay | Source: Home / Self Care | Attending: Pulmonary Disease

## 2012-10-16 ENCOUNTER — Inpatient Hospital Stay (HOSPITAL_COMMUNITY): Payer: Medicare HMO

## 2012-10-16 DIAGNOSIS — I4891 Unspecified atrial fibrillation: Secondary | ICD-10-CM

## 2012-10-16 DIAGNOSIS — I498 Other specified cardiac arrhythmias: Secondary | ICD-10-CM

## 2012-10-16 DIAGNOSIS — I4892 Unspecified atrial flutter: Secondary | ICD-10-CM

## 2012-10-16 DIAGNOSIS — I369 Nonrheumatic tricuspid valve disorder, unspecified: Secondary | ICD-10-CM

## 2012-10-16 HISTORY — PX: PERMANENT PACEMAKER INSERTION: SHX5480

## 2012-10-16 LAB — CBC
HCT: 30.1 % — ABNORMAL LOW (ref 36.0–46.0)
MCHC: 30.2 g/dL (ref 30.0–36.0)
RDW: 19.5 % — ABNORMAL HIGH (ref 11.5–15.5)

## 2012-10-16 LAB — PROTIME-INR: INR: 1.16 (ref 0.00–1.49)

## 2012-10-16 LAB — COMPREHENSIVE METABOLIC PANEL
ALT: 9 U/L (ref 0–35)
AST: 9 U/L (ref 0–37)
Alkaline Phosphatase: 51 U/L (ref 39–117)
CO2: 28 mEq/L (ref 19–32)
Calcium: 8.5 mg/dL (ref 8.4–10.5)
Chloride: 103 mEq/L (ref 96–112)
GFR calc non Af Amer: >90 mL/min (ref >90)
Potassium: 3.9 mEq/L (ref 3.5–5.1)
Sodium: 140 mEq/L (ref 135–145)

## 2012-10-16 LAB — GLUCOSE, CAPILLARY

## 2012-10-16 SURGERY — PERMANENT PACEMAKER INSERTION
Anesthesia: LOCAL

## 2012-10-16 MED ORDER — FENTANYL CITRATE 0.05 MG/ML IJ SOLN
INTRAMUSCULAR | Status: AC
  Filled 2012-10-16: qty 2

## 2012-10-16 MED ORDER — VANCOMYCIN 50 MG/ML ORAL SOLUTION: 125.0000 mg | Freq: Four times a day (QID) | ORAL | Status: DC

## 2012-10-16 MED ORDER — VANCOMYCIN 50 MG/ML ORAL SOLUTION: 125.0000 mg | Freq: Every day | ORAL | Status: DC

## 2012-10-16 MED ORDER — METOPROLOL TARTRATE 1 MG/ML IV SOLN
INTRAVENOUS | Status: AC
  Filled 2012-10-16: qty 5

## 2012-10-16 MED ORDER — CEFAZOLIN SODIUM-DEXTROSE 2-3 GM-% IV SOLR
2.0000 g | Freq: Four times a day (QID) | INTRAVENOUS | Status: DC
  Filled 2012-10-16 (×2): qty 50

## 2012-10-16 MED ORDER — LIDOCAINE HCL (PF) 1 % IJ SOLN
INTRAMUSCULAR | Status: AC
  Filled 2012-10-16: qty 60

## 2012-10-16 MED ORDER — GERHARDT'S BUTT CREAM
TOPICAL_CREAM | CUTANEOUS | Status: DC | PRN
  Administered 2012-10-16: 17:00:00 via TOPICAL
  Filled 2012-10-16: qty 1

## 2012-10-16 MED ORDER — WARFARIN SODIUM 5 MG PO TABS
5.0000 mg | ORAL_TABLET | Freq: Once | ORAL | Status: AC
  Administered 2012-10-16: 5 mg via ORAL
  Filled 2012-10-16: qty 1

## 2012-10-16 MED ORDER — CEFAZOLIN SODIUM-DEXTROSE 2-3 GM-% IV SOLR
2.0000 g | Freq: Four times a day (QID) | INTRAVENOUS | Status: AC
  Administered 2012-10-16 – 2012-10-17 (×3): 2 g via INTRAVENOUS
  Filled 2012-10-16 (×3): qty 50

## 2012-10-16 MED ORDER — MIDAZOLAM HCL 5 MG/5ML IJ SOLN
INTRAMUSCULAR | Status: AC
  Filled 2012-10-16: qty 5

## 2012-10-16 MED ORDER — VANCOMYCIN 50 MG/ML ORAL SOLUTION: 125.0000 mg | ORAL | Status: DC

## 2012-10-16 MED ORDER — METOPROLOL TARTRATE 25 MG PO TABS
25.0000 mg | ORAL_TABLET | Freq: Two times a day (BID) | ORAL | Status: DC
  Administered 2012-10-16 – 2012-10-18 (×5): 25 mg via ORAL
  Filled 2012-10-16 (×8): qty 1

## 2012-10-16 MED ORDER — VANCOMYCIN 50 MG/ML ORAL SOLUTION: 125.0000 mg | Freq: Two times a day (BID) | ORAL | Status: DC

## 2012-10-16 MED ORDER — VANCOMYCIN 50 MG/ML ORAL SOLUTION
500.0000 mg | Freq: Four times a day (QID) | ORAL | Status: DC
  Administered 2012-10-16 – 2012-10-17 (×5): 500 mg via ORAL
  Filled 2012-10-16 (×8): qty 10

## 2012-10-16 MED ORDER — ONDANSETRON HCL 4 MG/2ML IJ SOLN
4.0000 mg | Freq: Four times a day (QID) | INTRAMUSCULAR | Status: DC | PRN
  Administered 2012-10-18: 4 mg via INTRAVENOUS
  Filled 2012-10-16: qty 2

## 2012-10-16 MED ORDER — ACETAMINOPHEN 325 MG PO TABS: 325.0000 mg | ORAL_TABLET | ORAL | Status: DC | PRN

## 2012-10-16 NOTE — Progress Notes (Addendum)
PULMONARY  / CRITICAL CARE MEDICINE  Name: [name] MRN: [id number] DOB: Jan 25, 1956    ADMISSION DATE:  10/09/2012 CONSULTATION DATE:  10/09/2012  REFERRING MD :  Vivia Ewing MD PRIMARY SERVICE: PCCM  CHIEF COMPLAINT:  Diarrhea  BRIEF PATIENT DESCRIPTION: 57 y/o female with a history of recurrent C.diff returned to the [facility] ED on 8/7 with confusion, diarrhea, subjective fevers, chills, and abdominal pain.  Found to be hypotensive with AKI.  BP initially improved with IVF in ED, but then shock. Admitted to 2100.    SIGNIFICANT EVENTS / STUDIES:  8/7 CXR > CVL in place, lungs clear 8/7 KUB > no obstruction, thick haustra consistent with colitis  LINES / TUBES: 8/7 R IJ CVL >>   CULTURES: 8/7 blood >> 8/7 urine >>negative  CDiff 8/8>>> negative  ANTIBIOTICS: 8/7 flagyl >>> 8/11 8/7 enteral vanc >>>  fidaxomicin 8/13 >>  Cefazolin 8/14 > post procedure  SUBJECTIVE/OVERNIGHT:  Moved back to the ICU 8/13 after episode of A flutter and ventricular non-conduction on amiodarone. This was prolonged but the patient was asymptomatic. She underwent urgent pacer placement this am 8/14 with plans to control her flutter medically. Still with diarrhea > 275 out 8/13, 100 so far today  VITAL SIGNS: Temp:  [97.2 F (36.2 C)-98.7 F (37.1 C)] 97.8 F (36.6 C) (08/14 1000) Pulse Rate:  [34-157] 108 (08/14 1130) Resp:  [6-28] 14 (08/14 1130) BP: (92-136)/(33-98) 114/69 mmHg (08/14 1130) SpO2:  [94 %-100 %] 94 % (08/14 1130) HEMODYNAMICS:   VENTILATOR SETTINGS:   INTAKE / OUTPUT: Intake/Output     08/13 0701 - 08/14 0700 08/14 0701 - 08/15 0700   P.O. 360    I.V. (mL/kg) 1921.3 (16.6) 150 (1.3)   Other 100    IV Piggyback 50    Total Intake(mL/kg) 2431.3 (21) 150 (1.3)   Urine (mL/kg/hr) 2125 (0.8) 225 (0.4)   Stool 275 (0.1)    Total Output 2400 225   Net +31.3 -75          PHYSICAL EXAMINATION: Gen: chronically ill appearing, obese HEENT: no jvd PULM: coarse CV:  s1 s2  rrt AB: BS+, soft, nontender, no hsm Ext: warm, 2+ edema, Derm: no rash or skin breakdown Neuro: awake, alert, appropriate, MAE   LABS:  CBC  Recent Labs Lab 10/13/12 0500 10/14/12 0430 10/16/12 0350  HGB 9.6* 9.2* 9.1*  HCT 30.2* 29.9* 30.1*  WBC 9.2 8.0 7.0  PLT 506* 486* 521*    Coag's Recent Labs     10/15/12  2313  INR  1.05    BMET  Recent Labs Lab 10/10/12 0453 10/11/12 0440 10/12/12 0400 10/13/12 0500 10/14/12 0430 10/15/12 0500 10/16/12 0350  NA 130* 136 136 141 140 139 140  K 4.4 4.2 3.8 4.0 3.9 4.2 3.9  CL 97 103 104 108 107 104 103  CO2 21 22 26 24 25 23 28   GLUCOSE 154* 168* 153* 150* 126* 136* 164*  BUN 48* 35* 20 13 9 6 6   CREATININE 2.10* 1.12* 0.69 0.66 0.60 0.57 0.59  CALCIUM 7.7* 8.2* 8.2* 8.2* 8.2* 8.7 8.5  MG 2.1 2.1  --  1.8  --   --  1.6  PHOS 6.0* 3.7  --  3.1  --   --   --     Electrolytes Recent Labs     10/14/12  0430  10/15/12  0500  10/16/12  0350  CALCIUM  8.2*  8.7  8.5  MG   --    --  1.6   Sepsis Markers Recent Labs     10/14/12  0430  PROCALCITON  0.13    ABG  Recent Labs Lab 10/09/12 2241 10/09/12 2251 10/10/12 0156 10/10/12 1554 10/11/12 0415  PHART  --  7.161* 7.248* 7.259* 7.289*  PCO2ART  --  66.3* 50.1* 49.6* 45.1*  PO2ART  --  88.0 108.0* 81.0 129.0*  HCO3 24.3* 23.7 21.9 22.3 20.9  TCO2 26 26 23 24  22.3  O2SAT 59.0 93.0 97.0 94.0 98.7   Liver Enzymes  Recent Labs Lab 10/09/12 1540 10/15/12 2313 10/16/12 0350  AST 14  --  9  ALT 9  --  9  ALKPHOS 34*  --  51  BILITOT 0.2*  --  0.1*  PROT 3.2*  --  4.7*  ALBUMIN 1.3*  --  2.1*  INR  --  1.05  --     Cardiac Enzymes No results found for this basename: TROPONINI, PROBNP,  in the last 72 hours Glucose Recent Labs     10/14/12  2328  10/15/12  0417  10/15/12  0759  10/15/12  1202  10/15/12  1621  10/15/12  2056  GLUCAP  128*  121*  144*  151*  172*  149*     ASSESSMENT / PLAN:   PULMONARY A: COPD, not in acute  exacerbation Mixed acidosis P:   -O2 as needed -continue home pulmicort and duoneb -consider symbicort or other combination inhaler (LABA/ICS) at discharge -prn albuterol -suspect OSA/OHS - refuses cpap / bipap  CARDIOVASCULAR A: Hypovolemic shock > improved with IVF.  Pressors off 8/9 Atrial fib/flutter Recent 50 second ventricular pause (in A flutter), s/p pacer 8/14 P:  - push volume to meet losses from diarrhea - SQ heparin for DVT prophylaxis - pacer in place, titration of rate controlling regimen per cardiology's plans, currently metoprolol 25 - coumadin started  RENAL A:  AKI due to hypovolemia >>> Resolved  P:   -continue pos balance to replace GI losses -may need to change to 1/2 NS, follow cl  GASTROINTESTINAL A:  Hx Recurrent C.diff, now with chronic colitis and C diff PCR negative Diarrhea  R/o rare false neg P:   -see ID -??etiology of diarrhea if CDiff truly neg  -DR Evette Cristal and Dr Algis Liming following. They have not recommended colonic bx at this time. -? Consider stool tx -clear diet  HEMATOLOGIC A:  Mild anemia  P:  -follow CBC  INFECTIOUS A:  Recurrent C.diff; CDiff neg this admit   P:   -cont Vanco + fidaxomicin -appreciate ID input; no plans for colon bx at this time. She does appear to be clinically improved -IMPORTANT issue at d/c will be med compliance with the C diff regimen  ENDOCRINE A:  DM2 P:   -SSI/monitor glucose  NEUROLOGIC A:  Delirium due to infection/AKI/hypovolemia; similar description to previous admissions - improving P:   -minimize sedating meds  -frequent orientation  To SDU 8/14 and back to [facility] as of 8/15.   Levy Pupa, MD, PhD 10/16/2012, 12:19 PM [facility] [phone] or if no answer [phone]

## 2012-10-16 NOTE — H&P (View-Only) (Signed)
 ELECTROPHYSIOLOGY CONSULT NOTE  Patient ID: [name] MRN: [id number], DOB/AGE: 11/02/1955   Admit date: 10/09/2012 Date of Consult: 10/15/2012  Primary Physician: FULLER,SUSAN, NP Primary Cardiologist: New to [place] Reason for Consultation: Atrial fibrillation / atrial flutter   History of Present Illness [name] is a 57 y.o. 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 She was found to be tachycardic 10/14/2012 and Cardiology was asked to evaluate. Metoprolol was added for rate control of atrial fibrillation / atrial flutter. Her rates have generally been in the 120s. She was asymptomatic. Despite the addition of BB, her rates were persistently elevated. Given her hypotension and renal failure, rate controlling options were limited; therefore the plan was made to use IV amiodarone short term for rate control. This morning she experienced bradycardic arrest with CHB, duration 50 seconds. She was talking with a medical student when she "just went out." Prior to initiation of CPR, she had ROSC and regained consciousness. She became hemodynamically stable and did not require BP or respiratory support. EP has been asked to see her for cardiac rhythm recommendations.  Currently she denies CP, SOB or palpitations. Amiodarone and BB have been discontinued. She denies any history of syncope. Her rate is in the upper 120s and BP is soft, systolic in 80s.  Of note, she has had a difficult year medically. 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 She was discharged in early Spring and following discharge had persistent foul smelling diarrhea. She was admitted 09/09/2012 with C diff complicated by hypotension, sepsis, and renal failure.  Past  Medical History Past Medical History  Diagnosis Date  . COPD (chronic obstructive pulmonary disease)   . Chronic respiratory failure   . Morbid obesity   . Tobacco abuse   . GERD (gastroesophageal reflux disease)   . Hyperlipidemia   . Chronic pain   . Hypertension   . Coronary vasospasm     a. 03/2004 NSTEMI/Cath: EF 45%, LM nl, LAD nl, D1 nl, D2 nl, LCX nl, OM1 large-nl, RCA-spasm noted, otw nl.  . Arthritis   . Allergic rhinitis   . Diabetes mellitus type 2 in obese 02/29/2012  . HTN (hypertension) 02/29/2012  . 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 recurrent in setting of abx noncompliance.  . Chronic kidney disease 09/2012    acute renal failure  . Shock 09/2012    Past Surgical History Past Surgical History  Procedure Laterality Date  . Cholecystectomy    . Partial hysterectomy    . Angioplasty    . Umbilical hernia repair  12/2009    Allergies/Intolerances Allergies  Allergen Reactions  . Ambien [Zolpidem Tartrate] Other (See Comments)    Severe hallucinations and behavior changes reported by family  . Ciprofloxacin     PATIENT AT VERY HIGH RISK FOR C DIFFICILE COLITIS  . Cardizem [Diltiazem Hcl] Swelling  . Lunesta [Eszopiclone] Other (See Comments)    Nightmares & hallucinations  . Metformin And Related Swelling  . Protonix [Pantoprazole Sodium] Other (See Comments)    Unknown reaction-"Doctor told me to never take it"   Inpatient Medications . antiseptic oral rinse  15 mL Mouth Rinse q12n4p  . budesonide  0.25 mg Nebulization BID  . chlorhexidine  15 mL Mouth Rinse BID  . enoxaparin (LOVENOX) injection    1 mg/kg Subcutaneous Q12H  . gabapentin  100 mg Oral TID  . insulin aspart  0-15 Units Subcutaneous TID WC  . rOPINIRole  1 mg Oral QHS  . saccharomyces boulardii  250 mg Oral BID  . sertraline  50 mg Oral Daily  . vancomycin  500 mg Oral QID   Followed by  . [START ON 10/27/2012] vancomycin  125 mg Oral BID   Followed by  . [START ON 11/04/2012] vancomycin   125 mg Oral Daily   Followed by  . [START ON 11/11/2012] vancomycin  125 mg Oral QODAY   Followed by  . [START ON 11/19/2012] vancomycin  125 mg Oral Q3 days   . sodium chloride 100 mL/hr at 10/13/12 1154   Family History Positive for CAD   Social History Social History  . Marital Status: Married   Occupational History  . Server     Restaurant   Social History Main Topics  . Smoking status: Former Smoker -- 2.00 packs/day for 40 years    Types: Cigarettes    Quit date: 03/04/2012  . Smokeless tobacco: Never Used  . Alcohol Use: No  . Drug Use: No   Review of Systems General: No chills, fever, night sweats or weight changes  Cardiovascular:  No chest pain, dyspnea on exertion, edema, orthopnea, palpitations, paroxysmal nocturnal dyspnea Dermatological: No rash, lesions or masses Respiratory: No cough, dyspnea Urologic: No hematuria, dysuria Abdominal: No nausea, vomiting, diarrhea, bright red blood per rectum, melena, or hematemesis Neurologic: No visual changes, weakness, changes in mental status All other systems reviewed and are otherwise negative except as noted above.  Physical Exam Vitals: Blood pressure 130/80, pulse 120, temperature 98.1 F (36.7 C), temperature source Oral, resp. rate 16, height 5' 5" (1.651 m), weight 255 lb 11.7 oz (116 kg), SpO2 99.00%.  General: Well developed, chronically ill appearing  57 y.o. female in no acute distress. HEENT: Normocephalic, atraumatic. EOMs intact. Sclera nonicteric. Oropharynx clear.  Neck: Supple. Difficult to assess JVD. Lungs: Respirations regular and unlabored. Decreased breath sounds throughout. No wheezes, rales or rhonchi. Heart: Irregular. S1, S2 present. No murmurs, rub, S3 or S4. Abdomen: Obese. Mildly distended. BS present x 4 quadrants.  Extremities: No clubbing or cyanosis. 2+ LE edema. DP/PT/Radials 2+ and equal bilaterally. Psych: Normal affect. Neuro: Alert and oriented X 3. Moves all extremities  spontaneously. Musculoskeletal: No kyphosis. Skin: Intact. Warm and dry. No rashes or petechiae in exposed areas.   Labs Lab Results  Component Value Date   WBC 8.0 10/14/2012   HGB 9.2* 10/14/2012   HCT 29.9* 10/14/2012   MCV 88.5 10/14/2012   PLT 486* 10/14/2012    Recent Labs Lab 10/09/12 1540  10/15/12 0500  NA 133*  < > 139  K 3.3*  < > 4.2  CL 103  < > 104  CO2 21  < > 23  BUN 38*  < > 6  CREATININE 1.66*  < > 0.57  CALCIUM 6.2*  < > 8.7  PROT 3.2*  --   --   BILITOT 0.2*  --   --   ALKPHOS 34*  --   --   ALT 9  --   --   AST 14  --   --   GLUCOSE 166*  < > 136*  < > = values in this interval not displayed. No components found with this basename: MAGNESIUM,   Recent Labs  10/14/12 1245  TSH 2.447    Radiology/Studies Dg Chest   Port 1 View  10/09/2012   *RADIOLOGY REPORT*  Clinical Data: Shortness of breath  PORTABLE CHEST - 1 VIEW  Comparison: 10/09/2012  Findings: Left IJ central line tip at the mid SVC level.  Stable heart size and vascularity.  Exam is rotated to the left.  No new airspace disease, collapse, or consolidation.  No effusion or pneumothorax.  Chronic small right diaphragmatic hernia as before.  IMPRESSION: Stable exam.  No superimposed acute process   Original Report Authenticated By: M. Shick, M.D.   Dg Chest Port 1 View  10/09/2012   *RADIOLOGY REPORT*  Clinical Data: Diarrhea, pain, hypotension.  PORTABLE CHEST - 1 VIEW  Comparison: 09/14/2012.  The CT chest 03/24/2012.  Findings: Trachea is midline.  Heart size is grossly stable.  Mild asymmetry of the right hilum.  Lungs are low in volume with streaky densities at the lung bases.  No definite pleural fluid.  IMPRESSION:  1.  Low lung volumes with streaky densities at the lung bases, possibly representing atelectasis or scarring. 2.  Question mild asymmetry of the right hilum.  Attention on follow-up exams is warranted.   Original Report Authenticated By: Melinda Blietz, M.D.   Echocardiogram Mar 06, 2012 Study Conclusions - Left ventricle: Echo dencse apical mass that may represent a calcified false tendon or a calcified thrombus. Clinical correlatio recommended. The cavity size was normal. Systolic function was normal. The estimated ejection fraction was in the range of 50% to 55%. - Right ventricle: The cavity size was moderately dilated. Wall thickness was normal. Systolic function was reduced. - Pulmonary arteries: Systolic pressure was mildly increased. PA peak pressure: 32mm Hg (S). Follow-up limited echo Mar 07, 2012 Impression: - Limited study to R/O apical thrombus; definity used; no obvious apical thrombus.   12-lead ECG on 10/11/2012 shows rapid atrial flutter with RBBB Telemetry shows AFib/flutter with RVR, rates in 120s most often; this AM with bradycardic arrest - atrial flutter with complete heart block  Assessment and Plan 1. 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 Atrial fibrillation / flutter  - newly diagnosed - update echo - difficult to rate control in setting of ARF and hypotension, now with CHB on amio and BB 3. Hypotension  4. Recurrent C.diff colitis 5. ARF 6. Anemia  Dr. Klein to see. Please see recommendations below. Signed, EDMISTEN, BROOKE, PA-C 10/15/2012, 1:13 PM  The patient has intermittent complete heart block in the setting of long-standing bifascicular block. Her course was further complicated by new onset atrial fibrillation/flutter with a rapid ventricular response prompting beta blocker therapy which was present for 3 days before the complete heart block episode. She also had one dose of amiodarone.  This occurs in the context of recurrent C. difficile colitis which has been hemodynamically significant as manifested by recurrent episodes of prerenal azotemia and hypotension and in one account significant  leukocytosis.  Previous echocardiogram January 14 demonstrated normal left ventricular function and  normal left atrial dimension and as she is unaware of her atrial fibrillation/flutter by palpitations we don't have an idea as to how much of this is been a problem in the past.  Unfortunately, I think our options are quite limited. With intermittent complete heart block in the setting of chronic bifascicular block that occurs in the absence of any identifiable trigger, i.e. she was sitting there talking to the nurse and the medical student,   I think pacing as her only option. This will allow then aggressive rate control and then subacute cardioversion if needed for restoration of sinus rhythm  Given her Prolonged Exposure to antibiotics, will undertake this using an AEGIS pouch.   We will begin her on warfarin tonight. We will commence for rate control tomorrow following pacing. In the event that we cannot control her rate, we'll anticipate TE guided cardioversion next week following therapeutic INR    

## 2012-10-16 NOTE — Plan of Care (Signed)
Problem: ICU Phase Progression Outcomes Goal: Hemodynamically stable Outcome: Progressing Pacemaker placed. HR 120s, MD aware, lopressor started. BP stable

## 2012-10-16 NOTE — Progress Notes (Addendum)
Dr. Graciela Husbands called and gave orders to d/c lovenox, start Coumadin, and SCDs. Informed me that [name] is scheduled in EP lab at 0730. PA to come over in AM prior to patient going to EP lab to speak to patient and her daughter about pacemaker and procedure. Will get consent signed at that time.  Called and spoke with patient's daughter [name] and told her about EP lab time.   Thresa Ross RN

## 2012-10-16 NOTE — Progress Notes (Signed)
Patient received back to 2S14 from EP lab. Pace maker to left upper chest. Dressing intact with marked shadowing. Sling placed to left arm to restrict movement. Pt instructed not to lift arm up or over head and instructed cannot turn to the left side. Bedrest. Pt states understanding. Pt resting, arouses easily, follows commands, oriented. Pt remains in afib with rate 100-130s. Will continue to monitor. Koren Bound

## 2012-10-16 NOTE — Progress Notes (Addendum)
Pt refuses CPAP or BIPAP qHS tonight, due to not liking confined space.  Pt currently tolerating 4LNC well, with oxygen saturation of 99%.

## 2012-10-16 NOTE — Progress Notes (Signed)
OT Cancellation Note  Patient Details Name: [name] MRN: [id number] DOB: [birth year]   Cancelled Treatment:    Reason Eval/Treat Not Completed: Medical issues which prohibited therapy;Patient not medically ready (bedrest s/p pacemaker placement)  [facility], OTR/L  [phone] 10/16/2012 10/16/2012, 4:23 PM

## 2012-10-16 NOTE — Progress Notes (Signed)
  Echocardiogram 2D Echocardiogram has been performed.  [name] 10/16/2012, 11:26 AM

## 2012-10-16 NOTE — Progress Notes (Signed)
ANTICOAGULATION CONSULT NOTE - Follow Up Consult  Pharmacy Consult for Coumadin Indication: atrial fibrillation  Allergies  Allergen Reactions  . Ambien [Zolpidem Tartrate] Other (See Comments)    Severe hallucinations and behavior changes reported by family  . Ciprofloxacin     PATIENT AT VERY HIGH RISK FOR C DIFFICILE COLITIS  . Cardizem [Diltiazem Hcl] Swelling  . Lunesta [Eszopiclone] Other (See Comments)    Nightmares & hallucinations  . Metformin And Related Swelling  . Protonix [Pantoprazole Sodium] Other (See Comments)    Unknown reaction-"Doctor told me to never take it"    Patient Measurements: Height: 5\' 5"  (165.1 cm) Weight: 255 lb 11.7 oz (116 kg) IBW/kg (Calculated) : 57  Vital Signs: Temp: 98.6 F (37 C) (08/14 1243) Temp src: Oral (08/14 1243) BP: 117/67 mmHg (08/14 1300) Pulse Rate: 136 (08/14 1300)  Labs:  Recent Labs  10/14/12 0430 10/15/12 0500 10/15/12 2313 10/16/12 0350 10/16/12 1243  HGB 9.2*  --   --  9.1*  --   HCT 29.9*  --   --  30.1*  --   PLT 486*  --   --  521*  --   LABPROT  --   --  13.5  --  14.6  INR  --   --  1.05  --  1.16  CREATININE 0.60 0.57  --  0.59  --     Estimated Creatinine Clearance: 97.5 ml/min (by C-G formula based on Cr of 0.59).  Assessment: 57yof started on coumadin last night for new afib/flutter. She is s/p pacemaker today. Was on therapeutic lovenox prior to pacemaker implantation and no orders received to resume after the procedure. I paged Dr. Ladona Ridgel to clarify but did not get a call back. INR is 1.16 after first dose of coumadin. Hgb is low but fairly stable.  Goal of Therapy:  INR 2-3 Monitor platelets by anticoagulation protocol: Yes   Plan:  1) Repeat coumadin 5mg  x 1 tonight 2) INR, CBC in AM  Fredrik Rigger 10/16/2012,2:17 PM

## 2012-10-16 NOTE — Progress Notes (Signed)
PT Cancellation Note  Patient Details Name: [name] MRN: [id number] DOB: September 02, 1955   Cancelled Treatment:    Reason Eval/Treat Not Completed: Patient at procedure or test/unavailable (pt receiving pacemaker currently)   Toney Sang [facility] 10/16/2012, 8:15 AM Delaney Meigs, PT [phone]

## 2012-10-16 NOTE — Op Note (Signed)
NAMEKIMMI, ACOCELLA NO.:  [id number]  MEDICAL RECORD NO.:  [id number]  LOCATION:  2S14C                        FACILITY:  [facility]  PHYSICIAN:  Doylene Canning. Ladona Ridgel, MD    DATE OF BIRTH:  [birth year]  DATE OF PROCEDURE:  10/16/2012 DATE OF DISCHARGE:                              OPERATIVE REPORT   PROCEDURE PERFORMED:  Insertion of a dual-chamber pacemaker.  INDICATIONS:  Symptomatic bradycardia in the setting of atrial fibrillation and flutter with a rapid ventricular response.  INTRODUCTION:  The patient is a 57 year old woman who developed influenza and superinfection with pneumococcal pneumonia several months ago.  She underwent a prolonged hospital course.  After discharge, she developed severe diarrhea secondary to C. diff.  She was hospitalized with worsening diarrhea and was found to be in atrial flutter with a rapid ventricular response.  She has also had atrial fibrillation.  On medical therapy, she developed a 50-second pause, which required initiation of CPR.  This was due to complete heart block.  At other times, and after this she has had atrial flutter with a very rapid ventricular response at rates of 155 beats per minute.  Because of her transient AV block, and because of her rapid atrial fib and flutter, and no reversible causes to treat both, she is referred for insertion of a dual-chamber pacemaker, with plans going forward to cardiovert her back to normal rhythm when she has been therapeutically anticoagulated.  PROCEDURE:  After informed consent was obtained, the patient was taken to the diagnostic EP lab in a fasting state.  After usual preparation and draping, intravenous fentanyl and midazolam were given for sedation. 30 mL of lidocaine was infiltrated into the left infraclavicular region and a 5-cm incision was carried out over this region and electrocautery was utilized to dissect down to the fascial plane.  The left subclavian vein was  then punctured x2.  The Medtronic model 5076 52 cm active fixation pacing lead, serial number [id number] was advanced into the right ventricle.  It should be noted that placement of the ventricular lead was made more difficult by the patient's tricuspid regurgitation resulting in several instances where the lead was ejected back into the right atrium despite being actively fixed.  At the final site, the lead appeared to be stable.  There was a satisfactory injury current, R-waves were 8 and the pacing impedance was 540 ohms and the threshold was less than a V at 0.5 milliseconds.  Attention was then turned to placement of the atrial lead, which was placed in the anterolateral portion of the right atrium, where fibrillation waves measured between 0.8 at 2 mV. With the lead actively fixed, the pacing impedance was 470 ohms.  With both the atrial and ventricular leads appear in satisfactory position, and actively fixed, they were secured to the subpectoral fascia with figure-of-eight silk suture.  The sewing sleeve was secured with silk suture as well.  Electrocautery was utilized to make a subcutaneous pocket.  Antibiotic irrigation was utilized to irrigate the pocket. Once the leads were in position, the patient was treated with IV Lopressor and her heart rate came down from the 150s to the 110s and 120s.  Hemodynamically she tolerated this very well.  At this point, the Medtronic Adapta L dual-chamber pacemaker, serial number [id number] was connected to the atrial and the RV lead.  An antibiotic patch was then placed around the pacemaker generator and leads and placed back in the subcutaneous pocket.  The pocket was irrigated with antibiotic irrigation and the incision was closed with 2-0 and 3-0 Vicryl.  Benzoin and Steri-Strips were painted on the skin and a pressure dressing was applied and the patient was returned to her room in satisfactory condition.  COMPLICATIONS:  There were  no immediate procedure complications.  RESULTS:  This demonstrates successful implantation of a Medtronic dual- chamber pacemaker in a patient with symptomatic pauses of up to 50 seconds.     Doylene Canning. Ladona Ridgel, MD     GWT/MEDQ  D:  10/16/2012  T:  10/16/2012  Job:  [id number]  cc:   Duke Salvia, MD, [facility]

## 2012-10-16 NOTE — Progress Notes (Signed)
Dr Delton Coombes updated at bedside. MD updated pt with successful pace maker. Pt remains afib with rate 100-130s. SBP>90. MIVF (NS) @ 75cc/hr. Pt tolerating sip/chips, MD ok to advance diet to clears. MD updated cardiology called, orders pending to assist with HR. MD updated daughter via phone and pt at bedside. Will continue to monitor. Koren Bound

## 2012-10-16 NOTE — Progress Notes (Signed)
Chart review complete.  Patient is not eligible for [facility] Care Management services because his/her PCP is not a [facility] primary care provider or is not [facility] affiliated.  For any additional questions or new referrals please contact Tim Henderson BSN RN [facility] Liaison at [phone] °

## 2012-10-16 NOTE — Progress Notes (Signed)
Sherry Ducking NP updated pt status. Updated pt returned to 2S14 from EP lab ~0945. Pt drowsy. Successful pacemaker placement with Dr Ladona Ridgel. Pt remains in Afib with rate 100-120s. Per report from EP lab pt received lopressor during procedure. Updated SBP>90, on 4LNC. Will continue to monitor. Koren Bound

## 2012-10-16 NOTE — Progress Notes (Signed)
Patient refused to wear CPAP tonight, stating she's "too claustrophobic, I would be too anxious" to wear.  Patient advised to inform RN or RT should she change her mind.

## 2012-10-16 NOTE — CV Procedure (Signed)
DDD PM implanted via the left subclavian vein without immediate complication. J#[id number].

## 2012-10-16 NOTE — Interval H&P Note (Signed)
History and Physical Interval Note:Patient seen and examined by me personally. I have discussed the situation with the patient and her family. She had 50 seconds of asystole yesterday with underlying atrial flutter/fib and now has atrial flutter with an RVR of 145/min. The risks/benefits/goals/expectations of PPM insertion has been discussed with the patient and she wishes to proceed.  10/16/2012 7:11 AM  Hermelinda Medicus  has presented today for surgery, with the diagnosis of Heart block  The various methods of treatment have been discussed with the patient and family. After consideration of risks, benefits and other options for treatment, the patient has consented to  Procedure(s): PERMANENT PACEMAKER INSERTION (N/A) as a surgical intervention .  The patient's history has been reviewed, patient examined, no change in status, stable for surgery.  I have reviewed the patient's chart and labs.  Questions were answered to the patient's satisfaction.     Leonia Reeves.D.

## 2012-10-16 NOTE — Progress Notes (Addendum)
[facility]    Subjective: Patient is s/p pacemaker placement this morning by Dr. Ladona Ridgel with no immediate complications. She continues to complain of her "anxiety and panic attack" as well as her difficulty/labored breathing. She refused CPAP last night. Currently she is in moderate pain laying on her right side in bed. Her foley and rectal tube are still in place and draining effectively. She denies fevers, chills, sweats, N/V.   Antibiotics:  Anti-infectives   Start     Dose/Rate Route Frequency Ordered Stop   11/19/12 1000  vancomycin (VANCOCIN) 50 mg/mL oral solution 125 mg  Status:  Discontinued     125 mg Oral Every 3 DAYS 10/13/12 1653 10/15/12 1809   11/11/12 1000  vancomycin (VANCOCIN) 50 mg/mL oral solution 125 mg  Status:  Discontinued     125 mg Oral Every other day 10/13/12 1653 10/15/12 1809   11/04/12 1000  vancomycin (VANCOCIN) 50 mg/mL oral solution 125 mg  Status:  Discontinued     125 mg Oral Daily 10/13/12 1653 10/15/12 1809   10/27/12 2200  vancomycin (VANCOCIN) 50 mg/mL oral solution 125 mg  Status:  Discontinued     125 mg Oral 2 times daily 10/13/12 1653 10/15/12 1809   10/16/12 1400  ceFAZolin (ANCEF) IVPB 2 g/50 mL premix     2 g 100 mL/hr over 30 Minutes Intravenous Every 6 hours 10/16/12 1215 10/17/12 0759   10/16/12 1300  vancomycin (VANCOCIN) 50 mg/mL oral solution 500 mg     500 mg Oral 4 times per day 10/16/12 1207     10/16/12 1200  ceFAZolin (ANCEF) IVPB 2 g/50 mL premix  Status:  Discontinued     2 g 100 mL/hr over 30 Minutes Intravenous 4 times per day 10/16/12 0951 10/16/12 1215   10/16/12 0600  gentamicin (GARAMYCIN) 80 mg in sodium chloride irrigation 0.9 % 500 mL irrigation  Status:  Discontinued     80 mg Irrigation On call 10/15/12 1920 10/16/12 0951   10/16/12 0600  ceFAZolin (ANCEF) IVPB 2 g/50 mL premix     2 g 100 mL/hr over 30 Minutes Intravenous On call 10/15/12 1920 10/16/12 0649   10/15/12 2200   fidaxomicin (DIFICID) tablet 200 mg     200 mg Oral 2 times daily 10/15/12 1809     10/13/12 1830  vancomycin (VANCOCIN) 50 mg/mL oral solution 500 mg  Status:  Discontinued     500 mg Oral 4 times daily 10/13/12 1653 10/15/12 1809   10/13/12 1400  metroNIDAZOLE (FLAGYL) IVPB 500 mg  Status:  Discontinued     500 mg 100 mL/hr over 60 Minutes Intravenous Every 8 hours 10/13/12 1110 10/14/12 1724   10/10/12 1000  metroNIDAZOLE (FLAGYL) tablet 500 mg  Status:  Discontinued     500 mg Oral 3 times per day 10/10/12 0235 10/13/12 1110   10/10/12 0800  vancomycin (VANCOCIN) 50 mg/mL oral solution 500 mg  Status:  Discontinued     500 mg Oral 4 times per day 10/10/12 0239 10/13/12 1650   10/10/12 0000  vancomycin (VANCOCIN) 50 mg/mL oral solution 500 mg  Status:  Discontinued     500 mg Oral 4 times per day 10/09/12 2135 10/10/12 0239   10/09/12 2200  metroNIDAZOLE (FLAGYL) tablet 500 mg  Status:  Discontinued     500 mg Oral 3 times per day 10/09/12 2135 10/10/12 0235   10/09/12 1545  metroNIDAZOLE (FLAGYL) IVPB 500 mg  500 mg 100 mL/hr over 60 Minutes Intravenous  Once 10/09/12 1539 10/09/12 1721      Medications: Scheduled Meds: . antiseptic oral rinse  15 mL Mouth Rinse q12n4p  . budesonide  0.25 mg Nebulization BID  .  ceFAZolin (ANCEF) IV  2 g Intravenous Q6H  . chlorhexidine  15 mL Mouth Rinse BID  . fidaxomicin  200 mg Oral BID  . gabapentin  100 mg Oral TID  . insulin aspart  0-15 Units Subcutaneous TID WC  . metoprolol tartrate  25 mg Oral BID  . rOPINIRole  1 mg Oral QHS  . saccharomyces boulardii  250 mg Oral BID  . sertraline  50 mg Oral Daily  . vancomycin  500 mg Oral Q6H  . Warfarin - Pharmacist Dosing Inpatient   Does not apply q1800   Continuous Infusions: . sodium chloride 75 mL/hr (10/16/12 1024)   PRN Meds:.acetaminophen, Gerhardt's butt cream, levalbuterol, ondansetron (ZOFRAN) IV, oxyCODONE   Objective: Weight change:   Intake/Output Summary (Last 24  hours) at 10/16/12 1307 Last data filed at 10/16/12 1200  Gross per 24 hour  Intake 1784.58 ml  Output   2400 ml  Net -615.42 ml   Blood pressure 117/74, pulse 115, temperature 98.6 F (37 C), temperature source Oral, resp. rate 11, height 5\' 5"  (1.651 m), weight 116 kg (255 lb 11.7 oz), SpO2 98.00%. Temp:  [97.2 F (36.2 C)-98.7 F (37.1 C)] 98.6 F (37 C) (08/14 1243) Pulse Rate:  [34-157] 115 (08/14 1200) Resp:  [6-28] 11 (08/14 1200) BP: (92-136)/(33-98) 117/74 mmHg (08/14 1200) SpO2:  [94 %-100 %] 98 % (08/14 1200)  Physical Exam: General: Alert and awake, oriented x3, not in any acute distress. HEENT: anicteric sclera, pupils reactive to light and accommodation, EOMI CVS regular rate, normal r,  no murmur rubs or gallops Chest: coarse breath sounds Abdomen: obese, soft, nontender, nondistended, normal bowel sounds Extremities: no clubbing or edema noted bilaterally Skin: no rashes Lymph: no new lymphadenopathy Neuro: nonfocal  Lab Results:  Recent Labs  10/14/12 0430 10/16/12 0350  WBC 8.0 7.0  HGB 9.2* 9.1*  HCT 29.9* 30.1*  PLT 486* 521*    BMET  Recent Labs  10/15/12 0500 10/16/12 0350  NA 139 140  K 4.2 3.9  CL 104 103  CO2 23 28  GLUCOSE 136* 164*  BUN 6 6  CREATININE 0.57 0.59  CALCIUM 8.7 8.5    Micro Results: Recent Results (from the past 240 hour(s))  CULTURE, BLOOD (ROUTINE X 2)     Status: None   Collection Time    10/09/12  4:00 PM      Result Value Range Status   Specimen Description BLOOD ARM RIGHT   Final   Special Requests BOTTLES DRAWN AEROBIC AND ANAEROBIC 10CC   Final   Culture  Setup Time     Final   Value: 10/09/2012 21:56     Performed at [facility]   Culture     Final   Value: NO GROWTH 5 DAYS     Performed at [facility]   Report Status 10/15/2012 FINAL   Final  CULTURE, BLOOD (ROUTINE X 2)     Status: None   Collection Time    10/09/12  4:20 PM      Result Value Range Status   Specimen  Description BLOOD HAND LEFT   Final   Special Requests BOTTLES DRAWN AEROBIC ONLY 6CC   Final   Culture  Setup Time  Final   Value: 10/09/2012 21:56     Performed at [facility]   Culture     Final   Value: NO GROWTH 5 DAYS     Performed at [facility]   Report Status 10/15/2012 FINAL   Final  URINE CULTURE     Status: None   Collection Time    10/09/12 10:14 PM      Result Value Range Status   Specimen Description URINE, CATHETERIZED   Final   Special Requests NONE   Final   Culture  Setup Time     Final   Value: 10/09/2012 23:08     Performed at [facility]   Colony Count     Final   Value: NO GROWTH     Performed at [facility]   Culture     Final   Value: NO GROWTH     Performed at [facility]   Report Status 10/10/2012 FINAL   Final  CLOSTRIDIUM DIFFICILE BY PCR     Status: None   Collection Time    10/10/12  1:46 PM      Result Value Range Status   C difficile by pcr NEGATIVE  NEGATIVE Final    Studies/Results: Dg Chest Portable 1 View  10/16/2012   *RADIOLOGY REPORT*  Clinical Data: Post implant, rule out pneumothorax  PORTABLE CHEST - 1 VIEW  Comparison: 10/09/2012  Findings: Borderline cardiomegaly.  There is a dual lead cardiac pacemaker with left subclavian approach with leads in the right atrium and right ventricle.  Linear atelectasis or early infiltrate noted in the lingula.  There is small right pleural effusion with right basilar atelectasis or infiltrate.  There is a right IJ central line with tip in upper SVC.  No diagnostic pneumothorax.  IMPRESSION:   There is a dual lead cardiac pacemaker with left subclavian approach with leads in the right atrium and right ventricle. Linear atelectasis or early infiltrate noted in the lingula.  There is small right pleural effusion with right basilar atelectasis or infiltrate.  There is a right IJ central line with tip in upper SVC.  No diagnostic pneumothorax.   Original Report  Authenticated By: Natasha Mead, M.D.      Assessment/Plan: ELLEN GORIS is a 57 y.o. female with recurrent C. Diff colitis, afib/flutter with RVR and recent episode of CHB and arrest yesterday and now is s/p pacemaker this am. Her rectal tube output is much lower than admission (100cc today vs. 4500cc).   Abx: Flagyl (8/8>>8/12) Vanc PO (8/8>> Fidaxomicin (8/13>>  C. Diff colitis:  --fidaxomicin began yesterday, will continue while inpt up to 10 day course, FOLLOWED by vancomycin taper over next several weeks --continue florastor --will refer her to Dr. Drue Second and Dr. Leone Payor for future stool transplant    LOS: 7 days   Lewie Chamber 10/16/2012, 1:07 PM  INFECTIOUS DISEASE ATTENDING ADDENDUM:     [facility]   Date: 10/16/2012  Patient name: [name]  Medical record number: [id number]  Date of birth: [birth year]    This patient has been seen and discussed with the house staff. Please see their note for complete details. I concur with their findings with the following additions/corrections:  She just had device put in by Dr. Ladona Ridgel. Diarrhea is improved.  We will have her finish 10 day course of fidaxomicin  AFTER SHE RECEIVES HER LAST DOSE OF ANCEF  (IF she is still in the  midst of her Dificid rx and ready for DC would change her to high dose vancomycin 500mg  qid to finish THIS part of the treatment as I doubt she will be able get this as an outpatient)   followed by vancomycin taper:  Which would be   125 mg orally four times  7 days  Followed by  125 mg orally twice daily for 7 days 125 mg orally once daily for 7 days 125 mg orally every other day for 7 days 125 mg orally every 3 days for 14 days  Continue florastor  I will sign off for now  Please call further questions  Acey Lav 10/16/2012, 4:38 PM

## 2012-10-17 DIAGNOSIS — Z950 Presence of cardiac pacemaker: Secondary | ICD-10-CM

## 2012-10-17 LAB — BASIC METABOLIC PANEL
GFR calc Af Amer: >90 mL/min (ref >90)
GFR calc non Af Amer: >90 mL/min (ref >90)
Glucose, Bld: 175 mg/dL — ABNORMAL HIGH (ref 70–99)
Potassium: 3.5 mEq/L (ref 3.5–5.1)
Sodium: 141 mEq/L (ref 135–145)

## 2012-10-17 LAB — GLUCOSE, CAPILLARY
Glucose-Capillary: 162 mg/dL — ABNORMAL HIGH (ref 70–99)
Glucose-Capillary: 157 mg/dL — ABNORMAL HIGH (ref 70–99)
Glucose-Capillary: 133 mg/dL — ABNORMAL HIGH (ref 70–99)

## 2012-10-17 LAB — CBC
Hemoglobin: 8.9 g/dL — ABNORMAL LOW (ref 12.0–15.0)
RBC: 3.3 MIL/uL — ABNORMAL LOW (ref 3.87–5.11)

## 2012-10-17 LAB — PROTIME-INR: Prothrombin Time: 19.8 seconds — ABNORMAL HIGH (ref 11.6–15.2)

## 2012-10-17 MED ORDER — WARFARIN SODIUM 2.5 MG PO TABS
2.5000 mg | ORAL_TABLET | Freq: Once | ORAL | Status: AC
  Administered 2012-10-17: 2.5 mg via ORAL
  Filled 2012-10-17: qty 1

## 2012-10-17 MED ORDER — OXYCODONE HCL 5 MG PO TABS
5.0000 mg | ORAL_TABLET | Freq: Four times a day (QID) | ORAL | Status: DC
  Administered 2012-10-17 – 2012-10-19 (×8): 5 mg via ORAL
  Filled 2012-10-17 (×8): qty 1

## 2012-10-17 MED ORDER — VANCOMYCIN 50 MG/ML ORAL SOLUTION: 125.0000 mg | Freq: Two times a day (BID) | ORAL | Status: DC

## 2012-10-17 MED ORDER — VANCOMYCIN 50 MG/ML ORAL SOLUTION: 125.0000 mg | ORAL | Status: DC

## 2012-10-17 MED ORDER — BOOST / RESOURCE BREEZE PO LIQD
1.0000 | Freq: Three times a day (TID) | ORAL | Status: DC
  Administered 2012-10-18 – 2012-10-23 (×10): 1 via ORAL

## 2012-10-17 MED ORDER — VANCOMYCIN 50 MG/ML ORAL SOLUTION: 125.0000 mg | Freq: Every day | ORAL | Status: DC

## 2012-10-17 MED ORDER — MORPHINE SULFATE 2 MG/ML IJ SOLN
1.0000 mg | INTRAMUSCULAR | Status: DC | PRN
  Administered 2012-10-17: 2 mg via INTRAVENOUS
  Administered 2012-10-17 (×2): 4 mg via INTRAVENOUS
  Administered 2012-10-17 – 2012-10-18 (×5): 2 mg via INTRAVENOUS
  Filled 2012-10-17: qty 1
  Filled 2012-10-17 (×2): qty 2
  Filled 2012-10-17 (×4): qty 1

## 2012-10-17 MED ORDER — MORPHINE SULFATE 2 MG/ML IJ SOLN
INTRAMUSCULAR | Status: AC
  Filled 2012-10-17: qty 1

## 2012-10-17 MED ORDER — VANCOMYCIN 50 MG/ML ORAL SOLUTION
125.0000 mg | Freq: Four times a day (QID) | ORAL | Status: DC
  Administered 2012-10-27 – 2012-10-30 (×15): 125 mg via ORAL
  Filled 2012-10-17 (×16): qty 2.5

## 2012-10-17 NOTE — Progress Notes (Signed)
ELECTROPHYSIOLOGY ROUNDING NOTE    Patient Name: [name] Date of Encounter: 10/17/2012    SUBJECTIVE:Patient with moderate incisional pain.  No chest pain or shortness of breath. S/p pacemaker implant 10-16-2012.  CXR last night with no pneumo-- unable to visualize tip of RV lead.   TELEMETRY: Reviewed telemetry pt in atrial flutter with controlled ventricular response Filed Vitals:   10/16/12 2300 10/17/12 0000 10/17/12 0100 10/17/12 0200  BP: 99/72 108/67 106/66 109/71  Pulse: 88 89 81 92  Temp:      TempSrc:      Resp: 26 27 17 16   Height:      Weight:      SpO2: 99% 99% 100% 100%    Intake/Output Summary (Last 24 hours) at 10/17/12 0615 Last data filed at 10/17/12 0200  Gross per 24 hour  Intake   2030 ml  Output    855 ml  Net   1175 ml    CURRENT MEDICATIONS: . antiseptic oral rinse  15 mL Mouth Rinse q12n4p  . budesonide  0.25 mg Nebulization BID  . chlorhexidine  15 mL Mouth Rinse BID  . fidaxomicin  200 mg Oral BID  . gabapentin  100 mg Oral TID  . insulin aspart  0-15 Units Subcutaneous TID WC  . metoprolol tartrate  25 mg Oral BID  . rOPINIRole  1 mg Oral QHS  . saccharomyces boulardii  250 mg Oral BID  . sertraline  50 mg Oral Daily  . [START ON 10/27/2012] vancomycin  125 mg Oral QID   Followed by  . [START ON 11/03/2012] vancomycin  125 mg Oral BID   Followed by  . [START ON 11/10/2012] vancomycin  125 mg Oral Daily   Followed by  . [START ON 11/17/2012] vancomycin  125 mg Oral QODAY   Followed by  . [START ON 11/25/2012] vancomycin  125 mg Oral Q3 days  . vancomycin  500 mg Oral Q6H  . Warfarin - Pharmacist Dosing Inpatient   Does not apply q1800    LABS: Basic Metabolic Panel:  Recent Labs  16/10/96 0350 10/17/12 0330  NA 140 141  K 3.9 3.5  CL 103 103  CO2 28 32  GLUCOSE 164* 175*  BUN 6 6  CREATININE 0.59 0.53  CALCIUM 8.5 8.4  MG 1.6  --    Liver Function Tests:  Recent Labs  10/16/12 0350  AST 9  ALT 9  ALKPHOS 51    BILITOT 0.1*  PROT 4.7*  ALBUMIN 2.1*   CBC:  Recent Labs  10/16/12 0350 10/17/12 0330  WBC 7.0 6.4  HGB 9.1* 8.9*  HCT 30.1* 30.0*  MCV 89.3 90.9  PLT 521* 499*   Thyroid Function Tests:  Recent Labs  10/14/12 1245  TSH 2.447    Radiology/Studies:  Dg Chest Portable 1 View 10/16/2012   *RADIOLOGY REPORT*  Clinical Data: Post implant, rule out pneumothorax  PORTABLE CHEST - 1 VIEW  Comparison: 10/09/2012  Findings: Borderline cardiomegaly.  There is a dual lead cardiac pacemaker with left subclavian approach with leads in the right atrium and right ventricle.  Linear atelectasis or early infiltrate noted in the lingula.  There is small right pleural effusion with right basilar atelectasis or infiltrate.  There is a right IJ central line with tip in upper SVC.  No diagnostic pneumothorax.  IMPRESSION:   There is a dual lead cardiac pacemaker with left subclavian approach with leads in the right atrium and right ventricle. Linear  atelectasis or early infiltrate noted in the lingula.  There is small right pleural effusion with right basilar atelectasis or infiltrate.  There is a right IJ central line with tip in upper SVC.  No diagnostic pneumothorax.   Original Report Authenticated By: Natasha Mead, M.D.   PHYSICAL EXAM Left chest without hematoma or ecchymosis  DEVICE INTERROGATION: Device interrogation pending    Principal Problem:   Recurrent Clostridium difficile diarrhea Active Problems:   COPD (chronic obstructive pulmonary disease)   Obesity   Hyperlipidemia   Diabetes mellitus type 2 in obese   Hypertension   Acute renal failure   Atrial fibrillation   Atrial flutter   Cardiac arrest   Rec: At this point would add back AV nodal blocking drugs with oral beta blocker (lopressor 25-50 mg twice daily) and calcium channel blocker (verapamil or cardizem 180-360 mg daily) Wound care, arm restrictions reviewed with patient.  We will arrange for outpatient  followup.  Leonia Reeves.D.

## 2012-10-17 NOTE — Evaluation (Signed)
Physical Therapy Evaluation Patient Details Name: [name] MRN: [id number] DOB: [birth year] Today's Date: 10/17/2012 Time: 4098-1191 PT Time Calculation (min): 30 min  PT Assessment / Plan / Recommendation History of Present Illness  Pt admitted with recurrent CDiff with AMS, hypotension, AKI and shock s/p pacemaker 8/14  Clinical Impression  Pt educated for pacemaker restrictions and required encouragement to increase mobility. Pt with Afib with ventricular pacing with HR up to 155 with mobility, pt asymptomatic without pain or dizziness and RN present throughout with rate back to 125 seated. Pt maintained 88-95% on 4L with session. Pt with sling reapplied in sitting. Pt with decreased mobility and function who will benefit from acute therapy to maximize function, mobility and independence prior to discharge.     PT Assessment  Patient needs continued PT services    Follow Up Recommendations  CIR;Supervision/Assistance - 24 hour    Does the patient have the potential to tolerate intense rehabilitation      Barriers to Discharge        Equipment Recommendations  None recommended by PT    Recommendations for Other Services OT consult   Frequency Min 3X/week    Precautions / Restrictions Precautions Precautions: Fall Restrictions Weight Bearing Restrictions: No   Pertinent Vitals/Pain 5/10 sacral pain      Mobility  Bed Mobility Bed Mobility: Supine to Sit;Sitting - Scoot to Edge of Bed Supine to Sit: 3: Mod assist;HOB elevated Sitting - Scoot to [place] of Bed: 4: Min assist Details for Bed Mobility Assistance: left semisidely on arrival with assist to pivot to EOB and elevate trunk not pushing with LUE with cueing throughout. Min assist to scoot to EOB and mod assist to scoot back in chair with pad and reciprocal scooting Transfers Sit to Stand: 3: Mod assist;From bed Stand to Sit: 4: Min assist;To chair/3-in-1;With armrests (with Right armrest) Details for  Transfer Assistance: cueing for hand placement, precautions and assist for anterior translation Ambulation/Gait Ambulation/Gait Assistance: 3: Mod assist (+1 for lines and safety) Ambulation Distance (Feet): 1 Feet Assistive device: Rolling walker Ambulation/Gait Assistance Details: Pt initiated gait with one step but then stated she couldn't do it with HR increased to 155 and pt stepped back one step to chair with RN present throughout mobility Gait Pattern: Step-to pattern Stairs: No    Exercises     PT Diagnosis: Difficulty walking;Generalized weakness  PT Problem List: Decreased strength;Decreased activity tolerance;Decreased mobility;Pain;Decreased knowledge of use of DME;Decreased knowledge of precautions PT Treatment Interventions: Gait training;DME instruction;Functional mobility training;Therapeutic activities;Therapeutic exercise;Patient/family education     PT Goals(Current goals can be found in the care plan section) Acute Rehab PT Goals Patient Stated Goal: to go home and be stronger PT Goal Formulation: With patient Time For Goal Achievement: 10/31/12 Potential to Achieve Goals: Good  Visit Information  Last PT Received On: 10/17/12 Assistance Needed: +2 (lines and safety) History of Present Illness: Pt admitted with recurrent CDiff with AMS, hypotension, AKI and shock s/p pacemaker 8/14       Prior Functioning  Home Living Family/patient expects to be discharged to:: Private residence Living Arrangements: Spouse/significant other Available Help at Discharge: Family;Available 24 hours/day Type of Home: Mobile home Home Access: Stairs to enter Entrance Stairs-Number of Steps: 1 Home Layout: One level Home Equipment: Walker - 2 wheels;Bedside commode;Shower seat;Cane - single point Prior Function Level of Independence: Independent Comments: husband does all of the cooking and cleaning Communication Communication: No difficulties    Cognition   Cognition Arousal/Alertness:  Awake/alert Behavior During Therapy: WFL for tasks assessed/performed Overall Cognitive Status: Within Functional Limits for tasks assessed    Extremity/Trunk Assessment Upper Extremity Assessment Upper Extremity Assessment: Defer to OT evaluation Lower Extremity Assessment Lower Extremity Assessment: Generalized weakness Cervical / Trunk Assessment Cervical / Trunk Assessment: Normal   Balance Static Standing Balance Static Standing - Balance Support: No upper extremity supported Static Standing - Level of Assistance: 5: Stand by assistance Static Standing - Comment/# of Minutes: 4  End of Session PT - End of Session Equipment Utilized During Treatment: Oxygen;Gait belt Activity Tolerance: Patient limited by pain;Patient limited by fatigue Patient left: in chair;with call bell/phone within reach;with nursing/sitter in room Nurse Communication: Mobility status  GP     Toney Sang [facility] 10/17/2012, 9:27 AM Delaney Meigs, PT [phone]

## 2012-10-17 NOTE — Progress Notes (Signed)
[facility] Progress Note [place] TEAM 1 - Stepdown/ICU TEAM   [name] ZOX:[id number] DOB: 18-Jan-1956 DOA: 10/09/2012 PCP: [name], NP  Brief narrative: This is a 57 y/o female who presented on 8/7 with confusion, diarrhea, abd pain.   She was hospitalized last December with ARDS from influenza and probable secondary pneumococcal pneumonia. For the tail end of her admission (in Jan) she developed C. difficile colitis. It appears she was treated with oral metronidazole. She was transferred to [facility] for ventilator weaning and rehab. She received a full course of treatment but continued to have diarrhea after she was discharged home (2/14).   She returned to the hospital on 7/8 for diarrhea and hypotension- transferred to ICU on 7/9 for persisent hypotension and resp failure - intubated and placed on pressors. Found to have C diff colitis again. - discharged on 7/15 on PO Vanc.   She did not fill her medications after this discharge due to cost.  She was re-admitted on 7/28, treated again for C Diff and discharged on 8/2. This time had social services arrange outpt Vanc.  She was re-admitted on 8/7 for diarrhea and hypotension. However, this time,  she states she was taking her medications appropriately (Vanc taper and Flagyl). She complained that she had to wait a long time at the pharmacy to Pick up the Vanc but did obtain her meds and take them appropriately.    Assessment/Plan: Principal Problem:   Recurrent resistant Clostridium difficile diarrhea-  - management per ID- on Dificid now with plans for vanc taper - still having watery stool - has flexiseal - cont IV hydration  Active Problems:  Cardiac arrest/ Complete heart block - s/p pacer    Atrial fibrillation and A- flutter- new onset - apparently allergic to Cardizem per EPIC but pt states she has no allergy - have consulted Cardiology for further assistance - ECHO 1/3- EF 50-55%,  elevated  pulm pressure and RV dilation- atrial were normal in size -TSH pending - Warfarin without bridging started after pacer     COPD (chronic obstructive pulmonary disease) - on home O2 - stable    Obesity    Diabetes mellitus type 2  - Takes 32 U of Lantus which is on hold  - moderate dose sliding scale- sugars are stable at this low dose of insulin  Code Status: full  Family Communication: none Disposition Plan: follow in SDU-   Consultants: ID Cardio EP  Antibiotics: Flagyl (8/8>>8/12)  Vanc PO (8/8>>  Fidaxomicin (8/13>>    DVT prophylaxis: SCDS  HPI/Subjective:  Pt with soreness in abdomen and back- trying to eat clears.    Objective: Blood pressure 105/63, pulse 100, temperature 97.5 F (36.4 C), temperature source Oral, resp. rate 12, height 5\' 5"  (1.651 m), weight 116 kg (255 lb 11.7 oz), SpO2 96.00%.  Intake/Output Summary (Last 24 hours) at 10/17/12 1724 Last data filed at 10/17/12 1700  Gross per 24 hour  Intake   1900 ml  Output   1805 ml  Net     95 ml     Exam: General: AAO x 3, No acute respiratory distress Lungs: Clear to auscultation bilaterally without wheezes or crackles- on 4 L O2 Cardiovascular: Iregular rate and rhythm without murmur gallop or rub normal S1 and S2 Abdomen: Nontender, mildly distended, soft, bowel sounds positive, no rebound, no ascites, no appreciable mass Extremities: No significant cyanosis, clubbing- significant edema bilateral lower extremities  Data Reviewed: Basic Metabolic Panel:  Recent Labs Lab 10/11/12 0440  10/13/12 0500 10/14/12 0430 10/15/12 0500 10/16/12 0350 10/17/12 0330  NA 136  < > 141 140 139 140 141  K 4.2  < > 4.0 3.9 4.2 3.9 3.5  CL 103  < > 108 107 104 103 103  CO2 22  < > 24 25 23 28  32  GLUCOSE 168*  < > 150* 126* 136* 164* 175*  BUN 35*  < > 13 9 6 6 6   CREATININE 1.12*  < > 0.66 0.60 0.57 0.59 0.53  CALCIUM 8.2*  < > 8.2* 8.2* 8.7 8.5 8.4  MG 2.1  --  1.8  --   --  1.6  --   PHOS  3.7  --  3.1  --   --   --   --   < > = values in this interval not displayed. Liver Function Tests:  Recent Labs Lab 10/16/12 0350  AST 9  ALT 9  ALKPHOS 51  BILITOT 0.1*  PROT 4.7*  ALBUMIN 2.1*   No results found for this basename: LIPASE, AMYLASE,  in the last 168 hours No results found for this basename: AMMONIA,  in the last 168 hours CBC:  Recent Labs Lab 10/12/12 0400 10/13/12 0500 10/14/12 0430 10/16/12 0350 10/17/12 0330  WBC 6.9 9.2 8.0 7.0 6.4  HGB 9.0* 9.6* 9.2* 9.1* 8.9*  HCT 27.9* 30.2* 29.9* 30.1* 30.0*  MCV 85.1 87.0 88.5 89.3 90.9  PLT 482* 506* 486* 521* 499*   Cardiac Enzymes: No results found for this basename: CKTOTAL, CKMB, CKMBINDEX, TROPONINI,  in the last 168 hours BNP (last 3 results)  Recent Labs  03/23/12 0612 03/27/12 0627 09/12/12 0508  PROBNP 184.7* 100.1 25.8   CBG:  Recent Labs Lab 10/16/12 1235 10/16/12 1550 10/17/12 0726 10/17/12 1123 10/17/12 1650  GLUCAP 164* 160* 162* 157* 155*    Recent Results (from the past 240 hour(s))  CULTURE, BLOOD (ROUTINE X 2)     Status: None   Collection Time    10/09/12  4:00 PM      Result Value Range Status   Specimen Description BLOOD ARM RIGHT   Final   Special Requests BOTTLES DRAWN AEROBIC AND ANAEROBIC 10CC   Final   Culture  Setup Time     Final   Value: 10/09/2012 21:56     Performed at [facility]   Culture     Final   Value: NO GROWTH 5 DAYS     Performed at [facility]   Report Status 10/15/2012 FINAL   Final  CULTURE, BLOOD (ROUTINE X 2)     Status: None   Collection Time    10/09/12  4:20 PM      Result Value Range Status   Specimen Description BLOOD HAND LEFT   Final   Special Requests BOTTLES DRAWN AEROBIC ONLY [facility]   Final   Culture  Setup Time     Final   Value: 10/09/2012 21:56     Performed at [facility]   Culture     Final   Value: NO GROWTH 5 DAYS     Performed at [facility]   Report Status 10/15/2012 FINAL    Final  URINE CULTURE     Status: None   Collection Time    10/09/12 10:14 PM      Result Value Range Status   Specimen Description URINE, CATHETERIZED   Final   Special Requests NONE  Final   Culture  Setup Time     Final   Value: 10/09/2012 23:08     Performed at [facility] Count     Final   Value: NO GROWTH     Performed at [facility]   Culture     Final   Value: NO GROWTH     Performed at [facility]   Report Status 10/10/2012 FINAL   Final  CLOSTRIDIUM DIFFICILE BY PCR     Status: None   Collection Time    10/10/12  1:46 PM      Result Value Range Status   C difficile by pcr NEGATIVE  NEGATIVE Final     Studies:  Recent x-ray studies have been reviewed in detail by the Attending Physician  Scheduled Meds:  Scheduled Meds: . antiseptic oral rinse  15 mL Mouth Rinse q12n4p  . budesonide  0.25 mg Nebulization BID  . chlorhexidine  15 mL Mouth Rinse BID  . feeding supplement  1 Container Oral TID WC  . fidaxomicin  200 mg Oral BID  . gabapentin  100 mg Oral TID  . insulin aspart  0-15 Units Subcutaneous TID WC  . metoprolol tartrate  25 mg Oral BID  . oxyCODONE  5 mg Oral Q6H  . rOPINIRole  1 mg Oral QHS  . saccharomyces boulardii  250 mg Oral BID  . sertraline  50 mg Oral Daily  . [START ON 10/27/2012] vancomycin  125 mg Oral QID   Followed by  . [START ON 11/03/2012] vancomycin  125 mg Oral BID   Followed by  . [START ON 11/10/2012] vancomycin  125 mg Oral Daily   Followed by  . [START ON 11/17/2012] vancomycin  125 mg Oral QODAY   Followed by  . [START ON 11/25/2012] vancomycin  125 mg Oral Q3 days  . warfarin  2.5 mg Oral ONCE-1800  . Warfarin - Pharmacist Dosing Inpatient   Does not apply q1800   Continuous Infusions: . sodium chloride 75 mL/hr (10/16/12 1024)    Time spent on care of this patient: 35 min   Deepika Decatur, MD  [facility] Office  [phone] Pager - Text Page per Loretha Stapler as per  below:  On-Call/Text Page:      [email or web address]      password [email or web address]  If 7PM-7AM, please contact night-coverage [email or web address] Password [facility] 10/17/2012, 5:24 PM   LOS: 8 days

## 2012-10-17 NOTE — Progress Notes (Signed)
Rehab Admissions Coordinator Note:  Patient was screened by Brock Ra for appropriateness for an Inpatient Acute Rehab Consult.  At this time, we are recommending Skilled Nursing Facility.  Pt's insurance is unlikely to cover CIR for her current situation.  Melanee Spry S 10/17/2012, 10:51 AM  I can be reached at [phone].

## 2012-10-17 NOTE — Progress Notes (Addendum)
NUTRITION FOLLOW-UP  DOCUMENTATION CODES Per approved criteria  -Morbid Obesity   INTERVENTION: Continue Resource Breeze PO TID. Advance diet as tolerated per team's discretion. If anticipate prolonged clear liquid or NPO status, recommend initiation of nutrition support. RD to continue to follow nutrition care plan.  NUTRITION DIAGNOSIS: Inadequate oral intake related to altered gi function as evidenced by limited diet order.  Goal: Pt to meet >/= 90% of their estimated nutrition needs - unmet.  Monitor:  Diet advancement, PO intake, weight trend, labs  ASSESSMENT: Pt with hx of c.diff since 02/2012. Pt admitted with confusion, diarrhea, fevers, chills and abd pain. Pt found to be hypotensive with AKI.   Underwent permanent pacemaker insertion 8/14. Advanced to Clear Liquids on 8/14. Pt has been either NPO or with clear liquid diet order since admit (x 8 days.) Ordered for Resource Breeze PO TID by MD.  RN reports that pt has very limited intake, has barely touched her trays since diet has been advanced.  Height: Ht Readings from Last 1 Encounters:  10/10/12 5\' 5"  (1.651 m)    Weight: Wt Readings from Last 1 Encounters:  10/15/12 255 lb 11.7 oz (116 kg)    BMI:  Body mass index is 42.56 kg/(m^2). Obese Class III  Estimated Nutritional Needs: Kcal: 2000-2200  Protein: 100-115 grams  Fluid: > 2 L/day   Skin:  L chest incision  Diet Order: Clear Liquid  EDUCATION NEEDS: -No education needs identified at this time   Intake/Output Summary (Last 24 hours) at 10/17/12 1406 Last data filed at 10/17/12 1100  Gross per 24 hour  Intake   1675 ml  Output   1595 ml  Net     80 ml    Last BM: 100 ml of stool via flexiseal 8/14  Labs:   Recent Labs Lab 10/11/12 0440  10/13/12 0500  10/15/12 0500 10/16/12 0350 10/17/12 0330  NA 136  < > 141  < > 139 140 141  K 4.2  < > 4.0  < > 4.2 3.9 3.5  CL 103  < > 108  < > 104 103 103  CO2 22  < > 24  < > 23 28 32   BUN 35*  < > 13  < > 6 6 6   CREATININE 1.12*  < > 0.66  < > 0.57 0.59 0.53  CALCIUM 8.2*  < > 8.2*  < > 8.7 8.5 8.4  MG 2.1  --  1.8  --   --  1.6  --   PHOS 3.7  --  3.1  --   --   --   --   GLUCOSE 168*  < > 150*  < > 136* 164* 175*  < > = values in this interval not displayed.  CBG (last 3)   Recent Labs  10/16/12 1550 10/17/12 0726 10/17/12 1123  GLUCAP 160* 162* 157*   Scheduled Meds: . antiseptic oral rinse  15 mL Mouth Rinse q12n4p  . budesonide  0.25 mg Nebulization BID  . chlorhexidine  15 mL Mouth Rinse BID  . feeding supplement  1 Container Oral TID WC  . fidaxomicin  200 mg Oral BID  . gabapentin  100 mg Oral TID  . insulin aspart  0-15 Units Subcutaneous TID WC  . metoprolol tartrate  25 mg Oral BID  . oxyCODONE  5 mg Oral Q6H  . rOPINIRole  1 mg Oral QHS  . saccharomyces boulardii  250 mg Oral BID  . sertraline  50 mg Oral Daily  . [START ON 10/27/2012] vancomycin  125 mg Oral QID   Followed by  . [START ON 11/03/2012] vancomycin  125 mg Oral BID   Followed by  . [START ON 11/10/2012] vancomycin  125 mg Oral Daily   Followed by  . [START ON 11/17/2012] vancomycin  125 mg Oral QODAY   Followed by  . [START ON 11/25/2012] vancomycin  125 mg Oral Q3 days  . Warfarin - Pharmacist Dosing Inpatient   Does not apply q1800    Continuous Infusions: . sodium chloride 75 mL/hr (10/16/12 1024)   Jarold Motto MS, RD, LDN Pager: [phone] After-hours pager: [phone]

## 2012-10-17 NOTE — Progress Notes (Signed)
Patient's daughter called for an update, updated her on patient's status, informed patient that her daughter did call to check on her.

## 2012-10-17 NOTE — Progress Notes (Signed)
Pt refuses CPAP/BIPAP qHS.

## 2012-10-17 NOTE — Progress Notes (Signed)
ANTICOAGULATION CONSULT NOTE - Follow Up Consult  Pharmacy Consult for Coumadin Indication: atrial fibrillation  Allergies  Allergen Reactions  . Ambien [Zolpidem Tartrate] Other (See Comments)    Severe hallucinations and behavior changes reported by family  . Ciprofloxacin     PATIENT AT VERY HIGH RISK FOR C DIFFICILE COLITIS  . Cardizem [Diltiazem Hcl] Swelling  . Lunesta [Eszopiclone] Other (See Comments)    Nightmares & hallucinations  . Metformin And Related Swelling  . Protonix [Pantoprazole Sodium] Other (See Comments)    Unknown reaction-"Doctor told me to never take it"    Patient Measurements: Height: 5\' 5"  (165.1 cm) Weight: 255 lb 11.7 oz (116 kg) IBW/kg (Calculated) : 57  Vital Signs: Temp: 97.5 F (36.4 C) (08/15 1653) Temp src: Oral (08/15 1653) BP: 118/75 mmHg (08/15 1500) Pulse Rate: 109 (08/15 1500)  Labs:  Recent Labs  10/15/12 0500 10/15/12 2313 10/16/12 0350 10/16/12 1243 10/17/12 0330  HGB  --   --  9.1*  --  8.9*  HCT  --   --  30.1*  --  30.0*  PLT  --   --  521*  --  499*  LABPROT  --  13.5  --  14.6 19.8*  INR  --  1.05  --  1.16 1.74*  CREATININE 0.57  --  0.59  --  0.53    Estimated Creatinine Clearance: 97.5 ml/min (by C-G formula based on Cr of 0.53).  Assessment:  S/p pacer 8/14.  First Coumadin dose of 5 mg given 8/14 ~12:30am, and second dose of 5 mg given at usual 6pm dosing time.  INR increased rapidly to 1.74 today.  Off Lovenox post-pacer. Site noted without hematoma or ecchymosis.  Goal of Therapy:  INR 2-3 Monitor platelets by anticoagulation protocol: Yes   Plan:   Coumadin 2.5 mg today.  Daily PT/INR.  Dennie Fetters, [place] Pager: [phone] 10/17/2012,5:08 PM

## 2012-10-18 DIAGNOSIS — E872 Acidosis: Secondary | ICD-10-CM

## 2012-10-18 DIAGNOSIS — N179 Acute kidney failure, unspecified: Secondary | ICD-10-CM

## 2012-10-18 LAB — GLUCOSE, CAPILLARY: Glucose-Capillary: 153 mg/dL — ABNORMAL HIGH (ref 70–99)

## 2012-10-18 LAB — BASIC METABOLIC PANEL
Chloride: 104 mEq/L (ref 96–112)
GFR calc Af Amer: >90 mL/min (ref >90)
GFR calc non Af Amer: >90 mL/min (ref >90)
Potassium: 3.6 mEq/L (ref 3.5–5.1)
Sodium: 143 mEq/L (ref 135–145)

## 2012-10-18 LAB — CBC
HCT: 30 % — ABNORMAL LOW (ref 36.0–46.0)
MCHC: 30.3 g/dL (ref 30.0–36.0)
Platelets: 447 10*3/uL — ABNORMAL HIGH (ref 150–400)
RDW: 20.3 % — ABNORMAL HIGH (ref 11.5–15.5)
WBC: 4.6 10*3/uL (ref 4.0–10.5)

## 2012-10-18 LAB — PROTIME-INR
INR: 2.17 — ABNORMAL HIGH (ref 0.00–1.49)
Prothrombin Time: 23.5 seconds — ABNORMAL HIGH (ref 11.6–15.2)

## 2012-10-18 MED ORDER — METOPROLOL TARTRATE 50 MG PO TABS
50.0000 mg | ORAL_TABLET | Freq: Two times a day (BID) | ORAL | Status: DC
  Administered 2012-10-18 – 2012-10-19 (×2): 50 mg via ORAL
  Filled 2012-10-18 (×3): qty 1

## 2012-10-18 MED ORDER — METOPROLOL TARTRATE 1 MG/ML IV SOLN
5.0000 mg | Freq: Once | INTRAVENOUS | Status: AC
  Administered 2012-10-18: 5 mg via INTRAVENOUS

## 2012-10-18 MED ORDER — WARFARIN VIDEO
Freq: Once | Status: AC
  Administered 2012-10-18: 14:00:00

## 2012-10-18 MED ORDER — WARFARIN SODIUM 2.5 MG PO TABS
2.5000 mg | ORAL_TABLET | Freq: Once | ORAL | Status: AC
  Administered 2012-10-18: 2.5 mg via ORAL
  Filled 2012-10-18: qty 1

## 2012-10-18 MED ORDER — PATIENT'S GUIDE TO USING COUMADIN BOOK
Freq: Once | Status: AC
  Administered 2012-10-18: 14:00:00
  Filled 2012-10-18: qty 1

## 2012-10-18 MED ORDER — METOPROLOL TARTRATE 1 MG/ML IV SOLN
INTRAVENOUS | Status: AC
  Filled 2012-10-18: qty 5

## 2012-10-18 NOTE — Progress Notes (Signed)
RN called, Re: Afib with rate not well controlled (HR at 150 bpm), but pt asymptomatic and BP stable. Will increase Metoprolol to 50 mg BID first dose now with BP holding criteria. Pt is s/p DDD PM placement.  Haydee Salter, MD

## 2012-10-18 NOTE — Progress Notes (Signed)
Pt placed in chair this am with sky lift with moderate toleration. Sat up this am for 40 minutes and increase from 10 minutes yesterday.Was c/o pain in rectal area while sitting up will monitor.

## 2012-10-18 NOTE — Progress Notes (Signed)
ANTICOAGULATION CONSULT NOTE - Follow Up Consult  Pharmacy Consult for Coumadin Indication: atrial fibrillation  Allergies  Allergen Reactions  . Ambien [Zolpidem Tartrate] Other (See Comments)    Severe hallucinations and behavior changes reported by family  . Ciprofloxacin     PATIENT AT VERY HIGH RISK FOR C DIFFICILE COLITIS  . Cardizem [Diltiazem Hcl] Swelling  . Lunesta [Eszopiclone] Other (See Comments)    Nightmares & hallucinations  . Metformin And Related Swelling  . Protonix [Pantoprazole Sodium] Other (See Comments)    Unknown reaction-"Doctor told me to never take it"    Patient Measurements: Height: 5\' 5"  (165.1 cm) Weight: 274 lb (124.286 kg) IBW/kg (Calculated) : 57  Vital Signs: Temp: 97.9 F (36.6 C) (08/16 0808) Temp src: Oral (08/16 0808) BP: 123/73 mmHg (08/16 0630) Pulse Rate: 106 (08/16 0800)  Labs:  Recent Labs  10/16/12 0350 10/16/12 1243 10/17/12 0330 10/18/12 0500  HGB 9.1*  --  8.9* 9.1*  HCT 30.1*  --  30.0* 30.0*  PLT 521*  --  499* 447*  LABPROT  --  14.6 19.8* 23.5*  INR  --  1.16 1.74* 2.17*  CREATININE 0.59  --  0.53 0.49*    Estimated Creatinine Clearance: 101.5 ml/min (by C-G formula based on Cr of 0.49).  Assessment:  S/p pacer 8/14. Warfarin without bridging started after pacer for Atrial fibrillation and A- flutter- new onset. Site noted without hematoma or ecchymosis.  INR (2.17) therapeutic after 3 doses of coumadin.  CBC stable.   Goal of Therapy:  INR 2-3 Monitor platelets by anticoagulation protocol: Yes   Plan:   repeat Coumadin 2.5 mg today.  Daily PT/INR. Coumadin book and video to begin education Herby Abraham, Pharm.D. [phone] 10/18/2012 8:31 AM

## 2012-10-18 NOTE — Progress Notes (Addendum)
[facility] Progress Note [name] TEAM 1 - Stepdown/ICU TEAM   [name] ZOX:[id number] DOB: [birth year] DOA: 10/09/2012 PCP: [name], NP  Brief narrative: This is a 57 y/o female who presented on 8/7 with confusion, diarrhea, abd pain.   She was hospitalized last December with ARDS from influenza and probable secondary pneumococcal pneumonia. For the tail end of her admission (in Jan) she developed C. difficile colitis. It appears she was treated with oral metronidazole. She was transferred to [facility] for ventilator weaning and rehab. She received a full course of treatment but continued to have diarrhea after she was discharged home (2/14).   She returned to the hospital on 7/8 for diarrhea and hypotension- transferred to ICU on 7/9 for persisent hypotension and resp failure - intubated and placed on pressors. Found to have C diff colitis again. - discharged on 7/15 on PO Vanc.   She did not fill her medications after this discharge due to cost.  She was re-admitted on 7/28, treated again for C Diff and discharged on 8/2. This time had social services arrange outpt Vanc.  She was re-admitted on 8/7 for diarrhea and hypotension. However, this time,  she states she was taking her medications appropriately (Vanc taper and Flagyl). She complained that she had to wait a long time at the pharmacy to Pick up the Vanc but did obtain her meds and take them appropriately.   Pt admitted with fever chills, hypotension and EF to the ICU on 8/8. Stablized and moved to SDU on 8/12. After I had a discussion with ID in regards to resistant C-dif, pt was started on Dificid on 8/13. She She developed a-fib and a cardio consult was requested on 8/12. She was started on oral Amio that day. Went into 3rd degree HB and cardiac arrest on 8/13. 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    Assessment/Plan: Principal Problem:   Recurrent resistant  Clostridium difficile diarrhea-  - management per ID- on Dificid now with plans for vanc taper - still having watery stool - has flexiseal - cont IV hydration to make up for fluid losses - convert from clears to regular diet today  Active Problems:  Cardiac arrest/ Complete heart block - s/p pacer    Atrial fibrillation and A- flutter- new onset - apparently allergic to Cardizem per EPIC but pt states she has no allergy - have consulted Cardiology for further assistance - ECHO 1/3- EF 50-55%,  elevated pulm pressure and RV dilation- atrial were normal in size -TSH normal - Warfarin without bridging started after pacer- INR now therapeutic      COPD (chronic obstructive pulmonary disease) - on home O2- stable on 4 L here - stable   Morbid Obesity    Diabetes mellitus type 2  - Takes 32 U of Lantus which is on hold  - moderate dose sliding scale- sugars are stable at this low dose of insulin - starting regular diet today instead of diabetic due to poor PO intake  Chronic pain in back - taking Hydrocodone 7.5 at home about 6 times a day -on Oxycodone 5 mg every 6 hrs here - very resistant to move out of bed  Sleep apnea? -never had a formal study yet - refuses CPAP here  Code Status: full  Family Communication: none Disposition Plan: transferred to telemetry today  Consultants: ID Cardio EP  Antibiotics: Flagyl (8/8>>8/12)  Vanc PO (8/8>>  Fidaxomicin (8/13>>  DVT prophylaxis: SCDS- therapeutic on Coumadin  HPI/Subjective:  Pt's back pain better with changed oxy to routine. Ready for a regular diet today. C/o of trouble breathing again (daily complaint) but no distress and normal exam.    Objective: Blood pressure 123/76, pulse 106, temperature 98.2 F (36.8 C), temperature source Oral, resp. rate 20, height 5\' 5"  (1.651 m), weight 124.286 kg (274 lb), SpO2 96.00%.  Intake/Output Summary (Last 24 hours) at 10/18/12 1144 Last data filed at 10/18/12 1100   Gross per 24 hour  Intake   1800 ml  Output   1985 ml  Net   -185 ml     Exam: General: AAO x 3, No acute respiratory distress Lungs: Clear to auscultation bilaterally without wheezes or crackles- on 4 L O2 Cardiovascular: Iregular rate and rhythm without murmur gallop or rub normal S1 and S2 Abdomen: Nontender, mildly distended, soft, bowel sounds positive, no rebound, no ascites, no appreciable mass Extremities: No significant cyanosis, clubbing- significant edema bilateral lower extremities  Data Reviewed: Basic Metabolic Panel:  Recent Labs Lab 10/13/12 0500 10/14/12 0430 10/15/12 0500 10/16/12 0350 10/17/12 0330 10/18/12 0500  NA 141 140 139 140 141 143  K 4.0 3.9 4.2 3.9 3.5 3.6  CL 108 107 104 103 103 104  CO2 24 25 23 28  32 34*  GLUCOSE 150* 126* 136* 164* 175* 155*  BUN 13 9 6 6 6  5*  CREATININE 0.66 0.60 0.57 0.59 0.53 0.49*  CALCIUM 8.2* 8.2* 8.7 8.5 8.4 8.3*  MG 1.8  --   --  1.6  --   --   PHOS 3.1  --   --   --   --   --    Liver Function Tests:  Recent Labs Lab 10/16/12 0350  AST 9  ALT 9  ALKPHOS 51  BILITOT 0.1*  PROT 4.7*  ALBUMIN 2.1*   No results found for this basename: LIPASE, AMYLASE,  in the last 168 hours No results found for this basename: AMMONIA,  in the last 168 hours CBC:  Recent Labs Lab 10/13/12 0500 10/14/12 0430 10/16/12 0350 10/17/12 0330 10/18/12 0500  WBC 9.2 8.0 7.0 6.4 4.6  HGB 9.6* 9.2* 9.1* 8.9* 9.1*  HCT 30.2* 29.9* 30.1* 30.0* 30.0*  MCV 87.0 88.5 89.3 90.9 92.0  PLT 506* 486* 521* 499* 447*   Cardiac Enzymes: No results found for this basename: CKTOTAL, CKMB, CKMBINDEX, TROPONINI,  in the last 168 hours BNP (last 3 results)  Recent Labs  03/23/12 0612 03/27/12 0627 09/12/12 0508  PROBNP 184.7* 100.1 25.8   CBG:  Recent Labs Lab 10/17/12 0726 10/17/12 1123 10/17/12 1650 10/17/12 2130 10/18/12 0805  GLUCAP 162* 157* 155* 133* 153*    Recent Results (from the past 240 hour(s))  CULTURE,  BLOOD (ROUTINE X 2)     Status: None   Collection Time    10/09/12  4:00 PM      Result Value Range Status   Specimen Description BLOOD ARM RIGHT   Final   Special Requests BOTTLES DRAWN AEROBIC AND ANAEROBIC 10CC   Final   Culture  Setup Time     Final   Value: 10/09/2012 21:56     Performed at [facility]   Culture     Final   Value: NO GROWTH 5 DAYS     Performed at [facility]   Report Status 10/15/2012 FINAL   Final  CULTURE, BLOOD (ROUTINE X 2)  Status: None   Collection Time    10/09/12  4:20 PM      Result Value Range Status   Specimen Description BLOOD HAND LEFT   Final   Special Requests BOTTLES DRAWN AEROBIC ONLY [facility]   Final   Culture  Setup Time     Final   Value: 10/09/2012 21:56     Performed at [facility]   Culture     Final   Value: NO GROWTH 5 DAYS     Performed at [facility]   Report Status 10/15/2012 FINAL   Final  URINE CULTURE     Status: None   Collection Time    10/09/12 10:14 PM      Result Value Range Status   Specimen Description URINE, CATHETERIZED   Final   Special Requests NONE   Final   Culture  Setup Time     Final   Value: 10/09/2012 23:08     Performed at [facility] Count     Final   Value: NO GROWTH     Performed at [facility]   Culture     Final   Value: NO GROWTH     Performed at [facility]   Report Status 10/10/2012 FINAL   Final  CLOSTRIDIUM DIFFICILE BY PCR     Status: None   Collection Time    10/10/12  1:46 PM      Result Value Range Status   C difficile by pcr NEGATIVE  NEGATIVE Final     Studies:  Recent x-ray studies have been reviewed in detail by the Attending Physician  Scheduled Meds:  Scheduled Meds: . antiseptic oral rinse  15 mL Mouth Rinse q12n4p  . budesonide  0.25 mg Nebulization BID  . chlorhexidine  15 mL Mouth Rinse BID  . feeding supplement  1 Container Oral TID WC  . fidaxomicin  200 mg Oral BID  . gabapentin  100 mg  Oral TID  . insulin aspart  0-15 Units Subcutaneous TID WC  . metoprolol tartrate  25 mg Oral BID  . oxyCODONE  5 mg Oral Q6H  . patient's guide to using coumadin book   Does not apply Once  . rOPINIRole  1 mg Oral QHS  . saccharomyces boulardii  250 mg Oral BID  . sertraline  50 mg Oral Daily  . [START ON 10/27/2012] vancomycin  125 mg Oral QID   Followed by  . [START ON 11/03/2012] vancomycin  125 mg Oral BID   Followed by  . [START ON 11/10/2012] vancomycin  125 mg Oral Daily   Followed by  . [START ON 11/17/2012] vancomycin  125 mg Oral QODAY   Followed by  . [START ON 11/25/2012] vancomycin  125 mg Oral Q3 days  . warfarin  2.5 mg Oral ONCE-1800  . warfarin   Does not apply Once  . Warfarin - Pharmacist Dosing Inpatient   Does not apply q1800   Continuous Infusions: . sodium chloride 75 mL/hr at 10/18/12 0459    Time spent on care of this patient: 35 min   Jacobi Ryant, MD  [facility] Office  [phone] Pager - Text Page per Loretha Stapler as per below:  On-Call/Text Page:      [email or web address]      password [email or web address]  If 7PM-7AM, please contact night-coverage [email or web address] Password [email or web address] 10/18/2012, 11:44 AM   LOS: 9 days      Addendum: Pt went into rapid A-fib  after transfer to tele- have deferred to Cardiology- If she needs to move back to SDU, will transfer her.

## 2012-10-18 NOTE — Progress Notes (Addendum)
Nursing Note: Patient arrived to 2W14 from 2S14 in AFIB HR 158. Patient reports no pain, SOB and congested gurgling sounds. Spoke with Butler Denmark, MD and she asked that I call Willa Rough MD. Paged Dr. Myrtis Ser to inform him of Afib rate. Paged on call cardiology doctor and he modified order from 25 to 50 mg metoprolol BID.  Dr. Butler Denmark called back to check on the status of the patient and managed her AFIB with HR 150- 160's with 5 mg metoprolol IV stat. Administered ordered dose and rechecked BP and HR fifteen minutes later. HR now in the 120- 130's and BP 116/80. Will continue to monitor closely and administer the 50 mg metoprolol ordered by Dr. Verdie Mosher when pharmacy sends it. Lajuana Matte, RN

## 2012-10-19 DIAGNOSIS — J9612 Chronic respiratory failure with hypercapnia: Secondary | ICD-10-CM | POA: Diagnosis not present

## 2012-10-19 LAB — BLOOD GAS, ARTERIAL
Acid-Base Excess: 3 mmol/L — ABNORMAL HIGH (ref 0.0–2.0)
Bicarbonate: 31 mEq/L — ABNORMAL HIGH (ref 20.0–24.0)
O2 Saturation: 95.6 %
TCO2: 33.7 mmol/L (ref 0–100)
pCO2 arterial: 89.1 mmHg (ref 35.0–45.0)
pO2, Arterial: 102 mmHg — ABNORMAL HIGH (ref 80.0–100.0)

## 2012-10-19 LAB — BASIC METABOLIC PANEL
BUN: 11 mg/dL (ref 6–23)
CO2: 30 mEq/L (ref 19–32)
Chloride: 103 mEq/L (ref 96–112)
Creatinine, Ser: 0.66 mg/dL (ref 0.50–1.10)
GFR calc Af Amer: >90 mL/min (ref >90)
Potassium: 4 mEq/L (ref 3.5–5.1)

## 2012-10-19 LAB — CBC
Hemoglobin: 9.8 g/dL — ABNORMAL LOW (ref 12.0–15.0)
MCH: 27.5 pg (ref 26.0–34.0)
MCV: 93.8 fL (ref 78.0–100.0)
RBC: 3.57 MIL/uL — ABNORMAL LOW (ref 3.87–5.11)
WBC: 8.3 10*3/uL (ref 4.0–10.5)

## 2012-10-19 LAB — GLUCOSE, CAPILLARY: Glucose-Capillary: 169 mg/dL — ABNORMAL HIGH (ref 70–99)

## 2012-10-19 MED ORDER — LEVALBUTEROL HCL 0.63 MG/3ML IN NEBU
0.6300 mg | INHALATION_SOLUTION | RESPIRATORY_TRACT | Status: DC | PRN
  Administered 2012-10-21 – 2012-10-23 (×4): 0.63 mg via RESPIRATORY_TRACT
  Filled 2012-10-19 (×4): qty 3

## 2012-10-19 MED ORDER — KCL IN DEXTROSE-NACL 20-5-0.45 MEQ/L-%-% IV SOLN
INTRAVENOUS | Status: DC
  Administered 2012-10-19: 19:00:00 via INTRAVENOUS
  Filled 2012-10-19 (×2): qty 1000

## 2012-10-19 MED ORDER — METOPROLOL TARTRATE 1 MG/ML IV SOLN
5.0000 mg | Freq: Four times a day (QID) | INTRAVENOUS | Status: DC
  Administered 2012-10-20 (×2): 5 mg via INTRAVENOUS
  Filled 2012-10-19 (×5): qty 5

## 2012-10-19 MED ORDER — WARFARIN SODIUM 2 MG PO TABS
2.0000 mg | ORAL_TABLET | Freq: Once | ORAL | Status: AC
  Filled 2012-10-19: qty 1

## 2012-10-19 NOTE — Progress Notes (Signed)
PULMONARY  / CRITICAL CARE MEDICINE  Name: [name] MRN: [id number] DOB: May 09, 1955    ADMISSION DATE:  10/09/2012 CONSULTATION DATE:  10/09/2012  REFERRING MD :  Vivia Ewing MD PRIMARY SERVICE: PCCM  CHIEF COMPLAINT:  Diarrhea  BRIEF PATIENT DESCRIPTION: 57 y/o female with a history of recurrent C.diff returned to the [facility] ED on 8/7 with confusion, diarrhea, subjective fevers, chills, and abdominal pain.  Found to be hypotensive with AKI.  BP initially improved with IVF in ED, but then shock. Admitted to 2100.  Transferred out of ICU and to [facility] 8/14. Transferred back to ICU 8/17 with hypercarbic resp failure. Notably, had received multiple doses of opioid analgesics and had been refusing nocturnal CPAP for 2-3 days prior to transfer back to ICU  SIGNIFICANT EVENTS / STUDIES:    LINES / TUBES: 8/7 R IJ CVL >>   CULTURES: 8/7 blood >> 8/7 urine >>negative  CDiff 8/8>>> negative  ANTIBIOTICS: 8/7 flagyl >>> 8/11 8/7 enteral vanc >>>  fidaxomicin 8/13 >>  Cefazolin 8/14 > post procedure  SUBJECTIVE/OVERNIGHT:  Moved back to the ICU 8/13 after episode of A flutter and ventricular non-conduction on amiodarone. This was prolonged but the patient was asymptomatic. She underwent urgent pacer placement this am 8/14 with plans to control her flutter medically. Still with diarrhea > 275 out 8/13, 100 so far today  VITAL SIGNS: Temp:  [97.7 F (36.5 C)-97.8 F (36.6 C)] 97.7 F (36.5 C) (08/17 1437) Pulse Rate:  [73-151] 81 (08/17 1900) Resp:  [13-22] 19 (08/17 1900) BP: (91-121)/(70-82) 118/82 mmHg (08/17 1900) SpO2:  [91 %-97 %] 96 % (08/17 1900) FiO2 (%):  [28 %-35 %] 28 % (08/17 1631) Weight:  [124.195 kg (273 lb 12.8 oz)] 124.195 kg (273 lb 12.8 oz) (08/17 0500) HEMODYNAMICS:   VENTILATOR SETTINGS: Vent Mode:  [-] BIPAP FiO2 (%):  [28 %-35 %] 28 % Set Rate:  [15 bmp] 15 bmp PEEP:  [5 cmH20] 5 cmH20 INTAKE / OUTPUT: Intake/Output     08/17 0701 - 08/18 0700   P.O.     I.V. (mL/kg) 165 (1.3)   Other    Total Intake(mL/kg) 165 (1.3)   Urine (mL/kg/hr) 100 (0.1)   Stool    Total Output 100   Net +65         PHYSICAL EXAMINATION: Gen: Lethargic, able to F/C, NAD on NPPV HEENT: WNL PULM: clear anteriorly with diminished BS, no wheezes CV: RRR s M AB: obese, soft, NT, NABS Ext: symmetric pitting edema to thighs Neuro: RASS -1, no focal deficits  LABS:  CBC  Recent Labs Lab 10/17/12 0330 10/18/12 0500 10/19/12 1430  HGB 8.9* 9.1* 9.8*  HCT 30.0* 30.0* 33.5*  WBC 6.4 4.6 8.3  PLT 499* 447* 469*    Coag's Recent Labs     10/17/12  0330  10/18/12  0500  10/19/12  0645  INR  1.74*  2.17*  2.54*    BMET  Recent Labs Lab 10/13/12 0500  10/15/12 0500 10/16/12 0350 10/17/12 0330 10/18/12 0500 10/19/12 0645  NA 141  < > 139 140 141 143 143  K 4.0  < > 4.2 3.9 3.5 3.6 4.0  CL 108  < > 104 103 103 104 103  CO2 24  < > 23 28 32 34* 30  GLUCOSE 150*  < > 136* 164* 175* 155* 192*  BUN 13  < > 6 6 6  5* 11  CREATININE 0.66  < > 0.57 0.59 0.53 0.49* 0.66  CALCIUM 8.2*  < > 8.7 8.5 8.4 8.3* 8.9  MG 1.8  --   --  1.6  --   --   --   PHOS 3.1  --   --   --   --   --   --   < > = values in this interval not displayed.  Electrolytes Recent Labs     10/17/12  0330  10/18/12  0500  10/19/12  0645  CALCIUM  8.4  8.3*  8.9   Sepsis Markers No results found for this basename: LACTICACIDVEN, PROCALCITON, O2SATVEN,  in the last 72 hours  ABG  Recent Labs Lab 10/19/12 1409  PHART 7.167*  PCO2ART 89.1*  PO2ART 102.0*  HCO3 31.0*  TCO2 33.7  O2SAT 95.6   Liver Enzymes  Recent Labs Lab 10/15/12 2313 10/16/12 0350 10/16/12 1243 10/17/12 0330 10/18/12 0500 10/19/12 0645  AST  --  9  --   --   --   --   ALT  --  9  --   --   --   --   ALKPHOS  --  51  --   --   --   --   BILITOT  --  0.1*  --   --   --   --   PROT  --  4.7*  --   --   --   --   ALBUMIN  --  2.1*  --   --   --   --   INR 1.05  --  1.16 1.74* 2.17* 2.54*     Cardiac Enzymes No results found for this basename: TROPONINI, PROBNP,  in the last 72 hours Glucose Recent Labs     10/18/12  1131  10/18/12  1631  10/18/12  2107  10/19/12  0622  10/19/12  1120  10/19/12  1617  GLUCAP  136*  158*  178*  176*  182*  169*     ASSESSMENT / PLAN:   PULMONARY A: COPD OHS/OSA Acute on chronic hypercarbic res failure due to opiates and noncomplinace with noct CPAP P:   BiPAP until sufficiently responsive Cont BDs Need to avoid ALL opioids  Encourage compliance with NPPV  CARDIOVASCULAR A: Atrial fib/flutter, rate conotrolled prolonged pauses (in A flutter), s/p pacer 8/14 P:  Cont current Rx  RENAL A:  AKI, resolved  P: Monitor BMET intermittently Correct electrolytes as indicated   GASTROINTESTINAL A:  Hx Recurrent C.diff chronic colitis P:   Cont dificid and enteral vanc NPO until cognition improves  HEMATOLOGIC A:  Mild anemia  P:  -follow CBC  INFECTIOUS A:  Recurrent C.diff; CDiff neg this admit   P:   -cont Vanco + fidaxomicin  ENDOCRINE A:  DM2 P:   -SSI/monitor glucose  NEUROLOGIC A:  Acute enceph due to hypercarbia P:   -minimize sedating meds  -frequent orientation   Billy Fischer, MD ; [facility] service Mobile [phone].  After 5:30 PM or weekends, call [phone]

## 2012-10-19 NOTE — Progress Notes (Addendum)
I was called for rapid a-fib 8/16, onset after transfer out of SDU- I asked RN to contact Cardiology- Cardio on call increased PO metoprolol which was to be given 4 hrs later per RN- I checked back with RN and then asked RN to give Lopressor 5 mg IV stat as Pt was symptomatic (dyspnea). This brought HR down from 150s to 120s and improved her dyspnea- asked RN to f/u with PO Metoprolol in 30 min.   Calvert Cantor, MD

## 2012-10-19 NOTE — Progress Notes (Signed)
ANTICOAGULATION CONSULT NOTE - Follow Up Consult  Pharmacy Consult for Coumadin Indication: atrial fibrillation  Allergies  Allergen Reactions  . Ambien [Zolpidem Tartrate] Other (See Comments)    Severe hallucinations and behavior changes reported by family  . Ciprofloxacin     PATIENT AT VERY HIGH RISK FOR C DIFFICILE COLITIS  . Cardizem [Diltiazem Hcl] Swelling  . Lunesta [Eszopiclone] Other (See Comments)    Nightmares & hallucinations  . Metformin And Related Swelling  . Protonix [Pantoprazole Sodium] Other (See Comments)    Unknown reaction-"Doctor told me to never take it"    Patient Measurements: Height: 5\' 5"  (165.1 cm) Weight: 273 lb 12.8 oz (124.195 kg) IBW/kg (Calculated) : 57  Vital Signs: Temp: 97.7 F (36.5 C) (08/17 1437) Temp src: Rectal (08/17 1437) BP: 114/70 mmHg (08/17 1200) Pulse Rate: 98 (08/17 1437)  Labs:  Recent Labs  10/17/12 0330 10/18/12 0500 10/19/12 0645 10/19/12 1430  HGB 8.9* 9.1*  --  9.8*  HCT 30.0* 30.0*  --  33.5*  PLT 499* 447*  --  469*  LABPROT 19.8* 23.5* 26.5*  --   INR 1.74* 2.17* 2.54*  --   CREATININE 0.53 0.49* 0.66  --     Estimated Creatinine Clearance: 101.5 ml/min (by C-G formula based on Cr of 0.66).  Assessment:  S/p pacer 8/14. Coumadin begun 8/14 pre-pacer, then continued post-pacer without bridge. INR rose to therapeutic range after just 3 Coumadin doses. INR up 2.54 today. Pacer site noted without hematoma or ecchymosis.  Goal of Therapy:  INR 2-3 Monitor platelets by anticoagulation protocol: Yes   Plan:   Decrease today's Coumadin dose to 2 mg.  Daily PT/INR.  Dennie Fetters, [place] Pager: [phone] 10/19/2012,4:30 PM

## 2012-10-19 NOTE — Significant Event (Addendum)
Rapid Response Event Note  Overview:  Called to assess patient with decreased LOC and confusion Time Called: 1410 Arrival Time: 1420 Event Type: Neurologic  Initial Focused Assessment:  Arouses to name - will answer questions on occasion with lots of continuous stimulus - but falls back to sleep - cannot hold eyes open - resps shallow - bil BS present - little air movement - upper airway congested noted - very weak but does move - moans with movement - rectal temp 97.7 Manual BP 102/48 HR 84 irregular rhythm - 4+ pitting edema in hands and legs - abd soft - fleixseal patent - little drainage - green - little UOP today - 125cc amber -  RR 24 - O2 sats 97% 4 liter nasal cannula.  RN Norwood Levo states she is much more sleepy today vs yesterday - has been sleepy all day - only sedatives received today was OxyIR 5 mg this AM around 8 am.  RN reports increasing confusion today now with increased lethargy.  Skin cool and dry.   Interventions:  Stat ABG drawn.  Results to Dr. Butler Denmark - placed on BIPap - see RT note - 35% 15/5.  Oral care done prior to BiPap - fair cough - sounds wet - no sputum produced.  Orders for SDU - spoke with Dr. Butler Denmark and suggested ICU - probable high risk intubation - she agreed - ICU bed requested - Dr. Butler Denmark to contact CCM.  Lab work drawn thru left central line per IV team.  ABG results 7.16 Pco2 89 pO2 102 NaHCO3 31.  Repeat BP 105/62  HR 84 RR 20.  O2 sats 99%.  After one hour on BiPap patient opens eyes to name - still very sleepy - will follow few commands - RR 20-24 - BS remain distant - BP 103/67 HR 84-100.  Transferred to 2108 - Dr. Herma Carson camered in from EICU - Dr. Bard Herbert to beside.  Handoff to [employer].    Event Summary: Name of Physician Notified: Dr. Butler Denmark at  (pta RRT)  Name of Consulting Physician Notified: CCM at  (called by Dr.  Butler Denmark - 1445)  Outcome: Transferred (Comment)  2108     [place]

## 2012-10-19 NOTE — Progress Notes (Signed)
[facility] Progress Note [place] TEAM 1 - Stepdown/ICU TEAM   ZARYIA MARKEL WGN:[id number] DOB: 05-May-1955 DOA: 10/09/2012 PCP: [name], NP  Brief narrative: This is a 57 y/o female who presented on 8/7 with confusion, diarrhea, abd pain.   She was hospitalized last December with ARDS from influenza and probable secondary pneumococcal pneumonia. For the tail end of her admission (in Jan) she developed C. difficile colitis. It appears she was treated with oral metronidazole. She was transferred to [facility] for ventilator weaning and rehab. She received a full course of treatment but continued to have diarrhea after she was discharged home (2/14).   She returned to the hospital on 7/8 for diarrhea and hypotension- transferred to ICU on 7/9 for persisent hypotension and resp failure - intubated and placed on pressors. Found to have C diff colitis again. - discharged on 7/15 on PO Vanc.   She did not fill her medications after this discharge due to cost.  She was re-admitted on 7/28, treated again for C Diff and discharged on 8/2. This time had social services arrange outpt Vanc.  She was re-admitted on 8/7 for diarrhea and hypotension. However, this time,  she states she was taking her medications appropriately (Vanc taper and Flagyl). She complained that she had to wait a long time at the pharmacy to Pick up the Vanc but did obtain her meds and take them appropriately.   Pt admitted with fever chills, hypotension and EF to the ICU on 8/8. Stablized and moved to SDU on 8/12. After I had a discussion with ID in regards to resistant C-dif, pt was started on Dificid on 8/13. She She developed a-fib and a cardio consult was requested on 8/12. She was started on oral Amio that day. Went into 3rd degree HB and cardiac arrest on 8/13. 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    Assessment/Plan: Principal Problem: Acute resp failure on  chronic resp failure (COPD)- along with obesity - BiPAP- transfer to ICU - does not have official diagnosis of OSA but we have suspected it  -she has been asked to wear CPAP multiple times but has resisted     Recurrent resistant Clostridium difficile diarrhea-  - management per ID- on Dificid now with plans 10 days total and vanc taper afterwards - still having watery stool - has flexiseal - cont IV hydration to make up for fluid losses - convert from clears to regular diet yesterday  Cardiac arrest/ Complete heart block - s/p pacer    Atrial fibrillation and A- flutter- new onset - apparently allergic to Cardizem per EPIC but pt states she has no allergy - ECHO 1/3- EF 50-55%,  elevated pulm pressure and RV dilation- atrial were normal in size -TSH normal - Warfarin without bridging started after pacer- INR now therapeutic - managing with IV and PO Metoprolol due presumed allergy to Cardizem    Morbid Obesity    Diabetes mellitus type 2  - Takes 32 U of Lantus which is on hold  - moderate dose sliding scale- sugars are stable at this low dose of insulin - starting regular diet today instead of diabetic due to poor PO intake  Chronic pain in back - taking Hydrocodone 7.5 at home about 6 times a day -on Oxycodone 5 mg every 6 hrs here - very resistant to move out of bed  Sleep apnea? -never had a formal study yet -  refuses CPAP here  Code Status: full  Family Communication: none Disposition Plan: transfer to ICU today  Consultants: ID Cardio EP  Antibiotics: Flagyl (8/8>>8/12)  Vanc PO (8/8>>  Fidaxomicin (8/13>>  DVT prophylaxis: SCDS- therapeutic on Coumadin  HPI/Subjective:  Pt very lethargic this AM - attempted CPAP earlier this AM but removed- obtained ABG revealing hypercarbic resp failure   Objective: Blood pressure 114/70, pulse 98, temperature 97.7 F (36.5 C), temperature source Rectal, resp. rate 18, height 5\' 5"  (1.651 m), weight 124.195 kg (273  lb 12.8 oz), SpO2 97.00%.  Intake/Output Summary (Last 24 hours) at 10/19/12 1506 Last data filed at 10/19/12 0516  Gross per 24 hour  Intake   1275 ml  Output    850 ml  Net    425 ml     Exam: General: sleepy Lungs: Clear to auscultation bilaterally without wheezes or crackles- on 4 L O2 Cardiovascular: Iregular rate and rhythm without murmur gallop or rub normal S1 and S2 Abdomen: Nontender, mildly distended, soft, bowel sounds positive, no rebound, no ascites, no appreciable mass Extremities: No significant cyanosis, clubbing - significant edema bilateral lower extremities  Data Reviewed: Basic Metabolic Panel:  Recent Labs Lab 10/13/12 0500  10/15/12 0500 10/16/12 0350 10/17/12 0330 10/18/12 0500 10/19/12 0645  NA 141  < > 139 140 141 143 143  K 4.0  < > 4.2 3.9 3.5 3.6 4.0  CL 108  < > 104 103 103 104 103  CO2 24  < > 23 28 32 34* 30  GLUCOSE 150*  < > 136* 164* 175* 155* 192*  BUN 13  < > 6 6 6  5* 11  CREATININE 0.66  < > 0.57 0.59 0.53 0.49* 0.66  CALCIUM 8.2*  < > 8.7 8.5 8.4 8.3* 8.9  MG 1.8  --   --  1.6  --   --   --   PHOS 3.1  --   --   --   --   --   --   < > = values in this interval not displayed. Liver Function Tests:  Recent Labs Lab 10/16/12 0350  AST 9  ALT 9  ALKPHOS 51  BILITOT 0.1*  PROT 4.7*  ALBUMIN 2.1*   No results found for this basename: LIPASE, AMYLASE,  in the last 168 hours No results found for this basename: AMMONIA,  in the last 168 hours CBC:  Recent Labs Lab 10/13/12 0500 10/14/12 0430 10/16/12 0350 10/17/12 0330 10/18/12 0500  WBC 9.2 8.0 7.0 6.4 4.6  HGB 9.6* 9.2* 9.1* 8.9* 9.1*  HCT 30.2* 29.9* 30.1* 30.0* 30.0*  MCV 87.0 88.5 89.3 90.9 92.0  PLT 506* 486* 521* 499* 447*   Cardiac Enzymes: No results found for this basename: CKTOTAL, CKMB, CKMBINDEX, TROPONINI,  in the last 168 hours BNP (last 3 results)  Recent Labs  03/23/12 0612 03/27/12 0627 09/12/12 0508  PROBNP 184.7* 100.1 25.8    CBG:  Recent Labs Lab 10/18/12 1131 10/18/12 1631 10/18/12 2107 10/19/12 0622 10/19/12 1120  GLUCAP 136* 158* 178* 176* 182*    Recent Results (from the past 240 hour(s))  CULTURE, BLOOD (ROUTINE X 2)     Status: None   Collection Time    10/09/12  4:00 PM      Result Value Range Status   Specimen Description BLOOD ARM RIGHT   Final   Special Requests BOTTLES DRAWN AEROBIC AND ANAEROBIC 10CC   Final   Culture  Setup Time  Final   Value: 10/09/2012 21:56     Performed at [facility]   Culture     Final   Value: NO GROWTH 5 DAYS     Performed at [facility]   Report Status 10/15/2012 FINAL   Final  CULTURE, BLOOD (ROUTINE X 2)     Status: None   Collection Time    10/09/12  4:20 PM      Result Value Range Status   Specimen Description BLOOD HAND LEFT   Final   Special Requests BOTTLES DRAWN AEROBIC ONLY [facility]   Final   Culture  Setup Time     Final   Value: 10/09/2012 21:56     Performed at [facility]   Culture     Final   Value: NO GROWTH 5 DAYS     Performed at [facility]   Report Status 10/15/2012 FINAL   Final  URINE CULTURE     Status: None   Collection Time    10/09/12 10:14 PM      Result Value Range Status   Specimen Description URINE, CATHETERIZED   Final   Special Requests NONE   Final   Culture  Setup Time     Final   Value: 10/09/2012 23:08     Performed at [facility] Count     Final   Value: NO GROWTH     Performed at [facility]   Culture     Final   Value: NO GROWTH     Performed at [facility]   Report Status 10/10/2012 FINAL   Final  CLOSTRIDIUM DIFFICILE BY PCR     Status: None   Collection Time    10/10/12  1:46 PM      Result Value Range Status   C difficile by pcr NEGATIVE  NEGATIVE Final     Studies:  Recent x-ray studies have been reviewed in detail by the Attending Physician  Scheduled Meds:  Scheduled Meds: . antiseptic oral rinse  15 mL Mouth  Rinse q12n4p  . budesonide  0.25 mg Nebulization BID  . chlorhexidine  15 mL Mouth Rinse BID  . feeding supplement  1 Container Oral TID WC  . fidaxomicin  200 mg Oral BID  . gabapentin  100 mg Oral TID  . insulin aspart  0-15 Units Subcutaneous TID WC  . metoprolol tartrate  50 mg Oral BID  . oxyCODONE  5 mg Oral Q6H  . rOPINIRole  1 mg Oral QHS  . saccharomyces boulardii  250 mg Oral BID  . sertraline  50 mg Oral Daily  . [START ON 10/27/2012] vancomycin  125 mg Oral QID   Followed by  . [START ON 11/03/2012] vancomycin  125 mg Oral BID   Followed by  . [START ON 11/10/2012] vancomycin  125 mg Oral Daily   Followed by  . [START ON 11/17/2012] vancomycin  125 mg Oral QODAY   Followed by  . [START ON 11/25/2012] vancomycin  125 mg Oral Q3 days  . Warfarin - Pharmacist Dosing Inpatient   Does not apply q1800   Continuous Infusions: . sodium chloride 75 mL/hr (10/19/12 0848)    Time spent on care of this patient: 35 min   Brigg Cape, MD  [facility] Office  [phone] Pager - Text Page per Loretha Stapler as per below:  On-Call/Text Page:      [email or web address]      password [email or web address]  If  7PM-7AM, please contact night-coverage [email or web address] Password [email or web address] 10/19/2012, 3:06 PM   LOS: 10 days

## 2012-10-20 ENCOUNTER — Inpatient Hospital Stay (HOSPITAL_COMMUNITY): Payer: Medicare HMO

## 2012-10-20 DIAGNOSIS — J9610 Chronic respiratory failure, unspecified whether with hypoxia or hypercapnia: Secondary | ICD-10-CM

## 2012-10-20 DIAGNOSIS — R609 Edema, unspecified: Secondary | ICD-10-CM

## 2012-10-20 DIAGNOSIS — R601 Generalized edema: Secondary | ICD-10-CM | POA: Diagnosis not present

## 2012-10-20 LAB — CBC
HCT: 32.6 % — ABNORMAL LOW (ref 36.0–46.0)
Hemoglobin: 9.8 g/dL — ABNORMAL LOW (ref 12.0–15.0)
MCHC: 30.1 g/dL (ref 30.0–36.0)
RBC: 3.54 MIL/uL — ABNORMAL LOW (ref 3.87–5.11)

## 2012-10-20 LAB — BASIC METABOLIC PANEL
BUN: 14 mg/dL (ref 6–23)
Chloride: 103 mEq/L (ref 96–112)
GFR calc Af Amer: >90 mL/min (ref >90)
GFR calc non Af Amer: >90 mL/min (ref >90)
Glucose, Bld: 221 mg/dL — ABNORMAL HIGH (ref 70–99)
Potassium: 4.2 mEq/L (ref 3.5–5.1)
Sodium: 143 mEq/L (ref 135–145)

## 2012-10-20 LAB — GLUCOSE, CAPILLARY: Glucose-Capillary: 185 mg/dL — ABNORMAL HIGH (ref 70–99)

## 2012-10-20 MED ORDER — TRAMADOL HCL 50 MG PO TABS
100.0000 mg | ORAL_TABLET | Freq: Four times a day (QID) | ORAL | Status: DC | PRN
  Administered 2012-10-20 – 2012-10-30 (×28): 100 mg via ORAL
  Filled 2012-10-20 (×10): qty 2
  Filled 2012-10-20: qty 1
  Filled 2012-10-20 (×10): qty 2
  Filled 2012-10-20: qty 1
  Filled 2012-10-20 (×7): qty 2

## 2012-10-20 MED ORDER — DILTIAZEM HCL 100 MG IV SOLR
5.0000 mg/h | INTRAVENOUS | Status: AC
  Administered 2012-10-20: 10 mg/h via INTRAVENOUS
  Administered 2012-10-20: 5 mg/h via INTRAVENOUS
  Administered 2012-10-21: 15 mg/h via INTRAVENOUS
  Administered 2012-10-21: 10 mg/h via INTRAVENOUS
  Filled 2012-10-20 (×4): qty 100

## 2012-10-20 MED ORDER — POTASSIUM CHLORIDE 2 MEQ/ML IV SOLN
INTRAVENOUS | Status: DC
  Administered 2012-10-20 – 2012-10-22 (×5): via INTRAVENOUS
  Filled 2012-10-20 (×9): qty 1000

## 2012-10-20 MED ORDER — METOPROLOL TARTRATE 1 MG/ML IV SOLN
2.5000 mg | INTRAVENOUS | Status: DC | PRN
Start: 2012-10-20 — End: 2012-10-30
  Administered 2012-10-20 – 2012-10-23 (×5): 5 mg via INTRAVENOUS
  Filled 2012-10-20 (×6): qty 5

## 2012-10-20 MED ORDER — FUROSEMIDE 10 MG/ML IJ SOLN
INTRAMUSCULAR | Status: AC
  Administered 2012-10-20: 40 mg
  Filled 2012-10-20: qty 4

## 2012-10-20 MED ORDER — INSULIN ASPART 100 UNIT/ML ~~LOC~~ SOLN
0.0000 [IU] | SUBCUTANEOUS | Status: DC
  Administered 2012-10-20: 5 [IU] via SUBCUTANEOUS
  Administered 2012-10-20: 3 [IU] via SUBCUTANEOUS
  Administered 2012-10-20: 5 [IU] via SUBCUTANEOUS
  Administered 2012-10-21 (×2): 2 [IU] via SUBCUTANEOUS

## 2012-10-20 MED ORDER — DILTIAZEM HCL 25 MG/5ML IV SOLN
10.0000 mg | Freq: Once | INTRAVENOUS | Status: AC
  Administered 2012-10-20: 10 mg via INTRAVENOUS
  Filled 2012-10-20: qty 5

## 2012-10-20 MED ORDER — FUROSEMIDE 10 MG/ML IJ SOLN
40.0000 mg | Freq: Four times a day (QID) | INTRAMUSCULAR | Status: AC
  Administered 2012-10-20 (×2): 40 mg via INTRAVENOUS
  Filled 2012-10-20: qty 4

## 2012-10-20 MED ORDER — WARFARIN SODIUM 1 MG PO TABS
1.0000 mg | ORAL_TABLET | Freq: Once | ORAL | Status: AC
  Administered 2012-10-20: 1 mg via ORAL
  Filled 2012-10-20: qty 1

## 2012-10-20 MED ORDER — MORPHINE SULFATE 2 MG/ML IJ SOLN
INTRAMUSCULAR | Status: AC
  Filled 2012-10-20: qty 1

## 2012-10-20 MED ORDER — MORPHINE SULFATE 2 MG/ML IJ SOLN
1.0000 mg | Freq: Once | INTRAMUSCULAR | Status: AC
  Administered 2012-10-20: 1 mg via INTRAVENOUS

## 2012-10-20 MED ORDER — ACETAMINOPHEN 325 MG PO TABS
650.0000 mg | ORAL_TABLET | ORAL | Status: DC | PRN
  Administered 2012-10-20 – 2012-10-30 (×5): 650 mg via ORAL
  Filled 2012-10-20 (×5): qty 2

## 2012-10-20 NOTE — Progress Notes (Signed)
Physical Therapy Treatment Patient Details Name: [name] MRN: [id number] DOB: 10-08-1955 Today's Date: 10/20/2012 Time: 3086-5784 PT Time Calculation (min): 25 min  PT Assessment / Plan / Recommendation  History of Present Illness Pt admitted with recurrent CDiff with AMS, hypotension, AKI and shock s/p pacemaker 8/14   PT Comments   Pt on Bipap with limited activity tolerance today due to respiratory compromise. Pt required encouragement to participate and assist for HEp. Pt encouraged to continue bil LE HEP. Positioned in chair position with pillow under right hip for pressure relief. Will follow   Follow Up Recommendations        Does the patient have the potential to tolerate intense rehabilitation     Barriers to Discharge        Equipment Recommendations       Recommendations for Other Services    Frequency     Progress towards PT Goals Progress towards PT goals: Not progressing toward goals - comment (due to medical complications)  Plan Current plan remains appropriate    Precautions / Restrictions Precautions Precautions: Fall Precaution Comments: contact   Pertinent Vitals/Pain HR 118-158 brief spike with standing generally 118-135 with session sats 98% on bipap Sore buttock     Mobility  Bed Mobility Bed Mobility: Supine to Sit;Sitting - Scoot to [place] of Bed;Sit to Supine Supine to Sit: HOB elevated;1: +2 Total assist Supine to Sit: Patient Percentage: 60% Sitting - Scoot to Edge of Bed: 3: Mod assist Sit to Supine: 1: +2 Total assist Sit to Supine: Patient Percentage: 50% Details for Bed Mobility Assistance: cueing for sequence, not to push with LUE, HOb grossly 30% and assist to pivot with pad and scoot to EOB. Assist for bil LE back to surface on return to bed Transfers Transfers: Sit to Stand;Stand to Sit Sit to Stand: 1: +2 Total assist;From bed Sit to Stand: Patient Percentage: 60% Stand to Sit: To bed;1: +2 Total assist Stand to Sit:  Patient Percentage: 60% Details for Transfer Assistance: cueing for hand placement and assist to extend trunk and hips as well as anterior translation with 2 trials grossly 15 sec each with assist to position feet and step toward [facility] Ambulation/Gait Ambulation/Gait Assistance: Not tested (comment)    Exercises General Exercises - Lower Extremity Short Arc Quad: AROM;Both;20 reps;Supine Heel Slides: AAROM;Both;10 reps;Supine   PT Diagnosis:    PT Problem List:   PT Treatment Interventions:     PT Goals (current goals can now be found in the care plan section)    Visit Information  Last PT Received On: 10/20/12 Assistance Needed: +2 History of Present Illness: Pt admitted with recurrent CDiff with AMS, hypotension, AKI and shock s/p pacemaker 8/14    Subjective Data      Cognition  Cognition Arousal/Alertness: Awake/alert Behavior During Therapy: WFL for tasks assessed/performed Overall Cognitive Status: Impaired/Different from baseline Area of Impairment: Orientation Orientation Level: Time    Pension scheme manager Standing - Balance Support: Right upper extremity supported Static Standing - Level of Assistance: 5: Stand by assistance Static Standing - Comment/# of Minutes: 3  End of Session PT - End of Session Equipment Utilized During Treatment: Oxygen Activity Tolerance: Patient limited by fatigue Patient left: in bed;with call bell/phone within reach Nurse Communication: Mobility status   GP     Delorse Lek 10/20/2012, 8:54 AM Delaney Meigs, PT [phone]

## 2012-10-20 NOTE — Progress Notes (Signed)
ANTICOAGULATION CONSULT NOTE - Follow Up Consult  Pharmacy Consult for Coumadin Indication: atrial fibrillation  Allergies  Allergen Reactions  . Ambien [Zolpidem Tartrate] Other (See Comments)    Severe hallucinations and behavior changes reported by family  . Ciprofloxacin     PATIENT AT VERY HIGH RISK FOR C DIFFICILE COLITIS  . Lunesta [Eszopiclone] Other (See Comments)    Nightmares & hallucinations  . Metformin And Related Swelling  . Protonix [Pantoprazole Sodium] Other (See Comments)    Unknown reaction-"Doctor told me to never take it"    Patient Measurements: Height: 5\' 5"  (165.1 cm) Weight: 274 lb 0.5 oz (124.3 kg) IBW/kg (Calculated) : 57   Vital Signs: Temp: 98 F (36.7 C) (08/18 1142) Temp src: Oral (08/18 1142) BP: 126/97 mmHg (08/18 1200) Pulse Rate: 103 (08/18 1200)  Labs:  Recent Labs  10/18/12 0500 10/19/12 0645 10/19/12 1430 10/20/12 0423  HGB 9.1*  --  9.8* 9.8*  HCT 30.0*  --  33.5* 32.6*  PLT 447*  --  469* 496*  LABPROT 23.5* 26.5*  --  29.0*  INR 2.17* 2.54*  --  2.86*  CREATININE 0.49* 0.66  --  0.64    Estimated Creatinine Clearance: 101.5 ml/min (by C-G formula based on Cr of 0.64).   Assessment: Patient has new-onset atrial fibrillation/flutter  (CHADS2 2) and received a pacemaker on 8/14. Coumadin was started on 8/14 pre-pacer. The 2.5mg  dose planned for yesterday (8/17) was not given by nurse due to patient's NPO status, yet INR rose from 2.54 to 2.86 today. CBC stable; no bleeding noted.  Goal of Therapy:  INR 2-3 Monitor platelets by anticoagulation protocol: Yes   Plan:  Coumadin 1 mg today Daily PT/INR   Letvak,Kira 10/20/2012,12:38 PM  I agree with above. Christoper Fabian, PharmD, BCPS Clinical pharmacist, pager [phone] 10/20/2012    1:12 PM

## 2012-10-20 NOTE — Progress Notes (Signed)
PULMONARY  / CRITICAL CARE MEDICINE  Name: [name] MRN: [id number] DOB: 31-Jul-1955    ADMISSION DATE:  10/09/2012 CONSULTATION DATE:  10/09/2012  REFERRING MD :  Vivia Ewing MD PRIMARY SERVICE: PCCM  CHIEF COMPLAINT:  Diarrhea  BRIEF PATIENT DESCRIPTION: 57 y/o female with a history of recurrent C.diff returned to the [facility] ED on 8/7 with confusion, diarrhea, subjective fevers, chills, and abdominal pain.  Found to be hypotensive with AKI.  BP initially improved with IVF in ED, but then shock. Admitted to 2100.  Transferred out of ICU and to [facility] 8/14. Transferred back to ICU 8/17 with hypercarbic resp failure. Notably, had received multiple doses of opioid analgesics and had been refusing nocturnal CPAP for 2-3 days prior to transfer back to ICU    LINES / TUBES: 8/7 R IJ CVL >>   CULTURES: 8/7 blood >> 8/7 urine >>negative  CDiff 8/8>>> negative  ANTIBIOTICS: flagyl 8/7 >> 8/11  fidaxomicin 8/13 >>    SUBJECTIVE/OVERNIGHT:  Remains intermittently dependent on NPPV. LOC improved. + F/C. Intermittently agitated  VITAL SIGNS: Temp:  [97.4 F (36.3 C)-98.7 F (37.1 C)] 98 F (36.7 C) (08/18 1142) Pulse Rate:  [76-161] 103 (08/18 1200) Resp:  [13-31] 20 (08/18 1200) BP: (91-154)/(59-97) 126/97 mmHg (08/18 1200) SpO2:  [91 %-99 %] 97 % (08/18 1200) FiO2 (%):  [28 %-40 %] 40 % (08/18 0800) Weight:  [124.3 kg (274 lb 0.5 oz)] 124.3 kg (274 lb 0.5 oz) (08/18 0432) HEMODYNAMICS:   VENTILATOR SETTINGS: Vent Mode:  [-] BIPAP FiO2 (%):  [28 %-40 %] 40 % Set Rate:  [15 bmp] 15 bmp PEEP:  [5 cmH20] 5 cmH20 INTAKE / OUTPUT: Intake/Output     08/17 0701 - 08/18 0700 08/18 0701 - 08/19 0700   P.O.  120   I.V. (mL/kg) 765 (6.2) 198.5 (1.6)   Other     Total Intake(mL/kg) 765 (6.2) 318.5 (2.6)   Urine (mL/kg/hr) 615 (0.2) 2450 (3.3)   Stool     Total Output 615 2450   Net +150 -2131.5          PHYSICAL EXAMINATION: Gen: Obese, moaning @ times, mild-mod dyspnea HEENT:  WNL PULM: Markedly diminished BS, no wheezes CV: Tachy, irreg, no M noted AB: obese, soft, NT, NABS Ext: anasarca Neuro: no focal deficits  LABS:  CBC  Recent Labs Lab 10/18/12 0500 10/19/12 1430 10/20/12 0423  HGB 9.1* 9.8* 9.8*  HCT 30.0* 33.5* 32.6*  WBC 4.6 8.3 7.5  PLT 447* 469* 496*    Coag's Recent Labs     10/18/12  0500  10/19/12  0645  10/20/12  0423  INR  2.17*  2.54*  2.86*    BMET  Recent Labs Lab 10/16/12 0350 10/17/12 0330 10/18/12 0500 10/19/12 0645 10/20/12 0423  NA 140 141 143 143 143  K 3.9 3.5 3.6 4.0 4.2  CL 103 103 104 103 103  CO2 28 32 34* 30 32  GLUCOSE 164* 175* 155* 192* 221*  BUN 6 6 5* 11 14  CREATININE 0.59 0.53 0.49* 0.66 0.64  CALCIUM 8.5 8.4 8.3* 8.9 8.8  MG 1.6  --   --   --   --     Electrolytes Recent Labs     10/18/12  0500  10/19/12  0645  10/20/12  0423  CALCIUM  8.3*  8.9  8.8   Sepsis Markers No results found for this basename: LACTICACIDVEN, PROCALCITON, O2SATVEN,  in the last 72 hours  ABG  Recent Labs Lab 10/19/12 1409  PHART 7.167*  PCO2ART 89.1*  PO2ART 102.0*  HCO3 31.0*  TCO2 33.7  O2SAT 95.6   Liver Enzymes  Recent Labs Lab 10/16/12 0350 10/16/12 1243 10/17/12 0330 10/18/12 0500 10/19/12 0645 10/20/12 0423  AST 9  --   --   --   --   --   ALT 9  --   --   --   --   --   ALKPHOS 51  --   --   --   --   --   BILITOT 0.1*  --   --   --   --   --   PROT 4.7*  --   --   --   --   --   ALBUMIN 2.1*  --   --   --   --   --   INR  --  1.16 1.74* 2.17* 2.54* 2.86*    Cardiac Enzymes No results found for this basename: TROPONINI, PROBNP,  in the last 72 hours Glucose Recent Labs     10/19/12  0622  10/19/12  1120  10/19/12  1510  10/19/12  1617  10/20/12  0753  10/20/12  1136  GLUCAP  176*  182*  185*  169*  202*  214*    CXR: R effusion, CN, vasc congestion  ASSESSMENT / PLAN:   PULMONARY A: COPD OHS/OSA Acute on chronic hypercarbic resp failure due to opiates,  OHS P:   BiPAP PRN Cont BDs Need to avoid ALL opioids  If intubated, would proceed with early trach tube placement  CARDIOVASCULAR A: Atrial fib/flutter, rate poorly controlled prolonged pauses (in A flutter), s/p pacer 8/14 P:  Diltiazem infusion 8/18  RENAL A:  AKI, resolved  Anasarca, severe hypervolemia P: Monitor BMET intermittently Correct electrolytes as indicated Diuresis ordered 8/18  GASTROINTESTINAL A: Obesity chronic colitis P:   NPO until cognition improves  HEMATOLOGIC A:  Mild anemia  P:  -follow CBC  INFECTIOUS A:    Hx Recurrent C.diff P:   Cont dificid and enteral vanc as ordered  ENDOCRINE A:  DM2 P:   Cont SSI  NEUROLOGIC A:  Acute enceph due to hypercarbia Chronic pain syndrome Anxiety D/O P:   minimize sedating meds  Frequent orientation   35 mins CCM time  Billy Fischer, MD ; [facility] service Mobile [phone].  After 5:30 PM or weekends, call [phone]

## 2012-10-20 NOTE — Evaluation (Signed)
Occupational Therapy Evaluation Patient Details Name: KYLII ENNIS MRN: [id number] DOB: 1955-10-21 Today's Date: 10/20/2012 Time: 7829-5621 OT Time Calculation (min): 19 min  OT Assessment / Plan / Recommendation History of present illness This is a 57 y/o female who presented on 8/7 with confusion, diarrhea, abd pain. She was hospitalized last December with ARDS from influenza and probable secondary pneumococcal pneumonia. For the tail end of her admission (in Jan) she developed C. difficile colitis. It appears she was treated with oral metronidazole. She was transferred to [facility] for ventilator weaning and rehab. She received a full course of treatment but continued to have diarrhea after she was discharged home (2/14). She returned to the hospital on 7/8 for diarrhea and hypotension- transferred to ICU on 7/9 for persisent hypotension and resp failure - intubated and placed on pressors. Found to have C diff colitis again. Discharged on 7/15 on PO Vanc. She did not fill her medications after this discharge due to cost. She was re-admitted on 7/28, treated again for C Diff and discharged on 8/2. This time had social services arrange outpt Vanc. She was re-admitted on 8/7 for diarrhea and hypotension. However, this time, she states she was taking her medications appropriately (Vanc taper and Flagyl). She complained that she had to wait a long time at the pharmacy to Pick up the Vanc but did obtain her meds and take them appropriately. Pacemaker on 10/16/2012. Transferred back to ICU 8/17 with hypercarbic resp failure. Notably, had received multiple doses of opioid analgesics and had been refusing nocturnal CPAP for 2-3 days prior to transfer back to ICU   Clinical Impression   Pt admitted with above. Pt currently with functional limitations due to the deficits listed below (see OT Problem List). Pt will benefit from skilled OT to increase their safety and independence with ADL and functional mobility  for ADL to facilitate discharge to venue listed below.       OT Assessment  Patient needs continued OT Services    Follow Up Recommendations  SNF       Equipment Recommendations  None recommended by OT       Frequency  Min 2X/week    Precautions / Restrictions Precautions Precautions: Fall Precaution Comments: contact--brown Restrictions Weight Bearing Restrictions: No   Pertinent Vitals/Pain Pain from rectal tube    ADL  ADL Comments: At this time due to cognitive deficits, obesity, and edema pt is total A for all self care    OT Diagnosis: Generalized weakness;Cognitive deficits;Acute pain  OT Problem List: Decreased strength;Decreased range of motion;Decreased activity tolerance;Impaired balance (sitting and/or standing);Decreased coordination;Impaired UE functional use;Pain;Cardiopulmonary status limiting activity;Decreased knowledge of use of DME or AE;Increased edema OT Treatment Interventions: Self-care/ADL training;Balance training;Therapeutic activities;DME and/or AE instruction;Patient/family education;Cognitive remediation/compensation   OT Goals(Current goals can be found in the care plan section) Acute Rehab OT Goals OT Goal Formulation: Patient unable to participate in goal setting Time For Goal Achievement: 11/03/12 Potential to Achieve Goals: Good  Visit Information  Last OT Received On: 10/20/12 Assistance Needed: +2 History of Present Illness: This is a 57 y/o female who presented on 8/7 with confusion, diarrhea, abd pain. She was hospitalized last December with ARDS from influenza and probable secondary pneumococcal pneumonia. For the tail end of her admission (in Jan) she developed C. difficile colitis. It appears she was treated with oral metronidazole. She was transferred to [facility] for ventilator weaning and rehab. She received a full course of treatment but continued to have diarrhea after  she was discharged home (2/14). She returned to the hospital on  7/8 for diarrhea and hypotension- transferred to ICU on 7/9 for persisent hypotension and resp failure - intubated and placed on pressors. Found to have C diff colitis again. Discharged on 7/15 on PO Vanc. She did not fill her medications after this discharge due to cost. She was re-admitted on 7/28, treated again for C Diff and discharged on 8/2. This time had social services arrange outpt Vanc. She was re-admitted on 8/7 for diarrhea and hypotension. However, this time, she states she was taking her medications appropriately (Vanc taper and Flagyl). She complained that she had to wait a long time at the pharmacy to Pick up the Vanc but did obtain her meds and take them appropriately. Pacemaker on 10/16/2012. Transferred back to ICU 8/17 with hypercarbic resp failure. Notably, had received multiple doses of opioid analgesics and had been refusing nocturnal CPAP for 2-3 days prior to transfer back to ICU       Prior Functioning     Home Living Family/patient expects to be discharged to:: Private residence Living Arrangements: Spouse/significant other Available Help at Discharge: Family;Available 24 hours/day Type of Home: Mobile home Home Access: Stairs to enter Entrance Stairs-Number of Steps: 1 Entrance Stairs-Rails: Left Home Layout: One level Home Equipment: Walker - 2 wheels;Bedside commode;Shower seat;Cane - single point Prior Function Level of Independence: Independent Comments: husband does all of the cooking and cleaning Communication Communication: No difficulties Dominant Hand: Right         Vision/Perception Vision - History Baseline Vision:  (Pt unable to state)   Cognition  Cognition Arousal/Alertness: Awake/alert Behavior During Therapy: Flat affect Overall Cognitive Status: Impaired/Different from baseline Area of Impairment: Orientation;Following commands Orientation Level: Place Following Commands: Follows one step commands inconsistently    Extremity/Trunk  Assessment Upper Extremity Assessment Upper Extremity Assessment: RUE deficits/detail;LUE deficits/detail RUE Deficits / Details: edematous throughout; propped up better on pillows; minimal AROM and needs A for any movements RUE Coordination: decreased fine motor;decreased gross motor LUE Deficits / Details: edematous throughout; propped up better on pillows; minimal AROM and needs A for any movements     Mobility Bed Mobility Details for Bed Mobility Assistance: Did not attempt due to pt is a +2 and I did have +2 A.           End of Session OT - End of Session Activity Tolerance: Patient limited by fatigue;Patient limited by lethargy Patient left: in bed Nurse Communication:  (that I was going to try and prop her arms up more)       Evette Georges [phone] 10/20/2012, 4:46 PM

## 2012-10-21 ENCOUNTER — Inpatient Hospital Stay (HOSPITAL_COMMUNITY): Payer: Medicare HMO

## 2012-10-21 LAB — BASIC METABOLIC PANEL
BUN: 11 mg/dL (ref 6–23)
CO2: 37 mEq/L — ABNORMAL HIGH (ref 19–32)
Chloride: 102 mEq/L (ref 96–112)
GFR calc Af Amer: >90 mL/min (ref >90)
Glucose, Bld: 144 mg/dL — ABNORMAL HIGH (ref 70–99)
Potassium: 3.4 mEq/L — ABNORMAL LOW (ref 3.5–5.1)

## 2012-10-21 LAB — GLUCOSE, CAPILLARY
Glucose-Capillary: 125 mg/dL — ABNORMAL HIGH (ref 70–99)
Glucose-Capillary: 206 mg/dL — ABNORMAL HIGH (ref 70–99)
Glucose-Capillary: 94 mg/dL (ref 70–99)

## 2012-10-21 LAB — CBC
HCT: 32.5 % — ABNORMAL LOW (ref 36.0–46.0)
Hemoglobin: 9.8 g/dL — ABNORMAL LOW (ref 12.0–15.0)
MCHC: 30.2 g/dL (ref 30.0–36.0)
MCV: 93.1 fL (ref 78.0–100.0)

## 2012-10-21 LAB — PROTIME-INR: Prothrombin Time: 24.6 seconds — ABNORMAL HIGH (ref 11.6–15.2)

## 2012-10-21 MED ORDER — INSULIN ASPART 100 UNIT/ML ~~LOC~~ SOLN
0.0000 [IU] | Freq: Every day | SUBCUTANEOUS | Status: DC
  Administered 2012-10-21: 2 [IU] via SUBCUTANEOUS
  Administered 2012-10-23 – 2012-10-24 (×2): 3 [IU] via SUBCUTANEOUS

## 2012-10-21 MED ORDER — WARFARIN SODIUM 2 MG PO TABS
2.0000 mg | ORAL_TABLET | Freq: Once | ORAL | Status: AC
  Administered 2012-10-21: 2 mg via ORAL
  Filled 2012-10-21: qty 1

## 2012-10-21 MED ORDER — DILTIAZEM HCL 60 MG PO TABS
60.0000 mg | ORAL_TABLET | Freq: Four times a day (QID) | ORAL | Status: AC
  Administered 2012-10-21 – 2012-10-22 (×6): 60 mg via ORAL
  Filled 2012-10-21 (×8): qty 1

## 2012-10-21 MED ORDER — SPIRONOLACTONE 50 MG PO TABS
50.0000 mg | ORAL_TABLET | Freq: Two times a day (BID) | ORAL | Status: AC
  Administered 2012-10-21 (×2): 50 mg via ORAL
  Filled 2012-10-21 (×2): qty 1

## 2012-10-21 MED ORDER — FUROSEMIDE 10 MG/ML IJ SOLN
40.0000 mg | Freq: Once | INTRAMUSCULAR | Status: AC
  Administered 2012-10-21: 40 mg via INTRAVENOUS
  Filled 2012-10-21: qty 4

## 2012-10-21 MED ORDER — ACETAZOLAMIDE SODIUM 500 MG IJ SOLR
250.0000 mg | Freq: Once | INTRAMUSCULAR | Status: AC
  Administered 2012-10-21: 250 mg via INTRAVENOUS
  Filled 2012-10-21: qty 500

## 2012-10-21 MED ORDER — INSULIN ASPART 100 UNIT/ML ~~LOC~~ SOLN
3.0000 [IU] | Freq: Three times a day (TID) | SUBCUTANEOUS | Status: DC
  Administered 2012-10-21 – 2012-10-30 (×26): 3 [IU] via SUBCUTANEOUS

## 2012-10-21 MED ORDER — SODIUM CHLORIDE 0.9 % IJ SOLN
10.0000 mL | Freq: Two times a day (BID) | INTRAMUSCULAR | Status: DC
  Administered 2012-10-21 – 2012-10-29 (×14): 10 mL
  Filled 2012-10-21 (×2): qty 10

## 2012-10-21 MED ORDER — SODIUM CHLORIDE 0.9 % IJ SOLN
10.0000 mL | INTRAMUSCULAR | Status: DC | PRN
  Filled 2012-10-21: qty 20

## 2012-10-21 MED ORDER — INSULIN ASPART 100 UNIT/ML ~~LOC~~ SOLN
0.0000 [IU] | Freq: Three times a day (TID) | SUBCUTANEOUS | Status: DC
  Administered 2012-10-21 – 2012-10-22 (×5): 3 [IU] via SUBCUTANEOUS
  Administered 2012-10-23: 5 [IU] via SUBCUTANEOUS
  Administered 2012-10-23 – 2012-10-24 (×3): 3 [IU] via SUBCUTANEOUS
  Administered 2012-10-24: 8 [IU] via SUBCUTANEOUS
  Administered 2012-10-24 – 2012-10-25 (×2): 3 [IU] via SUBCUTANEOUS
  Administered 2012-10-25: 2 [IU] via SUBCUTANEOUS
  Administered 2012-10-25: 5 [IU] via SUBCUTANEOUS
  Administered 2012-10-26 (×2): 3 [IU] via SUBCUTANEOUS
  Administered 2012-10-26: 8 [IU] via SUBCUTANEOUS
  Administered 2012-10-27: 3 [IU] via SUBCUTANEOUS
  Administered 2012-10-27: 5 [IU] via SUBCUTANEOUS
  Administered 2012-10-27 – 2012-10-28 (×3): 3 [IU] via SUBCUTANEOUS
  Administered 2012-10-28: 2 [IU] via SUBCUTANEOUS
  Administered 2012-10-29: 5 [IU] via SUBCUTANEOUS
  Administered 2012-10-29 – 2012-10-30 (×3): 2 [IU] via SUBCUTANEOUS

## 2012-10-21 NOTE — Progress Notes (Signed)
Peripherally Inserted Central Catheter/Midline Placement  The IV Nurse has discussed with the patient and/or persons authorized to consent for the patient, the purpose of this procedure and the potential benefits and risks involved with this procedure.  The benefits include less needle sticks, lab draws from the catheter and patient may be discharged home with the catheter.  Risks include, but not limited to, infection, bleeding, blood clot (thrombus formation), and puncture of an artery; nerve damage and irregular heat beat.  Alternatives to this procedure were also discussed.  PICC/Midline Placement Documentation        [name] 10/21/2012, 12:39 PM

## 2012-10-21 NOTE — Progress Notes (Signed)
Physical Therapy Treatment Patient Details Name: [name] MRN: [id number] DOB: 1955-06-14 Today's Date: 10/21/2012 Time: 0757-0829 PT Time Calculation (min): 32 min  PT Assessment / Plan / Recommendation  History of Present Illness This is a 57 y/o female who presented on 8/7 with confusion, diarrhea, abd pain. She was hospitalized last December with ARDS from influenza and probable secondary pneumococcal pneumonia. For the tail end of her admission (in Jan) she developed C. difficile colitis. She was transferred to [facility] for ventilator weaning and rehab. She received a full course of treatment but continued to have diarrhea after she was discharged home (2/14). She returned to the hospital on 7/8 for diarrhea and hypotension- transferred to ICU on 7/9 for persisent hypotension and resp failure - intubated and placed on pressors. Found to have C diff colitis again. She was re-admitted on 7/28, treated again for C Diff and discharged on 8/2. This time had social services arrange outpt Vanc. She was re-admitted on 8/7 for diarrhea and hypotension. Pacemaker on 10/16/2012. Transferred back to ICU 8/17 with hypercarbic resp failure. Notably, had received multiple doses of opioid analgesics and had been refusing nocturnal CPAP for 2-3 days prior to transfer back to ICU   PT Comments   Pt with improvement from prior session and continues to benefit from encouragement to fully participate. Pt denied bil LE HEP today and required increased time with and between all transfers. RN notified of transfer used and pt limited time sitting up due to buttock pain.  Follow Up Recommendations        Does the patient have the potential to tolerate intense rehabilitation     Barriers to Discharge        Equipment Recommendations       Recommendations for Other Services    Frequency     Progress towards PT Goals Progress towards PT goals: Progressing toward goals  Plan Current plan remains appropriate     Precautions / Restrictions Precautions Precautions: Fall Precaution Comments: contact--brown, rectal tube   Pertinent Vitals/Pain Pt with 8/10 buttock pain HR 108-150, brief spike to 150 with supine to sit but decreased to 125 EOB 94% on 6L     Mobility  Bed Mobility Supine to Sit: 3: Mod assist;HOB elevated Sitting - Scoot to Edge of Bed: 4: Min assist Details for Bed Mobility Assistance: cueing for sequence and assist to prevent overuse of LUE with increased time and pad to scoot forward Transfers Transfers: Sit to Stand;Stand to Sit;Stand Pivot Transfers Sit to Stand: 1: +2 Total assist;From bed Sit to Stand: Patient Percentage: 80% Stand to Sit: 4: Min assist;With armrests (right armrest) Stand Pivot Transfers: 1: +2 Total assist Stand Pivot Transfers: Patient Percentage: 80% Details for Transfer Assistance: cueing for hand placement, safety and use of RW to pivot bed to chair Ambulation/Gait Ambulation/Gait Assistance: Not tested (comment)    Exercises     PT Diagnosis:    PT Problem List:   PT Treatment Interventions:     PT Goals (current goals can now be found in the care plan section)    Visit Information  Last PT Received On: 10/21/12 Assistance Needed: +2 (safety with transfers) History of Present Illness: This is a 57 y/o female who presented on 8/7 with confusion, diarrhea, abd pain. She was hospitalized last December with ARDS from influenza and probable secondary pneumococcal pneumonia. For the tail end of her admission (in Jan) she developed C. difficile colitis. She was transferred to [facility] for ventilator weaning  and rehab. She received a full course of treatment but continued to have diarrhea after she was discharged home (2/14). She returned to the hospital on 7/8 for diarrhea and hypotension- transferred to ICU on 7/9 for persisent hypotension and resp failure - intubated and placed on pressors. Found to have C diff colitis again. She was re-admitted on  7/28, treated again for C Diff and discharged on 8/2. This time had social services arrange outpt Vanc. She was re-admitted on 8/7 for diarrhea and hypotension. Pacemaker on 10/16/2012. Transferred back to ICU 8/17 with hypercarbic resp failure. Notably, had received multiple doses of opioid analgesics and had been refusing nocturnal CPAP for 2-3 days prior to transfer back to ICU    Subjective Data      Cognition  Cognition Arousal/Alertness: Awake/alert Behavior During Therapy: Flat affect Overall Cognitive Status: Impaired/Different from baseline Area of Impairment: Orientation Orientation Level: Time Following Commands: Follows one step commands consistently    Balance  Static Standing Balance Static Standing - Balance Support: No upper extremity supported Static Standing - Level of Assistance: 6: Modified independent (Device/Increase time) Static Standing - Comment/# of Minutes: 7  End of Session PT - End of Session Equipment Utilized During Treatment: Oxygen;Gait belt Activity Tolerance: Patient limited by fatigue Patient left: with call bell/phone within reach;in chair Nurse Communication: Mobility status   GP     Delorse Lek 10/21/2012, 10:06 AM Delaney Meigs, PT [phone]

## 2012-10-21 NOTE — Progress Notes (Signed)
I agree with the above assessment and plan. Also discussed with Nadara Mustard, PharmD.  Kehinde Totzke C. Shaleah Nissley, PharmD Clinical Pharmacist-Resident 10/21/2012 11:10 AM

## 2012-10-21 NOTE — Progress Notes (Signed)
ANTICOAGULATION CONSULT NOTE - Follow Up Consult  Pharmacy Consult for Coumadin Indication: Atrial fibrillation  Allergies  Allergen Reactions  . Ambien [Zolpidem Tartrate] Other (See Comments)    Severe hallucinations and behavior changes reported by family  . Ciprofloxacin     PATIENT AT VERY HIGH RISK FOR C DIFFICILE COLITIS  . Lunesta [Eszopiclone] Other (See Comments)    Nightmares & hallucinations  . Metformin And Related Swelling  . Protonix [Pantoprazole Sodium] Other (See Comments)    Unknown reaction-"Doctor told me to never take it"    Patient Measurements: Height: 5\' 5"  (165.1 cm) Weight: 274 lb 0.5 oz (124.3 kg) IBW/kg (Calculated) : 57  Vital Signs: Temp: 97 F (36.1 C) (08/19 0808) Temp src: Oral (08/19 0808) BP: 129/66 mmHg (08/19 0800) Pulse Rate: 108 (08/19 0832)  Labs:  Recent Labs  10/19/12 0645  10/19/12 1430 10/20/12 0423 10/21/12 0430  HGB  --   < > 9.8* 9.8* 9.8*  HCT  --   --  33.5* 32.6* 32.5*  PLT  --   --  469* 496* 488*  LABPROT 26.5*  --   --  29.0* 24.6*  INR 2.54*  --   --  2.86* 2.31*  CREATININE 0.66  --   --  0.64 0.52  < > = values in this interval not displayed.  Estimated Creatinine Clearance: 101.5 ml/min (by C-G formula based on Cr of 0.52).    Assessment: Patient has new-onset atrial fibrillation and received a pacemaker on 8/14. Coumadin was started on 8/14 pre-pacer. INR dropped to 2.31 today after Coumadin 1 mg yesterday (and a missed dose the day prior). CBC stable; no bleeding noted.   Goal of Therapy:  INR 2-3 Monitor platelets by anticoagulation protocol: Yes   Plan:  Coumadin 2 mg today Daily PT/INR  Tomie China, PharmD Candidate  10/21/2012,8:56 AM

## 2012-10-21 NOTE — Progress Notes (Signed)
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  Found to be hypotensive with AKI.  BP initially improved with IVF in ED, but then shock. Admitted to 2100.  Transferred out of ICU and to [facility] 8/14. Transferred back to ICU 8/17 with hypercarbic resp failure. Notably, had received multiple doses of opioid analgesics and had been refusing nocturnal CPAP for 2-3 days prior to transfer back to ICU    LINES / TUBES: R IJ CVL 8/7 >> 8/19 PICC 8/19 >>   CULTURES: 8/7 blood >> NEG 8/7 urine >> NEG CDiff 8/8 >> NEG  ANTIBIOTICS: flagyl 8/7 >> 8/11  fidaxomicin 8/13 >>    SUBJECTIVE/OVERNIGHT:  Cognition much improved. No respiratory distress  VITAL SIGNS: Temp:  [97 F (36.1 C)-98.6 F (37 C)] 98.6 F (37 C) (08/19 1115) Pulse Rate:  [30-159] 144 (08/19 1224) Resp:  [14-31] 26 (08/19 1224) BP: (105-136)/(60-108) 110/66 mmHg (08/19 1224) SpO2:  [90 %-100 %] 97 % (08/19 1334) FiO2 (%):  [40 %-50 %] 40 % (08/19 1334) HEMODYNAMICS:   VENTILATOR SETTINGS: Vent Mode:  [-] BIPAP FiO2 (%):  [40 %-50 %] 40 % Set Rate:  [15 bmp] 15 bmp INTAKE / OUTPUT: Intake/Output     08/18 0701 - 08/19 0700 08/19 0701 - 08/20 0700   P.O. 220    I.V. (mL/kg) 1348.8 (10.9) 362.5 (2.9)   Total Intake(mL/kg) 1568.8 (12.6) 362.5 (2.9)   Urine (mL/kg/hr) 7525 (2.5) 600 (0.7)   Stool 100 (0)    Total Output 7625 600   Net -6056.3 -237.5          PHYSICAL EXAMINATION: Gen: RASS 0. + F/C. No distress HEENT: WNL PULM: improved BS diminished in RLL CV: Tachy, irreg, no M noted AB: obese, soft, NT, NABS Ext: anasarca improved Neuro: no focal  deficits  LABS:  CBC  Recent Labs Lab 10/19/12 1430 10/20/12 0423 10/21/12 0430  HGB 9.8* 9.8* 9.8*  HCT 33.5* 32.6* 32.5*  WBC 8.3 7.5 7.6  PLT 469* 496* 488*    Coag's Recent Labs     10/19/12  0645  10/20/12  0423  10/21/12  0430  INR  2.54*  2.86*  2.31*    BMET  Recent Labs Lab 10/16/12 0350 10/17/12 0330 10/18/12 0500 10/19/12 0645 10/20/12 0423 10/21/12 0430  NA 140 141 143 143 143 146*  K 3.9 3.5 3.6 4.0 4.2 3.4*  CL 103 103 104 103 103 102  CO2 28 32 34* 30 32 37*  GLUCOSE 164* 175* 155* 192* 221* 144*  BUN 6 6 5* 11 14 11   CREATININE 0.59 0.53 0.49* 0.66 0.64 0.52  CALCIUM 8.5 8.4 8.3* 8.9 8.8 8.5  MG 1.6  --   --   --   --   --     Electrolytes Recent Labs     10/19/12  0645  10/20/12  0423  10/21/12  0430  CALCIUM  8.9  8.8  8.5   Sepsis Markers No results found for this basename: LACTICACIDVEN, PROCALCITON, O2SATVEN,  in the last 72 hours  ABG  Recent Labs Lab 10/19/12 1409  PHART 7.167*  PCO2ART 89.1*  PO2ART 102.0*  HCO3 31.0*  TCO2 33.7  O2SAT 95.6   Liver Enzymes  Recent Labs Lab 10/16/12 0350  10/17/12 0330 10/18/12 0500 10/19/12 0645 10/20/12 0423 10/21/12 0430  AST 9  --   --   --   --   --   --   ALT 9  --   --   --   --   --   --   ALKPHOS 51  --   --   --   --   --   --   BILITOT 0.1*  --   --   --   --   --   --   PROT 4.7*  --   --   --   --   --   --   ALBUMIN 2.1*  --   --   --   --   --   --   INR  --   < > 1.74* 2.17* 2.54* 2.86* 2.31*  < > = values in this interval not displayed.  Cardiac Enzymes No results found for this basename: TROPONINI, PROBNP,  in the last 72 hours Glucose Recent Labs     10/20/12  1614  10/20/12  1949  10/20/12  2341  10/21/12  0355  10/21/12  0756  10/21/12  1058  GLUCAP  217*  207*  94  125*  147*  190*    CXR: NSC R effusion, CN, vasc congestion  ASSESSMENT / PLAN:   PULMONARY A: COPD OHS/OSA Acute on chronic hypercarbic resp failure due to opiates,  OHS P:   Cont BiPAP PRN Cont PRN BDs Need to avoid ALL opioids!! 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 - placed because of prolonged pauses P:  Change diltiazem infusion to PO Cont warfarin  RENAL A:  AKI, resolved  Anasarca, severe hypervolemia - improving P: Monitor BMET intermittently Correct electrolytes as indicated Cont diuresis as permitted by BP and renal function  GASTROINTESTINAL A: Obesity chronic colitis P:   Begin diet 8/19  HEMATOLOGIC A:  Mild anemia, no evidence of acute blood loss P:  -follow CBC  INFECTIOUS A:    Hx Recurrent C.diff P:   Cont dificid and enteral vanc as ordered  ENDOCRINE A:  DM2 P:   Cont SSI - change to ACHS 8/19  NEUROLOGIC A:  Acute enceph due to hypercarbia - resolved Chronic pain syndrome Anxiety D/O P:   minimize sedating meds  Frequent orientation   Watch in ICU today. Likely back to [facility] AM 8/20  Billy Fischer, MD ; [facility] [phone].  After 5:30 PM or weekends, call [phone]

## 2012-10-22 ENCOUNTER — Inpatient Hospital Stay (HOSPITAL_COMMUNITY): Payer: Medicare HMO

## 2012-10-22 DIAGNOSIS — E876 Hypokalemia: Secondary | ICD-10-CM

## 2012-10-22 DIAGNOSIS — J90 Pleural effusion, not elsewhere classified: Secondary | ICD-10-CM

## 2012-10-22 LAB — GLUCOSE, CAPILLARY
Glucose-Capillary: 127 mg/dL — ABNORMAL HIGH (ref 70–99)
Glucose-Capillary: 158 mg/dL — ABNORMAL HIGH (ref 70–99)
Glucose-Capillary: 166 mg/dL — ABNORMAL HIGH (ref 70–99)
Glucose-Capillary: 169 mg/dL — ABNORMAL HIGH (ref 70–99)
Glucose-Capillary: 201 mg/dL — ABNORMAL HIGH (ref 70–99)
Glucose-Capillary: 237 mg/dL — ABNORMAL HIGH (ref 70–99)

## 2012-10-22 LAB — PROTIME-INR
INR: 2.44 — ABNORMAL HIGH (ref 0.00–1.49)
Prothrombin Time: 25.7 seconds — ABNORMAL HIGH (ref 11.6–15.2)

## 2012-10-22 LAB — CBC
HCT: 30.1 % — ABNORMAL LOW (ref 36.0–46.0)
Platelets: 413 10*3/uL — ABNORMAL HIGH (ref 150–400)
RBC: 3.24 MIL/uL — ABNORMAL LOW (ref 3.87–5.11)
RDW: 20.5 % — ABNORMAL HIGH (ref 11.5–15.5)
WBC: 8.5 10*3/uL (ref 4.0–10.5)

## 2012-10-22 LAB — BASIC METABOLIC PANEL
CO2: 43 mEq/L (ref 19–32)
Chloride: 98 mEq/L (ref 96–112)
Creatinine, Ser: 0.46 mg/dL — ABNORMAL LOW (ref 0.50–1.10)
GFR calc Af Amer: >90 mL/min (ref >90)
Sodium: 144 mEq/L (ref 135–145)

## 2012-10-22 MED ORDER — SPIRONOLACTONE 50 MG PO TABS
50.0000 mg | ORAL_TABLET | Freq: Two times a day (BID) | ORAL | Status: DC
  Administered 2012-10-23: 50 mg via ORAL
  Filled 2012-10-22 (×2): qty 1

## 2012-10-22 MED ORDER — WHITE PETROLATUM GEL
Status: AC
  Administered 2012-10-22: 0.2
  Filled 2012-10-22: qty 5

## 2012-10-22 MED ORDER — WARFARIN SODIUM 2 MG PO TABS
2.0000 mg | ORAL_TABLET | Freq: Once | ORAL | Status: AC
  Administered 2012-10-22: 2 mg via ORAL
  Filled 2012-10-22: qty 1

## 2012-10-22 MED ORDER — DILTIAZEM HCL ER 60 MG PO CP12
120.0000 mg | ORAL_CAPSULE | Freq: Two times a day (BID) | ORAL | Status: DC
  Filled 2012-10-22: qty 2

## 2012-10-22 MED ORDER — FUROSEMIDE 10 MG/ML IJ SOLN
40.0000 mg | Freq: Once | INTRAMUSCULAR | Status: AC
  Administered 2012-10-22: 40 mg via INTRAVENOUS

## 2012-10-22 MED ORDER — FUROSEMIDE 10 MG/ML IJ SOLN
INTRAMUSCULAR | Status: AC
  Filled 2012-10-22: qty 4

## 2012-10-22 MED ORDER — METOPROLOL TARTRATE 1 MG/ML IV SOLN
5.0000 mg | Freq: Once | INTRAVENOUS | Status: AC
  Administered 2012-10-22: 5 mg via INTRAVENOUS

## 2012-10-22 MED ORDER — DILTIAZEM HCL ER COATED BEADS 240 MG PO CP24
240.0000 mg | ORAL_CAPSULE | Freq: Every day | ORAL | Status: DC
  Administered 2012-10-23: 240 mg via ORAL
  Filled 2012-10-22 (×2): qty 1

## 2012-10-22 MED ORDER — FUROSEMIDE 10 MG/ML IJ SOLN
40.0000 mg | Freq: Four times a day (QID) | INTRAMUSCULAR | Status: AC
  Administered 2012-10-23 (×2): 40 mg via INTRAVENOUS
  Filled 2012-10-22 (×2): qty 4

## 2012-10-22 MED ORDER — ACETAZOLAMIDE SODIUM 500 MG IJ SOLR
500.0000 mg | Freq: Once | INTRAMUSCULAR | Status: AC
  Administered 2012-10-22: 500 mg via INTRAVENOUS
  Filled 2012-10-22: qty 500

## 2012-10-22 MED ORDER — POTASSIUM CHLORIDE CRYS ER 20 MEQ PO TBCR
40.0000 meq | EXTENDED_RELEASE_TABLET | Freq: Once | ORAL | Status: AC
  Administered 2012-10-22: 40 meq via ORAL
  Filled 2012-10-22: qty 2

## 2012-10-22 MED ORDER — POTASSIUM CHLORIDE 10 MEQ/50ML IV SOLN
10.0000 meq | INTRAVENOUS | Status: AC
  Administered 2012-10-22 – 2012-10-23 (×4): 10 meq via INTRAVENOUS
  Filled 2012-10-22: qty 200

## 2012-10-22 MED ORDER — POTASSIUM CHLORIDE CRYS ER 20 MEQ PO TBCR
40.0000 meq | EXTENDED_RELEASE_TABLET | Freq: Every day | ORAL | Status: DC
  Administered 2012-10-22 – 2012-10-23 (×2): 40 meq via ORAL
  Filled 2012-10-22 (×2): qty 2

## 2012-10-22 NOTE — Progress Notes (Signed)
ANTICOAGULATION CONSULT NOTE - Follow Up Consult  Pharmacy Consult for Coumadin Indication: Atrial fibrillation  Allergies  Allergen Reactions  . Ambien [Zolpidem Tartrate] Other (See Comments)    Severe hallucinations and behavior changes reported by family  . Ciprofloxacin     PATIENT AT VERY HIGH RISK FOR C DIFFICILE COLITIS  . Lunesta [Eszopiclone] Other (See Comments)    Nightmares & hallucinations  . Metformin And Related Swelling  . Protonix [Pantoprazole Sodium] Other (See Comments)    Unknown reaction-"Doctor told me to never take it"    Patient Measurements: Height: 5\' 5"  (165.1 cm) Weight: 253 lb 12 oz (115.1 kg) IBW/kg (Calculated) : 57   Vital Signs: Temp: 97.5 F (36.4 C) (08/20 0819) Temp src: Oral (08/20 0819) BP: 122/66 mmHg (08/20 0800) Pulse Rate: 79 (08/20 0800)  Labs:  Recent Labs  10/20/12 0423 10/21/12 0430 10/22/12 0420  HGB 9.8* 9.8* 9.0*  HCT 32.6* 32.5* 30.1*  PLT 496* 488* 413*  LABPROT 29.0* 24.6* 25.7*  INR 2.86* 2.31* 2.44*  CREATININE 0.64 0.52 0.46*    Estimated Creatinine Clearance: 97 ml/min (by C-G formula based on Cr of 0.46).   Assessment: Patient has new-onset atrial fibrillation and received a pacemaker on 8/14. Coumadin was started on 8/14 pre-pacer. INR up to 2.44 today after Coumadin 2 mg yesterday. No bleeding noted.   Goal of Therapy:  INR 2-3 Monitor platelets by anticoagulation protocol: Yes   Plan:  Coumadin 2 mg today Daily PT/INR  Tomie China, PharmD Candidate 10/22/2012,9:09 AM

## 2012-10-22 NOTE — Progress Notes (Signed)
Inpatient Diabetes Program Recommendations  AACE/ADA: New Consensus Statement on Inpatient Glycemic Control (2013)  Target Ranges:  Prepandial:   less than 140 mg/dL      Peak postprandial:   less than 180 mg/dL (1-2 hours)      Critically ill patients:  140 - 180 mg/dL   Reason for Visit: Results for [name], [name] (MRN [id number]) as of 10/22/2012 13:49  Ref. Range 10/21/2012 19:59 10/22/2012 00:15 10/22/2012 04:16 10/22/2012 07:59 10/22/2012 12:16  Glucose-Capillary Latest Range: 70-99 mg/dL 086 (H) 578 (H) 469 (H) 158 (H) 169 (H)   Note patient was taking Lantus 32 units daily prior to admit.  Consider restarting 1/2 of home dose of Lantus.  Will follow.

## 2012-10-22 NOTE — Progress Notes (Signed)
[facility] ADULT ICU REPLACEMENT PROTOCOL FOR AM LAB REPLACEMENT ONLY  The patient does apply for the [facility] Adult ICU Electrolyte Replacment Protocol based on the criteria listed below:   1. Is GFR >/= 40 ml/min? yes  Patient's GFR today is >90 2. Is urine output >/= 0.5 ml/kg/hr for the last 6 hours? yes Patient's UOP is 3.11 ml/kg/hr 3. Is BUN < 60 mg/dL? yes  Patient's BUN today is 11 4. Abnormal electrolyte(s): k+ 3.4 5. Ordered repletion with: see order  6. If a panic level lab has been reported, has the CCM MD in charge been notified? yes.   Physician:  Dr. Higinio Plan, Mazella Deen A 10/22/2012 6:47 AM

## 2012-10-22 NOTE — Progress Notes (Signed)
[facility] PCCM  Called emergently to bedside to evaluate Ms. Lezcano.  Apparently just prior to 1900 this evening she was to be transferred out of the ICU and then developed acute hypoxemic respiratory failure.  She was placed back on BIPAP, given lasix and metoprolol for rate control.  Upon my arrival she was breathing comfortably and asking to have the BIPAP mask removed.  Filed Vitals:   10/22/12 1837 10/22/12 1840 10/22/12 1925 10/22/12 1945  BP:   104/65   Pulse:  110 78   Temp:    98 F (36.7 C)  TempSrc:    Oral  Resp:  17 18   Height:      Weight:      SpO2: 99% 99% 100%    Gen: breathing comfortably on BIPAP HEENT: NCAT, BIPAP in place PULM: CTA B, diminshed sounds in bases bilat CV: RRR, no mgr AB: BS+, soft, nontender Ext: warm, compression stockings Neuro: Awake, conversant, oriented, maew  Impression: 1) Acute hypoxemic respiratory failure> sounds like primarily due to dead space breathing from anxiety attack in setting of pulm edema/pleural effusion, perhaps flash pulm edema from a-fib/rvr; resolved quickly with BIPAP;  2) Bilateral pleural effusion in setting of anasarca, R>L; most likely transudate from volume overload 3) Recurrent c.diff 4) OSA/Obesity hypoventilation syndrome 5) Afib with RVR > improved with metoprolol. 6) Metabolic alkalosis from diarrhea/hypokalemia from diuresis  Plan: -stay in ICU overnight -BIPAP QHS and prn -lasix  -replete K aggressively -diuresis for pleural effusion -repeat CXR in AM -metop for rate control  CC time for direct patient care, chart review : 45 minutes  Yolonda Kida PCCM Pager: [phone] Cell: [phone] If no response, call [phone]

## 2012-10-22 NOTE — Progress Notes (Signed)
INR remains therapeutic with no issues noted. Agree with above assessment and plan. Also discussed with Nadara Mustard, PharmD.  Anasia Agro C. Cozy Veale, PharmD Clinical Pharmacist-Resident 10/22/2012 9:30 AM

## 2012-10-22 NOTE — Progress Notes (Signed)
PULMONARY  / CRITICAL CARE MEDICINE  Name: [name] MRN: [id number] DOB: 03/05/1956    ADMISSION DATE:  10/09/2012 CONSULTATION DATE:  10/09/2012  REFERRING MD :  Vivia Ewing MD PRIMARY SERVICE: PCCM  CHIEF COMPLAINT:  Diarrhea  BRIEF PATIENT DESCRIPTION: 57 y/o female with a history of recurrent C.diff returned to the [facility] ED on 8/7 with confusion, diarrhea, subjective fevers, chills, and abdominal pain.  Found to be hypotensive with AKI.  BP initially improved with IVF in ED, but then shock. Admitted to 2100.  Transferred out of ICU and to [facility] 8/14. Transferred back to ICU 8/17 with hypercarbic resp failure. Notably, had received multiple doses of opioid analgesics and had been refusing nocturnal CPAP for 2-3 days prior to transfer back to ICU    LINES / TUBES: R IJ CVL 8/7 >> 8/19 PICC 8/19 >>   CULTURES: 8/7 blood >> NEG 8/7 urine >> NEG CDiff 8/8 >> NEG  ANTIBIOTICS: flagyl 8/7 >> 8/11  fidaxomicin 8/13 >>    SUBJECTIVE/OVERNIGHT:  No new complaints. No respiratory distress. AF controlled off dilt infusion  VITAL SIGNS: Temp:  [97.3 F (36.3 C)-98 F (36.7 C)] 98 F (36.7 C) (08/20 1238) Pulse Rate:  [79-95] 81 (08/20 1400) Resp:  [13-26] 18 (08/20 1400) BP: (99-132)/(56-92) 111/56 mmHg (08/20 1400) SpO2:  [92 %-100 %] 96 % (08/20 1400) Weight:  [115.1 kg (253 lb 12 oz)] 115.1 kg (253 lb 12 oz) (08/20 0500) HEMODYNAMICS:   VENTILATOR SETTINGS:   INTAKE / OUTPUT: Intake/Output     08/19 0701 - 08/20 0700 08/20 0701 - 08/21 0700   P.O. 480 240   I.V. (mL/kg) 2302.5 (20) 700 (6.1)   Total Intake(mL/kg) 2782.5 (24.2) 940 (8.2)   Urine (mL/kg/hr) 5600 (2) 950 (1)   Stool 1 (0) 1 (0)   Total Output 5601 951   Net -2818.5 -11          PHYSICAL EXAMINATION: Gen: RASS 0. + F/C. No distress HEENT: WNL PULM: improved BS diminished in RLL CV: Tachy, irreg, no M noted AB: obese, soft, NT, NABS Ext: anasarca improved Neuro: no focal  deficits  LABS:  CBC  Recent Labs Lab 10/20/12 0423 10/21/12 0430 10/22/12 0420  HGB 9.8* 9.8* 9.0*  HCT 32.6* 32.5* 30.1*  WBC 7.5 7.6 8.5  PLT 496* 488* 413*    Coag's Recent Labs     10/20/12  0423  10/21/12  0430  10/22/12  0420  INR  2.86*  2.31*  2.44*    BMET  Recent Labs Lab 10/16/12 0350  10/18/12 0500 10/19/12 0645 10/20/12 0423 10/21/12 0430 10/22/12 0420  NA 140  < > 143 143 143 146* 144  K 3.9  < > 3.6 4.0 4.2 3.4* 3.4*  CL 103  < > 104 103 103 102 98  CO2 28  < > 34* 30 32 37* 43*  GLUCOSE 164*  < > 155* 192* 221* 144* 141*  BUN 6  < > 5* 11 14 11 7   CREATININE 0.59  < > 0.49* 0.66 0.64 0.52 0.46*  CALCIUM 8.5  < > 8.3* 8.9 8.8 8.5 8.2*  MG 1.6  --   --   --   --   --   --   < > = values in this interval not displayed.  Electrolytes Recent Labs     10/20/12  0423  10/21/12  0430  10/22/12  0420  CALCIUM  8.8  8.5  8.2*   Sepsis  Markers No results found for this basename: LACTICACIDVEN, PROCALCITON, O2SATVEN,  in the last 72 hours  ABG  Recent Labs Lab 10/19/12 1409  PHART 7.167*  PCO2ART 89.1*  PO2ART 102.0*  HCO3 31.0*  TCO2 33.7  O2SAT 95.6   Liver Enzymes  Recent Labs Lab 10/16/12 0350  10/18/12 0500 10/19/12 0645 10/20/12 0423 10/21/12 0430 10/22/12 0420  AST 9  --   --   --   --   --   --   ALT 9  --   --   --   --   --   --   ALKPHOS 51  --   --   --   --   --   --   BILITOT 0.1*  --   --   --   --   --   --   PROT 4.7*  --   --   --   --   --   --   ALBUMIN 2.1*  --   --   --   --   --   --   INR  --   < > 2.17* 2.54* 2.86* 2.31* 2.44*  < > = values in this interval not displayed.  Cardiac Enzymes No results found for this basename: TROPONINI, PROBNP,  in the last 72 hours Glucose Recent Labs     10/21/12  1702  10/21/12  1959  10/22/12  0015  10/22/12  0416  10/22/12  0759  10/22/12  1216  GLUCAP  187*  206*  237*  127*  158*  169*    CXR: NSC R effusion, CM, vasc congestion  ASSESSMENT /  PLAN:   PULMONARY A: COPD OHS/OSA Acute hypercarbic resp failure due to opiates - resolved Chronic hypercarbic resp failure due to OHS P:   Cont BiPAP PRN Cont nocturnal BiPAP if she will wear it Cont PRN BDs Need to avoid ALL opioids!! 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 - placed because of prolonged pauses P:  Change diltiazem to q 12 hr formulation Cont warfarin per pharmacy  RENAL A:  AKI, resolved  Anasarca, severe hypervolemia - improving Hypernatremia, improving Hypokalemia Met alk due to loop diuretics P: Monitor BMET intermittently Correct electrolytes as indicated Cont diuresis as permitted by BP and renal function Hold further furosemide 8/20 Acetazolamide X 1 8/20 Cont spironolactone  GASTROINTESTINAL A: Obesity chronic colitis P:   Cont diet (begun 8/19)  HEMATOLOGIC A:  Mild anemia, no evidence of acute blood loss P:  -follow CBC intermittently  INFECTIOUS A:    Hx Recurrent C.diff P:   Cont dificid and enteral vanc as ordered  ENDOCRINE A:  DM2 P:   Cont SSI - changed to ACHS 8/19  NEUROLOGIC A:  Acute enceph due to hypercarbia - resolved Chronic pain syndrome Anxiety D/O P:   minimize sedating meds  Tramadol for pain  Trf to SDU 8/20. She is very tenuous as evidenced by her frequent returns to ICU. Will leave on PCCM service for now  Billy Fischer, MD ; [facility] [phone].  After 5:30 PM or weekends, call [phone]

## 2012-10-22 NOTE — Progress Notes (Signed)
eLink Physician-Brief Progress Note Patient Name: [name] DOB: [birth year] MRN: [id number]  Date of Service  10/22/2012   HPI/Events of Note  Pt in resp distress.  Pt was about to tfr to floor.   eICU Interventions  Keep in icu, may need intubation, needs R thoracentesis Give more lasix and metoprolol, See orders Use bipap. May need intubation  Bedside MD to see    Intervention Category Major Interventions: Respiratory failure - evaluation and management  Shan Levans 10/22/2012, 6:35 PM

## 2012-10-22 NOTE — Progress Notes (Signed)
Pt taken off NIV/PC and placed on 50% VM and tolerating well at this time, RT to monitor and assess as needed.

## 2012-10-23 ENCOUNTER — Inpatient Hospital Stay (HOSPITAL_COMMUNITY): Payer: Medicare HMO

## 2012-10-23 LAB — GLUCOSE, CAPILLARY: Glucose-Capillary: 265 mg/dL — ABNORMAL HIGH (ref 70–99)

## 2012-10-23 LAB — CBC
HCT: 33.7 % — ABNORMAL LOW (ref 36.0–46.0)
MCHC: 29.7 g/dL — ABNORMAL LOW (ref 30.0–36.0)
RDW: 20.5 % — ABNORMAL HIGH (ref 11.5–15.5)

## 2012-10-23 LAB — PROTIME-INR: Prothrombin Time: 22.8 seconds — ABNORMAL HIGH (ref 11.6–15.2)

## 2012-10-23 LAB — BASIC METABOLIC PANEL
BUN: 6 mg/dL (ref 6–23)
Calcium: 8.7 mg/dL (ref 8.4–10.5)
Creatinine, Ser: 0.49 mg/dL — ABNORMAL LOW (ref 0.50–1.10)
GFR calc Af Amer: >90 mL/min (ref >90)
GFR calc non Af Amer: >90 mL/min (ref >90)

## 2012-10-23 MED ORDER — BOOST / RESOURCE BREEZE PO LIQD
1.0000 | ORAL | Status: DC
  Administered 2012-10-24 – 2012-10-29 (×6): 1 via ORAL

## 2012-10-23 MED ORDER — ACETAZOLAMIDE SODIUM 500 MG IJ SOLR
500.0000 mg | Freq: Once | INTRAMUSCULAR | Status: AC
  Administered 2012-10-23: 500 mg via INTRAVENOUS
  Filled 2012-10-23: qty 500

## 2012-10-23 MED ORDER — WARFARIN SODIUM 4 MG PO TABS
4.0000 mg | ORAL_TABLET | Freq: Once | ORAL | Status: AC
  Administered 2012-10-23: 4 mg via ORAL
  Filled 2012-10-23: qty 1

## 2012-10-23 MED ORDER — ENSURE COMPLETE PO LIQD
237.0000 mL | Freq: Two times a day (BID) | ORAL | Status: DC
  Administered 2012-10-24 – 2012-10-30 (×14): 237 mL via ORAL

## 2012-10-23 NOTE — Progress Notes (Signed)
NUTRITION FOLLOW-UP  DOCUMENTATION CODES Per approved criteria  -Morbid Obesity   INTERVENTION: 1.  Supplements; Ensure Complete po BID, each supplement provides 350 kcal and 13 grams of protein. 2.  General healthful diet; encouraged intake.  Discussed progress and nutrient needs.    NUTRITION DIAGNOSIS: Inadequate oral intake related to altered gi function as evidenced by limited diet order.  Goal: Pt to meet >/= 90% of their estimated nutrition needs - unmet.  Monitor:  Diet advancement, PO intake, weight trend, labs  ASSESSMENT: Pt with hx of c.diff since 02/2012. Pt admitted with confusion, diarrhea, fevers, chills and abd pain. Pt found to be hypotensive with AKI.   Pt diet advanced to CHO Mod Medium with poor PO intake.  Pt has been drinking Breeze BID on average.   Pt reports she has increased WOB and this is limiting her intake.  Discussed supplement use and encouraged small frequent meals.    Height: Ht Readings from Last 1 Encounters:  10/10/12 5\' 5"  (1.651 m)    Weight: Wt Readings from Last 1 Encounters:  10/23/12 244 lb 0.8 oz (110.7 kg)    BMI:  Body mass index is 40.61 kg/(m^2). Obese Class III  Estimated Nutritional Needs: Kcal: 2000-2200  Protein: 100-115 grams  Fluid: > 2 L/day   Skin:  L chest incision  Diet Order: Carb Control  EDUCATION NEEDS: -No education needs identified at this time   Intake/Output Summary (Last 24 hours) at 10/23/12 1223 Last data filed at 10/23/12 1000  Gross per 24 hour  Intake   1235 ml  Output   6950 ml  Net  -5715 ml    Last BM: 8/20, diarrhea Labs:   Recent Labs Lab 10/21/12 0430 10/22/12 0420 10/23/12 0440  NA 146* 144 141  K 3.4* 3.4* 4.3  CL 102 98 93*  CO2 37* 43* >45*  BUN 11 7 6   CREATININE 0.52 0.46* 0.49*  CALCIUM 8.5 8.2* 8.7  GLUCOSE 144* 141* 191*    CBG (last 3)   Recent Labs  10/22/12 2153 10/23/12 0745 10/23/12 1118  GLUCAP 158* 187* 231*   Scheduled Meds: .  antiseptic oral rinse  15 mL Mouth Rinse q12n4p  . chlorhexidine  15 mL Mouth Rinse BID  . diltiazem  240 mg Oral Daily  . feeding supplement  1 Container Oral TID WC  . fidaxomicin  200 mg Oral BID  . insulin aspart  0-15 Units Subcutaneous TID WC  . insulin aspart  0-5 Units Subcutaneous QHS  . insulin aspart  3 Units Subcutaneous TID WC  . sodium chloride  10-40 mL Intracatheter Q12H  . [START ON 10/27/2012] vancomycin  125 mg Oral QID   Followed by  . [START ON 11/03/2012] vancomycin  125 mg Oral BID   Followed by  . [START ON 11/10/2012] vancomycin  125 mg Oral Daily   Followed by  . [START ON 11/17/2012] vancomycin  125 mg Oral QODAY   Followed by  . [START ON 11/25/2012] vancomycin  125 mg Oral Q3 days  . warfarin  4 mg Oral ONCE-1800  . Warfarin - Pharmacist Dosing Inpatient   Does not apply q1800    Continuous Infusions:   Loyce Dys, MS RD LDN Clinical Inpatient Dietitian Pager: [phone] Weekend/After hours pager: [phone]

## 2012-10-23 NOTE — Progress Notes (Signed)
PULMONARY  / CRITICAL CARE MEDICINE  Name: [name] MRN: [id number] DOB: 1956-01-10    ADMISSION DATE:  10/09/2012 CONSULTATION DATE:  10/09/2012  REFERRING MD :  Vivia Ewing MD PRIMARY SERVICE: PCCM  CHIEF COMPLAINT:  Diarrhea  BRIEF PATIENT DESCRIPTION: 57 y/o female with a history of recurrent C.diff returned to the [facility] ED on 8/7 with confusion, diarrhea, subjective fevers, chills, and abdominal pain.  Found to be hypotensive with AKI.  BP initially improved with IVF in ED, but then shock. Admitted to 2100.  Transferred out of ICU and to [facility] 8/14. Transferred back to ICU 8/17 with hypercarbic resp failure. Notably, had received multiple doses of opioid analgesics and had been refusing nocturnal CPAP for 2-3 days prior to transfer back to ICU    LINES / TUBES: R IJ CVL 8/7 >> 8/19 PICC 8/19 >>   CULTURES: Blood 8/7 >> NEG Urine 8/7 >> NEG C diff 8/8 >> NEG  ANTIBIOTICS: flagyl 8/7 >> 8/11  fidaxomicin 8/13 >>   SUBJECTIVE/OVERNIGHT:  Episode of dyspnea last PM requiring NPPV. Received lasix. Much improved presently  VITAL SIGNS: Temp:  [97 F (36.1 C)-98.2 F (36.8 C)] 98.2 F (36.8 C) (08/21 1119) Pulse Rate:  [50-153] 103 (08/21 1200) Resp:  [13-28] 16 (08/21 1200) BP: (96-132)/(47-97) 117/73 mmHg (08/21 1200) SpO2:  [96 %-100 %] 97 % (08/21 1200) FiO2 (%):  [35 %-100 %] 35 % (08/21 1200) Weight:  [110.7 kg (244 lb 0.8 oz)] 110.7 kg (244 lb 0.8 oz) (08/21 0500) HEMODYNAMICS:   VENTILATOR SETTINGS: Vent Mode:  [-]  FiO2 (%):  [35 %-100 %] 35 % Set Rate:  [15 bmp] 15 bmp PEEP:  [10 cmH20] 10 cmH20 INTAKE / OUTPUT: Intake/Output     08/20 0701 - 08/21 0700 08/21 0701 - 08/22 0700   P.O. 240    I.V. (mL/kg) 1425 (12.9) 110 (1)   IV Piggyback 200    Total Intake(mL/kg) 1865 (16.8) 110 (1)   Urine (mL/kg/hr) 6025 (2.3) 1400 (2.4)   Stool 1 (0)    Total Output 6026 1400   Net -4161 -1290        Stool Occurrence 1 x      PHYSICAL EXAMINATION: Gen: RASS 0.  + F/C. No distress HEENT: WNL PULM: improved BS diminished in RLL CV: Tachy, irreg, no M noted AB: obese, soft, NT, NABS Ext: BUE, BLE edema improving Neuro: no focal deficits  LABS:  CBC  Recent Labs Lab 10/21/12 0430 10/22/12 0420 10/23/12 0440  HGB 9.8* 9.0* 10.0*  HCT 32.5* 30.1* 33.7*  WBC 7.6 8.5 7.9  PLT 488* 413* 481*    Coag's Recent Labs     10/21/12  0430  10/22/12  0420  10/23/12  0440  INR  2.31*  2.44*  2.09*    BMET  Recent Labs Lab 10/19/12 0645 10/20/12 0423 10/21/12 0430 10/22/12 0420 10/23/12 0440  NA 143 143 146* 144 141  K 4.0 4.2 3.4* 3.4* 4.3  CL 103 103 102 98 93*  CO2 30 32 37* 43* >45*  GLUCOSE 192* 221* 144* 141* 191*  BUN 11 14 11 7 6   CREATININE 0.66 0.64 0.52 0.46* 0.49*  CALCIUM 8.9 8.8 8.5 8.2* 8.7    Electrolytes Recent Labs     10/21/12  0430  10/22/12  0420  10/23/12  0440  CALCIUM  8.5  8.2*  8.7   Sepsis Markers No results found for this basename: LACTICACIDVEN, PROCALCITON, O2SATVEN,  in the last 72  hours  ABG  Recent Labs Lab 10/19/12 1409  PHART 7.167*  PCO2ART 89.1*  PO2ART 102.0*  HCO3 31.0*  TCO2 33.7  O2SAT 95.6   Liver Enzymes  Recent Labs Lab 10/19/12 0645 10/20/12 0423 10/21/12 0430 10/22/12 0420 10/23/12 0440  INR 2.54* 2.86* 2.31* 2.44* 2.09*    Cardiac Enzymes No results found for this basename: TROPONINI, PROBNP,  in the last 72 hours Glucose Recent Labs     10/22/12  1216  10/22/12  1536  10/22/12  1935  10/22/12  2153  10/23/12  0745  10/23/12  1118  GLUCAP  169*  166*  201*  158*  187*  231*    CXR: NSC R effusion, CM, vasc congestion  ASSESSMENT / PLAN:   PULMONARY A: COPD OHS/OSA R pleural effusion Mild pulmonary edema Acute hypercarbic resp failure due to opiates - resolved 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 - placed because of prolonged pauses P:  Changed to diltiazem CD formulation 8/21 Cont warfarin per pharmacy  RENAL A:  AKI, resolved  Anasarca, severe hypervolemia - improving Hypernatremia, improving Hypokalemia Met alk due to loop diuretics P: Monitor BMET intermittently Correct electrolytes as indicated Hold further furosemide 8/21 as appears to be auto-diuresing Acetazolamide X 1 8/21 for met alk Hold further spironolactone  GASTROINTESTINAL A: Obesity chronic colitis P:   Cont diet (begun 8/19)  HEMATOLOGIC A:  Mild anemia, no evidence of acute blood loss P:  -follow CBC intermittently  INFECTIOUS A:    Hx Recurrent C.diff P:   Cont dificid and enteral vanc as ordered  ENDOCRINE A:  DM2 P:   Cont SSI - changed to ACHS 8/19  NEUROLOGIC A:  Acute enceph due to hypercarbia - resolved Chronic pain syndrome Anxiety D/O P:   minimize sedating meds  Tramadol for pain  Because of events of 8/20 PM, will watch in ICU today  Billy Fischer, MD ; [facility] service Mobile [phone].  After 5:30 PM or weekends, call [phone]

## 2012-10-23 NOTE — Progress Notes (Signed)
ANTICOAGULATION CONSULT NOTE - Follow Up Consult  Pharmacy Consult for Coumadin Indication: Atrial fibrillation  Allergies  Allergen Reactions  . Ambien [Zolpidem Tartrate] Other (See Comments)    Severe hallucinations and behavior changes reported by family  . Ciprofloxacin     PATIENT AT VERY HIGH RISK FOR C DIFFICILE COLITIS  . Lunesta [Eszopiclone] Other (See Comments)    Nightmares & hallucinations  . Metformin And Related Swelling  . Protonix [Pantoprazole Sodium] Other (See Comments)    Unknown reaction-"Doctor told me to never take it"    Patient Measurements: Height: 5\' 5"  (165.1 cm) Weight: 244 lb 0.8 oz (110.7 kg) IBW/kg (Calculated) : 57  Vital Signs: Temp: 97 F (36.1 C) (08/21 0806) Temp src: Oral (08/21 0806) BP: 125/97 mmHg (08/21 0900) Pulse Rate: 50 (08/21 0900)  Labs:  Recent Labs  10/21/12 0430 10/22/12 0420 10/23/12 0440  HGB 9.8* 9.0* 10.0*  HCT 32.5* 30.1* 33.7*  PLT 488* 413* 481*  LABPROT 24.6* 25.7* 22.8*  INR 2.31* 2.44* 2.09*  CREATININE 0.52 0.46* 0.49*    Estimated Creatinine Clearance: 95 ml/min (by C-G formula based on Cr of 0.49).   Assessment: Patient has new-onset atrial fibrillation and received a pacemaker on 8/14. Coumadin was started on 8/14 pre-pacer. INR down to 2.09 today after Coumadin 2 mg yesterday. No bleeding noted.    Goal of Therapy:  INR 2-3 Monitor platelets by anticoagulation protocol: Yes   Plan:  Coumadin 4 mg today  Daily PT/INR  Roxanna Mew D Candidate 10/23/2012,9:34 AM

## 2012-10-23 NOTE — Progress Notes (Signed)
Agree with assessment and plan. Plan confirmed and agreed upon with Georgina Pillion, PharmD.  Wavie Hashimi C. Kreig Parson, PharmD Clinical Pharmacist-Resident 10/23/2012 9:48 AM

## 2012-10-24 LAB — BASIC METABOLIC PANEL
Calcium: 8.6 mg/dL (ref 8.4–10.5)
GFR calc Af Amer: >90 mL/min (ref >90)
GFR calc non Af Amer: >90 mL/min (ref >90)
Potassium: 3.9 mEq/L (ref 3.5–5.1)
Sodium: 139 mEq/L (ref 135–145)

## 2012-10-24 LAB — CBC
HCT: 27.2 % — ABNORMAL LOW (ref 36.0–46.0)
Hemoglobin: 8.3 g/dL — ABNORMAL LOW (ref 12.0–15.0)
Hemoglobin: 9.5 g/dL — ABNORMAL LOW (ref 12.0–15.0)
MCH: 27.8 pg (ref 26.0–34.0)
MCHC: 30.5 g/dL (ref 30.0–36.0)
Platelets: 403 10*3/uL — ABNORMAL HIGH (ref 150–400)
RBC: 3.42 MIL/uL — ABNORMAL LOW (ref 3.87–5.11)
WBC: 5.8 10*3/uL (ref 4.0–10.5)

## 2012-10-24 LAB — PROTIME-INR
INR: 2.12 — ABNORMAL HIGH (ref 0.00–1.49)
INR: 2.12 — ABNORMAL HIGH (ref 0.00–1.49)
Prothrombin Time: 23.1 seconds — ABNORMAL HIGH (ref 11.6–15.2)

## 2012-10-24 LAB — GLUCOSE, CAPILLARY
Glucose-Capillary: 165 mg/dL — ABNORMAL HIGH (ref 70–99)
Glucose-Capillary: 165 mg/dL — ABNORMAL HIGH (ref 70–99)
Glucose-Capillary: 277 mg/dL — ABNORMAL HIGH (ref 70–99)

## 2012-10-24 MED ORDER — IPRATROPIUM BROMIDE 0.02 % IN SOLN
0.5000 mg | Freq: Four times a day (QID) | RESPIRATORY_TRACT | Status: DC
  Administered 2012-10-24 – 2012-10-30 (×26): 0.5 mg via RESPIRATORY_TRACT
  Filled 2012-10-24 (×27): qty 2.5

## 2012-10-24 MED ORDER — DILTIAZEM HCL ER COATED BEADS 300 MG PO CP24
300.0000 mg | ORAL_CAPSULE | Freq: Every day | ORAL | Status: DC
  Administered 2012-10-24 – 2012-10-28 (×5): 300 mg via ORAL
  Filled 2012-10-24 (×5): qty 1

## 2012-10-24 MED ORDER — ALPRAZOLAM 0.25 MG PO TABS
0.2500 mg | ORAL_TABLET | ORAL | Status: DC | PRN
  Administered 2012-10-24 – 2012-10-30 (×12): 0.25 mg via ORAL
  Filled 2012-10-24 (×12): qty 1

## 2012-10-24 MED ORDER — WARFARIN SODIUM 3 MG PO TABS
3.0000 mg | ORAL_TABLET | Freq: Once | ORAL | Status: AC
  Administered 2012-10-24: 3 mg via ORAL
  Filled 2012-10-24: qty 1

## 2012-10-24 MED ORDER — LEVALBUTEROL HCL 0.63 MG/3ML IN NEBU: 0.6300 mg | INHALATION_SOLUTION | Freq: Four times a day (QID) | RESPIRATORY_TRACT | Status: DC

## 2012-10-24 MED ORDER — DIGOXIN 250 MCG PO TABS
0.2500 mg | ORAL_TABLET | Freq: Every day | ORAL | Status: DC
  Administered 2012-10-24 – 2012-10-30 (×7): 0.25 mg via ORAL
  Filled 2012-10-24 (×7): qty 1

## 2012-10-24 MED ORDER — SERTRALINE HCL 50 MG PO TABS
50.0000 mg | ORAL_TABLET | Freq: Every day | ORAL | Status: DC
  Administered 2012-10-24 – 2012-10-30 (×7): 50 mg via ORAL
  Filled 2012-10-24 (×8): qty 1

## 2012-10-24 MED ORDER — DIGOXIN 0.25 MG/ML IJ SOLN
0.5000 mg | Freq: Once | INTRAMUSCULAR | Status: AC
  Administered 2012-10-24: 0.5 mg via INTRAVENOUS
  Filled 2012-10-24: qty 2

## 2012-10-24 MED ORDER — LEVALBUTEROL HCL 0.63 MG/3ML IN NEBU
0.6300 mg | INHALATION_SOLUTION | Freq: Four times a day (QID) | RESPIRATORY_TRACT | Status: DC
  Administered 2012-10-24 – 2012-10-30 (×26): 0.63 mg via RESPIRATORY_TRACT
  Filled 2012-10-24 (×44): qty 3

## 2012-10-24 MED ORDER — BUDESONIDE 0.25 MG/2ML IN SUSP
0.2500 mg | Freq: Four times a day (QID) | RESPIRATORY_TRACT | Status: DC
  Administered 2012-10-24 – 2012-10-30 (×26): 0.25 mg via RESPIRATORY_TRACT
  Filled 2012-10-24 (×30): qty 2

## 2012-10-24 MED ORDER — LEVALBUTEROL HCL 0.63 MG/3ML IN NEBU
0.6300 mg | INHALATION_SOLUTION | RESPIRATORY_TRACT | Status: DC | PRN
  Filled 2012-10-24 (×3): qty 3

## 2012-10-24 MED ORDER — BUDESONIDE 0.25 MG/2ML IN SUSP
0.2500 mg | Freq: Four times a day (QID) | RESPIRATORY_TRACT | Status: DC
  Filled 2012-10-24 (×4): qty 2

## 2012-10-24 NOTE — Progress Notes (Signed)
Physical Therapy Treatment Patient Details Name: [name] MRN: [id number] DOB: 11-17-1955 Today's Date: 10/24/2012 Time: 0725-0753 PT Time Calculation (min): 28 min  PT Assessment / Plan / Recommendation  History of Present Illness This is a 57 y/o female who presented on 8/7 with confusion, diarrhea, abd pain. She was hospitalized last December with ARDS from influenza and probable secondary pneumococcal pneumonia. For the tail end of her admission (in Jan) she developed C. difficile colitis. She was transferred to [facility] for ventilator weaning and rehab. She received a full course of treatment but continued to have diarrhea after she was discharged home (2/14). She returned to the hospital on 7/8 for diarrhea and hypotension- transferred to ICU on 7/9 for persisent hypotension and resp failure - intubated and placed on pressors. Found to have C diff colitis again. She was re-admitted on 7/28, treated again for C Diff and discharged on 8/2. This time had social services arrange outpt Vanc. She was re-admitted on 8/7 for diarrhea and hypotension. Pacemaker on 10/16/2012. Transferred back to ICU 8/17 with hypercarbic resp failure. Notably, had received multiple doses of opioid analgesics and had been refusing nocturnal CPAP for 2-3 days prior to transfer back to ICU   PT Comments   Pt with progression with activity able to ambulate short distance today but limited by respiratory difficulty. Pt eager to get OOB today and with more positive attitude for improvement. Pt educated and encouraged to perform bil LE HEP and OOB daily with nursing. Will continue to follow.   Follow Up Recommendations        Does the patient have the potential to tolerate intense rehabilitation     Barriers to Discharge        Equipment Recommendations       Recommendations for Other Services    Frequency     Progress towards PT Goals Progress towards PT goals: Progressing toward goals (slowly due to medical  complications)  Plan Current plan remains appropriate    Precautions / Restrictions Precautions Precautions: Fall Precaution Comments: contact--brown, bipap   Pertinent Vitals/Pain Pt with chronic back pain 7/10 RN aware sats 90-99% on Bipap with activity with brief drop to 80% with standing HR 130-155 with movement , RN present pt asymptomatic    Mobility  Bed Mobility Supine to Sit: 4: Min assist;HOB elevated;With rails Sitting - Scoot to Edge of Bed: 4: Min assist Details for Bed Mobility Assistance: cueing for sequence and assist with increased time and pad to scoot forward, HOB 30degrees, pivoted to right Transfers Sit to Stand: 3: Mod assist;From bed Stand to Sit: 4: Min assist;With armrests;To chair/3-in-1 Details for Transfer Assistance: cueing for hand placement, safety  Ambulation/Gait Ambulation/Gait Assistance: 1: +2 Total assist Ambulation/Gait: Patient Percentage: 80% Ambulation Distance (Feet): 5 Feet Assistive device: Rolling walker Ambulation/Gait Assistance Details: cueing for posture, hand placement and chair to follow with pt limited by respiratory status and fatigue. Pt with HR 150-155 as soon as she pivoted to EOB and remained there with pt asymptomatic stating she feels anxious, RN aware and present end of session Gait Pattern: Step-through pattern;Decreased stride length Gait velocity: decreased Stairs: No    Exercises General Exercises - Lower Extremity Long Arc Quad: AROM;Both;15 reps;Seated Hip Flexion/Marching: AROM;Both;Other reps (comment);Seated (2 reps)   PT Diagnosis:    PT Problem List:   PT Treatment Interventions:     PT Goals (current goals can now be found in the care plan section)    Visit Information  Last  PT Received On: 10/24/12 Assistance Needed: +2 (safety with mobility) History of Present Illness: This is a 57 y/o female who presented on 8/7 with confusion, diarrhea, abd pain. She was hospitalized last December with ARDS from  influenza and probable secondary pneumococcal pneumonia. For the tail end of her admission (in Jan) she developed C. difficile colitis. She was transferred to [facility] for ventilator weaning and rehab. She received a full course of treatment but continued to have diarrhea after she was discharged home (2/14). She returned to the hospital on 7/8 for diarrhea and hypotension- transferred to ICU on 7/9 for persisent hypotension and resp failure - intubated and placed on pressors. Found to have C diff colitis again. She was re-admitted on 7/28, treated again for C Diff and discharged on 8/2. This time had social services arrange outpt Vanc. She was re-admitted on 8/7 for diarrhea and hypotension. Pacemaker on 10/16/2012. Transferred back to ICU 8/17 with hypercarbic resp failure. Notably, had received multiple doses of opioid analgesics and had been refusing nocturnal CPAP for 2-3 days prior to transfer back to ICU    Subjective Data      Cognition  Cognition Arousal/Alertness: Awake/alert Behavior During Therapy: Flat affect Overall Cognitive Status: Within Functional Limits for tasks assessed    Balance     End of Session PT - End of Session Equipment Utilized During Treatment: Gait belt;Oxygen Activity Tolerance: Patient limited by fatigue Patient left: in chair;with call bell/phone within reach;with nursing/sitter in room Nurse Communication: Mobility status   GP     Toney Sang [facility] 10/24/2012, 8:39 AM Delaney Meigs, PT [phone]

## 2012-10-24 NOTE — Progress Notes (Signed)
PULMONARY  / CRITICAL CARE MEDICINE  Name: [name] MRN: [id number] DOB: August 24, 1955    ADMISSION DATE:  10/09/2012 CONSULTATION DATE:  10/09/2012  REFERRING MD :  Vivia Ewing MD PRIMARY SERVICE: PCCM  CHIEF COMPLAINT:  Diarrhea  BRIEF PATIENT DESCRIPTION: 57 y/o female with a history of recurrent C.diff returned to the [facility] ED on 8/7 with confusion, diarrhea, subjective fevers, chills, and abdominal pain.  Found to be hypotensive with AKI.  BP initially improved with IVF in ED, but then shock. Admitted to 2100.  Transferred out of ICU and to [facility] 8/14. Transferred back to ICU 8/17 with hypercarbic resp failure. Notably, had received multiple doses of opioid analgesics and had been refusing nocturnal CPAP for 2-3 days prior to transfer back to ICU    LINES / TUBES: R IJ CVL 8/7 >> 8/19 PICC 8/19 >>   CULTURES: Blood 8/7 >> NEG Urine 8/7 >> NEG C diff 8/8 >> NEG  ANTIBIOTICS: flagyl 8/7 >> 8/11  fidaxomicin 8/13 >> 8/25 (planned)  SUBJECTIVE/OVERNIGHT:  Anxious, dyspneic, tachycardic (2:1 flutter)  VITAL SIGNS: Temp:  [97.4 F (36.3 C)-97.9 F (36.6 C)] 97.4 F (36.3 C) (08/22 1156) Pulse Rate:  [54-154] 78 (08/22 1200) Resp:  [13-27] 16 (08/22 1100) BP: (105-143)/(61-98) 119/62 mmHg (08/22 1200) SpO2:  [96 %-100 %] 98 % (08/22 1248) FiO2 (%):  [35 %-50 %] 50 % (08/21 2000) Weight:  [106.8 kg (235 lb 7.2 oz)-110.7 kg (244 lb 0.8 oz)] 106.8 kg (235 lb 7.2 oz) (08/22 0500) HEMODYNAMICS:   VENTILATOR SETTINGS: Vent Mode:  [-] BIPAP FiO2 (%):  [35 %-50 %] 50 % Set Rate:  [15 bmp] 15 bmp INTAKE / OUTPUT: Intake/Output     08/21 0701 - 08/22 0700 08/22 0701 - 08/23 0700   P.O.  600   I.V. (mL/kg) 110 (1)    IV Piggyback     Total Intake(mL/kg) 110 (1) 600 (5.6)   Urine (mL/kg/hr) 3960 (1.5) 700 (1)   Stool     Total Output 3960 700   Net -3850 -100          PHYSICAL EXAMINATION: Gen: RASS 0. + F/C. Mildly dyspneic @ rest HEENT: WNL PULM: markedly diminished  throughout, no wheezes CV: Tachy, irreg, no M noted AB: obese, soft, NT, NABS Ext: BUE, BLE edema improving Neuro: no focal deficits  LABS:  CBC  Recent Labs Lab 10/23/12 0440 10/24/12 0422 10/24/12 0530  HGB 10.0* 8.3* 9.5*  HCT 33.7* 27.2* 31.6*  WBC 7.9 9.8 5.8  PLT 481* 276 403*    Coag's Recent Labs     10/23/12  0440  10/24/12  0422  10/24/12  0530  INR  2.09*  2.12*  2.12*    BMET  Recent Labs Lab 10/20/12 0423 10/21/12 0430 10/22/12 0420 10/23/12 0440 10/24/12 0530  NA 143 146* 144 141 139  K 4.2 3.4* 3.4* 4.3 3.9  CL 103 102 98 93* 94*  CO2 32 37* 43* >45* 42*  GLUCOSE 221* 144* 141* 191* 168*  BUN 14 11 7 6 7   CREATININE 0.64 0.52 0.46* 0.49* 0.48*  CALCIUM 8.8 8.5 8.2* 8.7 8.6    Electrolytes Recent Labs     10/22/12  0420  10/23/12  0440  10/24/12  0530  CALCIUM  8.2*  8.7  8.6   Sepsis Markers No results found for this basename: LACTICACIDVEN, PROCALCITON, O2SATVEN,  in the last 72 hours  ABG  Recent Labs Lab 10/19/12 1409  PHART 7.167*  PCO2ART  89.1*  PO2ART 102.0*  HCO3 31.0*  TCO2 33.7  O2SAT 95.6   Liver Enzymes  Recent Labs Lab 10/21/12 0430 10/22/12 0420 10/23/12 0440 10/24/12 0422 10/24/12 0530  INR 2.31* 2.44* 2.09* 2.12* 2.12*    Cardiac Enzymes No results found for this basename: TROPONINI, PROBNP,  in the last 72 hours Glucose Recent Labs     10/23/12  0745  10/23/12  1118  10/23/12  1544  10/23/12  2110  10/24/12  0758  10/24/12  1145  GLUCAP  187*  231*  153*  265*  165*  165*    CXR: NNF  ASSESSMENT / PLAN:   PULMONARY A: COPD OHS/OSA R pleural effusion Mild pulmonary edema Acute hypercarbic resp failure due to opiates - resolved 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 - placed because of prolonged pauses P:  Increase dilt to  300 mg/d 8/22 Add digoxin 8/22 Cont warfarin per pharmacy  RENAL A:  AKI, resolved  Anasarca, severe hypervolemia - improving Hypernatremia, improving Hypokalemia, recurrent Met alk due to loop diuretics P: Monitor BMET intermittently Correct electrolytes as indicated Holding further diuretics as she appears to be auto-diuresing  GASTROINTESTINAL A: Obesity chronic colitis P:   Cont diet (begun 8/19)  HEMATOLOGIC A:  Mild anemia, no evidence of acute blood loss P:  -follow CBC intermittently  INFECTIOUS A:    Hx Recurrent C.diff P:   Cont dificid through 8/24 Vanc taper beginning 8/25 as ordered  ENDOCRINE A:  DM2, adequately controlled P:   Cont SSI - changed to ACHS 8/19  NEUROLOGIC A:  Acute enceph due to hypercarbia, resolved Chronic pain syndrome Anxiety D/O P:   Avoid opioids Tramadol PRN for pain PRN alprazolam begun 8/22   Remains tenuous, will watch in ICU today  Billy Fischer, MD ; [facility] service Mobile [phone].  After 5:30 PM or weekends, call [phone]

## 2012-10-24 NOTE — Progress Notes (Signed)
Occupational Therapy Treatment Patient Details Name: LURA FALOR MRN: [id number] DOB: 1956-01-26 Today's Date: 10/24/2012 Time: 4401-0272 OT Time Calculation (min): 16 min  OT Assessment / Plan / Recommendation  History of present illness This is a 57 y/o female who presented on 8/7 with confusion, diarrhea, abd pain. She was hospitalized last December with ARDS from influenza and probable secondary pneumococcal pneumonia. For the tail end of her admission (in Jan) she developed C. difficile colitis. She was transferred to [facility] for ventilator weaning and rehab. She received a full course of treatment but continued to have diarrhea after she was discharged home (2/14). She returned to the hospital on 7/8 for diarrhea and hypotension- transferred to ICU on 7/9 for persisent hypotension and resp failure - intubated and placed on pressors. Found to have C diff colitis again. She was re-admitted on 7/28, treated again for C Diff and discharged on 8/2. This time had social services arrange outpt [facility]. She was re-admitted on 8/7 for diarrhea and hypotension. Pacemaker on 10/16/2012. Transferred back to ICU 8/17 with hypercarbic resp failure. Notably, had received multiple doses of opioid analgesics and had been refusing nocturnal CPAP for 2-3 days prior to transfer back to ICU   OT comments  Pt with limited participation. Nsg asked to complete therapy bedside and not get OOB again this pm due to pt becoming tachy. Pt completed BUE strengthening bedside without resistance to LUE due to pacemaker. Pt incontinent and attempted to assist pt with cleaning, however, pt refused and stated she wanted to be put back on bipap in order to complete bed mobility for cleaning. Nsg aware.  Follow Up Recommendations  SNF    Barriers to Discharge       Equipment Recommendations  None recommended by OT    Recommendations for Other Services    Frequency Min 2X/week   Progress towards OT Goals Progress towards OT  goals: Progressing toward goals  Plan Discharge plan remains appropriate    Precautions / Restrictions Precautions Precautions: Fall Precaution Comments: contact--brown, bipap; pacemaker postop @ 1 week   Pertinent Vitals/Pain 3L 97 with UE exercises    ADL  ADL Comments: Discussed need for pt to assist with ADL as she is able to do . Pt incontinent of BM during session    OT Diagnosis:    OT Problem List:   OT Treatment Interventions:     OT Goals(current goals can now be found in the care plan section) Acute Rehab OT Goals Patient Stated Goal: to go home and be stronger OT Goal Formulation: Patient unable to participate in goal setting Time For Goal Achievement: 11/03/12 Potential to Achieve Goals: Good ADL Goals Pt Will Perform Grooming: with mod assist;bed level Additional ADL Goal #1: Pt will roll right and left with Mod A to A with BADLs Additional ADL Goal #2: Pt will come up to sit with Mod A as precursor to BADLs  Visit Information  Last OT Received On: 10/24/12 Assistance Needed: +2 History of Present Illness: This is a 57 y/o female who presented on 8/7 with confusion, diarrhea, abd pain. She was hospitalized last December with ARDS from influenza and probable secondary pneumococcal pneumonia. For the tail end of her admission (in Jan) she developed C. difficile colitis. She was transferred to [facility] for ventilator weaning and rehab. She received a full course of treatment but continued to have diarrhea after she was discharged home (2/14). She returned to the hospital on 7/8 for diarrhea and hypotension- transferred  to ICU on 7/9 for persisent hypotension and resp failure - intubated and placed on pressors. Found to have C diff colitis again. She was re-admitted on 7/28, treated again for C Diff and discharged on 8/2. This time had social services arrange outpt [facility]. She was re-admitted on 8/7 for diarrhea and hypotension. Pacemaker on 10/16/2012. Transferred back to ICU 8/17  with hypercarbic resp failure. Notably, had received multiple doses of opioid analgesics and had been refusing nocturnal CPAP for 2-3 days prior to transfer back to ICU    Subjective Data      Prior Functioning       Cognition  Cognition Arousal/Alertness: Awake/alert Behavior During Therapy: WFL for tasks assessed/performed Overall Cognitive Status: No family/caregiver present to determine baseline cognitive functioning    Mobility  Bed Mobility Bed Mobility:  (pt refused to roll to clean up from incontinent episode ) Transfers Transfers: Not assessed    Exercises   BUE  AROM. Encouraged pt to complete exercise on her own . Also encouraged B ankle pumps and leg ROM    Balance     End of Session OT - End of Session Activity Tolerance: Patient limited by fatigue Patient left: in bed;with call bell/phone within reach Nurse Communication: Mobility status  GO     Brennon Otterness,HILLARY 10/24/2012, 5:34 PM [facility], OTR/L  [phone] 10/24/2012

## 2012-10-24 NOTE — Progress Notes (Signed)
Inpatient Diabetes Program Recommendations  AACE/ADA: New Consensus Statement on Inpatient Glycemic Control (2013)  Target Ranges:  Prepandial:   less than 140 mg/dL      Peak postprandial:   less than 180 mg/dL (1-2 hours)      Critically ill patients:  140 - 180 mg/dL    Results for NIAMBI, SMOAK (MRN [id number]) as of 10/24/2012 08:10  Ref. Range 10/23/2012 07:45 10/23/2012 11:18 10/23/2012 15:44 10/23/2012 21:10  Glucose-Capillary Latest Range: 70-99 mg/dL 409 (H) 811 (H) 914 (H) 265 (H)    **Patient takes Lantus 32 units QHS at home.  Having some glucose elevations.  **MD-Please consider adding 1/3 patient's home dose of Lantus (Lantus 10 units QHS)   Will follow. Ambrose Finland RN, MSN, CDE Diabetes Coordinator Inpatient Diabetes Program [phone]

## 2012-10-24 NOTE — Progress Notes (Signed)
Agree with assessment and plan. Also discussed and plan agreed upon and confirmed by Georgina Pillion, PharmD.  Walda Hertzog C. July Linam, PharmD Clinical Pharmacist-Resident 10/24/2012 11:17 AM

## 2012-10-24 NOTE — Progress Notes (Signed)
ANTICOAGULATION CONSULT NOTE - Follow Up Consult  Pharmacy Consult for Coumadin Indication: Atrial fibrillation   Allergies  Allergen Reactions  . Ambien [Zolpidem Tartrate] Other (See Comments)    Severe hallucinations and behavior changes reported by family  . Ciprofloxacin     PATIENT AT VERY HIGH RISK FOR C DIFFICILE COLITIS  . Lunesta [Eszopiclone] Other (See Comments)    Nightmares & hallucinations  . Metformin And Related Swelling  . Protonix [Pantoprazole Sodium] Other (See Comments)    Unknown reaction-"Doctor told me to never take it"    Patient Measurements: Height: 5\' 5"  (165.1 cm) Weight: 235 lb 7.2 oz (106.8 kg) IBW/kg (Calculated) : 57  Vital Signs: Temp: 97.4 F (36.3 C) (08/22 0800) Temp src: Oral (08/22 0800) BP: 112/76 mmHg (08/22 0800) Pulse Rate: 64 (08/22 0800)  Labs:  Recent Labs  10/22/12 0420 10/23/12 0440 10/24/12 0422 10/24/12 0530  HGB 9.0* 10.0* 8.3* 9.5*  HCT 30.1* 33.7* 27.2* 31.6*  PLT 413* 481* 276 403*  LABPROT 25.7* 22.8* 23.1* 23.1*  INR 2.44* 2.09* 2.12* 2.12*  CREATININE 0.46* 0.49*  --  0.48*    Estimated Creatinine Clearance: 93.1 ml/min (by C-G formula based on Cr of 0.48).   Assessment: Patient has new-onset atrial fibrillation and received a pacemaker on 8/14. Coumadin was started on 8/14 pre-pacer. INR 2.12 after Coumadin 4 mg yesterday. No bleeding noted.    Goal of Therapy:  INR 2-3 Monitor platelets by anticoagulation protocol: Yes   Plan:  Coumadin 3 mg today Daily PT/INR  Tomie China, PharmD Candidate 10/24/2012,9:11 AM

## 2012-10-24 NOTE — Progress Notes (Signed)
Patient on and off BiPAP all night per RN. Patient wore for total about 3-4 hours in 1 hour intervals. Not tolerating it well at all. Will continue to monitor.

## 2012-10-25 ENCOUNTER — Inpatient Hospital Stay (HOSPITAL_COMMUNITY): Payer: Medicare HMO

## 2012-10-25 LAB — BASIC METABOLIC PANEL
BUN: 8 mg/dL (ref 6–23)
CO2: 41 mEq/L (ref 19–32)
Calcium: 8.5 mg/dL (ref 8.4–10.5)
Glucose, Bld: 123 mg/dL — ABNORMAL HIGH (ref 70–99)
Sodium: 140 mEq/L (ref 135–145)

## 2012-10-25 LAB — GLUCOSE, CAPILLARY
Glucose-Capillary: 123 mg/dL — ABNORMAL HIGH (ref 70–99)
Glucose-Capillary: 174 mg/dL — ABNORMAL HIGH (ref 70–99)

## 2012-10-25 MED ORDER — ROPINIROLE HCL 1 MG PO TABS
1.0000 mg | ORAL_TABLET | Freq: Every day | ORAL | Status: DC
  Administered 2012-10-25 – 2012-10-29 (×5): 1 mg via ORAL
  Filled 2012-10-25 (×7): qty 1

## 2012-10-25 MED ORDER — WARFARIN SODIUM 5 MG PO TABS
5.0000 mg | ORAL_TABLET | Freq: Once | ORAL | Status: AC
  Administered 2012-10-25: 5 mg via ORAL
  Filled 2012-10-25: qty 1

## 2012-10-25 NOTE — Progress Notes (Signed)
ANTICOAGULATION CONSULT NOTE - Follow Up Consult  Pharmacy Consult for Warfarin Indication: Atrial Fibrillation  Allergies  Allergen Reactions  . Ambien [Zolpidem Tartrate] Other (See Comments)    Severe hallucinations and behavior changes reported by family  . Ciprofloxacin     PATIENT AT VERY HIGH RISK FOR C DIFFICILE COLITIS  . Lunesta [Eszopiclone] Other (See Comments)    Nightmares & hallucinations  . Metformin And Related Swelling  . Protonix [Pantoprazole Sodium] Other (See Comments)    Unknown reaction-"Doctor told me to never take it"    Patient Measurements: Height: 5\' 5"  (165.1 cm) Weight: 230 lb 6.1 oz (104.5 kg) IBW/kg (Calculated) : 57  Vital Signs: Temp: 97.6 F (36.4 C) (08/23 0738) Temp src: Oral (08/23 0738) BP: 112/59 mmHg (08/23 1000) Pulse Rate: 78 (08/23 1000)  Labs:  Recent Labs  10/23/12 0440 10/24/12 0422 10/24/12 0530 10/25/12 0435  HGB 10.0* 8.3* 9.5*  --   HCT 33.7* 27.2* 31.6*  --   PLT 481* 276 403*  --   LABPROT 22.8* 23.1* 23.1* 20.9*  INR 2.09* 2.12* 2.12* 1.86*  CREATININE 0.49*  --  0.48* 0.52    Estimated Creatinine Clearance: 92 ml/min (by C-G formula based on Cr of 0.52).   Assessment: 57 y.o. F who continues on warfarin for new-onset Afib noted this admit. The patient is s/p pacemaker placement on 8/14. INR this morning is slightly SUBtherapeutic (INR 1.86 << 2.12, goal of 2-3). No CBC today, no s/sx of bleeding noted.   Goal of Therapy:  INR 2-3 Monitor platelets by anticoagulation protocol: Yes   Plan:  1. Warfarin 5 mg x 1 dose at 1800 today 2. Will continue to monitor for any signs/symptoms of bleeding and will follow up with PT/INR in the a.m.    Georgina Pillion, PharmD, BCPS Clinical Pharmacist Pager: [phone] 10/25/2012 12:47 PM

## 2012-10-25 NOTE — Progress Notes (Signed)
Placed pt. On bipap. Pt. Is tolerating well at this time. RT to monitor. 

## 2012-10-25 NOTE — Progress Notes (Signed)
PULMONARY  / CRITICAL CARE MEDICINE  Name: [name] MRN: [id number] DOB: Nov 18, 1955    ADMISSION DATE:  10/09/2012 CONSULTATION DATE:  10/09/2012  REFERRING MD :  Vivia Ewing MD PRIMARY SERVICE: PCCM  CHIEF COMPLAINT:  Diarrhea  BRIEF PATIENT DESCRIPTION: 57 y/o female with a history of recurrent C.diff returned to the [facility] ED on 8/7 with confusion, diarrhea, subjective fevers, chills, and abdominal pain.  Found to be hypotensive with AKI.  BP initially improved with IVF in ED, but then shock. Admitted to 2100.  Transferred out of ICU and to [facility] 8/14. Transferred back to ICU 8/17 with hypercarbic resp failure. Notably, had received multiple doses of opioid analgesics and had been refusing nocturnal CPAP for 2-3 days prior to transfer back to ICU    LINES / TUBES: R IJ CVL 8/7 >> 8/19 PICC 8/19 >>   CULTURES: Blood 8/7 >> NEG Urine 8/7 >> NEG C diff 8/8 >> NEG  ANTIBIOTICS: flagyl 8/7 >> 8/11  fidaxomicin 8/13 >>   SUBJECTIVE/OVERNIGHT:  Better , no overnight issues  VITAL SIGNS: Temp:  [97.4 F (36.3 C)-97.7 F (36.5 C)] 97.6 F (36.4 C) (08/23 0738) Pulse Rate:  [63-154] 79 (08/23 0800) Resp:  [13-24] 19 (08/23 0800) BP: (105-135)/(59-85) 115/66 mmHg (08/23 0800) SpO2:  [86 %-100 %] 98 % (08/23 0734) FiO2 (%):  [40 %] 40 % (08/23 0144) Weight:  [104.5 kg (230 lb 6.1 oz)] 104.5 kg (230 lb 6.1 oz) (08/23 0439)    VENTILATOR SETTINGS: Vent Mode:  [-] BIPAP FiO2 (%):  [40 %] 40 % Set Rate:  [15 bmp] 15 bmp PEEP:  [8 cmH20] 8 cmH20 Pressure Support:  [16 cmH20] 16 cmH20 INTAKE / OUTPUT: Intake/Output     08/22 0701 - 08/23 0700 08/23 0701 - 08/24 0700   P.O. 840    I.V. (mL/kg)     Total Intake(mL/kg) 840 (8)    Urine (mL/kg/hr) 2945 (1.2) 225 (1.1)   Stool 7 (0) 1 (0)   Total Output 2952 226   Net -2112 -226          PHYSICAL EXAMINATION: Gen: RASS 0. + F/C. No distress HEENT: WNL PULM: improved BS diminished in RLL CV: Tachy, irreg, no M noted AB:  obese, soft, NT, NABS Ext: BUE, BLE edema improving Neuro: no focal deficits  LABS:  CBC  Recent Labs Lab 10/23/12 0440 10/24/12 0422 10/24/12 0530  HGB 10.0* 8.3* 9.5*  HCT 33.7* 27.2* 31.6*  WBC 7.9 9.8 5.8  PLT 481* 276 403*    Coag's Recent Labs     10/24/12  0422  10/24/12  0530  10/25/12  0435  INR  2.12*  2.12*  1.86*    BMET  Recent Labs Lab 10/21/12 0430 10/22/12 0420 10/23/12 0440 10/24/12 0530 10/25/12 0435  NA 146* 144 141 139 140  K 3.4* 3.4* 4.3 3.9 3.8  CL 102 98 93* 94* 96  CO2 37* 43* >45* 42* 41*  GLUCOSE 144* 141* 191* 168* 123*  BUN 11 7 6 7 8   CREATININE 0.52 0.46* 0.49* 0.48* 0.52  CALCIUM 8.5 8.2* 8.7 8.6 8.5    Electrolytes Recent Labs     10/23/12  0440  10/24/12  0530  10/25/12  0435  CALCIUM  8.7  8.6  8.5   Sepsis Markers No results found for this basename: LACTICACIDVEN, PROCALCITON, O2SATVEN,  in the last 72 hours  ABG  Recent Labs Lab 10/19/12 1409  PHART 7.167*  PCO2ART 89.1*  PO2ART  102.0*  HCO3 31.0*  TCO2 33.7  O2SAT 95.6   Liver Enzymes  Recent Labs Lab 10/22/12 0420 10/23/12 0440 10/24/12 0422 10/24/12 0530 10/25/12 0435  INR 2.44* 2.09* 2.12* 2.12* 1.86*    Cardiac Enzymes No results found for this basename: TROPONINI, PROBNP,  in the last 72 hours Glucose Recent Labs     10/24/12  0758  10/24/12  1145  10/24/12  1618  10/24/12  2111  10/25/12  0034  10/25/12  0714  GLUCAP  165*  165*  277*  226*  157*  123*    CXR: NSC R effusion, CM, vasc congestion  ASSESSMENT / PLAN:   PULMONARY A: COPD OHS/OSA R pleural effusion Mild pulmonary edema Acute hypercarbic resp failure due to opiates - resolved 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 - placed because  of prolonged pauses P:  Changed to diltiazem CD formulation 8/21 Cont warfarin per pharmacy  RENAL A:  AKI, resolved  Anasarca, severe hypervolemia - improving Hypernatremia, improving Hypokalemia Met alk due to loop diuretics P: Monitor BMET intermittently Correct electrolytes as indicated Hold further furosemide 8/23 as appears to be auto-diuresing  GASTROINTESTINAL A: Obesity chronic colitis P:   Cont diet (begun 8/19)  HEMATOLOGIC A:  Mild anemia, no evidence of acute blood loss P:  -follow CBC intermittently  INFECTIOUS A:    Hx Recurrent C.diff P:   Cont dificid and enteral vanc as ordered  ENDOCRINE A:  DM2 P:   Cont SSI - changed to ACHS 8/19  NEUROLOGIC A:  Acute enceph due to hypercarbia - resolved Chronic pain syndrome Anxiety D/O P:   minimize sedating meds  Tramadol for pain Add requip for restless leg Prn xanax, takes at home   Tfr to floor  Dorcas Carrow Beeper  [phone]  Cell  [phone]  If no response or cell goes to voicemail, call beeper [phone]

## 2012-10-26 LAB — CBC
HCT: 29.2 % — ABNORMAL LOW (ref 36.0–46.0)
MCH: 27.9 pg (ref 26.0–34.0)
MCHC: 30.5 g/dL (ref 30.0–36.0)
MCV: 91.5 fL (ref 78.0–100.0)
RDW: 20.7 % — ABNORMAL HIGH (ref 11.5–15.5)

## 2012-10-26 LAB — BASIC METABOLIC PANEL
BUN: 9 mg/dL (ref 6–23)
Chloride: 95 mEq/L — ABNORMAL LOW (ref 96–112)
Creatinine, Ser: 0.53 mg/dL (ref 0.50–1.10)
GFR calc Af Amer: >90 mL/min (ref >90)

## 2012-10-26 LAB — PROTIME-INR: Prothrombin Time: 15.4 seconds — ABNORMAL HIGH (ref 11.6–15.2)

## 2012-10-26 LAB — GLUCOSE, CAPILLARY
Glucose-Capillary: 160 mg/dL — ABNORMAL HIGH (ref 70–99)
Glucose-Capillary: 200 mg/dL — ABNORMAL HIGH (ref 70–99)

## 2012-10-26 MED ORDER — WARFARIN SODIUM 6 MG PO TABS
6.0000 mg | ORAL_TABLET | Freq: Once | ORAL | Status: AC
  Administered 2012-10-26: 6 mg via ORAL
  Filled 2012-10-26: qty 1

## 2012-10-26 MED ORDER — FAMOTIDINE 20 MG PO TABS
20.0000 mg | ORAL_TABLET | Freq: Two times a day (BID) | ORAL | Status: DC
  Administered 2012-10-26 – 2012-10-30 (×9): 20 mg via ORAL
  Filled 2012-10-26 (×10): qty 1

## 2012-10-26 MED ORDER — SALINE SPRAY 0.65 % NA SOLN
1.0000 | NASAL | Status: DC | PRN
  Filled 2012-10-26: qty 44

## 2012-10-26 NOTE — Progress Notes (Signed)
Pt. Tolerated bipap for less than an hour. RN stated that pt. Became really uncomfortable with the mask. Pt. Is tolerating nasal cannula for now.

## 2012-10-26 NOTE — Progress Notes (Signed)
PULMONARY  / CRITICAL CARE MEDICINE  Name: [name] MRN: [id number] DOB: Oct 31, 1955    ADMISSION DATE:  10/09/2012 CONSULTATION DATE:  10/09/2012  REFERRING MD :  Vivia Ewing MD PRIMARY SERVICE: PCCM  CHIEF COMPLAINT:  Diarrhea  BRIEF PATIENT DESCRIPTION: 6 yowf former smoker  with a history of recurrent C.diff returned to the [facility] ED on 8/7 with confusion, diarrhea, subjective fevers, chills, and abdominal pain.  Found to be hypotensive with AKI.  BP initially improved with IVF in ED, but then shock. Admitted to 2100.  Transferred out of ICU and to [facility] 8/14. Transferred back to ICU 8/17 with hypercarbic resp failure. Notably, had received multiple doses of opioid analgesics and had been refusing nocturnal CPAP for 2-3 days prior to transfer back to ICU    LINES / TUBES: R IJ CVL 8/7 >> 8/19 PICC 8/19 >>   CULTURES: Blood 8/7 >> Neg Urine 8/7 >> Neg C diff 8/8 >> Neg  ANTIBIOTICS: flagyl 8/7 >> 8/11  fidaxomicin 8/13 >>   SUBJECTIVE/OVERNIGHT:  C/o sob at rest when not on bipap  VITAL SIGNS: Temp:  [97.8 F (36.6 C)-98.2 F (36.8 C)] 97.8 F (36.6 C) (08/24 0700) Pulse Rate:  [72-79] 75 (08/24 0715) Resp:  [11-25] 16 (08/24 0715) BP: (103-131)/(54-79) 116/59 mmHg (08/24 0715) SpO2:  [90 %-100 %] 93 % (08/24 0839) Weight:  [225 lb 5 oz (102.2 kg)] 225 lb 5 oz (102.2 kg) (08/24 0500) 02 rx  3lpm NP    VENTILATOR SETTINGS:   INTAKE / OUTPUT: Intake/Output     08/23 0701 - 08/24 0700 08/24 0701 - 08/25 0700   P.O. 960    Total Intake(mL/kg) 960 (9.4)    Urine (mL/kg/hr) 3050 (1.2) 250 (0.8)   Stool 4 (0)    Total Output 3054 250   Net -2094 -250          PHYSICAL EXAMINATION: Gen: nad flat despite c/o sob, no hoarseness HEENT: suprasternal notch retraction with insp PULM: distant bs, no wheeze CV: Tachy, irreg, no M noted AB: obese, soft, NT, NABS Ext: BUE, BLE edema improving Neuro: no focal deficits  LABS:  CBC  Recent Labs Lab 10/24/12 0422  10/24/12 0530 10/26/12 0415  HGB 8.3* 9.5* 8.9*  HCT 27.2* 31.6* 29.2*  WBC 9.8 5.8 6.9  PLT 276 403* 278    Coag's Recent Labs     10/24/12  0530  10/25/12  0435  10/26/12  0415  INR  2.12*  1.86*  1.25    BMET  Recent Labs Lab 10/22/12 0420 10/23/12 0440 10/24/12 0530 10/25/12 0435 10/26/12 0415  NA 144 141 139 140 137  K 3.4* 4.3 3.9 3.8 4.2  CL 98 93* 94* 96 95*  CO2 43* >45* 42* 41* 39*  GLUCOSE 141* 191* 168* 123* 171*  BUN 7 6 7 8 9   CREATININE 0.46* 0.49* 0.48* 0.52 0.53  CALCIUM 8.2* 8.7 8.6 8.5 8.6    Electrolytes Recent Labs     10/24/12  0530  10/25/12  0435  10/26/12  0415  CALCIUM  8.6  8.5  8.6   Sepsis Markers No results found for this basename: LACTICACIDVEN, PROCALCITON, O2SATVEN,  in the last 72 hours  ABG  Recent Labs Lab 10/19/12 1409  PHART 7.167*  PCO2ART 89.1*  PO2ART 102.0*  HCO3 31.0*  TCO2 33.7  O2SAT 95.6   Liver Enzymes  Recent Labs Lab 10/23/12 0440 10/24/12 0422 10/24/12 0530 10/25/12 0435 10/26/12 0415  INR 2.09* 2.12*  2.12* 1.86* 1.25    Cardiac Enzymes No results found for this basename: TROPONINI, PROBNP,  in the last 72 hours Glucose Recent Labs     10/25/12  0034  10/25/12  0714  10/25/12  1159  10/25/12  1541  10/25/12  1546  10/25/12  2132  GLUCAP  157*  123*  174*  203*  148*  173*    CXR:  8/23 reviewed Left subclavian pacemaker and right arm PICC stable in  position. Probable layering right pleural effusion with right  lower lung atelectasis/consolidation. The left infrahilar  consolidation / atelectasis is stable. 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 Subglottic obst clinically 8/24 P:   Cont BiPAP PRN Cont PRN BDs Need to avoid ALL opioids!!  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 - placed because of prolonged pauses P:  Changed to diltiazem CD formulation 8/21 Cont warfarin per pharmacy  RENAL  Intake/Output Summary (Last 24 hours) at 10/26/12 1023 Last data filed at 10/26/12 0900  Gross per 24 hour  Intake    600 ml  Output   2577 ml  Net  -1977 ml    A:  AKI, resolved  Anasarca, severe hypervolemia - improving Hypernatremia,resolved 8/21 Hypokalemia, resolved 8/21 Met alk due to loop diuretics/ hypercarbia   P: Monitor BMET intermittently Correct electrolytes as indicated Hold further furosemide 8/23 as appears to be auto-diuresing  GASTROINTESTINAL A: Obesity chronic colitis P:   Cont diet (begun 8/19)  HEMATOLOGIC  Recent Labs Lab 10/24/12 0422 10/24/12 0530 10/26/12 0415  HGB 8.3* 9.5* 8.9*    A:  Mild anemia, no evidence of acute blood loss P:  -follow CBC intermittently  INFECTIOUS A:    Hx Recurrent C.diff P:   Cont dificid and enteral vanc as ordered  ENDOCRINE A:  DM2 P:   Cont SSI - changed to ACHS 8/19  NEUROLOGIC A:  Acute enceph due to hypercarbia - resolved Chronic pain syndrome Anxiety D/O P:   minimize sedating meds  Tramadol for pain Add requip for restless leg Prn xanax, takes at home     Will keep in step down status until sort out ? Of upper airway obst Added h2 back 8/24     Sandrea Hughs, MD Pulmonary and Critical Care Medicine [facility] Cell [phone] After 5:30 PM or weekends, call [phone]

## 2012-10-26 NOTE — Progress Notes (Signed)
ANTICOAGULATION CONSULT NOTE - Follow Up Consult  Pharmacy Consult for Coumadin Indication: atrial fibrillation  Patient Measurements: Height: 5\' 5"  (165.1 cm) Weight: 225 lb 5 oz (102.2 kg) IBW/kg (Calculated) : 57 Vital Signs: Temp: 97.8 F (36.6 C) (08/24 0700) Temp src: Oral (08/24 0700) BP: 116/66 mmHg (08/24 0357) Pulse Rate: 75 (08/24 0357) Labs:  Recent Labs  10/24/12 0422 10/24/12 0530 10/25/12 0435 10/26/12 0415  HGB 8.3* 9.5*  --  8.9*  HCT 27.2* 31.6*  --  29.2*  PLT 276 403*  --  278  LABPROT 23.1* 23.1* 20.9* 15.4*  INR 2.12* 2.12* 1.86* 1.25  CREATININE  --  0.48* 0.52 0.53   Estimated Creatinine Clearance: 90.9 ml/min (by C-G formula based on Cr of 0.53).  Assessment: 57 YOF on Coumadin for new onset Afib. Pacemaker placed on 8/14. INR is subtherapeutic this morning- decreased despite increased doses. CBC is trending down this am. No bleeding noted.   Goal of Therapy:  INR 2-3   Plan:  1. Warfarin 6mg  po x1 tonight.  2. Will continue to monitor for any signs/symptoms of bleeding and will follow up with PT/INR in the a.m.   Link Snuffer, PharmD, BCPS Clinical Pharmacist [phone] 10/26/2012,8:22 AM

## 2012-10-27 LAB — GLUCOSE, CAPILLARY: Glucose-Capillary: 204 mg/dL — ABNORMAL HIGH (ref 70–99)

## 2012-10-27 LAB — BASIC METABOLIC PANEL
GFR calc Af Amer: >90 mL/min (ref >90)
GFR calc non Af Amer: >90 mL/min (ref >90)
Potassium: 4.5 mEq/L (ref 3.5–5.1)
Sodium: 137 mEq/L (ref 135–145)

## 2012-10-27 LAB — CBC
MCHC: 29.8 g/dL — ABNORMAL LOW (ref 30.0–36.0)
Platelets: 272 10*3/uL (ref 150–400)
RDW: 21 % — ABNORMAL HIGH (ref 11.5–15.5)
WBC: 8.3 10*3/uL (ref 4.0–10.5)

## 2012-10-27 LAB — TSH: TSH: 2.585 u[IU]/mL (ref 0.350–4.500)

## 2012-10-27 LAB — PROTIME-INR
INR: 1.41 (ref 0.00–1.49)
Prothrombin Time: 16.9 seconds — ABNORMAL HIGH (ref 11.6–15.2)

## 2012-10-27 LAB — SEDIMENTATION RATE: Sed Rate: 40 mm/hr — ABNORMAL HIGH (ref 0–22)

## 2012-10-27 MED ORDER — METFORMIN HCL 500 MG PO TABS
500.0000 mg | ORAL_TABLET | Freq: Two times a day (BID) | ORAL | Status: DC
  Administered 2012-10-27 – 2012-10-30 (×7): 500 mg via ORAL
  Filled 2012-10-27 (×8): qty 1

## 2012-10-27 MED ORDER — WARFARIN SODIUM 5 MG PO TABS
5.0000 mg | ORAL_TABLET | Freq: Once | ORAL | Status: AC
  Administered 2012-10-27: 5 mg via ORAL
  Filled 2012-10-27: qty 1

## 2012-10-27 NOTE — Progress Notes (Addendum)
PULMONARY  / CRITICAL CARE MEDICINE  Name: [name] MRN: [id number] DOB: 1955-06-05    ADMISSION DATE:  10/09/2012 CONSULTATION DATE:  10/09/2012  REFERRING MD :  Vivia Ewing MD PRIMARY SERVICE: PCCM  CHIEF COMPLAINT:  Diarrhea  BRIEF PATIENT DESCRIPTION: 53 yowf former smoker  with a history of recurrent C.diff returned to the [facility] ED on 8/7 with confusion, diarrhea, subjective fevers, chills, and abdominal pain.  Found to be hypotensive with AKI.  BP initially improved with IVF in ED, but then shock. Admitted to 2100.  Transferred out of ICU and to [facility] 8/14. Transferred back to ICU 8/17 with hypercarbic resp failure. Notably, had received multiple doses of opioid analgesics and had been refusing nocturnal CPAP for 2-3 days prior to transfer back to ICU    LINES / TUBES: R IJ CVL 8/7 >> 8/19 PICC 8/19 >>   CULTURES: Blood 8/7 >> Neg Urine 8/7 >> Neg C diff 8/8 >> Neg  ANTIBIOTICS: flagyl 8/7 >> 8/11  fidaxomicin 8/13 >> 8/25 Enteral vanc (taper) 8/25 >>   SUBJECTIVE/OVERNIGHT:  Much improved. No distress  VITAL SIGNS: Temp:  [97.3 F (36.3 C)-97.9 F (36.6 C)] 97.9 F (36.6 C) (08/25 1357) Pulse Rate:  [71-94] 71 (08/25 0732) Resp:  [18-36] 21 (08/25 0732) BP: (120-136)/(61-77) 123/72 mmHg (08/25 0729) SpO2:  [89 %-97 %] 93 % (08/25 1539) Weight:  [100.7 kg (222 lb 0.1 oz)] 100.7 kg (222 lb 0.1 oz) (08/25 0548) 02 rx  3lpm NP    VENTILATOR SETTINGS:   INTAKE / OUTPUT: Intake/Output     08/24 0701 - 08/25 0700 08/25 0701 - 08/26 0700   P.O. 1320    Total Intake(mL/kg) 1320 (13.1)    Urine (mL/kg/hr) 3375 (1.4) 975 (1.1)   Stool     Total Output 3375 975   Net -2055 -975          PHYSICAL EXAMINATION: Gen: nad flat despite c/o sob, no hoarseness HEENT: WNL PULM: distant bs, no wheeze CV: Tachy, irreg, no M noted AB: obese, soft, NT, NABS Ext: BUE, BLE edema improving Neuro: no focal deficits  LABS:  CBC  Recent Labs Lab 10/24/12 0530  10/26/12 0415 10/27/12 0440  HGB 9.5* 8.9* 8.7*  HCT 31.6* 29.2* 29.2*  WBC 5.8 6.9 8.3  PLT 403* 278 272    Coag's Recent Labs     10/25/12  0435  10/26/12  0415  10/27/12  0440  INR  1.86*  1.25  1.41    BMET  Recent Labs Lab 10/23/12 0440 10/24/12 0530 10/25/12 0435 10/26/12 0415 10/27/12 0440  NA 141 139 140 137 137  K 4.3 3.9 3.8 4.2 4.5  CL 93* 94* 96 95* 94*  CO2 >45* 42* 41* 39* 39*  GLUCOSE 191* 168* 123* 171* 155*  BUN 6 7 8 9 10   CREATININE 0.49* 0.48* 0.52 0.53 0.58  CALCIUM 8.7 8.6 8.5 8.6 8.9    Electrolytes Recent Labs     10/25/12  0435  10/26/12  0415  10/27/12  0440  CALCIUM  8.5  8.6  8.9   Sepsis Markers No results found for this basename: LACTICACIDVEN, PROCALCITON, O2SATVEN,  in the last 72 hours  ABG No results found for this basename: PHART, PCO2, PCO2ART, PO2, PO2ART, HCO3, TCO2, O2SAT,  in the last 168 hours Liver Enzymes  Recent Labs Lab 10/24/12 0422 10/24/12 0530 10/25/12 0435 10/26/12 0415 10/27/12 0440  INR 2.12* 2.12* 1.86* 1.25 1.41    Cardiac Enzymes No  results found for this basename: TROPONINI, PROBNP,  in the last 72 hours Glucose Recent Labs     10/26/12  0843  10/26/12  1213  10/26/12  1724  10/26/12  2220  10/27/12  0734  10/27/12  1233  GLUCAP  153*  160*  262*  200*  157*  169*    CXR: NNF    ASSESSMENT / PLAN:   PULMONARY A: COPD OHS/OSA R pleural effusion Mild pulmonary edema Acute hypercarbic resp failure due to opiates - resolved 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 - placed because of prolonged pauses P:  Cont diltiazem (dose increased 8/22) Cont digoxin (initiated 8/22) Cont warfarin per pharmacy  RENAL  A:  AKI, resolved  Anasarca, severe  hypervolemia, resolving - autodiuresing Hypernatremia,resolved 8/21 Hypokalemia, resolved 8/21 Met alk due to loop diuretics/ chronic hypercarbia P: Monitor BMET intermittently Correct electrolytes as indicated   GASTROINTESTINAL A: Obesity chronic colitis P:   Cont CHO mod diet (begun 8/19)  HEMATOLOGIC  Recent Labs Lab 10/24/12 0530 10/26/12 0415 10/27/12 0440  HGB 9.5* 8.9* 8.7*    A:  Mild anemia, no evidence of acute blood loss P:  -follow CBC intermittently  INFECTIOUS A:    Hx Recurrent C.diff P:   Cont enteral vanc as ordered  ENDOCRINE A:  DM2 P:   Cont ACHS SSI   NEUROLOGIC A:  Acute enceph due to hypercarbia - resolved Chronic pain syndrome Anxiety D/O P:   minimize sedating meds  Tramadol for pain Add requip for restless leg Cont PRN xanax, takes at home    Has been very tenuous this hospitalization. Will watch in ICU today. Possible med-surg 8/26   Billy Fischer, MD ; [facility] service Mobile [phone].  After 5:30 PM or weekends, call [phone]

## 2012-10-27 NOTE — Progress Notes (Signed)
OOB to bedside commode  DOE and heart rate up to 140's. States, "I have no reserve, I don't want cath out, I cannot make it".  Back to bed still with shortness of breath and hr 112.  Foley cath in place refuse to have dc'd.

## 2012-10-27 NOTE — Progress Notes (Signed)
ANTICOAGULATION CONSULT NOTE - Follow Up Consult  Pharmacy Consult for Coumadin Indication: atrial fibrillation  Patient Measurements: Height: 5\' 5"  (165.1 cm) Weight: 222 lb 0.1 oz (100.7 kg) IBW/kg (Calculated) : 57 Vital Signs: Temp: 97.9 F (36.6 C) (08/25 1357) Temp src: Oral (08/25 1357) BP: 123/72 mmHg (08/25 0729) Pulse Rate: 71 (08/25 0732) Labs:  Recent Labs  10/25/12 0435 10/26/12 0415 10/27/12 0440  HGB  --  8.9* 8.7*  HCT  --  29.2* 29.2*  PLT  --  278 272  LABPROT 20.9* 15.4* 16.9*  INR 1.86* 1.25 1.41  CREATININE 0.52 0.53 0.58   Estimated Creatinine Clearance: 90.2 ml/min (by C-G formula based on Cr of 0.58).  Assessment: 57 YOF on Coumadin for new onset Afib. Pacemaker placed on 8/14. INR 1.41 is subtherapeutic but trending up. CBC is low stable. No bleeding noted.   Goal of Therapy:  INR 2-3   Plan:  1. Warfarin 5mg  po x1 tonight.  2. Will continue to monitor for any signs/symptoms of bleeding and will follow up with PT/INR in the a.m.   Leota Sauers Pharm.D. CPP, BCPS Clinical Pharmacist [phone] 10/27/2012 3:21 PM

## 2012-10-27 NOTE — Progress Notes (Signed)
RT placed patient on bipap 16/6,40%. Patient is tolerating bipap well at this time. RT will continue to monitor.

## 2012-10-27 NOTE — Progress Notes (Signed)
Pt. Refused bipap for tonight. Pt. States she is just going to wear her nasal cannula.

## 2012-10-27 NOTE — Progress Notes (Signed)
Feels as if she can use bedpan.  Foley cath removed.  Patient without complaints

## 2012-10-28 DIAGNOSIS — R5381 Other malaise: Secondary | ICD-10-CM

## 2012-10-28 DIAGNOSIS — A0472 Enterocolitis due to Clostridium difficile, not specified as recurrent: Secondary | ICD-10-CM

## 2012-10-28 LAB — GLUCOSE, CAPILLARY
Glucose-Capillary: 139 mg/dL — ABNORMAL HIGH (ref 70–99)
Glucose-Capillary: 167 mg/dL — ABNORMAL HIGH (ref 70–99)
Glucose-Capillary: 167 mg/dL — ABNORMAL HIGH (ref 70–99)

## 2012-10-28 MED ORDER — WARFARIN SODIUM 6 MG PO TABS
6.0000 mg | ORAL_TABLET | Freq: Once | ORAL | Status: AC
  Administered 2012-10-28: 6 mg via ORAL
  Filled 2012-10-28: qty 1

## 2012-10-28 MED ORDER — ALPRAZOLAM ER 0.5 MG PO TB24
0.5000 mg | ORAL_TABLET | Freq: Every day | ORAL | Status: DC
  Administered 2012-10-28 – 2012-10-29 (×2): 0.5 mg via ORAL
  Filled 2012-10-28 (×2): qty 1

## 2012-10-28 MED ORDER — DILTIAZEM HCL ER COATED BEADS 240 MG PO CP24
240.0000 mg | ORAL_CAPSULE | Freq: Two times a day (BID) | ORAL | Status: DC
  Administered 2012-10-28 – 2012-10-30 (×4): 240 mg via ORAL
  Filled 2012-10-28 (×6): qty 1

## 2012-10-28 NOTE — Consult Note (Signed)
Physical Medicine and Rehabilitation Consult  Reason for Consult: Deconditioning Referring Physician:  Dr. Sung Amabile.   HPI: [name] is a 57 y.o. female with history of COPD-oxygen dependent, H1N1 with ARDS complicated by PNA/trach and prolonged hospitalization Dec 2013 to early spring. She has problems with diarrhea with recurrent c-diff colitis and multiple hospitalizations since 09/09/12. She was readmitted on 10/09/12 with diarrhea, fever, confusion, hypotension with AKI due to recurrent C-diff and hypovolemic shock. Patient had been unable to fill her Vanc/flagyl on recent discharge and vancomycin pulse recommended by ID. Patient with A Fib/ A flutter treated with BB but developed bradycardic arrest with CHB. Dr. Graciela Husbands consulted for input and recommended coumadin and DDD pacemaker placed on 10/16/12.  Patient with hypercarbic respiratory failure likely  due to multiple opioid analgesics and refusal to wear CP and required transfer back to ICU on 10/19/12. Treated with BIPAP with improvement and therapies initiated. Patient deconditioned and CIR recommended by PT and MD.    Review of Systems  Respiratory: Negative for cough and shortness of breath.   Cardiovascular: Negative for chest pain and palpitations.  Gastrointestinal: Positive for abdominal pain and diarrhea (small amounts--getting firm). Negative for heartburn and nausea.  Musculoskeletal: Positive for myalgias and back pain.  Neurological: Positive for weakness. Negative for headaches.  Psychiatric/Behavioral: The patient does not have insomnia.    Past Medical History  Diagnosis Date  . COPD (chronic obstructive pulmonary disease)   . Chronic respiratory failure   . Morbid obesity   . Tobacco abuse   . GERD (gastroesophageal reflux disease)   . Hyperlipidemia   . Chronic pain   . Hypertension   . Coronary vasospasm     a. 03/2004 NSTEMI/Cath: EF 45%, LM nl, LAD nl, D1 nl, D2 nl, LCX nl, OM1 large-nl, RCA-spasm noted,  otw nl.  . Arthritis   . Allergic rhinitis   . Diabetes mellitus type 2 in obese 02/29/2012  . HTN (hypertension) 02/29/2012  . 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 recurrent in setting of abx noncompliance.  . Chronic kidney disease 09/2012    acute renal failure  . Shock 09/2012  . H1N1 influenza    Past Surgical History  Procedure Laterality Date  . Cholecystectomy    . Partial hysterectomy    . Angioplasty    . Umbilical hernia repair  12/2009   History reviewed. No pertinent family history.  Social History:  Married. Disabled due to pulmonary issues for past 2 years.  Husband can provide assist past discharge. She reports that she quit smoking about 7 months ago. Her smoking use included Cigarettes. She has a 80 pack-year smoking history. She has never used smokeless tobacco. She reports that she does not drink alcohol or use illicit drugs.   Allergies  Allergen Reactions  . Ambien [Zolpidem Tartrate] Other (See Comments)    Severe hallucinations and behavior changes reported by family  . Ciprofloxacin     PATIENT AT VERY HIGH RISK FOR C DIFFICILE COLITIS  . Lunesta [Eszopiclone] Other (See Comments)    Nightmares & hallucinations  . Metformin And Related Swelling    Doubt true allergy. Re-challenge 8/25 and monitor  . Protonix [Pantoprazole Sodium] Other (See Comments)    Unknown reaction-"Doctor told me to never take it"   Medications Prior to Admission  Medication Sig Dispense Refill  . aspirin 81 MG chewable tablet Chew 1 tablet (81 mg total) by mouth daily.      . budesonide (PULMICORT) 0.25  MG/2ML nebulizer solution Take 2 mLs (0.25 mg total) by nebulization 2 (two) times daily.  60 mL  1  . diclofenac sodium (VOLTAREN) 1 % GEL Apply 4 g topically 2 (two) times daily as needed (for foot pain).       . famotidine (PEPCID) 20 MG tablet Take 1 tablet (20 mg total) by mouth at bedtime.  30 tablet  1  . furosemide (LASIX) 40 MG tablet Take 1 tablet (40 mg total) by mouth  2 (two) times daily.  60 tablet  1  . gabapentin (NEURONTIN) 100 MG capsule Take 1 capsule (100 mg total) by mouth 3 (three) times daily.  90 capsule  0  . glimepiride (AMARYL) 2 MG tablet Take 2 mg by mouth 2 (two) times daily with a meal.      . HYDROcodone-acetaminophen (NORCO) 7.5-325 MG per tablet Take 1 tablet by mouth every 4 (four) hours as needed for pain.      Marland Kitchen insulin aspart (NOVOLOG) 100 UNIT/ML injection Inject 0-8 Units into the skin 3 (three) times daily with meals. On sliding scale:  0-100 0 units 101-150 take 3 units 151-200 take 5 units 201>300 take 8 units 400 or higher call MD      . insulin glargine (LANTUS) 100 UNIT/ML injection Inject 32 Units into the skin at bedtime.      Marland Kitchen ipratropium-albuterol (DUONEB) 0.5-2.5 (3) MG/3ML SOLN Take 3 mLs by nebulization 3 (three) times daily.      . isosorbide mononitrate (IMDUR) 15 mg TB24 Take 0.5 tablets (15 mg total) by mouth daily.  30 tablet  1  . metroNIDAZOLE (FLAGYL) 500 MG tablet Take 1 tablet (500 mg total) by mouth every 8 (eight) hours.  45 tablet  1  . rOPINIRole (REQUIP) 1 MG tablet Take 1 mg by mouth at bedtime.      . sertraline (ZOLOFT) 50 MG tablet Take 50 mg by mouth daily.      . vancomycin (VANCOCIN) 50 mg/mL oral solution 500 mg PO q6hrs for 1 week, then 250mg  PO q6hrs for 1 week, then 125mg  PO q6hrs for 1 week, then 125mg  PO q12hrs 1 week and stop. Dispense full supply.  150 mL  1  . acetaminophen (TYLENOL) 325 MG tablet Take 650 mg by mouth every 6 (six) hours as needed for pain.        Home: Home Living Family/patient expects to be discharged to:: Private residence Living Arrangements: Spouse/significant other Available Help at Discharge: Family;Available 24 hours/day Type of Home: Mobile home Home Access: Stairs to enter Entrance Stairs-Number of Steps: 1 Entrance Stairs-Rails: Left Home Layout: One level Home Equipment: Walker - 2 wheels;Bedside commode;Shower seat;Cane - single point  Functional  History: Prior Function Comments: husband does all of the cooking and cleaning Functional Status:  Mobility: Bed Mobility Bed Mobility: Supine to Sit;Sitting - Scoot to Edge of Bed;Sit to Supine Supine to Sit: 4: Min assist;HOB elevated;With rails Supine to Sit: Patient Percentage: 60% Sitting - Scoot to Edge of Bed: 4: Min assist Sit to Supine: 1: +2 Total assist Sit to Supine: Patient Percentage: 50% Transfers Transfers: Sit to Stand;Stand to Sit Sit to Stand: 4: Min assist;From bed;From chair/3-in-1 Sit to Stand: Patient Percentage: 80% Stand to Sit: 4: Min assist;To chair/3-in-1 Stand to Sit: Patient Percentage: 60% Stand Pivot Transfers: 1: +2 Total assist Stand Pivot Transfers: Patient Percentage: 80% Ambulation/Gait Ambulation/Gait Assistance: 1: +2 Total assist Ambulation/Gait: Patient Percentage: 80% Ambulation Distance (Feet): 20 Feet Assistive device: Rolling walker  Ambulation/Gait Assistance Details: (A) to maintain balance with cues for RW placement. Gait Pattern: Step-through pattern;Decreased stride length Gait velocity: decreased Stairs: No    ADL: ADL ADL Comments: Discussed need for pt to assist with ADL as she is able to do . Pt incontinent of BM during session  Cognition: Cognition Overall Cognitive Status: Within Functional Limits for tasks assessed Orientation Level: Oriented X4 Cognition Arousal/Alertness: Awake/alert Behavior During Therapy: WFL for tasks assessed/performed Overall Cognitive Status: Within Functional Limits for tasks assessed Area of Impairment: Orientation Orientation Level: Time Following Commands: Follows one step commands consistently  Blood pressure 118/70, pulse 74, temperature 98.5 F (36.9 C), temperature source Oral, resp. rate 15, height 5\' 5"  (1.651 m), weight 98.1 kg (216 lb 4.3 oz), SpO2 94.00%.   Physical Exam  Nursing note and vitals reviewed. Constitutional: She is oriented to person, place, and time. She  appears well-developed and well-nourished. Nasal cannula in place.  HENT:  Head: Normocephalic and atraumatic.  Eyes: Conjunctivae are normal. Pupils are equal, round, and reactive to light.  Neck: Normal range of motion. Neck supple.  Cardiovascular: Normal rate.   Pulmonary/Chest: Effort normal and breath sounds normal.  Abdominal: Soft. Bowel sounds are normal. She exhibits no distension. There is no tenderness.  Musculoskeletal: She exhibits edema (1+ BLE).  Neurological: She is alert and oriented to person, place, and time. She has normal reflexes. She displays normal reflexes. No cranial nerve deficit. She exhibits normal muscle tone.  No gross sensory deficits. UE motor 4-prox to 4+ distally. LE 2- with HF, 2+ KE, and 4/5 ADF and APF. Left shoulder inhibited by her pacer.  Skin: Skin is warm and dry.  Left chest wall incision clean and dry with steri strips in place.   Psychiatric: She has a normal mood and affect. Her behavior is normal. Judgment and thought content normal.    Results for orders placed during the hospital encounter of 10/09/12 (from the past 24 hour(s))  GLUCOSE, CAPILLARY     Status: Abnormal   Collection Time    10/27/12  3:59 PM      Result Value Range   Glucose-Capillary 204 (*) 70 - 99 mg/dL   Comment 1 Notify RN     Comment 2 Documented in Chart    GLUCOSE, CAPILLARY     Status: Abnormal   Collection Time    10/27/12  9:13 PM      Result Value Range   Glucose-Capillary 153 (*) 70 - 99 mg/dL  PROTIME-INR     Status: Abnormal   Collection Time    10/28/12  5:00 AM      Result Value Range   Prothrombin Time 17.4 (*) 11.6 - 15.2 seconds   INR 1.47  0.00 - 1.49  GLUCOSE, CAPILLARY     Status: Abnormal   Collection Time    10/28/12  7:11 AM      Result Value Range   Glucose-Capillary 139 (*) 70 - 99 mg/dL   Comment 1 Notify RN     Comment 2 Documented in Chart    GLUCOSE, CAPILLARY     Status: Abnormal   Collection Time    10/28/12 11:59 AM       Result Value Range   Glucose-Capillary 167 (*) 70 - 99 mg/dL   No results found.  Assessment/Plan: Diagnosis: deconditioning related multiple medical issues including C diff colitis, respiratory failure 1. Does the need for close, 24 hr/day medical supervision in concert with the patient's rehab needs  make it unreasonable for this patient to be served in a less intensive setting? Yes 2. Co-Morbidities requiring supervision/potential complications: copd, obesity, htn, afib/flutter 3. Due to bladder management, bowel management, safety, skin/wound care, disease management, medication administration, pain management and patient education, does the patient require 24 hr/day rehab nursing? Yes 4. Does the patient require coordinated care of a physician, rehab nurse, PT (1-2 hrs/day, 5 days/week) and OT (1-2 hrs/day, 5 days/week) to address physical and functional deficits in the context of the above medical diagnosis(es)? Yes Addressing deficits in the following areas: balance, endurance, locomotion, strength, transferring, bowel/bladder control, bathing, dressing, feeding, grooming, toileting and psychosocial support 5. Can the patient actively participate in an intensive therapy program of at least 3 hrs of therapy per day at least 5 days per week? Yes 6. The potential for patient to make measurable gains while on inpatient rehab is excellent 7. Anticipated functional outcomes upon discharge from inpatient rehab are supervision with PT, supervision with OT, n/a with SLP. 8. Estimated rehab length of stay to reach the above functional goals is: 10 days 9. Does the patient have adequate social supports to accommodate these discharge functional goals? Yes 10. Anticipated D/C setting: Home 11. Anticipated post D/C treatments: HH therapy 12. Overall Rehab/Functional Prognosis: excellent  RECOMMENDATIONS: This patient's condition is appropriate for continued rehabilitative care in the following setting:  CIR Patient has agreed to participate in recommended program. Yes Note that insurance prior authorization may be required for reimbursement for recommended care.  Comment: Rehab RN to follow up. Pt is highly motivated and would benefit from our program. Husband is available to provide assistance upon dc.   Ranelle Oyster, MD, [place] [facility]     10/28/2012

## 2012-10-28 NOTE — Progress Notes (Signed)
Utilization review completed.  

## 2012-10-28 NOTE — Progress Notes (Signed)
Patient does not want to go onto BiPAP tonight.  RT set up BiPAP at bedside.  Patient stated she would have RN notify RT if she changed her mind

## 2012-10-28 NOTE — Progress Notes (Signed)
ANTICOAGULATION CONSULT NOTE - Follow Up Consult  Pharmacy Consult for Coumadin Indication: atrial fibrillation  Patient Measurements: Height: 5\' 5"  (165.1 cm) Weight: 216 lb 4.3 oz (98.1 kg) IBW/kg (Calculated) : 57 Vital Signs: Temp: 98.5 F (36.9 C) (08/26 1200) Temp src: Oral (08/26 1200) BP: 118/70 mmHg (08/26 1200) Pulse Rate: 74 (08/26 1110) Labs:  Recent Labs  10/26/12 0415 10/27/12 0440 10/28/12 0500  HGB 8.9* 8.7*  --   HCT 29.2* 29.2*  --   PLT 278 272  --   LABPROT 15.4* 16.9* 17.4*  INR 1.25 1.41 1.47  CREATININE 0.53 0.58  --    Estimated Creatinine Clearance: 88.8 ml/min (by C-G formula based on Cr of 0.58).  Assessment: 57 YOF on Coumadin for new onset Afib. Pacemaker placed on 8/14. INR 1.47 is subtherapeutic. No new cbc today, no bleeding noted.   Goal of Therapy:  INR 2-3   Plan:  1. Warfarin 6mg  po x1 tonight.  2. Will continue to monitor for any signs/symptoms of bleeding and will follow up with PT/INR in the a.m.   Bayard Hugger, PharmD, BCPS  Clinical Pharmacist  Pager: [phone]   10/28/2012 2:26 PM

## 2012-10-28 NOTE — Progress Notes (Signed)
SUBJECTIVE: The patient feels as though she is making good progress.  She feels most limited at this point by her shortness of breath which is worse with RVR.  She was admitted 10-09-12 with recurrent C diff and was found to be hypotensive with AKI.  She was also tachycardic and was found to be in atrial fibrillation and flutter.  On 8-12, she had 50 seconds of ventricular standstill and subsequently underwent pacemaker implantation.  Her rate control has been increased to Diltiazem 300mg  daily and she is also on Digoxin 0.25mg  daily.  Her rates are mostly well controlled, but increase into the 130's with activity.     At this time, she denies chest pain, shortness of breath, or any new concerns.  CURRENT MEDICATIONS: . ALPRAZolam  0.5 mg Oral QHS  . budesonide  0.25 mg Nebulization Q6H  . digoxin  0.25 mg Oral Daily  . diltiazem  300 mg Oral Daily  . famotidine  20 mg Oral BID  . feeding supplement  237 mL Oral BID BM  . feeding supplement  1 Container Oral Q24H  . insulin aspart  0-15 Units Subcutaneous TID WC  . insulin aspart  0-5 Units Subcutaneous QHS  . insulin aspart  3 Units Subcutaneous TID WC  . ipratropium  0.5 mg Nebulization Q6H  . levalbuterol  0.63 mg Nebulization Q6H  . metFORMIN  500 mg Oral BID WC  . rOPINIRole  1 mg Oral QHS  . sertraline  50 mg Oral Daily  . sodium chloride  10-40 mL Intracatheter Q12H  . vancomycin  125 mg Oral QID   Followed by  . [START ON 11/03/2012] vancomycin  125 mg Oral BID   Followed by  . [START ON 11/10/2012] vancomycin  125 mg Oral Daily   Followed by  . [START ON 11/17/2012] vancomycin  125 mg Oral QODAY   Followed by  . [START ON 11/25/2012] vancomycin  125 mg Oral Q3 days  . Warfarin - Pharmacist Dosing Inpatient   Does not apply q1800      OBJECTIVE: Physical Exam: Filed Vitals:   10/28/12 0800 10/28/12 0812 10/28/12 1109 10/28/12 1110  BP: 128/65  128/65   Pulse:    74  Temp: 98.3 F (36.8 C)     TempSrc: Oral     Resp:       Height:      Weight:      SpO2:  94%      Intake/Output Summary (Last 24 hours) at 10/28/12 1131 Last data filed at 10/28/12 1111  Gross per 24 hour  Intake   1410 ml  Output   1650 ml  Net   -240 ml    Telemetry reveals atrial flutter with RVR with activity  GEN- The patient is well appearing, alert and oriented x 3 today.   Head- normocephalic, atraumatic Eyes-  Sclera clear, conjunctiva pink Ears- hearing intact Oropharynx- clear Neck- supple,  Lungs- Clear to ausculation bilaterally, normal work of breathing Heart- irregular rate and rhythm  GI- soft, NT, ND, + BS Extremities- no clubbing, cyanosis, or edema Skin- pacemaker pocket is without hematoma Neuro- strength and sensation are intact  LABS: Basic Metabolic Panel:  Recent Labs  16/10/96 0415 10/27/12 0440  NA 137 137  K 4.2 4.5  CL 95* 94*  CO2 39* 39*  GLUCOSE 171* 155*  BUN 9 10  CREATININE 0.53 0.58  CALCIUM 8.6 8.9   CBC:  Recent Labs  10/26/12 0415 10/27/12 0440  WBC 6.9 8.3  HGB 8.9* 8.7*  HCT 29.2* 29.2*  MCV 91.5 93.3  PLT 278 272   Thyroid Function Tests:  Recent Labs  10/27/12 0440  TSH 2.585   RADIOLOGY: Dg Chest Port 1 View 10/25/2012   *RADIOLOGY REPORT*  Clinical Data: Respiratory failure  PORTABLE CHEST - 1 VIEW  Comparison: 10/23/2012  Findings: Left subclavian pacemaker and right arm PICC stable in position.  Probable layering right pleural effusion with right lower lung atelectasis/consolidation.  The left infrahilar consolidation / atelectasis is stable.  Mild cardiomegaly.  IMPRESSION:  Stable appearance since previous   exam.   Original Report Authenticated By: D. Andria Rhein, MD   ASSESSMENT AND PLAN:  Principal Problem:   Recurrent Clostridium difficile diarrhea Active Problems:   COPD (chronic obstructive pulmonary disease)   Obesity   Hyperlipidemia   Diabetes mellitus type 2 in obese   Hypertension   Acute renal failure   Atrial fibrillation   Atrial  flutter   Cardiac arrest/complete heart block   Hypercapnic respiratory failure, chronic   Anasarca  1. Atrial flutter V rates are mostly controlled.  At times, her rates do climb with exertion.   I will increase diltiazem to 240mg  BID today. Continue coumadin Follow-up with Dr Ladona Ridgel as scheduled.

## 2012-10-28 NOTE — Care Management Note (Signed)
    Page 1 of 2   10/30/2012     3:42:27 PM   CARE MANAGEMENT NOTE 10/30/2012  Patient:  [name], [name]   Account Number:  [id number]  Date Initiated:  10/10/2012  Documentation initiated by:  [facility]  Subjective/Objective Assessment:   REadmission with hypotension - diarrhea.     Action/Plan:   PT/OT evals, 8/26- CIR consulted   Anticipated DC Date:  10/30/2012   Anticipated DC Plan:  SKILLED NURSING FACILITY  In-house referral  Clinical Social Worker      DC Planning Services  CM consult      Choice offered to / List presented to:             Status of service:  Completed, signed off Medicare Important Message given?   (If response is "NO", the following Medicare IM given date fields will be blank) Date Medicare IM given:   Date Additional Medicare IM given:    Discharge Disposition:  SKILLED NURSING FACILITY  Per UR Regulation:  Reviewed for med. necessity/level of care/duration of stay  If discussed at Long Length of Stay Meetings, dates discussed:   10/21/2012  10/23/2012  10/28/2012  10/30/2012    Comments:  Contact:  Green,Velvet Daughter     [phone]                 Caples,Steve Spouse     [phone]  10/30/12 Zamar Odwyer,RN,BSN [id number] INSURANCE HAS DENIED CIR STAY.  PT TO DC TO SNF TODAY, PER CSW ARRANGEMENTS.  10/29/12 Miliyah Luper,RN,BSN [id number] INSURANCE APPROVAL PENDING FOR IP REHAB.  CSW HAS CONSULTED WITH PT AND FAXED OUT FOR SNF BED OFFERS AS BACKUP SHOULD INSURANCE NOT APPROVE CIR.  IT APPEARS WE WILL NOT HAVE INSURANCE ANSWER TODAY; SHOULD KNOW BY AM.  IF DECLINED FOR CIR BY INSURANCE, WILL DC TO SNF, PER CSW ARRANGEMENTS.  10/28/12- 1430- Donn Pierini RN, BSN [phone] Received call from pt's daughter Cleotis Nipper- regarding pt's d/c options - daughter would like to check into [facility] as an option - pt was there first part of the year and daughter is concerned about pt's progress. Call made to [facility] with [facility] to see if  [facility] might [facility]- and due to pt's insurance she would not meet criteria for [facility] at this time- per notes in chart CIR does feel like pt would be a good candidate for them pending insurance approval- call made back to daughter to explain [facility] is not an option and to discuss CIR option- daughter is very open to CIR option and would really like mom to go to CIR- explained that this option would depend on insurance approval and SNF rehab would still need to be considered as a back up plan if needed. Daughter voiced understanding. Will await CIR rehab RN to f/u- family hopeful that CIR option will work out.  10/28/12- 1230- Donn Pierini RN, BSN [phone] referral for CIR consult- consult placed for CIR - has still been using BIPAP prn.

## 2012-10-28 NOTE — Progress Notes (Signed)
PULMONARY  / CRITICAL CARE MEDICINE  Name: [name] MRN: [id number] DOB: 18-Aug-1955    ADMISSION DATE:  10/09/2012 CONSULTATION DATE:  10/09/2012  REFERRING MD :  Vivia Ewing MD PRIMARY SERVICE: PCCM  CHIEF COMPLAINT:  Diarrhea  BRIEF PATIENT DESCRIPTION: 24 yowf former smoker  with a history of recurrent C.diff returned to the [facility] ED on 8/7 with confusion, diarrhea, subjective fevers, chills, and abdominal pain.  Found to be hypotensive with AKI.  BP initially improved with IVF in ED, but then shock. Admitted to 2100.  Transferred out of ICU and to [facility] 8/14. Transferred back to ICU 8/17 with hypercarbic resp failure. Notably, had received multiple doses of opioid analgesics and had been refusing nocturnal CPAP for 2-3 days prior to transfer back to ICU    LINES / TUBES: R IJ CVL 8/7 >> 8/19 PICC 8/19 >>   CULTURES: Blood 8/7 >> Neg Urine 8/7 >> Neg C diff 8/8 >> Neg  ANTIBIOTICS: flagyl 8/7 >> 8/11  fidaxomicin 8/13 >> 8/25 Enteral vanc (taper) 8/25 >>   SUBJECTIVE/OVERNIGHT:  Continues improved. No distress  VITAL SIGNS: Temp:  [97.8 F (36.6 C)-98.5 F (36.9 C)] 98.5 F (36.9 C) (08/26 1200) Pulse Rate:  [60-74] 74 (08/26 1110) Resp:  [14-20] 15 (08/26 0300) BP: (111-128)/(61-74) 118/70 mmHg (08/26 1200) SpO2:  [92 %-97 %] 96 % (08/26 1355) FiO2 (%):  [32 %-40 %] 40 % (08/26 0143) Weight:  [98.1 kg (216 lb 4.3 oz)] 98.1 kg (216 lb 4.3 oz) (08/26 0800)    VENTILATOR SETTINGS: Vent Mode:  [-]  FiO2 (%):  [32 %-40 %] 40 % INTAKE / OUTPUT: Intake/Output     08/25 0701 - 08/26 0700 08/26 0701 - 08/27 0700   P.O. 1520 360   I.V. (mL/kg)  10 (0.1)   Total Intake(mL/kg) 1520 (15.1) 370 (3.8)   Urine (mL/kg/hr) 2625 (1.1) 550 (0.7)   Total Output 2625 550   Net -1105 -180        Urine Occurrence 4 x 3 x   Stool Occurrence 2 x      PHYSICAL EXAMINATION: Gen: NAD HEENT: WNL PULM: distant bs, no wheeze CV: Tachy, irreg, no M noted AB: obese, soft, NT,  NABS Ext: BUE, BLE edema improving Neuro: no focal deficits  LABS:  CBC  Recent Labs Lab 10/24/12 0530 10/26/12 0415 10/27/12 0440  HGB 9.5* 8.9* 8.7*  HCT 31.6* 29.2* 29.2*  WBC 5.8 6.9 8.3  PLT 403* 278 272    Coag's Recent Labs     10/26/12  0415  10/27/12  0440  10/28/12  0500  INR  1.25  1.41  1.47    BMET  Recent Labs Lab 10/23/12 0440 10/24/12 0530 10/25/12 0435 10/26/12 0415 10/27/12 0440  NA 141 139 140 137 137  K 4.3 3.9 3.8 4.2 4.5  CL 93* 94* 96 95* 94*  CO2 >45* 42* 41* 39* 39*  GLUCOSE 191* 168* 123* 171* 155*  BUN 6 7 8 9 10   CREATININE 0.49* 0.48* 0.52 0.53 0.58  CALCIUM 8.7 8.6 8.5 8.6 8.9    Electrolytes Recent Labs     10/26/12  0415  10/27/12  0440  CALCIUM  8.6  8.9   Sepsis Markers No results found for this basename: LACTICACIDVEN, PROCALCITON, O2SATVEN,  in the last 72 hours  ABG No results found for this basename: PHART, PCO2, PCO2ART, PO2, PO2ART, HCO3, TCO2, O2SAT,  in the last 168 hours Liver Enzymes  Recent Labs Lab 10/24/12  0530 10/25/12 0435 10/26/12 0415 10/27/12 0440 10/28/12 0500  INR 2.12* 1.86* 1.25 1.41 1.47    Cardiac Enzymes No results found for this basename: TROPONINI, PROBNP,  in the last 72 hours Glucose Recent Labs     10/27/12  0734  10/27/12  1233  10/27/12  1559  10/27/12  2113  10/28/12  0711  10/28/12  1159  GLUCAP  157*  169*  204*  153*  139*  167*    CXR: NNF    ASSESSMENT / PLAN:   PULMONARY A: COPD OHS/OSA R pleural effusion Mild pulmonary edema Acute hypercarbic resp failure due to opiates - resolved 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 - placed because of prolonged pauses P:  Cont diltiazem (dose increased 8/22) Cont digoxin (initiated 8/22) Cont  warfarin per pharmacy Have asked Cards to make final recs re: meds for AF  RENAL  A:  AKI, resolved  Anasarca, severe hypervolemia, resolving - autodiuresing Hypernatremia,resolved 8/21 Hypokalemia, resolved 8/21 Met alk due to loop diuretics/ chronic hypercarbia P: Monitor BMET intermittently Correct electrolytes as indicated   GASTROINTESTINAL A: Obesity chronic colitis P:   Cont CHO mod diet (begun 8/19)  HEMATOLOGIC  A:  Mild anemia, no evidence of acute blood loss P:  -follow CBC intermittently  INFECTIOUS A:    Hx Recurrent C.diff P:   Cont enteral vanc as ordered  ENDOCRINE A:  DM2 P:   Cont ACHS SSI   NEUROLOGIC A:  Acute enceph due to hypercarbia - resolved Chronic pain syndrome Anxiety D/O P:   minimize sedating meds  Tramadol for pain Add requip for restless leg Add daily long acting alprazolam and cont low dose PRN     Eval for Rehab. DC planning. Trf to med-surg.   Billy Fischer, MD ; [facility] [phone].  After 5:30 PM or weekends, call [phone]

## 2012-10-28 NOTE — Progress Notes (Signed)
Physical Therapy Treatment Patient Details Name: [name] MRN: [id number] DOB: 12/02/1955 Today's Date: 10/28/2012 Time: 2952-8413 PT Time Calculation (min): 31 min  PT Assessment / Plan / Recommendation  History of Present Illness This is a 57 y/o female who presented on 8/7 with confusion, diarrhea, abd pain. She was hospitalized last December with ARDS from influenza and probable secondary pneumococcal pneumonia. For the tail end of her admission (in Jan) she developed C. difficile colitis. She was transferred to [facility] for ventilator weaning and rehab. She received a full course of treatment but continued to have diarrhea after she was discharged home (2/14). She returned to the hospital on 7/8 for diarrhea and hypotension- transferred to ICU on 7/9 for persisent hypotension and resp failure - intubated and placed on pressors. Found to have C diff colitis again. She was re-admitted on 7/28, treated again for C Diff and discharged on 8/2. This time had social services arrange outpt Vanc. She was re-admitted on 8/7 for diarrhea and hypotension. Pacemaker on 10/16/2012. Transferred back to ICU 8/17 with hypercarbic resp failure. Notably, had received multiple doses of opioid analgesics and had been refusing nocturnal CPAP for 2-3 days prior to transfer back to ICU   PT Comments   Pt able to ambulate this session however continues to be limited due to overall fatigue.  Pt continued to c/o back during mobility.  Applied warm pack to lower back for pain.   Follow Up Recommendations  CIR;Supervision/Assistance - 24 hour     Equipment Recommendations  None recommended by PT    Frequency Min 3X/week   Progress towards PT Goals Progress towards PT goals: Progressing toward goals  Plan Current plan remains appropriate    Precautions / Restrictions Precautions Precautions: Fall Precaution Comments: contact--brown, bipap; pacemaker postop @ 1 week Restrictions Weight Bearing Restrictions:  No   Pertinent Vitals/Pain C/o low back pain with all mobility and sitting in recliner; applied hot pack to lower back to increase length of time for upright posture    Mobility  Bed Mobility Bed Mobility: Supine to Sit;Sitting - Scoot to Edge of Bed;Sit to Supine Supine to Sit: 4: Min assist;HOB elevated;With rails Details for Bed Mobility Assistance: cueing for sequence and assist with increased time and pad to scoot forward, HOB 30degrees, pivoted to right Transfers Transfers: Sit to Stand;Stand to Sit Sit to Stand: 4: Min assist;From bed;From chair/3-in-1 Stand to Sit: 4: Min assist;To chair/3-in-1 Details for Transfer Assistance: cueing for hand placement, safety  Ambulation/Gait Ambulation/Gait Assistance: 1: +2 Total assist Ambulation/Gait: Patient Percentage: 80% Ambulation Distance (Feet): 20 Feet Assistive device: Rolling walker Ambulation/Gait Assistance Details: (A) to maintain balance with cues for RW placement. Gait Pattern: Step-through pattern;Decreased stride length Gait velocity: decreased Stairs: No     PT Diagnosis:    PT Problem List:   PT Treatment Interventions:     PT Goals (current goals can now be found in the care plan section) Acute Rehab PT Goals Patient Stated Goal: to go home and be stronger PT Goal Formulation: With patient Time For Goal Achievement: 10/31/12 Potential to Achieve Goals: Good  Visit Information  Last PT Received On: 10/28/12 Assistance Needed: +2 (safety and lines) History of Present Illness: This is a 57 y/o female who presented on 8/7 with confusion, diarrhea, abd pain. She was hospitalized last December with ARDS from influenza and probable secondary pneumococcal pneumonia. For the tail end of her admission (in Jan) she developed C. difficile colitis. She was transferred to [facility]  for ventilator weaning and rehab. She received a full course of treatment but continued to have diarrhea after she was discharged home (2/14). She  returned to the hospital on 7/8 for diarrhea and hypotension- transferred to ICU on 7/9 for persisent hypotension and resp failure - intubated and placed on pressors. Found to have C diff colitis again. She was re-admitted on 7/28, treated again for C Diff and discharged on 8/2. This time had social services arrange outpt Vanc. She was re-admitted on 8/7 for diarrhea and hypotension. Pacemaker on 10/16/2012. Transferred back to ICU 8/17 with hypercarbic resp failure. Notably, had received multiple doses of opioid analgesics and had been refusing nocturnal CPAP for 2-3 days prior to transfer back to ICU    Subjective Data  Subjective: "I had a bad episode yesterday.  I became ill as I sat up on the EOB to eat." Patient Stated Goal: to go home and be stronger   Cognition  Cognition Arousal/Alertness: Awake/alert Behavior During Therapy: WFL for tasks assessed/performed Overall Cognitive Status: Within Functional Limits for tasks assessed    Balance  Static Standing Balance Static Standing - Balance Support: No upper extremity supported Static Standing - Level of Assistance: 6: Modified independent (Device/Increase time) Static Standing - Comment/# of Minutes: ~3 minutes while pt needed total (A) to complete pericare  End of Session     GP     Zyden Suman 10/28/2012, 10:54 AM  Jake Shark, PT DPT [phone]

## 2012-10-28 NOTE — Progress Notes (Signed)
Patient being transferred to [facility] per MD order, report called to [place], Charity fundraiser. Patient will transfer in bed. All vital signs with in normal limits.

## 2012-10-28 NOTE — Progress Notes (Signed)
Patient wanted to come off bipap. Patient said she was tired of wearing it. RT removed bipap and place patient on [place]. Rt will continue to monitor.

## 2012-10-29 ENCOUNTER — Ambulatory Visit: Payer: Medicare HMO

## 2012-10-29 LAB — GLUCOSE, CAPILLARY
Glucose-Capillary: 143 mg/dL — ABNORMAL HIGH (ref 70–99)
Glucose-Capillary: 142 mg/dL — ABNORMAL HIGH (ref 70–99)

## 2012-10-29 LAB — PROTIME-INR
INR: 1.45 (ref 0.00–1.49)
Prothrombin Time: 17.3 s — ABNORMAL HIGH (ref 11.6–15.2)

## 2012-10-29 MED ORDER — WARFARIN SODIUM 10 MG PO TABS
10.0000 mg | ORAL_TABLET | Freq: Once | ORAL | Status: AC
  Administered 2012-10-29: 10 mg via ORAL
  Filled 2012-10-29: qty 1

## 2012-10-29 NOTE — Progress Notes (Signed)
Patient: [name] Date of Encounter: 10/29/2012, 10:02 AM Admit date: 10/09/2012     Subjective  [name] feels she is slowly improving. She continues to have DOE. She denies CP or palpitations.    Objective  Physical Exam: Vitals: BP 124/73  Pulse 71  Temp(Src) 97.7 F (36.5 C) (Oral)  Resp 16  Ht 5\' 5"  (1.651 m)  Wt 216 lb 4.3 oz (98.1 kg)  BMI 35.99 kg/m2  SpO2 97% General: Well developed, overweight 57 year old female in no acute distress. Neck: Supple. JVD not elevated. Lungs: Clear bilaterally to auscultation without wheezes, rales, or rhonchi. Breathing is unlabored. Heart: Irregular S1 S2 without murmur, rub or gallop.  Abdomen: Soft, non-distended. Extremities: No clubbing or cyanosis. No edema.   Neuro: Alert and oriented X 3. Moves all extremities spontaneously. No focal deficits. Skin: Left upper chest / implant site intact with bleeding, hematoma, erythema, edema, warmth or drainage.  Intake/Output:  Intake/Output Summary (Last 24 hours) at 10/29/12 1002 Last data filed at 10/29/12 0800  Gross per 24 hour  Intake    370 ml  Output   1550 ml  Net  -1180 ml    Inpatient Medications:  . ALPRAZolam  0.5 mg Oral QHS  . budesonide  0.25 mg Nebulization Q6H  . digoxin  0.25 mg Oral Daily  . diltiazem  240 mg Oral BID  . famotidine  20 mg Oral BID  . feeding supplement  237 mL Oral BID BM  . feeding supplement  1 Container Oral Q24H  . insulin aspart  0-15 Units Subcutaneous TID WC  . insulin aspart  0-5 Units Subcutaneous QHS  . insulin aspart  3 Units Subcutaneous TID WC  . ipratropium  0.5 mg Nebulization Q6H  . levalbuterol  0.63 mg Nebulization Q6H  . metFORMIN  500 mg Oral BID WC  . rOPINIRole  1 mg Oral QHS  . sertraline  50 mg Oral Daily  . sodium chloride  10-40 mL Intracatheter Q12H  . vancomycin  125 mg Oral QID   Followed by  . [START ON 11/03/2012] vancomycin  125 mg Oral BID   Followed by  . [START ON 11/10/2012] vancomycin  125  mg Oral Daily   Followed by  . [START ON 11/17/2012] vancomycin  125 mg Oral QODAY   Followed by  . [START ON 11/25/2012] vancomycin  125 mg Oral Q3 days  . Warfarin - Pharmacist Dosing Inpatient   Does not apply q1800    Labs:  Recent Labs  10/27/12 0440  NA 137  K 4.5  CL 94*  CO2 39*  GLUCOSE 155*  BUN 10  CREATININE 0.58  CALCIUM 8.9    Recent Labs  10/27/12 0440  WBC 8.3  HGB 8.7*  HCT 29.2*  MCV 93.3  PLT 272    Recent Labs  10/27/12 0440  TSH 2.585    Recent Labs  10/29/12 0435  INR 1.45    Radiology/Studies: Dg Chest Port 1 View 10/25/2012   *RADIOLOGY REPORT*  Clinical Data: Respiratory failure  PORTABLE CHEST - 1 VIEW  Comparison: 10/23/2012  Findings: Left subclavian pacemaker and right arm PICC stable in position.  Probable layering right pleural effusion with right lower lung atelectasis/consolidation.  The left infrahilar consolidation / atelectasis is stable.  Mild cardiomegaly.  IMPRESSION:  Stable appearance since previous   exam.   Original Report Authenticated By: D. Andria Rhein, MD   Dg Chest Port 1 View 10/23/2012   *  RADIOLOGY REPORT*  Clinical Data: Dyspnea  PORTABLE CHEST - 1 VIEW  Comparison:  October 22, 2012  Findings: There is persistent consolidation in portions of the right middle and lower lobes with right effusion.  Left lung is clear.  Heart is upper normal in size with normal pulmonary vascularity.  Pacemaker leads were attached to the right atrium and right ventricle.  Central catheter tip is at the cavoatrial junction level.  No pneumothorax.  IMPRESSION: No pneumothorax.  Persistent right effusion with consolidation in portions of the right middle and lower lobes.   Original Report Authenticated By: Bretta Bang, M.D.    Echocardiogram: 10/16/2012 - Left ventricle: The LV contractility pattern was somehwat dyssynchronous because of the bundle branch block. The cavity size was normal. Wall thickness was normal.  Systolic function was normal. The estimated ejection fraction was in the range of 50% to 55%. - Aortic valve: Structurally normal valve. Cusp separation was normal. Doppler: Transvalvular velocity was within the normal range. There was no stenosis. No regurgitation. - Aorta: Aortic root: The aortic root was normal in size. Ascending aorta: The ascending aorta was normal in size. - Mitral valve: Structurally normal valve. Leaflet separation was normal. Doppler: Transvalvular velocity was within the normal range. There was no evidence for stenosis. No regurgitation. - Left atrium: The atrium was normal in size. - Right ventricle: The cavity size was normal. Systolic function was normal. - Pulmonic valve: Doppler: No significant regurgitation. - Tricuspid valve: Doppler: Mild regurgitation. - Pulmonary artery: Systolic pressure was within the normal range. - Right atrium: The atrium was normal in size. - Pericardium: There was no pericardial effusion.   Telemetry: atrial flutter, V rate 70 bpm    Assessment and Plan  1. 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 Atrial fibrillation / flutter  - newly diagnosed  - atria sizes are normal so suspect AF burden is low - V rates mostly controlled although increase to 130s with exertion so diltiazem increased yesterday with improved rates; continue digoxin - continue warfarin for embolic prophylaxis 3. Hypotension  - resolved 4. Recurrent C.diff colitis  5. ARF  - resolved 6. Anemia - per primary team  Dr. Graciela Husbands to see Signed, EDMISTEN, BROOKE PA-C  Needs dig level Would anticpate cardioversion after therapeutic for 3 weeks Might benefit from low dose BB

## 2012-10-29 NOTE — Progress Notes (Signed)
PHARMACY FOLLOW UP CONSULT NOTE   Pharmacy Consult :  Coumadin Indication: atrial fibrillation  Dosing Weight: 98 kg   Recent Labs  10/27/12 0440 10/28/12 0500 10/29/12 0435  HGB 8.7*  --   --   HCT 29.2*  --   --   PLT 272  --   --   INR 1.41 1.47 1.45  CREATININE 0.58  --   --    Lab Results  Component Value Date   INR 1.45 10/29/2012   INR 1.47 10/28/2012   INR 1.41 10/27/2012   Estimated Creatinine Clearance: 88.8 ml/min (by C-G formula based on Cr of 0.58).  Medications:  Scheduled:  . ALPRAZolam  0.5 mg Oral QHS  . budesonide  0.25 mg Nebulization Q6H  . digoxin  0.25 mg Oral Daily  . diltiazem  240 mg Oral BID  . famotidine  20 mg Oral BID  . feeding supplement  237 mL Oral BID BM  . feeding supplement  1 Container Oral Q24H  . insulin aspart  0-15 Units Subcutaneous TID WC  . insulin aspart  0-5 Units Subcutaneous QHS  . insulin aspart  3 Units Subcutaneous TID WC  . ipratropium  0.5 mg Nebulization Q6H  . levalbuterol  0.63 mg Nebulization Q6H  . metFORMIN  500 mg Oral BID WC  . rOPINIRole  1 mg Oral QHS  . sertraline  50 mg Oral Daily  . sodium chloride  10-40 mL Intracatheter Q12H  . vancomycin  125 mg Oral QID   Followed by  . [START ON 11/03/2012] vancomycin  125 mg Oral BID   Followed by  . [START ON 11/10/2012] vancomycin  125 mg Oral Daily   Followed by  . [START ON 11/17/2012] vancomycin  125 mg Oral QODAY   Followed by  . [START ON 11/25/2012] vancomycin  125 mg Oral Q3 days  . Warfarin - Pharmacist Dosing Inpatient   Does not apply q1800   Anti-infectives   Start     Dose/Rate Route Frequency Ordered Stop   11/25/12 1000  vancomycin (VANCOCIN) 50 mg/mL oral solution 125 mg  Status:  Discontinued     125 mg Oral Every 3 DAYS 10/16/12 1654 10/17/12 1338   11/25/12 1000  vancomycin (VANCOCIN) 50 mg/mL oral solution 125 mg     125 mg Oral Every 3 DAYS 10/17/12 1340 12/10/12 0959   11/19/12 1000  vancomycin (VANCOCIN) 50 mg/mL oral solution 125 mg   Status:  Discontinued     125 mg Oral Every 3 DAYS 10/13/12 1653 10/15/12 1809   11/17/12 1000  vancomycin (VANCOCIN) 50 mg/mL oral solution 125 mg  Status:  Discontinued     125 mg Oral Every other day 10/16/12 1654 10/17/12 1338   11/17/12 1000  vancomycin (VANCOCIN) 50 mg/mL oral solution 125 mg     125 mg Oral Every other day 10/17/12 1340 11/25/12 0959   11/11/12 1000  vancomycin (VANCOCIN) 50 mg/mL oral solution 125 mg  Status:  Discontinued     125 mg Oral Every other day 10/13/12 1653 10/15/12 1809   11/10/12 1000  vancomycin (VANCOCIN) 50 mg/mL oral solution 125 mg  Status:  Discontinued     125 mg Oral Daily 10/16/12 1654 10/17/12 1338   11/10/12 1000  vancomycin (VANCOCIN) 50 mg/mL oral solution 125 mg     125 mg Oral Daily 10/17/12 1340 11/17/12 0959   11/04/12 1000  vancomycin (VANCOCIN) 50 mg/mL oral solution 125 mg  Status:  Discontinued  125 mg Oral Daily 10/13/12 1653 10/15/12 1809   11/03/12 1000  vancomycin (VANCOCIN) 50 mg/mL oral solution 125 mg  Status:  Discontinued     125 mg Oral 2 times daily 10/16/12 1654 10/17/12 1338   11/03/12 1000  vancomycin (VANCOCIN) 50 mg/mL oral solution 125 mg     125 mg Oral 2 times daily 10/17/12 1340 11/10/12 0959   10/27/12 2200  vancomycin (VANCOCIN) 50 mg/mL oral solution 125 mg  Status:  Discontinued     125 mg Oral 2 times daily 10/13/12 1653 10/15/12 1809   10/27/12 1000  vancomycin (VANCOCIN) 50 mg/mL oral solution 125 mg  Status:  Discontinued     125 mg Oral 4 times daily 10/16/12 1654 10/17/12 1338   10/27/12 1000  vancomycin (VANCOCIN) 50 mg/mL oral solution 125 mg     125 mg Oral 4 times daily 10/17/12 1340 11/03/12 0959   10/16/12 1400  ceFAZolin (ANCEF) IVPB 2 g/50 mL premix     2 g 100 mL/hr over 30 Minutes Intravenous Every 6 hours 10/16/12 1215 10/17/12 0145   10/16/12 1300  vancomycin (VANCOCIN) 50 mg/mL oral solution 500 mg  Status:  Discontinued     500 mg Oral 4 times per day 10/16/12 1207 10/17/12 1338    10/16/12 1200  ceFAZolin (ANCEF) IVPB 2 g/50 mL premix  Status:  Discontinued     2 g 100 mL/hr over 30 Minutes Intravenous 4 times per day 10/16/12 0951 10/16/12 1215   10/16/12 0600  gentamicin (GARAMYCIN) 80 mg in sodium chloride irrigation 0.9 % 500 mL irrigation  Status:  Discontinued     80 mg Irrigation On call 10/15/12 1920 10/16/12 0951   10/16/12 0600  ceFAZolin (ANCEF) IVPB 2 g/50 mL premix     2 g 100 mL/hr over 30 Minutes Intravenous On call 10/15/12 1920 10/16/12 0649   10/15/12 2200  fidaxomicin (DIFICID) tablet 200 mg     200 mg Oral 2 times daily 10/15/12 1809 10/26/12 2359   10/13/12 1830  vancomycin (VANCOCIN) 50 mg/mL oral solution 500 mg  Status:  Discontinued     500 mg Oral 4 times daily 10/13/12 1653 10/15/12 1809   10/13/12 1400  metroNIDAZOLE (FLAGYL) IVPB 500 mg  Status:  Discontinued     500 mg 100 mL/hr over 60 Minutes Intravenous Every 8 hours 10/13/12 1110 10/14/12 1724   10/10/12 1000  metroNIDAZOLE (FLAGYL) tablet 500 mg  Status:  Discontinued     500 mg Oral 3 times per day 10/10/12 0235 10/13/12 1110   10/10/12 0800  vancomycin (VANCOCIN) 50 mg/mL oral solution 500 mg  Status:  Discontinued     500 mg Oral 4 times per day 10/10/12 0239 10/13/12 1650   10/10/12 0000  vancomycin (VANCOCIN) 50 mg/mL oral solution 500 mg  Status:  Discontinued     500 mg Oral 4 times per day 10/09/12 2135 10/10/12 0239   10/09/12 2200  metroNIDAZOLE (FLAGYL) tablet 500 mg  Status:  Discontinued     500 mg Oral 3 times per day 10/09/12 2135 10/10/12 0235   10/09/12 1545  metroNIDAZOLE (FLAGYL) IVPB 500 mg     500 mg 100 mL/hr over 60 Minutes Intravenous  Once 10/09/12 1539 10/09/12 1721      Assessment:  57 YOF on Coumadin for new onset Afib.  INR remain SUBtherapeutic 1.45.  All Coumadin doses charted.  No bleeding complications noted   Goal of Therapy:  INR 2-3   Plan:  Coumadin 10 mg today. Daily INR's, CBC.  Monitor for bleeding complications.    Kamaree Berkel, Elisha Headland, Pharm.D.  10/29/2012,11:40 AM

## 2012-10-29 NOTE — Progress Notes (Signed)
Clinical Social Work Department BRIEF PSYCHOSOCIAL ASSESSMENT 10/29/2012  Patient:  [name], [name]     Account Number:  [id number]     Admit date:  10/09/2012  Clinical Social Worker:  Carren Rang  Date/Time:  10/29/2012 01:40 PM  Referred by:  Care Management  Date Referred:  10/29/2012 Referred for  SNF Placement   Other Referral:   Interview type:  Patient Other interview type:    PSYCHOSOCIAL DATA Living Status:  HUSBAND Admitted from facility:   Level of care:   Primary support name:  Lachanda Buczek Primary support relationship to patient:  SPOUSE Degree of support available:   Good, Daughter-Velvet [phone]    CURRENT CONCERNS Current Concerns  Post-Acute Placement   Other Concerns:    SOCIAL WORK ASSESSMENT / PLAN CSW received referral for backup placement if patient does not go to [facility]. CSW introduced self and explained reason for visit. CSW explained SNF process and provided SNF packet to patient.  Patient reported she really wants to go to [facility] but is agreeable for SNF placement. Patient stated she will give the packet to her daughter who will look at facilities. Patient states she is not familiar with any facilities but wants to be faxed out to [facility] and [facility].  CSW will complete FL2 for MD's signature and will update patient and family when bed offers are received.   Assessment/plan status:  Psychosocial Support/Ongoing Assessment of Needs Other assessment/ plan:   Information/referral to community resources:   snf packet    PATIENT'S/FAMILY'S RESPONSE TO PLAN OF CARE: Patient states she wants to go to [facility]. Patient reports she really does not want to go to SNF but was agreeable to be faxed out as a second choice.       Maree Krabbe, MSW, [facility] [phone]

## 2012-10-29 NOTE — Progress Notes (Signed)
PULMONARY  / CRITICAL CARE MEDICINE  Name: [name] MRN: [id number] DOB: January 10, 1956    ADMISSION DATE:  10/09/2012 CONSULTATION DATE:  10/09/2012  REFERRING MD :  Vivia Ewing MD PRIMARY SERVICE: PCCM  CHIEF COMPLAINT:  Diarrhea  BRIEF PATIENT DESCRIPTION: 68 yowf former smoker  with a history of recurrent C.diff returned to the [facility] ED on 8/7 with confusion, diarrhea, subjective fevers, chills, and abdominal pain.  Found to be hypotensive with AKI.  BP initially improved with IVF in ED, but then shock. Admitted to 2100.  Transferred out of ICU and to [facility] 8/14. Transferred back to ICU 8/17 with hypercarbic resp failure. Notably, had received multiple doses of opioid analgesics and had been refusing nocturnal CPAP for 2-3 days prior to transfer back to ICU    LINES / TUBES: R IJ CVL 8/7 >> 8/19 PICC 8/19 >> 8/27  CULTURES: Blood 8/7 >> Neg Urine 8/7 >> Neg C diff 8/8 >> Neg  ANTIBIOTICS: flagyl 8/7 >> 8/11  fidaxomicin 8/13 >> 8/25 Enteral vanc (taper) 8/25 >>   SUBJECTIVE/OVERNIGHT:  No new complaints  VITAL SIGNS: Temp:  [97.7 F (36.5 C)-98.2 F (36.8 C)] 97.7 F (36.5 C) (08/27 0433) Pulse Rate:  [70-71] 71 (08/27 0433) Resp:  [16-18] 16 (08/27 0433) BP: (101-142)/(60-84) 101/60 mmHg (08/27 1040) SpO2:  [90 %-97 %] 97 % (08/27 0433)    VENTILATOR SETTINGS:   INTAKE / OUTPUT: Intake/Output     08/26 0701 - 08/27 0700 08/27 0701 - 08/28 0700   P.O. 360 240   I.V. (mL/kg) 10 (0.1)    Total Intake(mL/kg) 370 (3.8) 240 (2.4)   Urine (mL/kg/hr) 1250 (0.5) 950 (1.2)   Stool  1 (0)   Total Output 1250 951   Net -880 -711        Urine Occurrence 8 x    Stool Occurrence 4 x      PHYSICAL EXAMINATION: Gen: NAD HEENT: WNL PULM: distant bs, no wheeze CV: Tachy, irreg, no M noted AB: obese, soft, NT, NABS Ext: edema resolved Neuro: no focal deficits  LABS:  CBC  Recent Labs Lab 10/24/12 0530 10/26/12 0415 10/27/12 0440  HGB 9.5* 8.9* 8.7*  HCT 31.6*  29.2* 29.2*  WBC 5.8 6.9 8.3  PLT 403* 278 272    Coag's Recent Labs     10/27/12  0440  10/28/12  0500  10/29/12  0435  INR  1.41  1.47  1.45    BMET  Recent Labs Lab 10/23/12 0440 10/24/12 0530 10/25/12 0435 10/26/12 0415 10/27/12 0440  NA 141 139 140 137 137  K 4.3 3.9 3.8 4.2 4.5  CL 93* 94* 96 95* 94*  CO2 >45* 42* 41* 39* 39*  GLUCOSE 191* 168* 123* 171* 155*  BUN 6 7 8 9 10   CREATININE 0.49* 0.48* 0.52 0.53 0.58  CALCIUM 8.7 8.6 8.5 8.6 8.9    Electrolytes Recent Labs     10/27/12  0440  CALCIUM  8.9   Sepsis Markers No results found for this basename: LACTICACIDVEN, PROCALCITON, O2SATVEN,  in the last 72 hours  ABG No results found for this basename: PHART, PCO2, PCO2ART, PO2, PO2ART, HCO3, TCO2, O2SAT,  in the last 168 hours Liver Enzymes  Recent Labs Lab 10/25/12 0435 10/26/12 0415 10/27/12 0440 10/28/12 0500 10/29/12 0435  INR 1.86* 1.25 1.41 1.47 1.45    Cardiac Enzymes No results found for this basename: TROPONINI, PROBNP,  in the last 72 hours Glucose Recent Labs  10/28/12  0711  10/28/12  1159  10/28/12  1643  10/28/12  2129  10/29/12  0627  10/29/12  1118  GLUCAP  139*  167*  167*  170*  143*  142*    CXR: NNF    ASSESSMENT / PLAN:   PULMONARY A: COPD OHS/OSA R pleural effusion Mild pulmonary edema Acute hypercarbic resp failure due to opiates - resolved 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 - placed because of prolonged pauses P:  Cont diltiazem (dose increased 8/26) Cont digoxin (initiated 8/22) Cont warfarin per pharmacy  RENAL  A:  AKI, resolved  Anasarca, severe hypervolemia, resolved 8/27 Hypernatremia,resolved 8/21 Hypokalemia, resolved 8/21 Met alk due to loop diuretics/compensation  for chronic resp failure P: Monitor BMET intermittently Correct electrolytes as indicated   GASTROINTESTINAL A: Obesity chronic colitis P:   Cont CHO mod diet (begun 8/19)  HEMATOLOGIC  A:  Mild anemia, no evidence of acute blood loss P:  -follow CBC intermittently  INFECTIOUS A:    Hx Recurrent C.diff P:   Cont enteral vanc as ordered  ENDOCRINE A:  DM2 P:   Cont ACHS SSI  Cont metformin - initiated 8/25  NEUROLOGIC A:  Acute enceph due to hypercarbia , resolved Chronic pain syndrome, controlled Restless leg syndrome Anxiety D/O, controlled  P:   minimize sedating meds  Cont Tramadol for pain Cont requip for restless leg Cont daily long acting alprazolam (initiated 8/26) Cont low dose PRN alprazolam   Working on placement - CIR vs SNF rehab   Billy Fischer, MD ; [facility] service Mobile [phone].  After 5:30 PM or weekends, call [phone]

## 2012-10-29 NOTE — Progress Notes (Signed)
Clinical Social Work Department CLINICAL SOCIAL WORK PLACEMENT NOTE 10/29/2012  Patient:  SANGEETA, YOUSE  Account Number:  [id number] Admit date:  10/09/2012  Clinical Social Worker:  Carren Rang  Date/time:  10/29/2012 01:45 PM  Clinical Social Work is seeking post-discharge placement for this patient at the following level of care:   SKILLED NURSING   (*CSW will update this form in Epic as items are completed)   10/29/2012  Patient/family provided with [facility] Department of Clinical Social Work's list of facilities offering this level of care within the geographic area requested by the patient (or if unable, by the patient's family).  10/29/2012  Patient/family informed of their freedom to choose among providers that offer the needed level of care, that participate in Medicare, Medicaid or managed care program needed by the patient, have an available bed and are willing to accept the patient.  10/29/2012  Patient/family informed of [facility]' ownership interest in [facility], as well as of the fact that they are under no obligation to receive care at this facility.  PASARR submitted to EDS on 10/29/2012 PASARR number received from EDS on 10/29/2012  FL2 transmitted to all facilities in geographic area requested by pt/family on  10/29/2012 FL2 transmitted to all facilities within larger geographic area on   Patient informed that his/her managed care company has contracts with or will negotiate with  certain facilities, including the following:     Patient/family informed of bed offers received:   Patient chooses bed at  Physician recommends and patient chooses bed at    Patient to be transferred to  on   Patient to be transferred to facility by   The following physician request were entered in Epic:   Additional Comments:  Maree Krabbe, MSW, [employer] [phone]

## 2012-10-29 NOTE — Progress Notes (Signed)
Working on possible [employer] OT eval [previous session was 8/22-too long ago].  Will f/up. [phone]

## 2012-10-29 NOTE — Progress Notes (Signed)
After placing new peripheral iv, notified iv team nurse to remove picc line.

## 2012-10-29 NOTE — Progress Notes (Signed)
RT spoke to patient about trying to wear BiPAP.  Patient stated she was certain she did not want to wear BiPAP tonight.

## 2012-10-29 NOTE — Progress Notes (Signed)
Report received from [facility].  Student nurse continues care under instructor.

## 2012-10-29 NOTE — Progress Notes (Signed)
Occupational Therapy Treatment Patient Details Name: [name] MRN: [id number] DOB: 04-08-1955 Today's Date: 10/29/2012 Time: 4696-2952 OT Time Calculation (min): 29 min  OT Assessment / Plan / Recommendation  History of present illness This is a 57 y/o female who presented on 8/7 with confusion, diarrhea, abd pain. She was hospitalized last December with ARDS from influenza and probable secondary pneumococcal pneumonia. For the tail end of her admission (in Jan) she developed C. difficile colitis. She was transferred to [facility] for ventilator weaning and rehab. She received a full course of treatment but continued to have diarrhea after she was discharged home (2/14). She returned to the hospital on 7/8 for diarrhea and hypotension- transferred to ICU on 7/9 for persisent hypotension and resp failure - intubated and placed on pressors. Found to have C diff colitis again. She was re-admitted on 7/28, treated again for C Diff and discharged on 8/2. This time had social services arrange outpt Vanc. She was re-admitted on 8/7 for diarrhea and hypotension. Pacemaker on 10/16/2012. Transferred back to ICU 8/17 with hypercarbic resp failure. Notably, had received multiple doses of opioid analgesics and had been refusing nocturnal CPAP for 2-3 days prior to transfer back to ICU   OT comments  Pt has made steady progress since last visit, however, continues with deficits with ADL and mobility. Discussed rehab options with pt and she is adamant about not going to SNF. Feel pt would benefit from CIR to facilitate safe D/C home. Will follow acutely  Follow Up Recommendations  CIR    Barriers to Discharge       Equipment Recommendations  None recommended by OT    Recommendations for Other Services Rehab consult  Frequency Min 2X/week   Progress towards OT Goals Progress towards OT goals: Progressing toward goals  Plan Discharge plan needs to be updated    Precautions / Restrictions  Precautions Precautions: Fall Precaution Comments: contact--brown,  pacemaker  Restrictions Weight Bearing Restrictions: No Other Position/Activity Restrictions: pacemaker precautions   Pertinent Vitals/Pain desat to 86 4L after toilet transfer.  rebound to 91 4L    ADL  Grooming: Set up Lower Body Dressing: Moderate assistance Where Assessed - Lower Body Dressing: Supported sit to stand Toilet Transfer: Minimal assistance Statistician Method: Surveyor, minerals: Materials engineer and Hygiene: Maximal assistance Where Assessed - Engineer, mining and Hygiene: Sit to stand from 3-in-1 or toilet Equipment Used: Rolling walker;Gait belt Transfers/Ambulation Related to ADLs: min A RW level. vc for pt to not push/pull through LUE ADL Comments: Educated opn precuations following pacemaker placement. Pt continues to rely heavily on LUE for mobility. would benefit from AE for LB ADL    OT Diagnosis:    OT Problem List:   OT Treatment Interventions:     OT Goals(current goals can now be found in the care plan section) Acute Rehab OT Goals Patient Stated Goal: to go home and be stronger OT Goal Formulation: Patient unable to participate in goal setting Time For Goal Achievement: 11/03/12 Potential to Achieve Goals: Good ADL Goals Pt Will Perform Grooming: with mod assist;bed level Additional ADL Goal #1: Pt will roll right and left with Mod A to A with BADLs Additional ADL Goal #2: Pt will come up to sit with Mod A as precursor to BADLs  Visit Information  Last OT Received On: 10/29/12 Assistance Needed: +1 History of Present Illness: This is a 57 y/o female who presented on 8/7 with confusion,  diarrhea, abd pain. She was hospitalized last December with ARDS from influenza and probable secondary pneumococcal pneumonia. For the tail end of her admission (in Jan) she developed C. difficile colitis. She was transferred to  [facility] for ventilator weaning and rehab. She received a full course of treatment but continued to have diarrhea after she was discharged home (2/14). She returned to the hospital on 7/8 for diarrhea and hypotension- transferred to ICU on 7/9 for persisent hypotension and resp failure - intubated and placed on pressors. Found to have C diff colitis again. She was re-admitted on 7/28, treated again for C Diff and discharged on 8/2. This time had social services arrange outpt Vanc. She was re-admitted on 8/7 for diarrhea and hypotension. Pacemaker on 10/16/2012. Transferred back to ICU 8/17 with hypercarbic resp failure. Notably, had received multiple doses of opioid analgesics and had been refusing nocturnal CPAP for 2-3 days prior to transfer back to ICU    Subjective Data      Prior Functioning       Cognition  Cognition Arousal/Alertness: Awake/alert Behavior During Therapy: WFL for tasks assessed/performed Overall Cognitive Status: Within Functional Limits for tasks assessed    Mobility  Bed Mobility Bed Mobility: Right Sidelying to Sit;Sit to Supine Right Sidelying to Sit: 4: Min assist Sitting - Scoot to Edge of Bed: 6: Modified independent (Device/Increase time) Sit to Supine: 4: Min assist Details for Bed Mobility Assistance: vc to not pull/push with LUE Transfers Transfers: Sit to Stand;Stand to Sit Sit to Stand: 4: Min assist;From bed Stand to Sit: 4: Min assist Details for Transfer Assistance: cueing for hand placement, safety     Exercises  Other Exercises Other Exercises: encouraged RUE strengthening. LUE AROM below shoulder level   Balance  minguard   End of Session OT - End of Session Equipment Utilized During Treatment: Gait belt;Rolling walker Activity Tolerance: Patient tolerated treatment well Patient left: in bed;with call bell/phone within reach Nurse Communication: Mobility status  GO     Jaevon Paras,HILLARY 10/29/2012, 4:51 PM [facility], OTR/L   [phone] 10/29/2012

## 2012-10-29 NOTE — Progress Notes (Signed)
RT attempted BIPAP.  After 5 minutes, patient refused and was returned to [facility].

## 2012-10-29 NOTE — Progress Notes (Signed)
NUTRITION FOLLOW-UP  DOCUMENTATION CODES Per approved criteria  -Morbid Obesity   INTERVENTION: 1.  Continue Supplements; Ensure Complete po BID, each supplement provides 350 kcal and 13 grams of protein.   NUTRITION DIAGNOSIS: Inadequate oral intake related to altered gi function as evidenced by limited meal completion.  Ongoing   Goal: Pt to meet >/= 90% of their estimated nutrition needs - unmet.  Monitor:  PO intake, weight trend, labs  ASSESSMENT: Pt with hx of c.diff since 02/2012. Pt admitted with confusion, diarrhea, fevers, chills and abd pain. Pt found to be hypotensive with AKI.   Continues with variable meal intake. States appetite is improving. Drinking Ensure supplements with out problems.    Height: Ht Readings from Last 1 Encounters:  10/10/12 5\' 5"  (1.651 m)    Weight: Wt Readings from Last 1 Encounters:  10/28/12 216 lb 4.3 oz (98.1 kg)    BMI:  Body mass index is 35.99 kg/(m^2). Obese Class III  Estimated Nutritional Needs: Kcal: 2000-2200  Protein: 100-115 grams  Fluid: > 2 L/day   Skin:  L chest incision  Diet Order: Carb Control  EDUCATION NEEDS: -No education needs identified at this time   Intake/Output Summary (Last 24 hours) at 10/29/12 0915 Last data filed at 10/29/12 0800  Gross per 24 hour  Intake    370 ml  Output   1550 ml  Net  -1180 ml    Last BM: 8/27 Labs:   Recent Labs Lab 10/25/12 0435 10/26/12 0415 10/27/12 0440  NA 140 137 137  K 3.8 4.2 4.5  CL 96 95* 94*  CO2 41* 39* 39*  BUN 8 9 10   CREATININE 0.52 0.53 0.58  CALCIUM 8.5 8.6 8.9  GLUCOSE 123* 171* 155*    CBG (last 3)   Recent Labs  10/28/12 1643 10/28/12 2129 10/29/12 0627  GLUCAP 167* 170* 143*   Scheduled Meds: . ALPRAZolam  0.5 mg Oral QHS  . budesonide  0.25 mg Nebulization Q6H  . digoxin  0.25 mg Oral Daily  . diltiazem  240 mg Oral BID  . famotidine  20 mg Oral BID  . feeding supplement  237 mL Oral BID BM  . feeding  supplement  1 Container Oral Q24H  . insulin aspart  0-15 Units Subcutaneous TID WC  . insulin aspart  0-5 Units Subcutaneous QHS  . insulin aspart  3 Units Subcutaneous TID WC  . ipratropium  0.5 mg Nebulization Q6H  . levalbuterol  0.63 mg Nebulization Q6H  . metFORMIN  500 mg Oral BID WC  . rOPINIRole  1 mg Oral QHS  . sertraline  50 mg Oral Daily  . sodium chloride  10-40 mL Intracatheter Q12H  . vancomycin  125 mg Oral QID   Followed by  . [START ON 11/03/2012] vancomycin  125 mg Oral BID   Followed by  . [START ON 11/10/2012] vancomycin  125 mg Oral Daily   Followed by  . [START ON 11/17/2012] vancomycin  125 mg Oral QODAY   Followed by  . [START ON 11/25/2012] vancomycin  125 mg Oral Q3 days  . Warfarin - Pharmacist Dosing Inpatient   Does not apply q1800    Continuous Infusions: none at this time      Clarene Duke RD, LDN Pager [phone] After Hours pager [phone]

## 2012-10-30 ENCOUNTER — Telehealth: Payer: Self-pay | Admitting: Internal Medicine

## 2012-10-30 DIAGNOSIS — I469 Cardiac arrest, cause unspecified: Secondary | ICD-10-CM

## 2012-10-30 LAB — GLUCOSE, CAPILLARY

## 2012-10-30 MED ORDER — INSULIN ASPART 100 UNIT/ML ~~LOC~~ SOLN: 0.0000 [IU] | Freq: Every day | SUBCUTANEOUS | Status: DC

## 2012-10-30 MED ORDER — DIGOXIN 250 MCG PO TABS: 0.2500 mg | ORAL_TABLET | Freq: Every day | ORAL | Status: DC

## 2012-10-30 MED ORDER — LEVALBUTEROL HCL 0.63 MG/3ML IN NEBU: 0.6300 mg | INHALATION_SOLUTION | Freq: Four times a day (QID) | RESPIRATORY_TRACT | Status: DC

## 2012-10-30 MED ORDER — ENSURE COMPLETE PO LIQD: 237.0000 mL | Freq: Two times a day (BID) | ORAL | Status: DC

## 2012-10-30 MED ORDER — WARFARIN - PHARMACIST DOSING INPATIENT: Status: DC

## 2012-10-30 MED ORDER — WARFARIN SODIUM 10 MG PO TABS
10.0000 mg | ORAL_TABLET | Freq: Once | ORAL | Status: AC
  Administered 2012-10-30: 10 mg via ORAL
  Filled 2012-10-30: qty 1

## 2012-10-30 MED ORDER — ALPRAZOLAM 0.25 MG PO TABS: 0.2500 mg | ORAL_TABLET | ORAL | Status: DC | PRN

## 2012-10-30 MED ORDER — ALPRAZOLAM ER 0.5 MG PO TB24: 0.5000 mg | ORAL_TABLET | Freq: Every day | ORAL | Status: DC

## 2012-10-30 MED ORDER — TRAMADOL HCL 50 MG PO TABS: 100.0000 mg | ORAL_TABLET | Freq: Four times a day (QID) | ORAL | Status: DC | PRN

## 2012-10-30 MED ORDER — BOOST / RESOURCE BREEZE PO LIQD: 1.0000 | ORAL | Status: DC

## 2012-10-30 MED ORDER — WARFARIN SODIUM 10 MG PO TABS: 10.0000 mg | ORAL_TABLET | Freq: Once | ORAL | Status: DC

## 2012-10-30 MED ORDER — VANCOMYCIN 50 MG/ML ORAL SOLUTION: 125.0000 mg | Freq: Four times a day (QID) | ORAL | Status: DC

## 2012-10-30 MED ORDER — DILTIAZEM HCL ER COATED BEADS 240 MG PO CP24: 240.0000 mg | ORAL_CAPSULE | Freq: Two times a day (BID) | ORAL | Status: DC

## 2012-10-30 MED ORDER — LEVALBUTEROL HCL 0.63 MG/3ML IN NEBU: 0.6300 mg | INHALATION_SOLUTION | RESPIRATORY_TRACT | Status: DC | PRN

## 2012-10-30 MED ORDER — METFORMIN HCL 500 MG PO TABS: 500.0000 mg | ORAL_TABLET | Freq: Two times a day (BID) | ORAL | Status: DC

## 2012-10-30 MED ORDER — INSULIN ASPART 100 UNIT/ML ~~LOC~~ SOLN: 3.0000 [IU] | Freq: Three times a day (TID) | SUBCUTANEOUS | Status: DC

## 2012-10-30 MED ORDER — IPRATROPIUM BROMIDE 0.02 % IN SOLN: 0.5000 mg | Freq: Four times a day (QID) | RESPIRATORY_TRACT | Status: DC

## 2012-10-30 NOTE — Progress Notes (Signed)
PHARMACY FOLLOW UP CONSULT NOTE  Pharmacy Consult :  Coumadin Indication: atrial fibrillation  Dosing Weight: 98 kg  Recent Labs  10/28/12 0500 10/29/12 0435 10/30/12 0825  INR 1.47 1.45 1.63*   Lab Results  Component Value Date   INR 1.63* 10/30/2012   INR 1.45 10/29/2012   INR 1.47 10/28/2012   Estimated Creatinine Clearance: 88.8 ml/min (by C-G formula based on Cr of 0.58).  Assessment:  57 YOF on Coumadin for new onset Afib.  INR rising towards goal, but remains SUBtherapeutic at 1.63, despite increase in coumadin doses.  No bleeding complications noted.  Noted to have finished a course of fidaxomicin 8/24.  Goal of Therapy:  INR 2-3   Plan:   Repeat Coumadin 10 mg today. Daily INR's, CBC.  Monitor for bleeding complications.   Suhaas Agena L. Illene Bolus, PharmD, BCPS Clinical Pharmacist Pager: [phone] Pharmacy: [phone] 10/30/2012 12:54 PM

## 2012-10-30 NOTE — Telephone Encounter (Signed)
10-29-12 lmm @ 1000am for pt to RS cxl wound check from 10-29-12 and needs 91 day fu with taylor in nov/mt

## 2012-10-30 NOTE — Progress Notes (Signed)
[facility] took bed offer back. CSW called [place] who confirmed bed. CSW spoke with daughter who confirmed that was okay. Clinical Social Worker facilitated patient discharge by contacting the patient, family and facility, [facility]. Patient agreeable to this plan and arranging transport via EMS. CSW will sign off, as social work intervention is no longer needed.  Maree Krabbe, MSW, [employer] [employer] [phone]

## 2012-10-30 NOTE — Progress Notes (Signed)
Clinical Social Work Department CLINICAL SOCIAL WORK PLACEMENT NOTE 10/30/2012  Patient:  [name], [name]  Account Number:  [id number] Admit date:  10/09/2012  Clinical Social Worker:  Carren Rang  Date/time:  10/29/2012 01:45 PM  Clinical Social Work is seeking post-discharge placement for this patient at the following level of care:   SKILLED NURSING   (*CSW will update this form in Epic as items are completed)   10/29/2012  Patient/family provided with [facility] Department of Clinical Social Work's list of facilities offering this level of care within the geographic area requested by the patient (or if unable, by the patient's family).  10/29/2012  Patient/family informed of their freedom to choose among providers that offer the needed level of care, that participate in Medicare, Medicaid or managed care program needed by the patient, have an available bed and are willing to accept the patient.  10/29/2012  Patient/family informed of [facility]' ownership interest in [facility], as well as of the fact that they are under no obligation to receive care at this facility.  PASARR submitted to EDS on 10/29/2012 PASARR number received from EDS on 10/29/2012  FL2 transmitted to all facilities in geographic area requested by pt/family on  10/29/2012 FL2 transmitted to all facilities within larger geographic area on   Patient informed that his/her managed care company has contracts with or will negotiate with  certain facilities, including the following:     Patient/family informed of bed offers received:  10/30/2012 Patient chooses bed at [facility] Physician recommends and patient chooses bed at    Patient to be transferred to [facility] on  10/30/2012 Patient to be transferred to facility by EMS  The following physician request were entered in Epic:   Additional Comments:  Maree Krabbe, MSW, [employer] [phone]

## 2012-10-30 NOTE — Progress Notes (Deleted)
CSW confirmed bed at [facility]. Awaiting insurance auth and dc order.  Maree Krabbe, MSW, [facility] [phone]

## 2012-10-30 NOTE — Progress Notes (Signed)
CSW spoke with patient and daughter about possible SNF dc if insurance does not approve CIR. Patient referred CSW to daughter. Daughter stated she wants her mom to go to [facility] if she can not go to CIR. CSW confirmed semi private bed at [facility] when medically ready to be dc. Awaiting [employer] auth.  Maree Krabbe, MSW, [facility] [phone]

## 2012-10-30 NOTE — Progress Notes (Signed)
Patient: [name] Date of Encounter: 10/30/2012, 7:53 AM Admit date: 10/09/2012     Subjective  [name] feels she is slowly improving. She continues to have DOE. She denies CP or palpitations. A lttle stronger today   Objective  Physical Exam: Vitals: BP 105/68  Pulse 69  Temp(Src) 97 F (36.1 C) (Oral)  Resp 18  Ht 5\' 5"  (1.651 m)  Wt 216 lb 4.3 oz (98.1 kg)  BMI 35.99 kg/m2  SpO2 90% General: Well developed, overweight 57 year old female in no acute distress. Neck: Supple. J  Lungs: Clear bilaterally to auscultation without wheezes, rales, or rhonchi. Breathing is unlabored. Heart: Irregular S1 S2 without murmur, rub or gallop.  Abdomen: Soft, non-distended. Extremities: No clubbing or cyanosis. No edema.   Neuro: Alert and oriented X 3. Moves all extremities spontaneously. No focal deficits. Skin: Left upper chest / implant site intact with bleeding, hematoma, erythema, edema, warmth or drainage. Desquamating finger tips  Intake/Output:  Intake/Output Summary (Last 24 hours) at 10/30/12 0753 Last data filed at 10/29/12 2237  Gross per 24 hour  Intake    480 ml  Output   1251 ml  Net   -771 ml    Inpatient Medications:  . ALPRAZolam  0.5 mg Oral QHS  . budesonide  0.25 mg Nebulization Q6H  . digoxin  0.25 mg Oral Daily  . diltiazem  240 mg Oral BID  . famotidine  20 mg Oral BID  . feeding supplement  237 mL Oral BID BM  . feeding supplement  1 Container Oral Q24H  . insulin aspart  0-15 Units Subcutaneous TID WC  . insulin aspart  0-5 Units Subcutaneous QHS  . insulin aspart  3 Units Subcutaneous TID WC  . ipratropium  0.5 mg Nebulization Q6H  . levalbuterol  0.63 mg Nebulization Q6H  . metFORMIN  500 mg Oral BID WC  . rOPINIRole  1 mg Oral QHS  . sertraline  50 mg Oral Daily  . sodium chloride  10-40 mL Intracatheter Q12H  . vancomycin  125 mg Oral QID   Followed by  . [START ON 11/03/2012] vancomycin  125 mg Oral BID   Followed by  . [START  ON 11/10/2012] vancomycin  125 mg Oral Daily   Followed by  . [START ON 11/17/2012] vancomycin  125 mg Oral QODAY   Followed by  . [START ON 11/25/2012] vancomycin  125 mg Oral Q3 days  . Warfarin - Pharmacist Dosing Inpatient   Does not apply q1800    Labs: No results found for this basename: NA, K, CL, CO2, GLUCOSE, BUN, CREATININE, CALCIUM, MG, PHOS,  in the last 72 hours No results found for this basename: WBC, NEUTROABS, HGB, HCT, MCV, PLT,  in the last 72 hours No results found for this basename: TSH, T4TOTAL, FREET3, T3FREE, THYROIDAB,  in the last 72 hours  Recent Labs  10/29/12 0435  INR 1.45    Radiology/Studies: Dg Chest Port 1 View 10/25/2012   *RADIOLOGY REPORT*  Clinical Data: Respiratory failure  PORTABLE CHEST - 1 VIEW  Comparison: 10/23/2012  Findings: Left subclavian pacemaker and right arm PICC stable in position.  Probable layering right pleural effusion with right lower lung atelectasis/consolidation.  The left infrahilar consolidation / atelectasis is stable.  Mild cardiomegaly.  IMPRESSION:  Stable appearance since previous   exam.   Original Report Authenticated By: D. Andria Rhein, MD   Dg Chest Port 1 View 10/23/2012   *RADIOLOGY  REPORT*  Clinical Data: Dyspnea  PORTABLE CHEST - 1 VIEW  Comparison:  October 22, 2012  Findings: There is persistent consolidation in portions of the right middle and lower lobes with right effusion.  Left lung is clear.  Heart is upper normal in size with normal pulmonary vascularity.  Pacemaker leads were attached to the right atrium and right ventricle.  Central catheter tip is at the cavoatrial junction level.  No pneumothorax.  IMPRESSION: No pneumothorax.  Persistent right effusion with consolidation in portions of the right middle and lower lobes.   Original Report Authenticated By: Bretta Bang, M.D.    Echocardiogram: 10/16/2012 - Left ventricle: The LV contractility pattern was somehwat dyssynchronous because of the bundle  branch block. The cavity size was normal. Wall thickness was normal. Systolic function was normal. The estimated ejection fraction was in the range of 50% to 55%. - Aortic valve: Structurally normal valve. Cusp separation was normal. Doppler: Transvalvular velocity was within the normal range. There was no stenosis. No regurgitation. - Aorta: Aortic root: The aortic root was normal in size. Ascending aorta: The ascending aorta was normal in size. - Mitral valve: Structurally normal valve. Leaflet separation was normal. Doppler: Transvalvular velocity was within the normal range. There was no evidence for stenosis. No regurgitation. - Left atrium: The atrium was normal in size. - Right ventricle: The cavity size was normal. Systolic function was normal. - Pulmonic valve: Doppler: No significant regurgitation. - Tricuspid valve: Doppler: Mild regurgitation. - Pulmonary artery: Systolic pressure was within the normal range. - Right atrium: The atrium was normal in size. - Pericardium: There was no pericardial effusion.   Telemetry: atrial flutter, V rate 70 bpm    Assessment and Plan  1. Intermittent complete heart block  > asystole x 30sec 2. S/p pacer   2. Atrial fibrillation / flutter  - newly diagnosed  - atria sizes are normal so suspect AF burden is low - V rates mostly controlled although increase to 130s with exertion so diltiazem increased yesterday with improved rates; continue digoxin - continue warfarin for embolic prophylaxis     Needs dig level Would anticpate cardioversion after therapeutic for 3 weeks

## 2012-10-30 NOTE — Progress Notes (Signed)
Physical Therapy Treatment Patient Details Name: [name] MRN: [id number] DOB: 1955-12-31 Today's Date: 10/30/2012 Time: 4098-1191 PT Time Calculation (min): 25 min  PT Assessment / Plan / Recommendation  History of Present Illness This is a 57 y/o female who presented on 8/7 with confusion, diarrhea, abd pain. She was hospitalized last December with ARDS from influenza and probable secondary pneumococcal pneumonia. For the tail end of her admission (in Jan) she developed C. difficile colitis. She was transferred to [facility] for ventilator weaning and rehab. She received a full course of treatment but continued to have diarrhea after she was discharged home (2/14). She returned to the hospital on 7/8 for diarrhea and hypotension- transferred to ICU on 7/9 for persisent hypotension and resp failure - intubated and placed on pressors. Found to have C diff colitis again. She was re-admitted on 7/28, treated again for C Diff and discharged on 8/2. This time had social services arrange outpt Vanc. She was re-admitted on 8/7 for diarrhea and hypotension. Pacemaker on 10/16/2012. Transferred back to ICU 8/17 with hypercarbic resp failure. Notably, had received multiple doses of opioid analgesics and had been refusing nocturnal CPAP for 2-3 days prior to transfer back to ICU   PT Comments   Patient continues to demonstrate deficits in functional mobility as indicated below. Patient was able to tolerated minimal ambulation with se of RW but still requires assist and O2 supplemental support. Patient also complains of increased low back pain.  Pt tolerated lumbar stabilization well and provided lumbar towel roll for support and comfort. Will continue to see as indicated and progress activity as tolerated.   Follow Up Recommendations  CIR;Supervision/Assistance - 24 hour           Equipment Recommendations  None recommended by PT    Recommendations for Other Services OT consult  Frequency Min  3X/week   Progress towards PT Goals Progress towards PT goals: Progressing toward goals  Plan Current plan remains appropriate    Precautions / Restrictions Precautions Precautions: Fall Precaution Comments: contact--brown,  pacemaker  Restrictions Weight Bearing Restrictions: No Other Position/Activity Restrictions: pacemaker precautions   Pertinent Vitals/Pain 6/10 Low Back pain, SpO2 on 4 liters at rest 97%, during ambulation decreased to 89% on 4 liters    Mobility  Bed Mobility Bed Mobility: Right Sidelying to Sit;Sit to Supine Right Sidelying to Sit: 4: Min assist Sitting - Scoot to Edge of Bed: 6: Modified independent (Device/Increase time) Sit to Supine: 4: Min assist Details for Bed Mobility Assistance: vc to not pull/push with LUE Transfers Transfers: Sit to Stand;Stand to Sit;Stand Pivot Transfers Sit to Stand: 4: Min assist;From bed Stand to Sit: 4: Min Multimedia programmer Transfers: 4: Min assist Details for Transfer Assistance: VCs for rocker techniques wth momentum, assist for stability  Ambulation/Gait Ambulation/Gait Assistance: 4: Min assist;3: Mod assist Ambulation Distance (Feet): 40 Feet (multiple rest breaks, SpO2 decreased to 89% on 4 liters) Assistive device: Rolling walker Ambulation/Gait Assistance Details: Assist for stability and management of device, Cues for upright posture and positioning Gait Pattern: Step-through pattern;Decreased stride length Gait velocity: decreased    Exercises Other Exercises Other Exercises: encouraged use of IS Other Exercises: assisted patient with lumbar stabilizaton Other Exercises: provided patient with lumbar support towel for comfort   PT Diagnosis:    PT Problem List:   PT Treatment Interventions:     PT Goals (current goals can now be found in the care plan section) Acute Rehab PT Goals Patient Stated Goal: to  go home and be stronger PT Goal Formulation: With patient Time For Goal Achievement:  10/31/12 Potential to Achieve Goals: Good  Visit Information  Last PT Received On: 10/30/12 Assistance Needed: +1 History of Present Illness: This is a 57 y/o female who presented on 8/7 with confusion, diarrhea, abd pain. She was hospitalized last December with ARDS from influenza and probable secondary pneumococcal pneumonia. For the tail end of her admission (in Jan) she developed C. difficile colitis. She was transferred to [facility] for ventilator weaning and rehab. She received a full course of treatment but continued to have diarrhea after she was discharged home (2/14). She returned to the hospital on 7/8 for diarrhea and hypotension- transferred to ICU on 7/9 for persisent hypotension and resp failure - intubated and placed on pressors. Found to have C diff colitis again. She was re-admitted on 7/28, treated again for C Diff and discharged on 8/2. This time had social services arrange outpt Vanc. She was re-admitted on 8/7 for diarrhea and hypotension. Pacemaker on 10/16/2012. Transferred back to ICU 8/17 with hypercarbic resp failure. Notably, had received multiple doses of opioid analgesics and had been refusing nocturnal CPAP for 2-3 days prior to transfer back to ICU    Subjective Data  Subjective: My back really hurts Patient Stated Goal: to go home and be stronger   Cognition  Cognition Arousal/Alertness: Awake/alert Behavior During Therapy: WFL for tasks assessed/performed Overall Cognitive Status: Within Functional Limits for tasks assessed    Balance  Static Standing Balance Static Standing - Balance Support: Bilateral upper extremity supported Static Standing - Level of Assistance: 6: Modified independent (Device/Increase time) Static Standing - Comment/# of Minutes: 4 minutes with rest breaks  End of Session PT - End of Session Equipment Utilized During Treatment: Gait belt;Oxygen Activity Tolerance: Patient limited by fatigue Patient left: in bed;with call bell/phone within  reach Nurse Communication: Mobility status   GP     Fabio Asa 10/30/2012, 9:41 AM Charlotte Crumb, PT DPT  [phone]

## 2012-10-30 NOTE — Progress Notes (Signed)
Pt's insurance denies CIR-will cover SNF.  Pt's CM, Raynelle Fanning, notified.  CIR will sign off.  [phone]

## 2012-10-30 NOTE — Discharge Summary (Signed)
Physician Discharge Summary     Patient ID: GIRTIE WIERSMA MRN: [id number] DOB/AGE: 1955/12/14 57 y.o.  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 Found to be hypotensive with AKI. BP improved with IVF in ED, PCCM asked to evaluate for admission. Notably, on 7/28 she was admitted with identical symptoms and was discharged on 8/2 with instructions to take oral vanc and flagyl. It is unclear to me if she took these. She has a history of noncompliance in the past. While in the ED she became more hypercarbic. She was admitted to the ICU, treated w/ IV hydration and empiric oral vanc and flagyl. After admit she again became hypotensive. Required aggressive volume resuscitation and pressor support. Her renal function improve. She continued to have diarrhea but her cdiff PCR was negative. We had ID follow in consultation. 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 She improved with all these treatments.   She was transferred to the internal medicine service on 8/11. She developed atrial fib/flutter on 8/12 and cardiology was consulted. Amiodarone was started. On 8/13 she had 50 sec episode of ventricular stand-still with loss of consciousness. Because of this EP was asked to see. She was felt to have tachy/brady syndrome and episode of complete heart block. Amiodarone and beta-blockers were stopped. She was moved to the CICU for further evaluation. On 8/14 she had permanent pacemaker placed (DDD PM, placed via left [place] vein. On 8/17 she had since moved out of the ICU and that day had Rapid response call for decreased LOC. Transferred back to the ICU for what was felt to be hypercarbic respiratory failure in  the setting of opioids, and volume excess, further complicated by pt refusing to use CPAP.   Treatment at this point included; stopping narcotics and aggressive diuresis. Her status improved with these measures. She had been almost ready to transfer out of the ICU again when on 8/20 she developed acute respiratory failure due to dead space breathing from anxiety attack in setting of pulmonary edema. This improved with BIPAP. On 8/24 she was moved to the med/surg floor. After her prolonged illness we felt she would need extensive help after discharge. Rehab service evaluated her. Her insurance denied CIR even though she was approved. She is now stable for d/c to SNF for rehab services.   Discharge Plan by active diagnoses  Chronic respiratory failure in setting of COPD OHS/OSA  R pleural effusion and Mild pulmonary edema  Acute hypercarbic resp failure due to opiates - resolved  ? Subglottic obst clinically 8/24  P:  Cont nocturnal BiPAP with nasal mask and autoset  Cont PRN BDs  Cont to avoid ALL opioids Follow up with [facility] after d/c from SNF  Atrial fib/flutter, rate better controlled  S/p PPM 8/14 - placed because of prolonged pauses  P:  Cont diltiazem (dose increased 8/26)  Cont digoxin (initiated 8/22)  Cont warfarin per pharmacy Needs follow up with Dr Ladona Ridgel in November  Met alk due to loop diuretics/compensation for chronic resp failure  P:  Monitor BMET intermittently  Correct electrolytes as indicated  Obesity  chronic colitis  P:  Cont CHO mod diet (begun 8/19)   Mild anemia, no  evidence of acute blood loss  P:  -follow CBC intermittently  Hx Recurrent C.diff  P:  Cont enteral vanc as ordered-->taper  She has f/u with infectious disease   DM2  P:  Cont ACHS SSI  Cont metformin - initiated 8/25  Acute enceph due to hypercarbia , resolved  Chronic pain syndrome, controlled  Restless leg syndrome  Anxiety D/O, controlled  P:  minimize sedating  meds  Cont Tramadol for pain  Cont requip for restless leg  Cont daily long acting alprazolam (initiated 8/26)  Cont low dose PRN alprazolam    Significant Hospital tests/ studies/ interventions and procedures  LINES / TUBES:  R IJ CVL 8/7 >> 8/19  PICC 8/19 >> 8/27   CULTURES:  Blood 8/7 >> Neg  Urine 8/7 >> Neg  C diff 8/8 >> Neg   ANTIBIOTICS:  flagyl 8/7 >> 8/11  fidaxomicin 8/13 >> 8/25  Enteral vanc (taper) 8/25 >>   Consults Cardiology In-patient rehab EP ID   Discharge Exam: BP 110/64  Pulse 68  Temp(Src) 97.2 F (36.2 C) (Oral)  Resp 18  Ht 5\' 5"  (1.651 m)  Wt 98.1 kg (216 lb 4.3 oz)  BMI 35.99 kg/m2  SpO2 94% 4 liters  PHYSICAL EXAMINATION:  Gen: NAD  HEENT: WNL  PULM: distant bs, no wheeze  CV: Tachy, irreg, no M noted  AB: obese, soft, NT, NABS  Ext: edema resolved  Neuro: no focal deficits   Labs at discharge Lab Results  Component Value Date   CREATININE 0.58 10/27/2012   BUN 10 10/27/2012   NA 137 10/27/2012   K 4.5 10/27/2012   CL 94* 10/27/2012   CO2 39* 10/27/2012   Lab Results  Component Value Date   WBC 8.3 10/27/2012   HGB 8.7* 10/27/2012   HCT 29.2* 10/27/2012   MCV 93.3 10/27/2012   PLT 272 10/27/2012   Lab Results  Component Value Date   ALT 9 10/16/2012   AST 9 10/16/2012   ALKPHOS 51 10/16/2012   BILITOT 0.1* 10/16/2012   Lab Results  Component Value Date   INR 1.63* 10/30/2012   INR 1.45 10/29/2012   INR 1.47 10/28/2012    Current radiology studies No results found.  Disposition:  01-Home or Self Care      Discharge Orders   Future Appointments Provider Department Dept Phone   11/12/2012 2:00 PM Judyann Munson, MD [facility] for Infectious Disease [phone]   Future Orders Complete By Expires   Diet - low sodium heart healthy  As directed    Increase activity slowly  As directed        Medication List    STOP taking these medications       aspirin 81 MG chewable tablet     diclofenac  sodium 1 % Gel  Commonly known as:  VOLTAREN     furosemide 40 MG tablet  Commonly known as:  LASIX     gabapentin 100 MG capsule  Commonly known as:  NEURONTIN     glimepiride 2 MG tablet  Commonly known as:  AMARYL     HYDROcodone-acetaminophen 7.5-325 MG per tablet  Commonly known as:  NORCO     insulin glargine 100 UNIT/ML injection  Commonly known as:  LANTUS     ipratropium-albuterol 0.5-2.5 (3) MG/3ML Soln  Commonly known as:  DUONEB     isosorbide mononitrate 15 mg Tb24 24 hr tablet  Commonly known as:  IMDUR  metroNIDAZOLE 500 MG tablet  Commonly known as:  FLAGYL      TAKE these medications       acetaminophen 325 MG tablet  Commonly known as:  TYLENOL  Take 650 mg by mouth every 6 (six) hours as needed for pain.     ALPRAZolam 0.5 MG 24 hr tablet  Commonly known as:  XANAX XR  Take 1 tablet (0.5 mg total) by mouth at bedtime.     ALPRAZolam 0.25 MG tablet  Commonly known as:  XANAX  Take 1 tablet (0.25 mg total) by mouth every 4 (four) hours as needed for anxiety.     budesonide 0.25 MG/2ML nebulizer solution  Commonly known as:  PULMICORT  Take 2 mLs (0.25 mg total) by nebulization 2 (two) times daily.     digoxin 0.25 MG tablet  Commonly known as:  LANOXIN  Take 1 tablet (0.25 mg total) by mouth daily.     diltiazem 240 MG 24 hr capsule  Commonly known as:  CARDIZEM CD  Take 1 capsule (240 mg total) by mouth 2 (two) times daily.     famotidine 20 MG tablet  Commonly known as:  PEPCID  Take 1 tablet (20 mg total) by mouth at bedtime.     feeding supplement Liqd  Take 237 mLs by mouth 2 (two) times daily between meals.     feeding supplement Liqd  Take 1 Container by mouth daily.     insulin aspart 100 UNIT/ML injection  Commonly known as:  novoLOG  Inject 0-5 Units into the skin at bedtime.     insulin aspart 100 UNIT/ML injection  Commonly known as:  novoLOG  Inject 3 Units into the skin 3 (three) times daily with meals.      insulin aspart 100 UNIT/ML injection  Commonly known as:  novoLOG  - Inject 0-8 Units into the skin 3 (three) times daily with meals. On sliding scale:  -  0-100 0 units  - 101-150 take 3 units  - 151-200 take 5 units  - 201>300 take 8 units  - 400 or higher call MD     ipratropium 0.02 % nebulizer solution  Commonly known as:  ATROVENT  Take 2.5 mLs (0.5 mg total) by nebulization every 6 (six) hours.     levalbuterol 0.63 MG/3ML nebulizer solution  Commonly known as:  XOPENEX  Take 3 mLs (0.63 mg total) by nebulization every 3 (three) hours as needed for wheezing or shortness of breath.     levalbuterol 0.63 MG/3ML nebulizer solution  Commonly known as:  XOPENEX  Take 3 mLs (0.63 mg total) by nebulization every 6 (six) hours.     metFORMIN 500 MG tablet  Commonly known as:  GLUCOPHAGE  Take 1 tablet (500 mg total) by mouth 2 (two) times daily with a meal.     rOPINIRole 1 MG tablet  Commonly known as:  REQUIP  Take 1 mg by mouth at bedtime.     sertraline 50 MG tablet  Commonly known as:  ZOLOFT  Take 50 mg by mouth daily.     traMADol 50 MG tablet  Commonly known as:  ULTRAM  Take 2 tablets (100 mg total) by mouth every 6 (six) hours as needed.     vancomycin 50 mg/mL oral solution  Commonly known as:  VANCOCIN  Take 2.5 mLs (125 mg total) by mouth 4 (four) times daily.     Warfarin - Pharmacist Dosing Inpatient Misc  Will need INR follow  up for dose adjust     warfarin 10 MG tablet  Commonly known as:  COUMADIN  Take 1 tablet (10 mg total) by mouth one time only at 6 PM.       Follow-up Information   Follow up with [facility]. (Office will call for an appointment date & time for follow-up pacemaker clinic in 7-10 days. )    Contact information:   [address] [place] [address]       Follow up with  [facility] In 3 months. (Follow-up with electrophysiology (cardiology doctor for pacemaker). Office will schedule this and  coordinate date & time with you. )    Contact information:   [address] [place] [address]       Schedule an appointment as soon as possible for a visit with Oretha Milch., MD. (after d/c)    Specialty:  Pulmonary Disease   Contact information:   [address] [place] [address] [phone]       Follow up with Judyann Munson, MD On 11/12/2012. (2pm )    Specialty:  Infectious Diseases   Contact information:   [address] [place] [address] [phone]       Discharged Condition: fair  Physician Statement:   The Patient was personally examined, the discharge assessment and plan has been personally reviewed and I agree with ACNP Babcock's assessment and plan. > 30 minutes of time have been dedicated to discharge assessment, planning and discharge instructions.   Signed: BABCOCK,PETE 10/30/2012, 2:13 PM  I have interviewed and examined the patient and reviewed the database. I have formulated the assessment and plan as reflected in the note above with amendments made by me. She has made great strides over past week and should have a good prognosis with effective rehab  Billy Fischer, MD;  PCCM service; Mobile [phone]

## 2012-10-31 ENCOUNTER — Non-Acute Institutional Stay (SKILLED_NURSING_FACILITY): Payer: Medicare HMO | Admitting: Adult Health

## 2012-10-31 ENCOUNTER — Other Ambulatory Visit: Payer: Self-pay | Admitting: *Deleted

## 2012-10-31 DIAGNOSIS — G2581 Restless legs syndrome: Secondary | ICD-10-CM

## 2012-10-31 DIAGNOSIS — E1169 Type 2 diabetes mellitus with other specified complication: Secondary | ICD-10-CM

## 2012-10-31 DIAGNOSIS — M549 Dorsalgia, unspecified: Secondary | ICD-10-CM

## 2012-10-31 DIAGNOSIS — E119 Type 2 diabetes mellitus without complications: Secondary | ICD-10-CM

## 2012-10-31 DIAGNOSIS — A0471 Enterocolitis due to Clostridium difficile, recurrent: Secondary | ICD-10-CM

## 2012-10-31 DIAGNOSIS — E669 Obesity, unspecified: Secondary | ICD-10-CM

## 2012-10-31 DIAGNOSIS — F411 Generalized anxiety disorder: Secondary | ICD-10-CM

## 2012-10-31 DIAGNOSIS — A0472 Enterocolitis due to Clostridium difficile, not specified as recurrent: Secondary | ICD-10-CM

## 2012-10-31 DIAGNOSIS — I10 Essential (primary) hypertension: Secondary | ICD-10-CM

## 2012-10-31 DIAGNOSIS — J449 Chronic obstructive pulmonary disease, unspecified: Secondary | ICD-10-CM

## 2012-10-31 DIAGNOSIS — I4891 Unspecified atrial fibrillation: Secondary | ICD-10-CM

## 2012-10-31 MED ORDER — TRAMADOL HCL 50 MG PO TABS: 100.0000 mg | ORAL_TABLET | Freq: Four times a day (QID) | ORAL | Status: DC | PRN

## 2012-10-31 MED ORDER — ALPRAZOLAM ER 0.5 MG PO TB24: 0.5000 mg | ORAL_TABLET | Freq: Every day | ORAL | Status: DC

## 2012-10-31 MED ORDER — ALPRAZOLAM 0.25 MG PO TABS: 0.2500 mg | ORAL_TABLET | ORAL | Status: DC | PRN

## 2012-11-04 ENCOUNTER — Other Ambulatory Visit: Payer: Self-pay | Admitting: *Deleted

## 2012-11-04 MED ORDER — OXYCODONE-ACETAMINOPHEN 5-325 MG PO TABS: ORAL_TABLET | ORAL | Status: DC

## 2012-11-05 ENCOUNTER — Ambulatory Visit: Payer: Medicare HMO | Admitting: Adult Health

## 2012-11-05 ENCOUNTER — Non-Acute Institutional Stay (SKILLED_NURSING_FACILITY): Payer: Medicare HMO | Admitting: Internal Medicine

## 2012-11-05 DIAGNOSIS — J441 Chronic obstructive pulmonary disease with (acute) exacerbation: Secondary | ICD-10-CM

## 2012-11-05 DIAGNOSIS — R300 Dysuria: Secondary | ICD-10-CM

## 2012-11-05 DIAGNOSIS — A0472 Enterocolitis due to Clostridium difficile, not specified as recurrent: Secondary | ICD-10-CM

## 2012-11-07 ENCOUNTER — Ambulatory Visit (INDEPENDENT_AMBULATORY_CARE_PROVIDER_SITE_OTHER): Payer: Medicare HMO | Admitting: Adult Health

## 2012-11-07 ENCOUNTER — Encounter: Payer: Self-pay | Admitting: Adult Health

## 2012-11-07 ENCOUNTER — Ambulatory Visit (INDEPENDENT_AMBULATORY_CARE_PROVIDER_SITE_OTHER)
Admission: RE | Admit: 2012-11-07 | Discharge: 2012-11-07 | Disposition: A | Discharge status: Home / Self Care | Payer: Medicare HMO | Source: Ambulatory Visit | Attending: Adult Health | Admitting: Adult Health

## 2012-11-07 VITALS — BP 132/66 | HR 70 | Temp 98.6°F | Ht 66.5 in | Wt 192.6 lb

## 2012-11-07 DIAGNOSIS — I469 Cardiac arrest, cause unspecified: Secondary | ICD-10-CM

## 2012-11-07 DIAGNOSIS — A0472 Enterocolitis due to Clostridium difficile, not specified as recurrent: Secondary | ICD-10-CM

## 2012-11-07 DIAGNOSIS — G8929 Other chronic pain: Secondary | ICD-10-CM

## 2012-11-07 DIAGNOSIS — J449 Chronic obstructive pulmonary disease, unspecified: Secondary | ICD-10-CM

## 2012-11-07 DIAGNOSIS — A0471 Enterocolitis due to Clostridium difficile, recurrent: Secondary | ICD-10-CM

## 2012-11-07 NOTE — Assessment & Plan Note (Addendum)
Compensated on present regimen  Check cxr today   Plan  Continue on Budesonide Neb Twice daily  And Xopenex Neb Four times a day  .  follow up Dr. Vassie Loll  In 6 weeks and As needed   I will call with chest xray results.

## 2012-11-07 NOTE — Progress Notes (Addendum)
Patient ID: [name], female   DOB: 28-Jan-1956, 57 y.o.   MRN: [id number]           HISTORY & PHYSICAL  DATE:  11/04/2012  FACILITY: [facility]    LEVEL OF CARE:   SNF   CHIEF COMPLAINT:  This is an admission to SNF, post stay at [facility], 10/09/2012 through 10/30/2012.    HISTORY OF PRESENT ILLNESS:  This is a 57 year-old woman who was admitted on 10/09/2012 with confusion, diarrhea, chills, and abdominal pain.  She was found to be hypotensive with acute renal failure.  She was given fluid resuscitation.  She had been admitted on 09/29/2012 with the same symptoms.  She was found to have pseudomembranous colitis and was instructed to take oral vanc and Flagyl.  Her compliance with this was unclear.  While in the emergency room, she became hypercarbic.  She was admitted to the ICU, treated with IV hydration and empiric oral vanc and Flagyl.  She required aggressive fluid volume support.  Her renal function improved, but she continued to have diarrhea.  Her C.diff PCR, however, was negative.  She was seen in consultation by ID.  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  She improved with all these treatments.    Other medical issues included atrial fib/flutter on 10/13/2012.  She was seen by Cardiology and started on amiodarone.  She had a 50-second episode of ventricular standstill with loss of consciousness.  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  The amiodarone and beta blockers were stopped.  She had a permanent pacemaker (DDD) via the left subclavian vein.  Subsequently, she was sent back to the ICU for hypercarbic  respiratory failure, felt to be secondary to opiates.       She seemed to improve after that.  She was out of the ICU on August 20th.  Again, she developed acute  respiratory failure from pulmonary edema.  She required BiPAP.  She was   apparently denied by her insurance for inpatient rehab.    PAST MEDICAL HISTORY/PROBLEM LIST:  Chronic  respiratory failure in the setting of COPD.    Obesity hypoventilation syndrome with obstructive sleep apnea.  She has been on oxygen for a year.    Right pleural effusion and mild pulmonary edema.    Acute hypercarbic  respiratory failure secondary to opiates.    Atrial fib/flutter with sick sinus syndrome, now with a permanent pacemaker.   She is on Coumadin.    Recurrent pseudomembranous colitis, followed up with Infectious Disease even though her C.diff PCR was negative.    Metabolic alkalosis secondary to loop diuretics in compensation for chronic  respiratory failure.  Recommendations for DMET intermittently.    Obesity.    Mild anemia.    Type 2 diabetes.    Restless legs syndrome.    CURRENT MEDICATIONS:  Discharge medications include:      Xanax XR, 1 tablet by mouth at bedtime.   Regular Xanax 0.25 q.4 p.r.n.    Pulmicort 0.25 by nebulizers two times daily.    Digoxin 0.25 daily.   Diltiazem 240 by mouth b.i.d.    Pepcid 20 mg daily.    NovoLog insulin sliding scale.    Atrovent nebulizers q.6.    Xopenex nebulizers 0.63 mg q.6 and q.3 p.r.n.     Metformin, 1 tablet at 500  mg b.i.d.    Requip 1 mg at bedtime.    Zoloft 50 mg daily.    Ultram 100 mg q.6 p.r.n.    Vancomycin 50 mg/mL, 2.5 mL by mouth four times daily.    Coumadin 10 mg once daily at 6 p.m.    SOCIAL HISTORY: HOUSING:  The patient lives in [place].  She lives with her husband.   FUNCTIONAL STATUS:  I am not exactly sure of her functional level.    REVIEW OF SYSTEMS:   CHEST/RESPIRATORY:  She feels this is stable, at her baseline level of shortness of breath on exertion.    She does mention she is on chronic oxygen.   CARDIAC:   No clear chest pain or palpitations.  Recent pacemaker placement noted.   GI:  States she is having formed stool.  Therefore, no evidence of  current diarrhea.  She is complaining of left lower greater than right lower quadrant abdominal pain.   GU:  She is complaining of hurting when she pees and bilateral flank pain, although a urinalysis from August 30th showed no bacteria, 3-6 white cells, rare yeast.   MUSCULOSKELETAL:  She is complaining of central low back pain which began about seven days ago.  She thinks she has a UTI.    PHYSICAL EXAMINATION:   VITAL SIGNS:   O2 SATURATIONS:  97% on 2 L.   PULSE:  82.   RESPIRATIONS:  18.   GENERAL APPEARANCE:  The patient looks chronically ill, but better than what I was expecting in terms of her function.   HEENT:   MOUTH/THROAT:   No oral lesions are seen.   CHEST/RESPIRATORY:  Very shallow air entry, but no crackles or wheezes.  Her work of breathing is normal.   CARDIOVASCULAR:  CARDIAC:   Heart sounds are normal.  No evidence of right or left ventricular failure.   GASTROINTESTINAL:  ABDOMEN:   Somewhat obese.  No masses.  She does have very significant left greater than right lower quadrant tenderness, although her bowel sounds are positive.    LIVER/SPLEEN/KIDNEYS:  No liver, no spleen.   GENITOURINARY:  BLADDER:   Bladder distention is not noted.  However, there is left greater than right CVA tenderness.  MUSCULOSKELETAL:   BACK:  There is indeed central low back pain which she says has been there for seven days.     NEUROLOGICAL:    SENSATION/STRENGTH:  Surprisingly good strength in her lower extremities at 4/5 hip flexion and abduction.   DEEP TENDON REFLEXES:  Reflexes are present.  Toes are downgoing.   BALANCE/GAIT:  She is able to bring herself from a sitting to a standing position and walk with her walker.   PSYCHIATRIC:   MENTAL STATUS:   I see no overt abnormalities.    ASSESSMENT/PLAN:  Severe COPD with recurrent  respiratory failure.   Currently on oxygen.  She seems remarkably stable at the moment.    Recurrent pseudomembranous colitis.  Now on vancomycin.   However, I am really uncertain how long this is supposed to be continued and whether there was a feeling that this needed to be tapered over the next 4-6 weeks.  This is sometimes done.  As mentioned, she did receive a course of fidaxomicin.   Type 2 diabetes.  On Glucophage.    Complaints of dysuria.  However, her urinalysis looks completely normal.  I would like to see the culture result before I initiate antibiotic therapy, which could resume  her pseudomembranous colitis.    Low back pain.  At least partially, I think this is mechanical, although I am not doubting that it is possible that she has a budding pyelonephritis, as well.  Again, I would like to see the urine culture.    Restless legs syndrome.  On Requip.    Depression.  On Zoloft.    History of sick sinus syndrome.  Now status post pacemaker placement with Coumadin.    This woman will need lab work.  I am awaiting her urine culture before considering what to do about the issue of dysuria.  Patients with pseudomembranous colitis are at extreme risk for recurrence with unnecessary antibiotic therapy.  I will have the nurses research this.  CPT CODE: 16109  ADDENDUM:  Her urine culture shows 40,000 multiple bacterial morphocytes, not indicative of pyelonephritis.  I suspect her pain, therefore, is mechanical.

## 2012-11-07 NOTE — Assessment & Plan Note (Signed)
Advised to follow up with Dr. Leanord Hawking regarding back pain  Advised unable to write for narcotic rx as she was given percocet by Dr. Leanord Hawking.

## 2012-11-07 NOTE — Progress Notes (Signed)
Subjective:    Patient ID: [name], female    DOB: 01/08/1956, 57 y.o.   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 She reports sporadic compliance with this.  She has cut down form 1.5 to 1 PPD. She is unable to afford medciations bt has been getting a supply form her mother of symbicort & singulair. She denies heartburn, sinus drainage, seasonal allergies or excessive daytime somnolence. She wants a letter for disability.  >>smoking cessation, cont O2     05/15/12   86% 3L on arrival-- pt reports "good days and bad days" with breathing-- trach removed x1 month ago, G Tube removed x2 wks - discharge stopped c/o diarrhea  since hospital d/c - C diff POS  -PCP treating with flagyl & ? Po vanc C/o chest pain x 5 mins -EKG -unchanged RBBB  Admitted 02/29/12 w/ H1N1 w/ ARDS complicated by secondary pneumococcal PNA . She had a prolonged course w/ tracheostomy . 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 Requried transfer to [facility] for weaning. Was sucessfully liberated from vent and decannulated 04/14/12.  Discharged on prednisone taper.  And Advair and As needed Ventolin.  Has been recommended for OP sleep evaluation when ready. (once trach site heals)  Since discharge she is feeling very weak.  Wearing O2 at 3L/m  Trach site is doing well , no redness, no drainage. No fever.  Has not restarted smoking -  Family smokes, but outside.   G-Tube (placed IR 1/9 )  removed.  currenlty on reg diet. No trouble w/ swallowing.  Good appetite and no n/v/d .  Lost 30lbs during  admission.  Has diffused muscle weakness.  On PT at home. >> cont ,  advair , and dounebs 4 times/daily    11/07/2012 Follow up  Admitted 8/7-28 for recurrent C.diff with confusion, diarrhea, subjective fevers, chills, and abdominal pain. Found to be hypotensive with AKI.  While in the ED she became more hypercarbic. She was admitted to the ICU, treated w/ IV hydration and empiric oral vanc and flagyl. After admit she again became hypotensive. Required aggressive volume resuscitation and pressor support. Her renal function improve. She continued to have diarrhea but her cdiff PCR was negative. We had ID follow in consultation. 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   She was transferred to the internal medicine service on 8/11. She developed atrial fib/flutter on 8/12 and cardiology was consulted. Amiodarone was started. On 8/13 she had 50 sec episode of ventricular stand-still with loss of consciousness. Because of this EP was asked to see. She was felt to have tachy/brady syndrome and episode of complete heart block. Amiodarone and beta-blockers were stopped. She was moved to the CICU for further evaluation. On 8/14 she had permanent pacemaker placed (DDD PM, placed via left [place] vein. On 8/17 she had since moved out of the ICU and that day had Rapid response call for decreased LOC. Transferred back to the  ICU for what was felt to be hypercarbic respiratory failure in the setting of opioids, and volume excess, further complicated by pt refusing to use CPAP.  Treatment at this point included; stopping narcotics and aggressive diuresis. Her status improved with these measures. She had been almost ready to transfer out of the ICU again when on 8/20 she developed acute respiratory failure due to dead space breathing from anxiety attack in setting of pulmonary edema. This improved with BIPAP. On 8/24 she was moved  to the med/surg floor. After her prolonged illness we felt she would need extensive help after discharge.  Discharged to [facility]. Undergoing with PT. Remains on vancomycin . Has follow up with ID Dr. Anne Hahn on 11/12/12.  Remains on Xopenex Four times a day  And Budesonide Twice daily   Has been having back pain, seen by Dr. Leanord Hawking at [facility] on 11/05/12 , rx percocet for pain. Says it is not helping.  Denies hemoptysis , orthopnea, leg weakness, urinary symptoms or fever.  Leg swelling is much better , wt is down >40 lbs . Since admission .     Review of Systems  neg for any significant sore throat, dysphagia, itching, sneezing, nasal congestion or excess/ purulent secretions, fever, chills, sweats, , pleuritic or exertional cp, hempoptysis, orthopnea pnd or change in chronic leg swelling. Also denies presyncope, palpitations, heartburn, abdominal pain, nausea, vomiting, diarrhea or change in bowel or urinary habits, dysuria,hematuria, rash, arthralgias, visual complaints, headache, numbness weakness or ataxia.     Objective:   Physical Exam    Gen. Pleasant, obese, in no distress, normal affect ENT - no lesions, no post nasal drip, class 2-3 airway Neck: No JVD, no thyromegaly, no carotid bruits Lungs: no use of accessory muscles, no dullness to percussion, decreased without rales or rhonchi  Cardiovascular: Rhythm regular, heart sounds  normal, no murmurs or gallops, tr  peripheral edema, left sided chest wall pacemaker site -steri strips in place  Abdomen: soft and non-tender, no hepatosplenomegaly, BS normal. Musculoskeletal: No deformities, no cyanosis or clubbing Neuro:  alert, non focal, no tremors        Assessment & Plan:

## 2012-11-07 NOTE — Assessment & Plan Note (Signed)
S/p pacemaker - follow up with cards for post hosp for pacemaker

## 2012-11-07 NOTE — Assessment & Plan Note (Signed)
Cont on vanc  follow up with ID clinic as planned

## 2012-11-07 NOTE — Patient Instructions (Addendum)
follow up with Cardiology at pacemaker clinic next week  follow up with Dr. Anne Hahn at Infectious Disease clinic on 11/12/12  At 2 pm.  Continue on Budesonide Neb Twice daily  And [employer] Four times a day  .  follow up Dr. Vassie Loll  In 6 weeks and As needed   I will call with chest xray results.

## 2012-11-07 NOTE — Addendum Note (Signed)
Addended by: Boone Master E on: 11/07/2012 10:33 AM   Modules accepted: Orders

## 2012-11-10 ENCOUNTER — Ambulatory Visit: Payer: Medicare HMO | Admitting: Adult Health

## 2012-11-10 NOTE — Progress Notes (Signed)
Quick Note:  LM w/ Morrie Sheldon at [place] [phone] for pt to call back. ______

## 2012-11-12 ENCOUNTER — Encounter: Payer: Self-pay | Admitting: Internal Medicine

## 2012-11-12 ENCOUNTER — Ambulatory Visit (INDEPENDENT_AMBULATORY_CARE_PROVIDER_SITE_OTHER): Payer: Commercial Managed Care - HMO | Admitting: Internal Medicine

## 2012-11-12 VITALS — BP 109/73 | HR 62 | Temp 97.7°F | Wt 194.0 lb

## 2012-11-12 DIAGNOSIS — A0471 Enterocolitis due to Clostridium difficile, recurrent: Secondary | ICD-10-CM

## 2012-11-12 DIAGNOSIS — A0472 Enterocolitis due to Clostridium difficile, not specified as recurrent: Secondary | ICD-10-CM

## 2012-11-12 NOTE — Progress Notes (Signed)
[facility] NOTE  RFV: referral for FMT evaluation nad recurrent c.difficile management Subjective:    Patient ID: [name], female    DOB: Aug 31, 1955, 57 y.o.   MRN: [id number]  HPI  [name] 81NF F, with recurrent c.difficile infection. First episode of cdifficile occurred while hospitalized Dec 2013 for influenza and respiratory distress requiring ventilation. She acquired cdiff, as an HAI. Also had another episode in April. She states that 2 episodes ago, she was not able to get oral vancomycin, and worsened to be re-hospitalize. She was discharged from the hospital on vanco treatment and tapering dose.   Much improved from diarrhea, now having formed stool, having 15 x per day but stool caliber about size of her thumb.   #50 lb since the last 1-2 months, no fever or chills  Has been hospitalized for hte majority of this past year.   Current Outpatient Prescriptions on File Prior to Visit  Medication Sig Dispense Refill  . acetaminophen (TYLENOL) 325 MG tablet Take 650 mg by mouth every 6 (six) hours as needed for pain.      Marland Kitchen ALPRAZolam (XANAX XR) 0.5 MG 24 hr tablet Take 1 tablet (0.5 mg total) by mouth at bedtime.  30 tablet  5  . ALPRAZolam (XANAX) 0.25 MG tablet Take 1 tablet (0.25 mg total) by mouth every 4 (four) hours as needed for anxiety.  180 tablet  5  . budesonide (PULMICORT) 0.25 MG/2ML nebulizer solution Take 2 mLs (0.25 mg total) by nebulization 2 (two) times daily.  60 mL  1  . digoxin (LANOXIN) 0.25 MG tablet Take 1 tablet (0.25 mg total) by mouth daily.      Marland Kitchen diltiazem (CARDIZEM CD) 240 MG 24 hr capsule Take 1 capsule (240 mg total) by mouth 2 (two) times daily.      . famotidine (PEPCID) 20 MG tablet Take 1 tablet (20 mg total) by mouth at bedtime.  30 tablet  1  . feeding supplement (ENSURE COMPLETE) LIQD Take 237 mLs by mouth 2 (two) times daily between meals.      . feeding supplement (RESOURCE BREEZE) LIQD Take 1 Container by mouth daily.    0  . insulin  aspart (NOVOLOG) 100 UNIT/ML injection Inject 0-8 Units into the skin 3 (three) times daily with meals. On sliding scale:  0-100 0 units 101-150 take 3 units 151-200 take 5 units 201>300 take 8 units 400 or higher call MD      . insulin aspart (NOVOLOG) 100 UNIT/ML injection Inject 0-5 Units into the skin at bedtime.  1 vial  12  . insulin aspart (NOVOLOG) 100 UNIT/ML injection Inject 3 Units into the skin 3 (three) times daily with meals.  1 vial  12  . ipratropium (ATROVENT) 0.02 % nebulizer solution Take 2.5 mLs (0.5 mg total) by nebulization every 6 (six) hours.  75 mL  12  . levalbuterol (XOPENEX) 0.63 MG/3ML nebulizer solution Take 3 mLs (0.63 mg total) by nebulization every 3 (three) hours as needed for wheezing or shortness of breath.  3 mL  12  . levalbuterol (XOPENEX) 0.63 MG/3ML nebulizer solution Take 3 mLs (0.63 mg total) by nebulization every 6 (six) hours.  3 mL  12  . metFORMIN (GLUCOPHAGE) 500 MG tablet Take 1 tablet (500 mg total) by mouth 2 (two) times daily with a meal.      . oxyCODONE-acetaminophen (ROXICET) 5-325 MG per tablet Take one tablet by mouth every 6 hours as needed for pain  120  tablet  0  . rOPINIRole (REQUIP) 1 MG tablet Take 1 mg by mouth at bedtime.      . sertraline (ZOLOFT) 50 MG tablet Take 50 mg by mouth daily.      . traMADol (ULTRAM) 50 MG tablet Take 2 tablets (100 mg total) by mouth every 6 (six) hours as needed.  240 tablet  5  . traMADol (ULTRAM) 50 MG tablet Take 2 tablets (100 mg total) by mouth every 6 (six) hours as needed.  240 tablet  5  . vancomycin (VANCOCIN) 50 mg/mL oral solution Take 2.5 mLs (125 mg total) by mouth 4 (four) times daily.      Marland Kitchen warfarin (COUMADIN) 10 MG tablet Take 1 tablet (10 mg total) by mouth one time only at 6 PM.      . Warfarin - Pharmacist Dosing Inpatient MISC Will need INR follow up for dose adjust       No current facility-administered medications on file prior to visit.   Active Ambulatory Problems     Diagnosis Date Noted  . COPD (chronic obstructive pulmonary disease) 07/10/2010  . Obesity 07/10/2010  . GERD (gastroesophageal reflux disease) 02/29/2012  . Hyperlipidemia 02/29/2012  . Diabetes mellitus type 2 in obese 02/29/2012  . Chronic pain 02/29/2012  . Hypertension 03/12/2012  . Acute renal failure 09/09/2012  . Recurrent Clostridium difficile diarrhea 09/09/2012  . Atrial fibrillation 10/14/2012  . Atrial flutter 10/15/2012  . Cardiac arrest/complete heart block 10/15/2012  . Hypercapnic respiratory failure, chronic 10/19/2012  . Anasarca 10/20/2012   Resolved Ambulatory Problems    Diagnosis Date Noted  . Tobacco abuse 07/10/2010  . Acute-on-chronic respiratory failure 02/29/2012  . Hypotension (resolved 12/27) 02/29/2012  . Tachycardia 02/29/2012  . COPD exacerbation 02/29/2012  . HTN (hypertension) 02/29/2012  . Acute bronchitis 02/29/2012  . Acute respiratory failure 02/29/2012  . Flu syndrome 02/29/2012  . H1N1 influenza 03/01/2012  . Pneumococcal bronchitis 03/01/2012  . HAP (hospital-acquired pneumonia) 03/12/2012  . ARDS (adult respiratory distress syndrome) 03/12/2012  . Pulmonary edema 03/12/2012  . Pleural effusion, bilateral 03/12/2012  . Bradycardia 03/12/2012  . Ileus 03/12/2012  . Tracheitis 03/12/2012  . Type 2 diabetes mellitus with hyperglycemia 03/12/2012  . Tracheostomy status 03/31/2012  . Chest pain, unspecified 05/15/2012  . Intravascular volume depletion 09/09/2012  . Hypotension arterial 09/09/2012  . Severe sepsis with septic shock 09/10/2012  . Acute respiratory failure with hypoxia 09/10/2012  . Hypercapnia 09/10/2012  . Anemia 09/12/2012   Past Medical History  Diagnosis Date  . Chronic respiratory failure   . Morbid obesity   . Coronary vasospasm   . Arthritis   . Allergic rhinitis   . C. difficile diarrhea 09/2012  . Chronic kidney disease 09/2012  . Shock 09/2012   History  Substance Use Topics  . Smoking status: Former  Smoker -- 2.00 packs/day for 40 years    Types: Cigarettes    Quit date: 03/04/2012  . Smokeless tobacco: Never Used  . Alcohol Use: No  family history is not on file.   Review of Systems 12 point ROS reviewed, + frequent BM, weight loss, decrease appetite, poor energy    Objective:   Physical Exam BP 109/73  Pulse 62  Temp(Src) 97.7 F (36.5 C) (Oral)  Wt 194 lb (87.998 kg)  BMI 30.85 kg/m2 Physical Exam  Constitutional: wearing oxygen.oriented to person, place, and time. appears well-developed and well-nourished. No distress.  HENT:  Mouth/Throat: Oropharynx is clear and moist. No oropharyngeal exudate.  Cardiovascular: Normal rate, regular rhythm and normal heart sounds. Exam reveals no gallop and no friction rub.  No murmur heard.  Pulmonary/Chest: Effort normal and breath sounds normal. No respiratory distress.  no wheezes.  Abdominal: Soft. Bowel sounds are normal. exhibits no distension. There is no tenderness.  Lymphadenopathy: no cervical adenopathy.  Neurological:  alert and oriented to person, place, and time. Resting tremor Skin: Skin is warm and dry. No rash noted. No erythema.  Psychiatric:  a normal mood and affect.  behavior is normal.          Assessment & Plan:  Recurrent cdifficile = continue with prolonged vanco taper, start vanoc 125 TId, x 14 d, then 125mg  BID x 14 day, then 125mg  daily x 14 adays. Will ask to supplement with kefir/microbiotics 6-8 oz with meals TID.  Review options for FMT, gave info to consider  Protein malnutrition = will give supplementation with ensure 3 cans per day  Low Back pain =  Bring to attention to skilled facility to assess.  rtc in 4 wks

## 2012-11-18 ENCOUNTER — Encounter: Payer: Self-pay | Admitting: Internal Medicine

## 2012-11-20 NOTE — Progress Notes (Signed)
Quick Note:  [name], spoke with Ms. Nancy Marus. She is requesting I fax results to [phone].  She will also ask pt to call office back. Results were faxed. Ms. Nancy Marus aware. ______

## 2012-11-26 ENCOUNTER — Telehealth: Payer: Self-pay | Admitting: Internal Medicine

## 2012-11-26 DIAGNOSIS — J441 Chronic obstructive pulmonary disease with (acute) exacerbation: Secondary | ICD-10-CM

## 2012-11-26 DIAGNOSIS — E119 Type 2 diabetes mellitus without complications: Secondary | ICD-10-CM

## 2012-11-26 DIAGNOSIS — J9610 Chronic respiratory failure, unspecified whether with hypoxia or hypercapnia: Secondary | ICD-10-CM

## 2012-11-26 DIAGNOSIS — I4891 Unspecified atrial fibrillation: Secondary | ICD-10-CM

## 2012-11-26 NOTE — Telephone Encounter (Signed)
11-13-12 per amber pt needs fu with taylor to discuss atrial flutter/mt 11-18-12 sent letter, no response to phone message/mt

## 2012-12-05 ENCOUNTER — Other Ambulatory Visit: Payer: Self-pay | Admitting: Adult Health

## 2012-12-09 ENCOUNTER — Telehealth: Payer: Self-pay | Admitting: *Deleted

## 2012-12-09 NOTE — Telephone Encounter (Signed)
Faxed results to [place] and spoke with Claria there to confirm receipt of labs.

## 2012-12-09 NOTE — Telephone Encounter (Signed)
Received from [facility] results for patient. Patient is at [facility]. Faxed results to the facility.

## 2012-12-10 ENCOUNTER — Ambulatory Visit (INDEPENDENT_AMBULATORY_CARE_PROVIDER_SITE_OTHER): Payer: Medicare HMO | Admitting: Internal Medicine

## 2012-12-10 ENCOUNTER — Encounter: Payer: Self-pay | Admitting: Internal Medicine

## 2012-12-10 ENCOUNTER — Ambulatory Visit: Payer: Medicare HMO | Admitting: Internal Medicine

## 2012-12-10 VITALS — BP 120/69 | HR 65 | Ht 65.0 in | Wt 186.4 lb

## 2012-12-10 DIAGNOSIS — I4891 Unspecified atrial fibrillation: Secondary | ICD-10-CM

## 2012-12-10 DIAGNOSIS — Z950 Presence of cardiac pacemaker: Secondary | ICD-10-CM | POA: Insufficient documentation

## 2012-12-10 LAB — PACEMAKER DEVICE OBSERVATION
AL AMPLITUDE: 0.7 mv
AL THRESHOLD: 0.75 V
BAMS-0001: 150 {beats}/min
BATTERY VOLTAGE: 2.79 V
RV LEAD THRESHOLD: 0.5 V
VENTRICULAR PACING PM: 63

## 2012-12-10 NOTE — Progress Notes (Signed)
HPI [name] returns today for followup. She is a very pleasant 57 year old woman with a history of symptomatic bradycardia heart block, status post permanent pacemaker insertion, who had a very extensive and prolonged hospital course over the last year resulting in tracheostomy placement with eventual removal, COPD, atrial fibrillation, hypertension, non-ST elevation MI, and previously uncontrolled diabetes. Despite her multiple problems, she has gradually improved. She remains on home oxygen therapy, but has been increasing her physical activity over the last month. She denies chest pain, or syncope. She has class II dyspnea. She has had no syncope Allergies  Allergen Reactions  . Ambien [Zolpidem Tartrate] Other (See Comments)    Severe hallucinations and behavior changes reported by family  . Ciprofloxacin     PATIENT AT VERY HIGH RISK FOR C DIFFICILE COLITIS  . Actos [Pioglitazone] Swelling  . Lunesta [Eszopiclone] Other (See Comments)    Nightmares & hallucinations  . Protonix [Pantoprazole Sodium] Other (See Comments)    Unknown reaction-"Doctor told me to never take it"     Current Outpatient Prescriptions  Medication Sig Dispense Refill  . acetaminophen (TYLENOL) 325 MG tablet Take 650 mg by mouth every 6 (six) hours as needed for pain.      Marland Kitchen ALPRAZolam (XANAX XR) 0.5 MG 24 hr tablet Take 1 tablet (0.5 mg total) by mouth at bedtime.  30 tablet  5  . ALPRAZolam (XANAX) 0.25 MG tablet Take 1 tablet (0.25 mg total) by mouth every 4 (four) hours as needed for anxiety.  180 tablet  5  . budesonide (PULMICORT) 0.25 MG/2ML nebulizer solution Take 2 mLs (0.25 mg total) by nebulization 2 (two) times daily.  60 mL  1  . digoxin (LANOXIN) 0.25 MG tablet Take 1 tablet (0.25 mg total) by mouth daily.      Marland Kitchen diltiazem (CARDIZEM CD) 240 MG 24 hr capsule Take 1 capsule (240 mg total) by mouth 2 (two) times daily.      . famotidine (PEPCID) 20 MG tablet Take 1 tablet (20 mg total) by  mouth at bedtime.  30 tablet  1  . feeding supplement (ENSURE COMPLETE) LIQD Take 237 mLs by mouth 2 (two) times daily between meals.      . feeding supplement (RESOURCE BREEZE) LIQD Take 1 Container by mouth daily.    0  . furosemide (LASIX) 40 MG tablet       . HYDROcodone-acetaminophen (NORCO) 10-325 MG per tablet       . insulin aspart (NOVOLOG) 100 UNIT/ML injection Inject 0-8 Units into the skin 3 (three) times daily with meals. On sliding scale:  0-100 0 units 101-150 take 3 units 151-200 take 5 units 201>300 take 8 units 400 or higher call MD      . insulin aspart (NOVOLOG) 100 UNIT/ML injection Inject 3 Units into the skin 3 (three) times daily with meals.  1 vial  12  . ipratropium (ATROVENT) 0.02 % nebulizer solution Take 2.5 mLs (0.5 mg total) by nebulization every 6 (six) hours.  75 mL  12  . levalbuterol (XOPENEX) 0.63 MG/3ML nebulizer solution Take 3 mLs (0.63 mg total) by nebulization every 3 (three) hours as needed for wheezing or shortness of breath.  3 mL  12  . levalbuterol (XOPENEX) 0.63 MG/3ML nebulizer solution Take 3 mLs (0.63 mg total) by nebulization every 6 (six) hours.  3 mL  12  . metFORMIN (GLUCOPHAGE) 500 MG tablet Take 1 tablet (500 mg total) by mouth 2 (two) times  daily with a meal.      . oxyCODONE-acetaminophen (ROXICET) 5-325 MG per tablet Take one tablet by mouth every 6 hours as needed for pain  120 tablet  0  . rOPINIRole (REQUIP) 1 MG tablet Take 1 mg by mouth at bedtime.      . sertraline (ZOLOFT) 50 MG tablet Take 50 mg by mouth daily.      . traMADol (ULTRAM) 50 MG tablet Take 2 tablets (100 mg total) by mouth every 6 (six) hours as needed.  240 tablet  5  . traMADol (ULTRAM) 50 MG tablet Take 2 tablets (100 mg total) by mouth every 6 (six) hours as needed.  240 tablet  5  . vancomycin (VANCOCIN) 50 mg/mL oral solution Take 2.5 mLs (125 mg total) by mouth 4 (four) times daily.       No current facility-administered medications for this visit.      Past Medical History  Diagnosis Date  . COPD (chronic obstructive pulmonary disease)   . Chronic respiratory failure   . Morbid obesity   . Tobacco abuse   . GERD (gastroesophageal reflux disease)   . Hyperlipidemia   . Chronic pain   . Hypertension   . Coronary vasospasm     a. 03/2004 NSTEMI/Cath: EF 45%, LM nl, LAD nl, D1 nl, D2 nl, LCX nl, OM1 large-nl, RCA-spasm noted, otw nl.  . Arthritis   . Allergic rhinitis   . Diabetes mellitus type 2 in obese 02/29/2012  . HTN (hypertension) 02/29/2012  . 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 recurrent in setting of abx noncompliance.  . Chronic kidney disease 09/2012    acute renal failure  . Shock 09/2012  . H1N1 influenza     ROS:   All systems reviewed and negative except as noted in the HPI.   Past Surgical History  Procedure Laterality Date  . Cholecystectomy    . Partial hysterectomy    . Angioplasty    . Umbilical hernia repair  12/2009     History reviewed. No pertinent family history.   History   Social History  . Marital Status: Married    Spouse Name: N/A    Number of Children: 1  . Years of Education: N/A   Occupational History  . Environmental education officer   Social History Main Topics  . Smoking status: Former Smoker -- 2.00 packs/day for 40 years    Types: Cigarettes    Quit date: 03/04/2012  . Smokeless tobacco: Never Used  . Alcohol Use: No  . Drug Use: No  . Sexual Activity: Not on file   Other Topics Concern  . Not on file   Social History Narrative   Lives in [place] with her husband.  She does not routinely exercise.     BP 120/69  Pulse 65  Ht 5\' 5"  (1.651 m)  Wt 186 lb 6.4 oz (84.55 kg)  BMI 31.02 kg/m2  Physical Exam:  stable appearing middle-age woman,NAD HEENT: Unremarkable Neck:  No JVD, no thyromegally, old tracheostomy scar, well-healed Lymphatics:  No adenopathy Back:  No CVA tenderness Lungs:  Clear, except for scattered rales in the bases, with no wheezes,  or rhonchi. Well-healed pacemaker incision HEART:  Regular rate rhythm, no murmurs, no rubs, no clicks Abd:  soft, positive bowel sounds, no organomegally, no rebound, no guarding Ext:  2 plus pulses, no edema, no cyanosis, no clubbing Skin:  No rashes no nodules Neuro:  CN II  through XII intact, motor grossly intact   DEVICE  Normal device function.  See PaceArt for details.   Assess/Plan: 

## 2012-12-10 NOTE — Assessment & Plan Note (Signed)
The patient has been in and out of rhythm approximately 50% of the time. She is not particularly symptomatic. She will continue a strategy of rate control. Despite her thromboembolic risk, she is not a good candidate for long-term Coumadin therapy with her history of gastrointestinal bleeding.

## 2012-12-10 NOTE — Assessment & Plan Note (Signed)
Her dual chamber Medtronic pacemaker is working normally. Plan to recheck in several months.

## 2012-12-10 NOTE — Patient Instructions (Signed)
Your physician recommends that you continue on your current medications as directed. Please refer to the Current Medication list given to you today.  Your physician wants you to follow-up in: 9 [facility] You will receive a reminder letter in the mail two months in advance. If you don't receive a letter, please call our office to schedule the follow-up appointment.

## 2012-12-15 ENCOUNTER — Ambulatory Visit: Payer: Medicare HMO | Admitting: Internal Medicine

## 2012-12-19 ENCOUNTER — Ambulatory Visit: Payer: Medicare HMO | Admitting: Pulmonary Disease

## 2012-12-23 ENCOUNTER — Other Ambulatory Visit: Payer: Self-pay | Admitting: Adult Health

## 2012-12-26 DIAGNOSIS — G2581 Restless legs syndrome: Secondary | ICD-10-CM | POA: Insufficient documentation

## 2012-12-26 DIAGNOSIS — F411 Generalized anxiety disorder: Secondary | ICD-10-CM | POA: Insufficient documentation

## 2012-12-26 DIAGNOSIS — M549 Dorsalgia, unspecified: Secondary | ICD-10-CM | POA: Insufficient documentation

## 2012-12-26 NOTE — Progress Notes (Signed)
Patient ID: [name], female   DOB: Jul 13, 1955, 57 y.o.   MRN: [id number]  [place]  Allergies  Allergen Reactions  . Ambien [Zolpidem Tartrate] Other (See Comments)    Severe hallucinations and behavior changes reported by family  . Ciprofloxacin     PATIENT AT VERY HIGH RISK FOR C DIFFICILE COLITIS  . Actos [Pioglitazone] Swelling  . Lunesta [Eszopiclone] Other (See Comments)    Nightmares & hallucinations  . Protonix [Pantoprazole Sodium] Other (See Comments)    Unknown reaction-"Doctor told me to never take it"    Chief Complaint  Patient presents with  . Hospitalization Follow-up    HPI  She has had a complex set of hospitalizations. She was originally hospitalized with colitis weakness; she had an exacerbation of her copd; and developed afib/flutter. She required a pace maker insertion. She did not tolerate narcotics and had a change of concessions. She is here for short term rehab and will return back home. She is anxious to return back home.   Past Medical History  Diagnosis Date  . COPD (chronic obstructive pulmonary disease)   . Chronic respiratory failure   . Morbid obesity   . Tobacco abuse   . GERD (gastroesophageal reflux disease)   . Hyperlipidemia   . Chronic pain   . Hypertension   . Coronary vasospasm     a. 03/2004 NSTEMI/Cath: EF 45%, LM nl, LAD nl, D1 nl, D2 nl, LCX nl, OM1 large-nl, RCA-spasm noted, otw nl.  . Arthritis   . Allergic rhinitis   . Diabetes mellitus type 2 in obese 02/29/2012  . HTN (hypertension) 02/29/2012  . 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 recurrent in setting of abx noncompliance.  . Chronic kidney disease 09/2012    acute renal failure  . Shock 09/2012  . H1N1 influenza     Past Surgical History  Procedure Laterality Date  . Cholecystectomy    . Partial hysterectomy    . Angioplasty    . Umbilical hernia repair  12/2009    Filed Vitals:   10/31/12 1426  BP: 127/68  Pulse: 80  Height: 5\' 5"  (1.651 m)   Weight: 216 lb (97.977 kg)    MEDICATIONS requip 1 mg nightly Zoloft 50 mg daily Ultram 100 mg every 6 hours as needed Vancomycin 50mg /mc 2.5 cc four times daily Coumadin 10 mg daily novolog 3 units prior  To meals with an additional 5 units for cbg >=150 pulmicort neb twice daily Digoxin 0.25 mg daily cardizem cd 240 mg twice daily pepcid 20 mg nightly atrovent neb every 6 hours xopenex neb every 6 hours and every 3 hours as needed Metformin 500 mg twice daily Xanax xr 0.5 mg nightly Xanax 0.25 mg every 4 hours as needed    Review of Systems  Constitutional: Negative for malaise/fatigue.  Respiratory: Negative for cough, shortness of breath and wheezing.   Cardiovascular: Negative for chest pain and palpitations.  Gastrointestinal: Negative for heartburn, abdominal pain and diarrhea.  Musculoskeletal: Positive for back pain and myalgias.  Skin: Negative.   Neurological: Negative for headaches.  Psychiatric/Behavioral: Negative for depression. The patient is not nervous/anxious.    Physical Exam  Constitutional: She is oriented to person, place, and time. She appears well-developed and well-nourished.  obese  Neck: Neck supple. No JVD present.  Cardiovascular: Normal rate, regular rhythm and intact distal pulses.   Respiratory: Effort normal and breath sounds normal. No respiratory distress. She has no wheezes.  GI: Soft. Bowel sounds  are normal. She exhibits no distension.  Has mild generalized tenderness   Musculoskeletal: Normal range of motion. She exhibits no edema.  Neurological: She is alert and oriented to person, place, and time.  Skin: Skin is warm and dry.      Assessment/plan  1. Copd: she is presently stable will continue  her pulmicort neb twice daily; atrovent neb every 6hours; xopenex neb every 6 hours and every 3 hours as needed and will monitor her status   2. Afib/flutter: she is stable will continue digoxin 0.25 mg daily and coumadin 10 mg daily  for inr 2.01 and will check inr in one week.  3. Hypertension is stable will continue cardizem cd 240 mg twice daily and will monitor  4. Diabetes: is stable will continue metformin 500 mg twice daily and novolog 3 units prior to meals with an additional 5 units as needed for cbg >=150   5. Recurrent c-diff infection: will continue po vanc and will continue to monitor her response  6. Anxiety: she is stable with zoloft 50 mg daily; xanax xr 0.5 mg nightly and xanax 0.25 mg every 4 hours as needed  7. RLS; will continue requip 1 mg nightly   8. Back pain will start aleve twice daily and will monitor   Will check bmp; dig next draw

## 2013-01-04 ENCOUNTER — Other Ambulatory Visit: Payer: Self-pay | Admitting: Adult Health

## 2013-01-09 ENCOUNTER — Other Ambulatory Visit: Payer: Self-pay | Admitting: Adult Health

## 2013-01-14 ENCOUNTER — Other Ambulatory Visit: Payer: Self-pay | Admitting: Adult Health

## 2013-02-03 ENCOUNTER — Ambulatory Visit (INDEPENDENT_AMBULATORY_CARE_PROVIDER_SITE_OTHER): Payer: Medicare HMO | Admitting: Pulmonary Disease

## 2013-02-03 ENCOUNTER — Encounter: Payer: Self-pay | Admitting: Pulmonary Disease

## 2013-02-03 VITALS — BP 140/84 | HR 79 | Temp 97.6°F | Ht 66.5 in | Wt 181.0 lb

## 2013-02-03 DIAGNOSIS — J449 Chronic obstructive pulmonary disease, unspecified: Secondary | ICD-10-CM

## 2013-02-03 DIAGNOSIS — Z950 Presence of cardiac pacemaker: Secondary | ICD-10-CM

## 2013-02-03 DIAGNOSIS — Z23 Encounter for immunization: Secondary | ICD-10-CM

## 2013-02-03 NOTE — Assessment & Plan Note (Signed)
Stay on budesonide & xopenex/ atrovent nebs Flu shot

## 2013-02-03 NOTE — Progress Notes (Signed)
   Subjective:    Patient ID: [name], female    DOB: [birth year], 57 y.o.   MRN: [id number]  HPI  57/F , obese , ex-heavy  smoker for FU of COPD   On O2 2L/min since admission 11/2009 for incarcerated umbilical hernia complicated by post op resp failure requiring mechanical ventilation. She reports sporadic compliance with this.   EKG - RBBB  Admitted 02/29/12 w/ H1N1 w/ ARDS complicated by secondary pneumococcal PNA . She had a prolonged course w/ tracheostomy . 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  Was sucessfully liberated from vent and decannulated 04/14/12.  c/o diarrhea since hospital d/c - C diff POS -PCP treating with flagyl & ? Po vanc     Admitted 8/7-28/14 for recurrent C.diff ,AKI. 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 Difficile PCR was negative) and treated with a course of 10 d Fidaxomicin, followed by oral vanc with planned vanc taper.  She developed atrial fib/flutter on 8/12 and On 8/13 she had 50 sec episode of ventricular stand-still with loss of consciousness.  She was felt to have tachy/brady syndrome and episode of complete heart block and required permanent pacemaker placement  She further had an episode of hypercarbic failure due to narcotics and another due to pulmonary edema     02/03/2013  Chief Complaint  Patient presents with  . Follow-up    Pt reports her breathing is better since last OV. Pt has some cough and very rare does she bring up phlem, chest tx.   she is able to go without oxygen for limited periods of time C/o left shoulder pain Remains on Xopenex Four times a day And Budesonide Twice daily  Diarrhea - took 10 ds of PO vanc, even though C. Difficile PCR negative No fu appt with EP scheduled O2 satn 88 % on RA, required 4L while walking  Review of Systems neg for any significant sore throat, dysphagia, itching, sneezing, nasal congestion or excess/ purulent  secretions, fever, chills, sweats, unintended wt loss, pleuritic or exertional cp, hempoptysis, orthopnea pnd or change in chronic leg swelling. Also denies presyncope, palpitations, heartburn, abdominal pain, nausea, vomiting, diarrhea or change in bowel or urinary habits, dysuria,hematuria, rash, arthralgias, visual complaints, headache, numbness weakness or ataxia.     Objective:   Physical Exam  Gen. Pleasant, well-nourished, in no distress ENT - no lesions, no post nasal drip Neck: No JVD, no thyromegaly, no carotid bruits Lungs: no use of accessory muscles, no dullness to percussion, clear without rales or rhonchi  Cardiovascular: Rhythm regular, heart sounds  normal, no murmurs or gallops, no peripheral edema Musculoskeletal: No deformities, no cyanosis or clubbing        Assessment & Plan:

## 2013-02-03 NOTE — Assessment & Plan Note (Signed)
We will arrange Fu appt with Dr Ladona Ridgel Doubt left upper extremity pain is related to pacemaker or cardiac origin

## 2013-02-03 NOTE — Patient Instructions (Addendum)
Check satn on RA We will arrange Fu appt with Dr Ladona Ridgel Stay on same nebs Flu shot

## 2013-02-19 ENCOUNTER — Encounter: Payer: Self-pay | Admitting: Internal Medicine

## 2013-02-19 ENCOUNTER — Ambulatory Visit (INDEPENDENT_AMBULATORY_CARE_PROVIDER_SITE_OTHER): Payer: Commercial Managed Care - HMO | Admitting: Internal Medicine

## 2013-02-19 VITALS — BP 130/64 | HR 90 | Ht 65.0 in | Wt 178.5 lb

## 2013-02-19 DIAGNOSIS — I4891 Unspecified atrial fibrillation: Secondary | ICD-10-CM

## 2013-02-19 DIAGNOSIS — I469 Cardiac arrest, cause unspecified: Secondary | ICD-10-CM

## 2013-02-19 DIAGNOSIS — Z950 Presence of cardiac pacemaker: Secondary | ICD-10-CM

## 2013-02-19 LAB — MDC_IDC_ENUM_SESS_TYPE_INCLINIC
Battery Impedance: 100 Ohm
Battery Remaining Longevity: 160 mo
Battery Voltage: 2.79 V
Brady Statistic AS VS Percent: 90 %
Date Time Interrogation Session: 20141218153546
Lead Channel Impedance Value: 484 Ohm
Lead Channel Impedance Value: 495 Ohm
Lead Channel Pacing Threshold Pulse Width: 0.4 ms
Lead Channel Setting Pacing Amplitude: 2 V
Lead Channel Setting Pacing Amplitude: 2.5 V
Lead Channel Setting Pacing Pulse Width: 0.4 ms
Lead Channel Setting Sensing Sensitivity: 4 mV

## 2013-02-19 NOTE — Patient Instructions (Signed)
Your physician wants you to follow-up in: 10/2013 with Dr Karoline Caldwell will receive a reminder letter in the mail two months in advance. If you don't receive a letter, please call our office to schedule the follow-up appointment.    Remote monitoring is used to monitor your Pacemaker or ICD from home. This monitoring reduces the number of office visits required to check your device to one time per year. It allows [place] to keep an eye on the functioning of your device to ensure it is working properly. You are scheduled for a device check from home on 05/25/13. You may send your transmission at any time that day. If you have a wireless device, the transmission will be sent automatically. After your physician reviews your transmission, you will receive a postcard with your next transmission date.

## 2013-03-04 NOTE — Progress Notes (Signed)
HPI [name] returns today for followup. She is a very pleasant 57 year old woman with a history of symptomatic bradycardia heart block, status post permanent pacemaker insertion, who had a very extensive and prolonged hospital course over the last year resulting in tracheostomy placement with eventual removal, COPD, atrial fibrillation, hypertension, non-ST elevation MI, and previously uncontrolled diabetes. Despite her multiple problems, she has gradually improved. She has discontinued her home oxygen therapy, and has been increasing her physical activity over the last month. She denies chest pain, or syncope. She has class II dyspnea. She has had no syncope Allergies  Allergen Reactions  . Ambien [Zolpidem Tartrate] Other (See Comments)    Severe hallucinations and behavior changes reported by family  . Ciprofloxacin     PATIENT AT VERY HIGH RISK FOR C DIFFICILE COLITIS  . Actos [Pioglitazone] Swelling  . Lunesta [Eszopiclone] Other (See Comments)    Nightmares & hallucinations  . Protonix [Pantoprazole Sodium] Other (See Comments)    Unknown reaction-"Doctor told me to never take it"     Current Outpatient Prescriptions  Medication Sig Dispense Refill  . acetaminophen (TYLENOL) 325 MG tablet Take 650 mg by mouth every 6 (six) hours as needed for pain.      Marland Kitchen albuterol (PROAIR HFA) 108 (90 BASE) MCG/ACT inhaler Inhale 1-2 puffs into the lungs every 4 (four) hours as needed for wheezing or shortness of breath.      . ALPRAZolam (XANAX XR) 0.5 MG 24 hr tablet Take 1 tablet (0.5 mg total) by mouth at bedtime.  30 tablet  5  . budesonide (PULMICORT) 0.25 MG/2ML nebulizer solution Take 2 mLs (0.25 mg total) by nebulization 2 (two) times daily.  60 mL  1  . digoxin (LANOXIN) 0.25 MG tablet Take 1 tablet (0.25 mg total) by mouth daily.      Marland Kitchen diltiazem (CARDIZEM CD) 240 MG 24 hr capsule Take 1 capsule (240 mg total) by mouth 2 (two) times daily.      . famotidine (PEPCID) 20 MG  tablet Take 1 tablet (20 mg total) by mouth at bedtime.  30 tablet  1  . furosemide (LASIX) 40 MG tablet Take 40 mg by mouth 2 (two) times daily.       Marland Kitchen HYDROcodone-acetaminophen (NORCO) 10-325 MG per tablet       . insulin aspart (NOVOLOG) 100 UNIT/ML injection Inject 0-8 Units into the skin 3 (three) times daily with meals. On sliding scale:  0-100 0 units 101-150 take 3 units 151-200 take 5 units 201>300 take 8 units 400 or higher call MD      . insulin aspart (NOVOLOG) 100 UNIT/ML injection Inject 3 Units into the skin 3 (three) times daily with meals.  1 vial  12  . ipratropium (ATROVENT) 0.02 % nebulizer solution Take 2.5 mLs (0.5 mg total) by nebulization every 6 (six) hours.  75 mL  12  . levalbuterol (XOPENEX) 0.63 MG/3ML nebulizer solution Take 3 mLs (0.63 mg total) by nebulization every 6 (six) hours.  3 mL  12  . metFORMIN (GLUCOPHAGE) 500 MG tablet Take 1 tablet (500 mg total) by mouth 2 (two) times daily with a meal.      . rOPINIRole (REQUIP) 1 MG tablet Take 1 mg by mouth at bedtime.      . sertraline (ZOLOFT) 50 MG tablet Take 50 mg by mouth daily.       No current facility-administered medications for this visit.     Past Medical  History  Diagnosis Date  . COPD (chronic obstructive pulmonary disease)   . Chronic respiratory failure   . Morbid obesity   . Tobacco abuse   . GERD (gastroesophageal reflux disease)   . Hyperlipidemia   . Chronic pain   . Hypertension   . Coronary vasospasm     a. 03/2004 NSTEMI/Cath: EF 45%, LM nl, LAD nl, D1 nl, D2 nl, LCX nl, OM1 large-nl, RCA-spasm noted, otw nl.  . Arthritis   . Allergic rhinitis   . Diabetes mellitus type 2 in obese 02/29/2012  . HTN (hypertension) 02/29/2012  . 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 recurrent in setting of abx noncompliance.  . Chronic kidney disease 09/2012    acute renal failure  . Shock 09/2012  . H1N1 influenza     ROS:   All systems reviewed and negative except as noted in the  HPI.   Past Surgical History  Procedure Laterality Date  . Cholecystectomy    . Partial hysterectomy    . Angioplasty    . Umbilical hernia repair  12/2009     No family history on file.   History   Social History  . Marital Status: Married    Spouse Name: N/A    Number of Children: 1  . Years of Education: N/A   Occupational History  . Environmental education officer   Social History Main Topics  . Smoking status: Former Smoker -- 2.00 packs/day for 40 years    Types: Cigarettes    Quit date: 03/04/2012  . Smokeless tobacco: Never Used  . Alcohol Use: No  . Drug Use: No  . Sexual Activity: Not on file   Other Topics Concern  . Not on file   Social History Narrative   Lives in [place] with her husband.  She does not routinely exercise.     BP 130/64  Pulse 90  Ht 5\' 5"  (1.651 m)  Wt 178 lb 8 oz (80.967 kg)  BMI 29.70 kg/m2  Physical Exam:  stable appearing middle-age woman,NAD HEENT: Unremarkable Neck:  No JVD, no thyromegally, old tracheostomy scar, well-healed Back:  No CVA tenderness Lungs:  Clear, except for scattered rales in the bases, with no wheezes, or rhonchi. Well-healed pacemaker incision HEART:  Regular rate rhythm, no murmurs, no rubs, no clicks Abd:  soft, positive bowel sounds, no organomegally, no rebound, no guarding Ext:  2 plus pulses, no edema, no cyanosis, no clubbing Skin:  No rashes no nodules Neuro:  CN II through XII intact, motor grossly intact   DEVICE  Normal device function.  See PaceArt for details.   Assess/Plan:

## 2013-03-04 NOTE — Assessment & Plan Note (Signed)
Pacemaker interogation demonstrates no evidence of atrial fibrillation. Despite her stroke risk, she is currently not thought to be a candidate for systemic anticoagulation

## 2013-03-04 NOTE — Assessment & Plan Note (Signed)
Her Medtronic dual-chamber pacemaker is working normally. We'll plan to recheck in several months. 

## 2013-05-06 ENCOUNTER — Ambulatory Visit: Payer: Medicare HMO | Admitting: Adult Health

## 2013-05-08 ENCOUNTER — Ambulatory Visit: Payer: Medicare HMO | Admitting: Adult Health

## 2013-05-14 ENCOUNTER — Other Ambulatory Visit (INDEPENDENT_AMBULATORY_CARE_PROVIDER_SITE_OTHER): Payer: Commercial Managed Care - HMO

## 2013-05-14 ENCOUNTER — Ambulatory Visit (INDEPENDENT_AMBULATORY_CARE_PROVIDER_SITE_OTHER): Payer: Medicare HMO | Admitting: Adult Health

## 2013-05-14 ENCOUNTER — Ambulatory Visit (INDEPENDENT_AMBULATORY_CARE_PROVIDER_SITE_OTHER)
Admission: RE | Admit: 2013-05-14 | Discharge: 2013-05-14 | Disposition: A | Discharge status: Home / Self Care | Payer: Medicare HMO | Source: Ambulatory Visit | Attending: Adult Health | Admitting: Adult Health

## 2013-05-14 ENCOUNTER — Encounter: Payer: Self-pay | Admitting: Adult Health

## 2013-05-14 VITALS — BP 128/84 | HR 84 | Temp 97.5°F | Ht 66.0 in | Wt 188.2 lb

## 2013-05-14 DIAGNOSIS — I10 Essential (primary) hypertension: Secondary | ICD-10-CM

## 2013-05-14 DIAGNOSIS — J449 Chronic obstructive pulmonary disease, unspecified: Secondary | ICD-10-CM

## 2013-05-14 LAB — BASIC METABOLIC PANEL
BUN: 21 mg/dL (ref 6–23)
CALCIUM: 9.1 mg/dL (ref 8.4–10.5)
CO2: 36 meq/L — AB (ref 19–32)
CREATININE: 0.8 mg/dL (ref 0.4–1.2)
Chloride: 93 mEq/L — ABNORMAL LOW (ref 96–112)
GFR: 75.89 mL/min (ref >60.00)
Glucose, Bld: 166 mg/dL — ABNORMAL HIGH (ref 70–99)
Potassium: 3.6 mEq/L (ref 3.5–5.1)
SODIUM: 139 meq/L (ref 135–145)

## 2013-05-14 MED ORDER — BUDESONIDE-FORMOTEROL FUMARATE 160-4.5 MCG/ACT IN AERO: 2.0000 | INHALATION_SPRAY | Freq: Two times a day (BID) | RESPIRATORY_TRACT | Status: DC

## 2013-05-14 NOTE — Assessment & Plan Note (Signed)
Controlled on current regimen BMET today per primary care physician request

## 2013-05-14 NOTE — Patient Instructions (Addendum)
Begin Symbicort 160/4.71mcg 2 puffs Twice daily  , rinse after use.  Check cxr today. Mucinex DM Twice daily  As needed  Cough/congestion  Continue on on Oxygen 4l/m with walking and At bedtime   Follow up Dr. Vassie Loll  In 3 months and As needed

## 2013-05-14 NOTE — Assessment & Plan Note (Signed)
Pt with severe COPD w/ asthmatic component on PFT  Not taking nebs as directed due to cost issues with insurance  Need to restart maintenance inhaler  Needs follow up cxr  Today   Plan  Begin Symbicort 160/4.63mcg 2 puffs Twice daily  , rinse after use.  Check cxr today. Mucinex DM Twice daily  As needed  Cough/congestion  Continue on on Oxygen 4l/m with walking and At bedtime   Follow up Dr. Vassie Loll  In 3 months and As needed

## 2013-05-14 NOTE — Progress Notes (Signed)
Subjective:    Patient ID: [name], female    DOB: October 17, 1955, 58 y.o.   MRN: [id number]  HPI 57/F , obese , ex-heavy  smoker for FU of COPD   On O2 2L/min since admission 11/2009 for incarcerated umbilical hernia complicated by post op resp failure requiring mechanical ventilation. She reports sporadic compliance with this.   EKG - RBBB  Admitted 02/29/12 w/ H1N1 w/ ARDS complicated by secondary pneumococcal PNA . She had a prolonged course w/ tracheostomy . 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  Was sucessfully liberated from vent and decannulated 04/14/12.  c/o diarrhea since hospital d/c - C diff POS -PCP treating with flagyl & ? Po vanc     Admitted 8/7-28/14 for recurrent C.diff ,AKI. 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 Difficile PCR was negative) and treated with a course of 10 d Fidaxomicin, followed by oral vanc with planned vanc taper.  She developed atrial fib/flutter on 8/12 and On 8/13 she had 50 sec episode of ventricular stand-still with loss of consciousness.  She was felt to have tachy/brady syndrome and episode of complete heart block and required permanent pacemaker placement  She further had an episode of hypercarbic failure due to narcotics and another due to pulmonary edema     02/03/13  Chief Complaint  Patient presents with  . Follow-up    Breathing has improved. On O2 when ambulating and RA when still. Pts PCP, Tomi BambergerSusan Fuller, is requesting to have K+ level . C/o of new muscle spasms in feet x 2 weeks. Pt states a couple days ago pts pacemaker scar has been very sore, "feels like it was on fire". Denies SOB and CP.   she is able to go without oxygen for limited periods of time C/o left shoulder pain Remains on Xopenex Four times a day And Budesonide Twice daily  Diarrhea - took 10 ds of PO vanc, even though C. Difficile PCR negative No fu appt with EP scheduled O2 satn 88 % on  RA, required 4L while walking >>ov set up with Cards   05/14/2013 Follow up COPD  Returns for 3 month follow up .  Breathing has improved. On O2 when ambulating and RA when still. . Denies SOB and CP.  On O2 3-4 L with walking and At bedtime  .  PCP is out of office d/t illness. Requested our office check bmet .  Not taking Nebs for several months, out of due to cost .  Has taken symbicort and Advair in past.  She denies any chest pain, hemoptysis, orthopnea, PND, leg swelling.  Review of Systems  neg for any significant sore throat, dysphagia, itching, sneezing, nasal congestion or excess/ purulent secretions, fever, chills, sweats, unintended wt loss, pleuritic or exertional cp, hempoptysis, orthopnea pnd or change in chronic leg swelling. Also denies presyncope, palpitations, heartburn, abdominal pain, nausea, vomiting, diarrhea or change in bowel or urinary habits, dysuria,hematuria, rash, arthralgias, visual complaints, headache, numbness weakness or ataxia.     Objective:   Physical Exam   Gen. Pleasant, well-nourished, in no distress ENT - no lesions, no post nasal drip Neck: No JVD, no thyromegaly, no carotid bruits Lungs: no use of accessory muscles, no dullness to percussion, clear without rales or rhonchi  Cardiovascular: Rhythm regular, heart sounds  normal, no murmurs or gallops, no peripheral edema Musculoskeletal: No deformities, no cyanosis or clubbing   CXR  11/07/12 >Confluent abnormal opacity at the right lung base, favor combination of small pleural effusion, consolidation, and some opacity from the right Bochdalek's hernia seen on the prior CT.      Assessment & Plan:

## 2013-05-14 NOTE — Addendum Note (Signed)
Addended by: Nicanor Alcon on: 05/14/2013 09:56 AM   Modules accepted: Orders

## 2013-05-14 NOTE — Addendum Note (Signed)
Addended by: Boone Master E on: 05/14/2013 11:36 AM   Modules accepted: Orders, Medications

## 2013-05-18 NOTE — Progress Notes (Signed)
Quick Note:  Called spoke with patient, advised of cxr results / recs as stated by TP. Pt verbalized her understanding and denied any questions. Pt due for follow up with RA 08/2013 -- need for cxr added to recall note. ______

## 2013-05-18 NOTE — Progress Notes (Signed)
Quick Note:  Called spoke with patient, advised of lab results / recs as stated by TP. Pt verbalized her understanding and denied any questions. Labs faxed to her PCP via [facility]. ______

## 2013-05-20 ENCOUNTER — Telehealth: Payer: Self-pay | Admitting: Internal Medicine

## 2013-05-20 NOTE — Telephone Encounter (Signed)
New message         Pt would like to know how to send a transmission without a house phone. She says she does not have a transmitter either. Please give her a call.

## 2013-05-20 NOTE — Telephone Encounter (Signed)
Patient scheduled to come in office 3/26 and will be enrolled for [facility] smart phone.

## 2013-07-06 ENCOUNTER — Ambulatory Visit (INDEPENDENT_AMBULATORY_CARE_PROVIDER_SITE_OTHER): Payer: Commercial Managed Care - HMO | Admitting: *Deleted

## 2013-07-06 DIAGNOSIS — I4892 Unspecified atrial flutter: Secondary | ICD-10-CM

## 2013-07-06 DIAGNOSIS — I4891 Unspecified atrial fibrillation: Secondary | ICD-10-CM

## 2013-07-06 LAB — MDC_IDC_ENUM_SESS_TYPE_INCLINIC
Battery Impedance: 100 Ohm
Battery Remaining Longevity: 161 mo
Battery Voltage: 2.79 V
Brady Statistic AP VS Percent: 7 %
Brady Statistic AS VP Percent: 0 %
Lead Channel Impedance Value: 497 Ohm
Lead Channel Pacing Threshold Amplitude: 0.5 V
Lead Channel Pacing Threshold Amplitude: 0.5 V
Lead Channel Sensing Intrinsic Amplitude: 2 mV
Lead Channel Setting Pacing Amplitude: 2.5 V
Lead Channel Setting Pacing Pulse Width: 0.4 ms
MDC IDC MSMT LEADCHNL RA PACING THRESHOLD PULSEWIDTH: 0.4 ms
MDC IDC MSMT LEADCHNL RV IMPEDANCE VALUE: 591 Ohm
MDC IDC MSMT LEADCHNL RV PACING THRESHOLD PULSEWIDTH: 0.4 ms
MDC IDC MSMT LEADCHNL RV SENSING INTR AMPL: 11.2 mV
MDC IDC SESS DTM: 20150504104833
MDC IDC SET LEADCHNL RA PACING AMPLITUDE: 2 V
MDC IDC SET LEADCHNL RV SENSING SENSITIVITY: 4 mV
MDC IDC STAT BRADY AP VP PERCENT: 2 %
MDC IDC STAT BRADY AS VS PERCENT: 90 %

## 2013-07-06 NOTE — Progress Notes (Signed)
Pacemaker check in clinic. Normal device function. Thresholds, sensing, impedances consistent with previous measurements. Device programmed to maximize longevity. 34 mode switches(<0.1%)---8 AHR episodes recorded---max dur. 7 mins, Max A 272, Max V 124, 7/8 on 1-7----FFRW/ST w/FFRW. No high ventricular rates noted. Device programmed at appropriate safety margins. Histogram distribution appropriate for patient activity level. Device programmed to optimize intrinsic conduction. Estimated longevity 13.5 years. Patient will follow up via [facility] in ~1 week(initial transmission) and with GT on 8-6.

## 2013-07-20 ENCOUNTER — Encounter: Payer: Self-pay | Admitting: Internal Medicine

## 2013-07-31 ENCOUNTER — Encounter (HOSPITAL_COMMUNITY): Payer: Self-pay | Admitting: Emergency Medicine

## 2013-07-31 ENCOUNTER — Encounter (HOSPITAL_COMMUNITY)
Admission: EM | Disposition: A | Discharge status: Home / Self Care | Payer: Self-pay | Source: Home / Self Care | Attending: Emergency Medicine

## 2013-07-31 ENCOUNTER — Emergency Department (HOSPITAL_COMMUNITY): Payer: Medicare HMO

## 2013-07-31 ENCOUNTER — Emergency Department (HOSPITAL_COMMUNITY)
Admission: EM | Admit: 2013-07-31 | Discharge: 2013-07-31 | Disposition: A | Discharge status: Home / Self Care | Payer: Medicare HMO | Attending: Emergency Medicine | Admitting: Emergency Medicine

## 2013-07-31 ENCOUNTER — Ambulatory Visit (HOSPITAL_COMMUNITY): Admit: 2013-07-31 | Payer: Self-pay | Admitting: Cardiovascular Disease

## 2013-07-31 DIAGNOSIS — N189 Chronic kidney disease, unspecified: Secondary | ICD-10-CM | POA: Insufficient documentation

## 2013-07-31 DIAGNOSIS — Z794 Long term (current) use of insulin: Secondary | ICD-10-CM | POA: Insufficient documentation

## 2013-07-31 DIAGNOSIS — Z9861 Coronary angioplasty status: Secondary | ICD-10-CM | POA: Insufficient documentation

## 2013-07-31 DIAGNOSIS — G8929 Other chronic pain: Secondary | ICD-10-CM | POA: Insufficient documentation

## 2013-07-31 DIAGNOSIS — R52 Pain, unspecified: Secondary | ICD-10-CM | POA: Insufficient documentation

## 2013-07-31 DIAGNOSIS — Z8619 Personal history of other infectious and parasitic diseases: Secondary | ICD-10-CM | POA: Insufficient documentation

## 2013-07-31 DIAGNOSIS — J449 Chronic obstructive pulmonary disease, unspecified: Secondary | ICD-10-CM | POA: Insufficient documentation

## 2013-07-31 DIAGNOSIS — M549 Dorsalgia, unspecified: Secondary | ICD-10-CM | POA: Insufficient documentation

## 2013-07-31 DIAGNOSIS — E119 Type 2 diabetes mellitus without complications: Secondary | ICD-10-CM | POA: Insufficient documentation

## 2013-07-31 DIAGNOSIS — J4489 Other specified chronic obstructive pulmonary disease: Secondary | ICD-10-CM | POA: Insufficient documentation

## 2013-07-31 DIAGNOSIS — Z79899 Other long term (current) drug therapy: Secondary | ICD-10-CM | POA: Insufficient documentation

## 2013-07-31 DIAGNOSIS — Z87891 Personal history of nicotine dependence: Secondary | ICD-10-CM | POA: Insufficient documentation

## 2013-07-31 DIAGNOSIS — I129 Hypertensive chronic kidney disease with stage 1 through stage 4 chronic kidney disease, or unspecified chronic kidney disease: Secondary | ICD-10-CM | POA: Insufficient documentation

## 2013-07-31 DIAGNOSIS — Z7982 Long term (current) use of aspirin: Secondary | ICD-10-CM | POA: Insufficient documentation

## 2013-07-31 DIAGNOSIS — R739 Hyperglycemia, unspecified: Secondary | ICD-10-CM

## 2013-07-31 DIAGNOSIS — IMO0002 Reserved for concepts with insufficient information to code with codable children: Secondary | ICD-10-CM | POA: Insufficient documentation

## 2013-07-31 DIAGNOSIS — K219 Gastro-esophageal reflux disease without esophagitis: Secondary | ICD-10-CM | POA: Insufficient documentation

## 2013-07-31 DIAGNOSIS — M129 Arthropathy, unspecified: Secondary | ICD-10-CM | POA: Insufficient documentation

## 2013-07-31 LAB — COMPREHENSIVE METABOLIC PANEL
ALT: 35 U/L (ref 0–35)
AST: 26 U/L (ref 0–37)
Albumin: 3.9 g/dL (ref 3.5–5.2)
Alkaline Phosphatase: 80 U/L (ref 39–117)
BUN: 25 mg/dL — ABNORMAL HIGH (ref 6–23)
CO2: 31 mEq/L (ref 19–32)
Calcium: 9.5 mg/dL (ref 8.4–10.5)
Chloride: 91 mEq/L — ABNORMAL LOW (ref 96–112)
Creatinine, Ser: 0.87 mg/dL (ref 0.50–1.10)
GFR calc Af Amer: 83 mL/min — ABNORMAL LOW (ref >90)
GFR, EST NON AFRICAN AMERICAN: 72 mL/min — AB (ref >90)
GLUCOSE: 389 mg/dL — AB (ref 70–99)
Potassium: 4 mEq/L (ref 3.7–5.3)
SODIUM: 138 meq/L (ref 137–147)
TOTAL PROTEIN: 7 g/dL (ref 6.0–8.3)
Total Bilirubin: 0.2 mg/dL — ABNORMAL LOW (ref 0.3–1.2)

## 2013-07-31 LAB — CBC
HEMATOCRIT: 38.3 % (ref 36.0–46.0)
Hemoglobin: 12.4 g/dL (ref 12.0–15.0)
MCH: 30.2 pg (ref 26.0–34.0)
MCHC: 32.4 g/dL (ref 30.0–36.0)
MCV: 93.2 fL (ref 78.0–100.0)
Platelets: 278 10*3/uL (ref 150–400)
RBC: 4.11 MIL/uL (ref 3.87–5.11)
RDW: 12.9 % (ref 11.5–15.5)
WBC: 8.6 10*3/uL (ref 4.0–10.5)

## 2013-07-31 LAB — I-STAT CHEM 8, ED
BUN: 26 mg/dL — AB (ref 6–23)
CALCIUM ION: 1.08 mmol/L — AB (ref 1.12–1.23)
Chloride: 90 mEq/L — ABNORMAL LOW (ref 96–112)
Creatinine, Ser: 1 mg/dL (ref 0.50–1.10)
Glucose, Bld: 389 mg/dL — ABNORMAL HIGH (ref 70–99)
HCT: 42 % (ref 36.0–46.0)
Hemoglobin: 14.3 g/dL (ref 12.0–15.0)
Potassium: 3.7 mEq/L (ref 3.7–5.3)
Sodium: 138 mEq/L (ref 137–147)
TCO2: 34 mmol/L (ref 0–100)

## 2013-07-31 LAB — I-STAT TROPONIN, ED: Troponin i, poc: 0 ng/mL (ref 0.00–0.08)

## 2013-07-31 LAB — CBG MONITORING, ED: GLUCOSE-CAPILLARY: 191 mg/dL — AB (ref 70–99)

## 2013-07-31 SURGERY — LEFT HEART CATHETERIZATION WITH CORONARY ANGIOGRAM
Anesthesia: LOCAL

## 2013-07-31 MED ORDER — HYDROMORPHONE HCL 4 MG PO TABS: 4.0000 mg | ORAL_TABLET | ORAL | Status: DC | PRN

## 2013-07-31 MED ORDER — INSULIN ASPART 100 UNIT/ML ~~LOC~~ SOLN
10.0000 [IU] | Freq: Once | SUBCUTANEOUS | Status: AC
  Administered 2013-07-31: 10 [IU] via SUBCUTANEOUS
  Filled 2013-07-31: qty 1

## 2013-07-31 MED ORDER — FENTANYL CITRATE 0.05 MG/ML IJ SOLN
100.0000 ug | Freq: Once | INTRAMUSCULAR | Status: AC
  Administered 2013-07-31: 100 ug via INTRAVENOUS

## 2013-07-31 MED ORDER — HYDROMORPHONE HCL PF 1 MG/ML IJ SOLN
1.0000 mg | Freq: Once | INTRAMUSCULAR | Status: AC
  Administered 2013-07-31: 1 mg via INTRAVENOUS
  Filled 2013-07-31: qty 1

## 2013-07-31 MED ORDER — IOHEXOL 350 MG/ML SOLN
100.0000 mL | Freq: Once | INTRAVENOUS | Status: AC | PRN
  Administered 2013-07-31: 100 mL via INTRAVENOUS

## 2013-07-31 MED ORDER — FENTANYL CITRATE 0.05 MG/ML IJ SOLN
INTRAMUSCULAR | Status: AC
  Filled 2013-07-31: qty 2

## 2013-07-31 NOTE — ED Notes (Signed)
Per amanda with cath lab, stemi cancelled. Dr Myrtis Ser at bedside with patient.

## 2013-07-31 NOTE — ED Provider Notes (Signed)
CSN: [id number]     Arrival date & time 07/31/13  1114 History   First MD Initiated Contact with Patient 07/31/13 1127     Chief Complaint  Patient presents with  . Code STEMI     (Consider location/radiation/quality/duration/timing/severity/associated sxs/prior Treatment) HPI 58 year old female with history of COPD coronary artery disease chronic generalized pain chronic back pain chronic pain to all 4 extremities presents with 2 weeks of mid back pain occurring as sudden sharp stabbing episodes lasting just a few seconds each occurring multiple times per hour not related to activity with no fever no cough no chest pain no shortness of breath no abdominal pain no new focal weakness or numbness or other concerns and pain not improved with baseline hydrocodone; a code STEMI was activated in the field by EMS which was canceled by cardiology shortly after patient arrival to the emergency department. No change in bowel or bladder function. Past Medical History  Diagnosis Date  . COPD (chronic obstructive pulmonary disease)   . Chronic respiratory failure   . Morbid obesity   . Tobacco abuse   . GERD (gastroesophageal reflux disease)   . Hyperlipidemia   . Chronic pain   . Hypertension   . Coronary vasospasm     a. 03/2004 NSTEMI/Cath: EF 45%, LM nl, LAD nl, D1 nl, D2 nl, LCX nl, OM1 large-nl, RCA-spasm noted, otw nl.  . Arthritis   . Allergic rhinitis   . Diabetes mellitus type 2 in obese 02/29/2012  . HTN (hypertension) 02/29/2012  . 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 recurrent in setting of abx noncompliance.  . Chronic kidney disease 09/2012    acute renal failure  . Shock 09/2012  . H1N1 influenza    Past Surgical History  Procedure Laterality Date  . Cholecystectomy    . Partial hysterectomy    . Angioplasty    . Umbilical hernia repair  12/2009   History reviewed. No pertinent family history. History  Substance Use Topics  . Smoking status: Former Smoker -- 2.00 packs/day  for 40 years    Types: Cigarettes    Quit date: 03/04/2012  . Smokeless tobacco: Never Used  . Alcohol Use: No   OB History   Grav Para Term Preterm Abortions TAB SAB Ect Mult Living                 Review of Systems 10 Systems reviewed and are negative for acute change except as noted in the HPI.   Allergies  Ambien; Ciprofloxacin; Actos; Lunesta; and Protonix  Home Medications   Prior to Admission medications   Medication Sig Start Date End Date Taking? Authorizing Provider  aspirin EC 81 MG tablet Take 81 mg by mouth 2 (two) times daily.   Yes Historical Provider, MD  budesonide-formoterol (SYMBICORT) 160-4.5 MCG/ACT inhaler Inhale 2 puffs into the lungs 2 (two) times daily.   Yes Historical Provider, MD  digoxin (LANOXIN) 0.25 MG tablet Take 1 tablet (0.25 mg total) by mouth daily. 10/30/12  Yes Simonne Martinet, NP  diltiazem (TIAZAC) 300 MG 24 hr capsule Take 300 mg by mouth daily.    Yes Historical Provider, MD  famotidine (PEPCID) 20 MG tablet Take 1 tablet (20 mg total) by mouth at bedtime. 09/16/12  Yes Vassie Loll, MD  furosemide (LASIX) 40 MG tablet Take 40 mg by mouth 2 (two) times daily.  11/27/12  Yes Historical Provider, MD  HYDROcodone-acetaminophen (NORCO) 10-325 MG per tablet Take 1 tablet by mouth every  4 (four) hours as needed for moderate pain.  10/06/12  Yes Historical Provider, MD  insulin aspart (NOVOLOG) 100 UNIT/ML injection Inject 3-8 Units into the skin 3 (three) times daily before meals. 150-200=3 units      anything >300=8 units   Yes Historical Provider, MD  metFORMIN (GLUCOPHAGE) 1000 MG tablet Take 1,000 mg by mouth 2 (two) times daily with a meal.   Yes Historical Provider, MD  methocarbamol (ROBAXIN) 750 MG tablet Take 750 mg by mouth 4 (four) times daily.   Yes Historical Provider, MD  rOPINIRole (REQUIP) 1 MG tablet Take 1 mg by mouth at bedtime.   Yes Historical Provider, MD  sertraline (ZOLOFT) 50 MG tablet Take 50 mg by mouth daily.   Yes  Historical Provider, MD  HYDROmorphone (DILAUDID) 4 MG tablet Take 1 tablet (4 mg total) by mouth every 4 (four) hours as needed for severe pain. 07/31/13   Hurman Horn, MD   BP 148/66  Pulse 60  Temp(Src) 98.8 F (37.1 C) (Oral)  Resp 18  Ht 5\' 6"  (1.676 m)  Wt 203 lb (92.08 kg)  BMI 32.78 kg/m2  SpO2 100% Physical Exam  Nursing note and vitals reviewed. Constitutional:  Awake, alert, nontoxic appearance.  HENT:  Head: Atraumatic.  Eyes: Right eye exhibits no discharge. Left eye exhibits no discharge.  Neck: Neck supple.  Cardiovascular: Normal rate and regular rhythm.   No murmur heard. Pulmonary/Chest: Effort normal and breath sounds normal. No respiratory distress. She has no wheezes. She has no rales. She exhibits no tenderness.  Abdominal: Soft. Bowel sounds are normal. She exhibits no distension. There is no tenderness. There is no rebound and no guarding.  Musculoskeletal: She exhibits tenderness. She exhibits no edema.  Baseline ROM, no obvious new focal weakness. Mild mid back tenderness. Bilateral lower extremities nontender with intact symmetric knee jerk and ankle jerk reflexes with normal light touch to both legs and feet with 5 out of 5 strength in both legs for hip flexion, quadriceps, hamstrings, extensor hallucis longus, foot dorsiflexion, foot plantar flexion  Neurological: She is alert.  Mental status and motor strength appears baseline for patient and situation.  Skin: No rash noted.  Psychiatric: She has a normal mood and affect.    ED Course  Procedures (including critical care time) Pt stable in ED with no significant deterioration in condition.Patient / Family / Caregiver informed of clinical course, understand medical decision-making process, and agree with plan.Pt with normal gait in ED. Labs Review Labs Reviewed  COMPREHENSIVE METABOLIC PANEL - Abnormal; Notable for the following:    Chloride 91 (*)    Glucose, Bld 389 (*)    BUN 25 (*)    Total  Bilirubin 0.2 (*)    GFR calc non Af Amer 72 (*)    GFR calc Af Amer 83 (*)    All other components within normal limits  I-STAT CHEM 8, ED - Abnormal; Notable for the following:    Chloride 90 (*)    BUN 26 (*)    Glucose, Bld 389 (*)    Calcium, Ion 1.08 (*)    All other components within normal limits  CBG MONITORING, ED - Abnormal; Notable for the following:    Glucose-Capillary 191 (*)    All other components within normal limits  CBC  I-STAT TROPOININ, ED    Imaging Review Dg Chest Port 1 View  07/31/2013   CLINICAL DATA:  Chest pain and difficulty breathing  EXAM: PORTABLE CHEST -  1 VIEW  COMPARISON:  May 14, 2013  FINDINGS: There is no edema or consolidation. Heart is upper normal in size with normal pulmonary vascularity. No adenopathy. Pacemaker leads are attached to the right atrium and right ventricle.  IMPRESSION: No edema or consolidation.   Electronically Signed   By: Bretta BangWilliam  Woodruff M.D.   On: 07/31/2013 12:40   Ct Angio Chest Aortic Dissect W &/or W/o  07/31/2013   CLINICAL DATA:  Back pain and clinical signs of possible aortic dissection.  EXAM: CT ANGIOGRAPHY CHEST, ABDOMEN AND PELVIS  TECHNIQUE: Multidetector CT imaging through the chest, abdomen and pelvis was performed using the standard protocol during bolus administration of intravenous contrast. Multiplanar reconstructed images and MIPs were obtained and reviewed to evaluate the vascular anatomy.  CONTRAST:  100mL OMNIPAQUE IOHEXOL 350 MG/ML SOLN  COMPARISON:  None.  FINDINGS: CTA CHEST FINDINGS  The thoracic aorta is of normal caliber. There is no evidence of aortic dissection. The proximal great vessels show normal branching anatomy and patency. The pulmonary arteries are also well opacified on the study and showed normal caliber without evidence of pulmonary embolism.  The heart size is normal. Calcified plaque is identified in the distribution of the LAD and left circumflex coronary arteries. A pacemaker is  present. No pleural or pericardial fluid is seen. No infiltrates, edema, nodules or pneumothorax. No enlarged lymph nodes are seen. Diffuse degenerative changes are present throughout the spine. There is incidental defect of the posterior right hemidiaphragm with protrusion of fat consistent with a Bochdalek hernia.  Review of the MIP images confirms the above findings.  CTA ABDOMEN AND PELVIS FINDINGS  The abdominal aorta shows no evidence of aneurysmal disease or dissection. Atherosclerotic plaque is present in the distal aorta. The celiac axis, superior mesenteric artery, bilateral single renal arteries and inferior mesenteric artery show normal patency.  Mild atherosclerosis of bilateral iliac arteries present without evidence of stenosis or aneurysm. The common femoral arteries and femoral bifurcations are normally patent.  Nonvascular evaluation demonstrates diffuse hepatic steatosis with probable early cirrhotic changes based on left lobe and caudate enlargement. A small recanalized umbilical vein is also present. No hepatic masses or biliary dilatation identified. Been removed. The pancreas, spleen, adrenal glands and kidneys are unremarkable. No abnormalities are seen involving unenhanced bowel loops. There is a small umbilical hernia containing fat. Small bilateral inguinal hernias also contain fat. No abnormal fluid collection. Status post hysterectomy. The bladder is unremarkable. Degenerative changes are present of the spine.  Review of the MIP images confirms the above findings.  IMPRESSION: 1. No evidence of acute aortic dissection or aneurysmal disease of the aorta in the chest or abdomen. 2. Coronary artery disease with calcified plaque in the distribution of the LAD and left circumflex coronary arteries. 3. Right diaphragmatic Bochdalek hernia containing fat. 4. Diffuse hepatic steatosis with probable early cirrhotic changes of the liver.   Electronically Signed   By: Irish LackGlenn  Yamagata M.D.   On:  07/31/2013 13:47   Ct Angio Abd/pel W/ And/or W/o  07/31/2013   CLINICAL DATA:  Back pain and clinical signs of possible aortic dissection.  EXAM: CT ANGIOGRAPHY CHEST, ABDOMEN AND PELVIS  TECHNIQUE: Multidetector CT imaging through the chest, abdomen and pelvis was performed using the standard protocol during bolus administration of intravenous contrast. Multiplanar reconstructed images and MIPs were obtained and reviewed to evaluate the vascular anatomy.  CONTRAST:  100mL OMNIPAQUE IOHEXOL 350 MG/ML SOLN  COMPARISON:  None.  FINDINGS: CTA CHEST FINDINGS  The thoracic aorta is of normal caliber. There is no evidence of aortic dissection. The proximal great vessels show normal branching anatomy and patency. The pulmonary arteries are also well opacified on the study and showed normal caliber without evidence of pulmonary embolism.  The heart size is normal. Calcified plaque is identified in the distribution of the LAD and left circumflex coronary arteries. A pacemaker is present. No pleural or pericardial fluid is seen. No infiltrates, edema, nodules or pneumothorax. No enlarged lymph nodes are seen. Diffuse degenerative changes are present throughout the spine. There is incidental defect of the posterior right hemidiaphragm with protrusion of fat consistent with a Bochdalek hernia.  Review of the MIP images confirms the above findings.  CTA ABDOMEN AND PELVIS FINDINGS  The abdominal aorta shows no evidence of aneurysmal disease or dissection. Atherosclerotic plaque is present in the distal aorta. The celiac axis, superior mesenteric artery, bilateral single renal arteries and inferior mesenteric artery show normal patency.  Mild atherosclerosis of bilateral iliac arteries present without evidence of stenosis or aneurysm. The common femoral arteries and femoral bifurcations are normally patent.  Nonvascular evaluation demonstrates diffuse hepatic steatosis with probable early cirrhotic changes based on left lobe  and caudate enlargement. A small recanalized umbilical vein is also present. No hepatic masses or biliary dilatation identified. Been removed. The pancreas, spleen, adrenal glands and kidneys are unremarkable. No abnormalities are seen involving unenhanced bowel loops. There is a small umbilical hernia containing fat. Small bilateral inguinal hernias also contain fat. No abnormal fluid collection. Status post hysterectomy. The bladder is unremarkable. Degenerative changes are present of the spine.  Review of the MIP images confirms the above findings.  IMPRESSION: 1. No evidence of acute aortic dissection or aneurysmal disease of the aorta in the chest or abdomen. 2. Coronary artery disease with calcified plaque in the distribution of the LAD and left circumflex coronary arteries. 3. Right diaphragmatic Bochdalek hernia containing fat. 4. Diffuse hepatic steatosis with probable early cirrhotic changes of the liver.   Electronically Signed   By: Irish Lack M.D.   On: 07/31/2013 13:47      EKG Interpretation   Date/Time:  Friday Jul 31 2013 11:21:52 EDT Ventricular Rate:  71 PR Interval:  152 QRS Duration: 150 QT Interval:  410 QTC Calculation: 445 R Axis:   -81 Text Interpretation:  Normal sinus rhythm Right bundle branch block Left  anterior fascicular block Bifascicular block Left ventricular  hypertrophy with repolarization abnormality Compared to previous tracing  Left ventricular hypertrophy NOW PRESENT Confirmed by Fonnie Jarvis  MD, [name]  [phone]) on 07/31/2013 6:18:06 PM      MDM   Final diagnoses:  Back pain  Hyperglycemia  Chronic pain    I doubt any other EMC precluding discharge at this time including, but not necessarily limited to the following:AMI, aortic dissection, PE.    Hurman Horn, MD 08/01/13 [phone]

## 2013-07-31 NOTE — ED Notes (Signed)
Patient here by [facility], pt complains of back pain intermittent x 2 weeks, today worse, 12 lead shows inferior MI. Recent pacemaker placement.

## 2013-07-31 NOTE — Discharge Instructions (Signed)
Metformin and X-ray Contrast Studies For some X-ray exams, a contrast dye is used. Contrast dye is a type of medicine used to make the X-ray image clearer. The contrast dye is given to the patient through a vein (intravenously). If you need to have this type of X-ray exam and you take a medication called metformin, your caregiver may have you stop taking metformin before the exam.  LACTIC ACIDOSIS In rare cases, a serious medical condition called lactic acidosis can develop in people who take metformin and receive contrast dye. The following conditions can increase the risk of this complication:   Kidney failure.  Liver problems.  Certain types of heart problems such as:  Heart failure.  Heart attack.  Heart infection.  Heart valve problems.  Alcohol abuse. If left untreated, lactic acidosis can lead to coma.  SYMPTOMS OF LACTIC ACIDOSIS Symptoms of lactic acidosis can include:  Rapid breathing (hyperventilation).  Neurologic symptoms such as:  Headaches.  Confusion.  Dizziness.  Excessive sweating.  Feeling sick to your stomach (nauseous) or throwing up (vomiting). AFTER THE X-RAY EXAM  Stay well-hydrated. Drink fluids as instructed by your caregiver.  If you have a risk of developing lactic acidosis, blood tests may be done to make sure your kidney function is okay.  Metformin is usually stopped for 48 hours after the X-ray exam. Ask your caregiver when you can start taking metformin again. SEEK MEDICAL CARE IF:   You have shortness of breath or difficulty breathing.  You develop a headache that does not go away.  You have nausea or vomiting.  You urinate more than normal.  You develop a skin rash and have:  Redness.  Swelling.  Itching. Document Released: 02/07/2009 Document Revised: 05/14/2011 Document Reviewed: 02/07/2009 [facility] Patient Information 2014 [place].  SEEK IMMEDIATE MEDICAL ATTENTION IF: New numbness, tingling, weakness, or  problem with the use of your arms or legs.  Severe back pain not relieved with medications.  Change in bowel or bladder control.  Increasing pain in any areas of the body (such as chest or abdominal pain).  Shortness of breath, dizziness or fainting.  Nausea (feeling sick to your stomach), vomiting, fever, or sweats.  Chronic Pain Discharge Instructions  Emergency care providers appreciate that many patients coming to us are in severe pain and we wish to address their pain in the safest, most responsible manner.  It is important to recognize however, that the proper treatment of chronic pain differs from that of the pain of injuries and acute illnesses.  Our goal is to provide quality, safe, personalized care and we thank you for giving us the opportunity to serve you. The use of narcotics and related agents for chronic pain syndromes may lead to additional physical and psychological problems.  Nearly as many people die from prescription narcotics each year as die from car crashes.  Additionally, this risk is increased if such prescriptions are obtained from a variety of sources.  Therefore, only your primary care physician or a pain management specialist is able to safely treat such syndromes with narcotic medications long-term.    Documentation revealing such prescriptions have been sought from multiple sources may prohibit us from providing a refill or different narcotic medication.  Your name may be checked first through the [facility] Controlled Substances Reporting System.  This database is a record of controlled substance medication prescriptions that the patient has received.  This has been established by [facility] in an effort to eliminate the dangerous, and  often life threatening, practice of obtaining multiple prescriptions from different medical providers.   If you have a chronic pain syndrome (i.e. chronic headaches, recurrent back or neck pain, dental pain, abdominal or pelvis  pain without a specific diagnosis, or neuropathic pain such as fibromyalgia) or recurrent visits for the same condition without an acute diagnosis, you may be treated with non-narcotics and other non-addictive medicines.  Allergic reactions or negative side effects that may be reported by a patient to such medications will not typically lead to the use of a narcotic analgesic or other controlled substance as an alternative.   Patients managing chronic pain with a personal physician should have provisions in place for breakthrough pain.  If you are in crisis, you should call your physician.  If your physician directs you to the emergency department, please have the doctor call and speak to our attending physician concerning your care.   When patients come to the Emergency Department (ED) with acute medical conditions in which the Emergency Department physician feels appropriate to prescribe narcotic or sedating pain medication, the physician will prescribe these in very limited quantities.  The amount of these medications will last only until you can see your primary care physician in his/her office.  Any patient who returns to the ED seeking refills should expect only non-narcotic pain medications.   In the event of an acute medical condition exists and the emergency physician feels it is necessary that the patient be given a narcotic or sedating medication -  a responsible adult driver should be present in the room prior to the medication being given by the nurse.   Prescriptions for narcotic or sedating medications that have been lost, stolen or expired will not be refilled in the Emergency Department.    Patients who have chronic pain may receive non-narcotic prescriptions until seen by their primary care physician.  It is every patients personal responsibility to maintain active prescriptions with his or her primary care physician or specialist.

## 2013-07-31 NOTE — ED Notes (Signed)
The pt up to the br c/o back pain

## 2013-07-31 NOTE — ED Notes (Addendum)
With ems pt was given 4 mg of morphine, 2 ntg, and 243 of asa. bp dropped with ntg, no diaphoresis or sob.

## 2013-07-31 NOTE — ED Notes (Signed)
MD at bedside. 

## 2013-07-31 NOTE — ED Notes (Signed)
The pt is resting.  She just received pain med .  Will check pain level soon

## 2013-07-31 NOTE — ED Notes (Signed)
CBG 191 

## 2013-10-08 ENCOUNTER — Ambulatory Visit (INDEPENDENT_AMBULATORY_CARE_PROVIDER_SITE_OTHER): Payer: Commercial Managed Care - HMO | Admitting: Internal Medicine

## 2013-10-08 ENCOUNTER — Encounter: Payer: Self-pay | Admitting: Internal Medicine

## 2013-10-08 VITALS — BP 90/62 | HR 64 | Ht 66.0 in | Wt 201.2 lb

## 2013-10-08 DIAGNOSIS — Z950 Presence of cardiac pacemaker: Secondary | ICD-10-CM

## 2013-10-08 DIAGNOSIS — I10 Essential (primary) hypertension: Secondary | ICD-10-CM

## 2013-10-08 DIAGNOSIS — I480 Paroxysmal atrial fibrillation: Secondary | ICD-10-CM

## 2013-10-08 DIAGNOSIS — I4891 Unspecified atrial fibrillation: Secondary | ICD-10-CM

## 2013-10-08 LAB — MDC_IDC_ENUM_SESS_TYPE_INCLINIC
Battery Impedance: 100 Ohm
Battery Remaining Longevity: 163 mo
Battery Voltage: 2.79 V
Brady Statistic AP VP Percent: 1 %
Lead Channel Impedance Value: 498 Ohm
Lead Channel Sensing Intrinsic Amplitude: 2 mV
Lead Channel Setting Pacing Amplitude: 2 V
Lead Channel Setting Pacing Amplitude: 2.5 V
Lead Channel Setting Pacing Pulse Width: 0.4 ms
Lead Channel Setting Sensing Sensitivity: 4 mV
MDC IDC MSMT LEADCHNL RA PACING THRESHOLD AMPLITUDE: 0.75 V
MDC IDC MSMT LEADCHNL RA PACING THRESHOLD PULSEWIDTH: 0.4 ms
MDC IDC MSMT LEADCHNL RV IMPEDANCE VALUE: 543 Ohm
MDC IDC MSMT LEADCHNL RV PACING THRESHOLD AMPLITUDE: 0.75 V
MDC IDC MSMT LEADCHNL RV PACING THRESHOLD PULSEWIDTH: 0.4 ms
MDC IDC MSMT LEADCHNL RV SENSING INTR AMPL: 11.2 mV
MDC IDC SESS DTM: 20150806143912
MDC IDC STAT BRADY AP VS PERCENT: 8 %
MDC IDC STAT BRADY AS VP PERCENT: 0 %
MDC IDC STAT BRADY AS VS PERCENT: 91 %

## 2013-10-08 MED ORDER — DIGOXIN 250 MCG PO TABS: 0.1250 mg | ORAL_TABLET | Freq: Every day | ORAL | Status: DC

## 2013-10-08 NOTE — Patient Instructions (Signed)
Your physician wants you to follow-up in: 12 months with Dr Court Joy will receive a reminder letter in the mail two months in advance. If you don't receive a letter, please call our office to schedule the follow-up appointment.  Remote monitoring is used to monitor your Pacemaker or ICD from home. This monitoring reduces the number of office visits required to check your device to one time per year. It allows [place] to keep an eye on the functioning of your device to ensure it is working properly. You are scheduled for a device check from home on 01/11/14. You may send your transmission at any time that day. If you have a wireless device, the transmission will be sent automatically. After your physician reviews your transmission, you will receive a postcard with your next transmission date.  Your physician has recommended you make the following change in your medication:  1) Decrease your Digoxin to 1/2 tablet daily

## 2013-10-08 NOTE — Assessment & Plan Note (Signed)
We considered reducing her dose of cardizem as her blood pressure is low. She states that two weeks ago her blood pressure was elevated.

## 2013-10-08 NOTE — Assessment & Plan Note (Signed)
She is maintaining NSR. Will reduce her dose of digoxin.

## 2013-10-08 NOTE — Progress Notes (Signed)
HPI [name] returns today for followup. 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 Despite her multiple problems, she has gradually improved. She denies chest pain, or syncope. She has class II dyspnea. She has had no syncope. She is now back on home oxygen therapy. Allergies  Allergen Reactions  . Ambien [Zolpidem Tartrate] Other (See Comments)    Severe hallucinations and behavior changes reported by family  . Ciprofloxacin     PATIENT AT VERY HIGH RISK FOR C DIFFICILE COLITIS  . Actos [Pioglitazone] Swelling  . Lunesta [Eszopiclone] Other (See Comments)    Nightmares & hallucinations  . Protonix [Pantoprazole Sodium] Other (See Comments)    Unknown reaction-"Doctor told me to never take it"     Current Outpatient Prescriptions  Medication Sig Dispense Refill  . aspirin EC 81 MG tablet Take 81 mg by mouth 2 (two) times daily.      . budesonide-formoterol (SYMBICORT) 160-4.5 MCG/ACT inhaler Inhale 2 puffs into the lungs 2 (two) times daily.      . digoxin (LANOXIN) 0.25 MG tablet Take 0.5 tablets (0.125 mg total) by mouth daily. Take 1/2 tablet daily  45 tablet  3  . diltiazem (TIAZAC) 300 MG 24 hr capsule Take 300 mg by mouth daily.       . Exenatide (BYETTA 5 MCG PEN [place]) Inject 5 mLs into the skin 2 (two) times daily.      . famotidine (PEPCID) 20 MG tablet Take 1 tablet (20 mg total) by mouth at bedtime.  30 tablet  1  . furosemide (LASIX) 40 MG tablet Take 40 mg by mouth 2 (two) times daily.       Marland Kitchen. HYDROcodone-acetaminophen (NORCO) 10-325 MG per tablet Take 1 tablet by mouth every 4 (four) hours as needed for moderate pain.        Marland Kitchen. insulin aspart (NOVOLOG) 100 UNIT/ML injection Inject 3-8 Units into the skin 3 (three) times daily before meals. 150-200=3 units      anything >300=8 units      . insulin lispro (HUMALOG) 100 UNIT/ML injection Inject 30 Units into the skin 2 (two) times daily.      . metFORMIN (GLUCOPHAGE) 1000 MG tablet Take 1,000 mg by mouth 2 (two) times daily with a meal.      . rOPINIRole (REQUIP) 1 MG tablet Take 1 mg by mouth at bedtime.      . sertraline (ZOLOFT) 50 MG tablet Take 50 mg by mouth daily.       No current facility-administered medications for this visit.     Past Medical History  Diagnosis Date  . COPD (chronic obstructive pulmonary disease)   . Chronic respiratory failure   . Morbid obesity   . Tobacco abuse   . GERD (gastroesophageal reflux disease)   . Hyperlipidemia   . Chronic pain   . Hypertension   . Coronary vasospasm     a. 03/2004 NSTEMI/Cath: EF 45%, LM nl, LAD nl, D1 nl, D2 nl, LCX nl, OM1 large-nl, RCA-spasm noted, otw nl.  . Arthritis   . Allergic rhinitis   . Diabetes mellitus type 2 in obese 02/29/2012  . HTN (hypertension) 02/29/2012  . C. difficile diarrhea 09/2012  a. recurrent in setting of abx noncompliance.  . Chronic kidney disease 09/2012    acute renal failure  . Shock 09/2012  . H1N1 influenza     ROS:   All systems reviewed and negative except as noted in the HPI.   Past Surgical History  Procedure Laterality Date  . Cholecystectomy    . Partial hysterectomy    . Angioplasty    . Umbilical hernia repair  12/2009     No family history on file.   History   Social History  . Marital Status: Married    Spouse Name: N/A    Number of Children: 1  . Years of Education: N/A   Occupational History  . Environmental education officer   Social History Main Topics  . Smoking status: Former Smoker -- 2.00 packs/day for 40 years    Types: Cigarettes    Quit date: 03/04/2012  . Smokeless tobacco: Never Used  . Alcohol Use: No  . Drug  Use: No  . Sexual Activity: Not on file   Other Topics Concern  . Not on file   Social History Narrative   Lives in [place] with her husband.  She does not routinely exercise.     BP 90/62  Pulse 64  Ht 5\' 6"  (1.676 m)  Wt 201 lb 3.2 oz (91.264 kg)  BMI 32.49 kg/m2  Physical Exam:  Stable but chronically appearing middle-age woman,NAD HEENT: Unremarkable Neck:  7 cm JVD, no thyromegally, old tracheostomy scar, well-healed Back:  No CVA tenderness Lungs:  Clear, except for scattered rales in the bases, with no wheezes, or rhonchi. Well-healed pacemaker incision HEART:  Regular rate rhythm, no murmurs, no rubs, no clicks Abd:  soft, positive bowel sounds, no organomegally, no rebound, no guarding Ext:  2 plus pulses, no edema, no cyanosis, no clubbing Skin:  No rashes no nodules Neuro:  CN II through XII intact, motor grossly intact   DEVICE  Normal device function.  See PaceArt for details.   Assess/Plan:

## 2013-10-08 NOTE — Assessment & Plan Note (Signed)
Her Medtronic DDD PM is working normally. Will recheck in several months. 

## 2013-10-09 ENCOUNTER — Telehealth: Payer: Self-pay | Admitting: Pulmonary Disease

## 2013-10-09 DIAGNOSIS — J449 Chronic obstructive pulmonary disease, unspecified: Secondary | ICD-10-CM

## 2013-10-09 NOTE — Telephone Encounter (Signed)
Spoke with pt--requesting an order be sent to DME - [employer] for new O2 tank/concentrator Pt states that her hands have arthritis and it is becoming more difficulty to pull the large tank behind her. Requesting a lighter, smaller O2 tankconcentrator that can be carried on her shoulder.  Pt states that her current tank has malfunctioned and she is unable to use it--states that [employer] came out to her home a few days ago and placed a regulator on her tank and it is not working. Pt requests order be placed asap as she is stuck in her home because she is unable to go anywhere without O2  Please advise Dr Vassie Loll. Thanks.

## 2013-10-16 NOTE — Telephone Encounter (Signed)
Pt returning call.Sherry Sherry ° °

## 2013-10-16 NOTE — Telephone Encounter (Signed)
Pt aware order placed. She scheduled appt to see RA 10/20/13 AT 4 pm.  NOTHING FURTHER NEEDED

## 2013-10-16 NOTE — Telephone Encounter (Signed)
OK to place order Needs FU appt with me-can open 11 30 slot on Tuesday 18th

## 2013-10-16 NOTE — Telephone Encounter (Signed)
lmomtcb x1 

## 2013-10-20 ENCOUNTER — Ambulatory Visit: Payer: Commercial Managed Care - HMO | Admitting: Pulmonary Disease

## 2013-10-21 ENCOUNTER — Ambulatory Visit: Payer: Commercial Managed Care - HMO | Admitting: Pulmonary Disease

## 2013-10-23 ENCOUNTER — Telehealth: Payer: Self-pay | Admitting: Cardiology

## 2013-10-23 NOTE — Telephone Encounter (Signed)
Spoke with pt and informed her that we received her 1st transmission from her carelink smart on Jul 19, 2013 and that we need there to send the second transmission pt verbalized understanding.

## 2013-10-29 ENCOUNTER — Ambulatory Visit: Payer: Commercial Managed Care - HMO | Admitting: Pulmonary Disease

## 2014-01-11 ENCOUNTER — Ambulatory Visit (INDEPENDENT_AMBULATORY_CARE_PROVIDER_SITE_OTHER): Payer: Commercial Managed Care - HMO | Admitting: *Deleted

## 2014-01-11 ENCOUNTER — Telehealth: Payer: Self-pay | Admitting: Cardiology

## 2014-01-11 DIAGNOSIS — I480 Paroxysmal atrial fibrillation: Secondary | ICD-10-CM

## 2014-01-11 NOTE — Progress Notes (Signed)
Remote pacemaker transmission.   

## 2014-01-11 NOTE — Telephone Encounter (Signed)
Spoke with pt and reminded pt of remote transmission that is due today. Pt verbalized understanding.   

## 2014-01-15 LAB — MDC_IDC_ENUM_SESS_TYPE_REMOTE
Battery Remaining Longevity: 159 mo
Brady Statistic AP VP Percent: 0 %
Brady Statistic AS VS Percent: 95 %
Lead Channel Impedance Value: 522 Ohm
Lead Channel Pacing Threshold Pulse Width: 0.4 ms
Lead Channel Sensing Intrinsic Amplitude: 16 mV
Lead Channel Sensing Intrinsic Amplitude: 2.8 mV
Lead Channel Setting Pacing Amplitude: 2 V
Lead Channel Setting Sensing Sensitivity: 4 mV
MDC IDC MSMT BATTERY IMPEDANCE: 109 Ohm
MDC IDC MSMT BATTERY VOLTAGE: 2.79 V
MDC IDC MSMT LEADCHNL RA IMPEDANCE VALUE: 505 Ohm
MDC IDC MSMT LEADCHNL RA PACING THRESHOLD AMPLITUDE: 0.75 V
MDC IDC MSMT LEADCHNL RA PACING THRESHOLD PULSEWIDTH: 0.4 ms
MDC IDC MSMT LEADCHNL RV PACING THRESHOLD AMPLITUDE: 0.75 V
MDC IDC SESS DTM: 20151109175107
MDC IDC SET LEADCHNL RV PACING AMPLITUDE: 2.5 V
MDC IDC SET LEADCHNL RV PACING PULSEWIDTH: 0.4 ms
MDC IDC STAT BRADY AP VS PERCENT: 5 %
MDC IDC STAT BRADY AS VP PERCENT: 0 %

## 2014-01-21 ENCOUNTER — Encounter: Payer: Self-pay | Admitting: Cardiology

## 2014-01-26 ENCOUNTER — Encounter: Payer: Self-pay | Admitting: Internal Medicine

## 2014-02-11 ENCOUNTER — Encounter (HOSPITAL_COMMUNITY): Payer: Self-pay | Admitting: Internal Medicine

## 2014-04-14 ENCOUNTER — Telehealth: Payer: Self-pay | Admitting: Cardiology

## 2014-04-14 ENCOUNTER — Ambulatory Visit (INDEPENDENT_AMBULATORY_CARE_PROVIDER_SITE_OTHER): Payer: Commercial Managed Care - HMO | Admitting: *Deleted

## 2014-04-14 DIAGNOSIS — I480 Paroxysmal atrial fibrillation: Secondary | ICD-10-CM

## 2014-04-14 LAB — MDC_IDC_ENUM_SESS_TYPE_REMOTE
Battery Impedance: 109 Ohm
Battery Remaining Longevity: 160 mo
Brady Statistic AS VS Percent: 95 %
Lead Channel Impedance Value: 490 Ohm
Lead Channel Impedance Value: 519 Ohm
Lead Channel Pacing Threshold Amplitude: 0.875 V
Lead Channel Pacing Threshold Pulse Width: 0.4 ms
Lead Channel Setting Pacing Amplitude: 2 V
Lead Channel Setting Pacing Amplitude: 2.5 V
Lead Channel Setting Pacing Pulse Width: 0.4 ms
Lead Channel Setting Sensing Sensitivity: 4 mV
MDC IDC MSMT BATTERY VOLTAGE: 2.79 V
MDC IDC MSMT LEADCHNL RA PACING THRESHOLD AMPLITUDE: 0.75 V
MDC IDC MSMT LEADCHNL RA PACING THRESHOLD PULSEWIDTH: 0.4 ms
MDC IDC MSMT LEADCHNL RA SENSING INTR AMPL: 0.7 mV
MDC IDC MSMT LEADCHNL RV SENSING INTR AMPL: 11.2 mV
MDC IDC SESS DTM: 20160210165036
MDC IDC STAT BRADY AP VP PERCENT: 0 %
MDC IDC STAT BRADY AP VS PERCENT: 4 %
MDC IDC STAT BRADY AS VP PERCENT: 0 %

## 2014-04-14 NOTE — Telephone Encounter (Signed)
Spoke with pt and reminded pt of remote transmission that is due today. Pt verbalized understanding.   

## 2014-04-14 NOTE — Progress Notes (Signed)
Remote pacemaker transmission.   

## 2014-04-16 ENCOUNTER — Emergency Department (HOSPITAL_COMMUNITY)
Admission: EM | Admit: 2014-04-16 | Discharge: 2014-04-16 | Disposition: A | Discharge status: Home / Self Care | Payer: Commercial Managed Care - HMO | Attending: Emergency Medicine | Admitting: Emergency Medicine

## 2014-04-16 ENCOUNTER — Encounter (HOSPITAL_COMMUNITY): Payer: Self-pay | Admitting: Emergency Medicine

## 2014-04-16 DIAGNOSIS — I129 Hypertensive chronic kidney disease with stage 1 through stage 4 chronic kidney disease, or unspecified chronic kidney disease: Secondary | ICD-10-CM | POA: Diagnosis not present

## 2014-04-16 DIAGNOSIS — G8929 Other chronic pain: Secondary | ICD-10-CM | POA: Insufficient documentation

## 2014-04-16 DIAGNOSIS — M1990 Unspecified osteoarthritis, unspecified site: Secondary | ICD-10-CM | POA: Insufficient documentation

## 2014-04-16 DIAGNOSIS — N189 Chronic kidney disease, unspecified: Secondary | ICD-10-CM | POA: Insufficient documentation

## 2014-04-16 DIAGNOSIS — K5900 Constipation, unspecified: Secondary | ICD-10-CM | POA: Insufficient documentation

## 2014-04-16 DIAGNOSIS — Z79899 Other long term (current) drug therapy: Secondary | ICD-10-CM | POA: Insufficient documentation

## 2014-04-16 DIAGNOSIS — I4891 Unspecified atrial fibrillation: Secondary | ICD-10-CM | POA: Insufficient documentation

## 2014-04-16 DIAGNOSIS — Z8619 Personal history of other infectious and parasitic diseases: Secondary | ICD-10-CM | POA: Insufficient documentation

## 2014-04-16 DIAGNOSIS — Y9389 Activity, other specified: Secondary | ICD-10-CM | POA: Diagnosis not present

## 2014-04-16 DIAGNOSIS — Z7951 Long term (current) use of inhaled steroids: Secondary | ICD-10-CM | POA: Insufficient documentation

## 2014-04-16 DIAGNOSIS — Z87891 Personal history of nicotine dependence: Secondary | ICD-10-CM | POA: Diagnosis not present

## 2014-04-16 DIAGNOSIS — R197 Diarrhea, unspecified: Secondary | ICD-10-CM | POA: Insufficient documentation

## 2014-04-16 DIAGNOSIS — Z792 Long term (current) use of antibiotics: Secondary | ICD-10-CM | POA: Insufficient documentation

## 2014-04-16 DIAGNOSIS — Z9981 Dependence on supplemental oxygen: Secondary | ICD-10-CM | POA: Insufficient documentation

## 2014-04-16 DIAGNOSIS — E785 Hyperlipidemia, unspecified: Secondary | ICD-10-CM | POA: Diagnosis not present

## 2014-04-16 DIAGNOSIS — E119 Type 2 diabetes mellitus without complications: Secondary | ICD-10-CM | POA: Insufficient documentation

## 2014-04-16 DIAGNOSIS — S3141XA Laceration without foreign body of vagina and vulva, initial encounter: Secondary | ICD-10-CM | POA: Insufficient documentation

## 2014-04-16 DIAGNOSIS — S3993XA Unspecified injury of pelvis, initial encounter: Secondary | ICD-10-CM | POA: Diagnosis present

## 2014-04-16 DIAGNOSIS — Y998 Other external cause status: Secondary | ICD-10-CM | POA: Diagnosis not present

## 2014-04-16 DIAGNOSIS — K219 Gastro-esophageal reflux disease without esophagitis: Secondary | ICD-10-CM | POA: Insufficient documentation

## 2014-04-16 DIAGNOSIS — J441 Chronic obstructive pulmonary disease with (acute) exacerbation: Secondary | ICD-10-CM | POA: Insufficient documentation

## 2014-04-16 DIAGNOSIS — Z7982 Long term (current) use of aspirin: Secondary | ICD-10-CM | POA: Diagnosis not present

## 2014-04-16 DIAGNOSIS — X58XXXA Exposure to other specified factors, initial encounter: Secondary | ICD-10-CM | POA: Diagnosis not present

## 2014-04-16 DIAGNOSIS — Y92091 Bathroom in other non-institutional residence as the place of occurrence of the external cause: Secondary | ICD-10-CM | POA: Diagnosis not present

## 2014-04-16 DIAGNOSIS — R05 Cough: Secondary | ICD-10-CM | POA: Insufficient documentation

## 2014-04-16 DIAGNOSIS — Z794 Long term (current) use of insulin: Secondary | ICD-10-CM | POA: Diagnosis not present

## 2014-04-16 LAB — URINALYSIS, ROUTINE W REFLEX MICROSCOPIC
BILIRUBIN URINE: NEGATIVE
Glucose, UA: >1000 mg/dL — AB
Ketones, ur: NEGATIVE mg/dL
NITRITE: NEGATIVE
PH: 6 (ref 5.0–8.0)
PROTEIN: 30 mg/dL — AB
SPECIFIC GRAVITY, URINE: 1.026 (ref 1.005–1.030)
UROBILINOGEN UA: 0.2 mg/dL (ref 0.0–1.0)

## 2014-04-16 LAB — URINE MICROSCOPIC-ADD ON

## 2014-04-16 LAB — COMPREHENSIVE METABOLIC PANEL
ALBUMIN: 4.1 g/dL (ref 3.5–5.2)
ALT: 39 U/L — ABNORMAL HIGH (ref 0–35)
ANION GAP: 13 (ref 5–15)
AST: 32 U/L (ref 0–37)
Alkaline Phosphatase: 65 U/L (ref 39–117)
BUN: 27 mg/dL — ABNORMAL HIGH (ref 6–23)
CO2: 32 mmol/L (ref 19–32)
CREATININE: 1.08 mg/dL (ref 0.50–1.10)
Calcium: 9.3 mg/dL (ref 8.4–10.5)
Chloride: 89 mmol/L — ABNORMAL LOW (ref 96–112)
GFR calc Af Amer: 64 mL/min — ABNORMAL LOW (ref >90)
GFR calc non Af Amer: 55 mL/min — ABNORMAL LOW (ref >90)
Glucose, Bld: 424 mg/dL — ABNORMAL HIGH (ref 70–99)
Potassium: 3.9 mmol/L (ref 3.5–5.1)
Sodium: 134 mmol/L — ABNORMAL LOW (ref 135–145)
TOTAL PROTEIN: 7 g/dL (ref 6.0–8.3)
Total Bilirubin: 0.4 mg/dL (ref 0.3–1.2)

## 2014-04-16 LAB — CBC WITH DIFFERENTIAL/PLATELET
BASOS ABS: 0 10*3/uL (ref 0.0–0.1)
BASOS PCT: 0 % (ref 0–1)
EOS PCT: 1 % (ref 0–5)
Eosinophils Absolute: 0.1 10*3/uL (ref 0.0–0.7)
HCT: 35.3 % — ABNORMAL LOW (ref 36.0–46.0)
Hemoglobin: 11.3 g/dL — ABNORMAL LOW (ref 12.0–15.0)
LYMPHS ABS: 1.9 10*3/uL (ref 0.7–4.0)
Lymphocytes Relative: 20 % (ref 12–46)
MCH: 29.3 pg (ref 26.0–34.0)
MCHC: 32 g/dL (ref 30.0–36.0)
MCV: 91.5 fL (ref 78.0–100.0)
MONOS PCT: 6 % (ref 3–12)
Monocytes Absolute: 0.5 10*3/uL (ref 0.1–1.0)
Neutro Abs: 6.8 10*3/uL (ref 1.7–7.7)
Neutrophils Relative %: 73 % (ref 43–77)
PLATELETS: 268 10*3/uL (ref 150–400)
RBC: 3.86 MIL/uL — AB (ref 3.87–5.11)
RDW: 13.6 % (ref 11.5–15.5)
WBC: 9.3 10*3/uL (ref 4.0–10.5)

## 2014-04-16 NOTE — ED Notes (Signed)
Pt. reports vaginal bleeding onset this evening , denies pain or discomfort. No fever or chills.

## 2014-04-16 NOTE — Discharge Instructions (Signed)
The bleeding is coming from a small laceration on the entrance to the vagina. It did not appear to be bleeding anymore, but will likely bleed a small amount when you wipe for the next few days. You can use sitz baths, cool with ice packs, and take tylenol for the pain. No intercourse or objects into the vagina for several weeks to give it time to heal.  If the bleeding continues, you should be evaluated by your primary doctor. If the bleeding becomes heavy and you feel light headed, you should come back to the ED.

## 2014-04-16 NOTE — ED Provider Notes (Signed)
CSN: [id number]     Arrival date & time 04/16/14  1951 History   First MD Initiated Contact with Patient 04/16/14 2157     Chief Complaint  Patient presents with  . Vaginal Bleeding     (Consider location/radiation/quality/duration/timing/severity/associated sxs/prior Treatment) HPI Comments: Pt is a 59 y.o. female presents to ED with onset of what she believes is vaginal bleeding this evening. PMH significant for severe COPD on 3L continuous O2, prolonged hospital stays (>8 months and prolonged vent use), CHF, afib/aflutter not on anticoagulation, recurrent c. Diff. Pt states she had lost control of her bowel this evening and when she went to wipe in the bathroom noted to have bright red blood and stringy clots, believes to be coming from her vagina but she is not positive. Denies any prior episode of vaginal bleeding, blood in stool, hematuria. She is not on any blood thinners. She says she has had a partial hysterectomy >20 years ago, think she only has her ovaries left, but has not seen an OB/GYN or had a gyn exam in many years. She denies any current abdominal pain, but says she has had some epigastric pain she thinks is from a hernia she has had for a long time.   Patient is a 59 y.o. female presenting with vaginal bleeding. The history is provided by the patient. No language interpreter was used.  Vaginal Bleeding Quality:  Bright red and clots Severity:  Moderate Onset quality:  Sudden Duration:  2 hours Timing:  Intermittent Progression:  Unchanged Menstrual history:  Postmenopausal Associated symptoms: abdominal pain   Associated symptoms: no dizziness, no dysuria, no fever and no nausea     Past Medical History  Diagnosis Date  . COPD (chronic obstructive pulmonary disease)   . Chronic respiratory failure   . Morbid obesity   . Tobacco abuse   . GERD (gastroesophageal reflux disease)   . Hyperlipidemia   . Chronic pain   . Hypertension   . Coronary vasospasm     a.  03/2004 NSTEMI/Cath: EF 45%, LM nl, LAD nl, D1 nl, D2 nl, LCX nl, OM1 large-nl, RCA-spasm noted, otw nl.  . Arthritis   . Allergic rhinitis   . Diabetes mellitus type 2 in obese 02/29/2012  . HTN (hypertension) 02/29/2012  . 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 recurrent in setting of abx noncompliance.  . Chronic kidney disease 09/2012    acute renal failure  . Shock 09/2012  . H1N1 influenza    Past Surgical History  Procedure Laterality Date  . Cholecystectomy    . Partial hysterectomy    . Angioplasty    . Umbilical hernia repair  12/2009  . Permanent pacemaker insertion N/A 10/16/2012    Procedure: PERMANENT PACEMAKER INSERTION;  Surgeon: Marinus Maw, MD;  Location: [facility] CATH LAB;  Service: Cardiovascular;  Laterality: N/A;   No family history on file. History  Substance Use Topics  . Smoking status: Former Smoker -- 2.00 packs/day for 40 years    Types: Cigarettes    Quit date: 03/04/2012  . Smokeless tobacco: Never Used  . Alcohol Use: No   OB History    No data available     Review of Systems  Constitutional: Negative for fever and chills.  Respiratory: Positive for cough and shortness of breath (baseline).   Cardiovascular: Negative for chest pain, palpitations and leg swelling.  Gastrointestinal: Positive for abdominal pain, diarrhea and constipation. Negative for nausea, vomiting and rectal pain.  Genitourinary: Positive  for vaginal bleeding. Negative for dysuria and hematuria.  Skin: Negative for rash.  Neurological: Negative for dizziness and light-headedness.  All other systems reviewed and are negative.     Allergies  Ambien; Ciprofloxacin; Actos; Lunesta; and Protonix  Home Medications   Prior to Admission medications   Medication Sig Start Date End Date Taking? Authorizing Provider  albuterol (PROVENTIL HFA;VENTOLIN HFA) 108 (90 BASE) MCG/ACT inhaler Inhale 2 puffs into the lungs every 6 (six) hours as needed for wheezing or shortness of breath.   Yes  Historical Provider, MD  amoxicillin (AMOXIL) 500 MG capsule Take 500 mg by mouth 3 (three) times daily.   Yes Historical Provider, MD  aspirin EC 81 MG tablet Take 81 mg by mouth 2 (two) times daily.   Yes Historical Provider, MD  atorvastatin (LIPITOR) 10 MG tablet Take 10 mg by mouth at bedtime.   Yes Historical Provider, MD  budesonide-formoterol (SYMBICORT) 160-4.5 MCG/ACT inhaler Inhale 2 puffs into the lungs 2 (two) times daily.   Yes Historical Provider, MD  digoxin (LANOXIN) 0.25 MG tablet Take 0.5 tablets (0.125 mg total) by mouth daily. Take 1/2 tablet daily 10/08/13  Yes Marinus Maw, MD  diltiazem [facility]) 300 MG 24 hr capsule Take 300 mg by mouth daily.    Yes Historical Provider, MD  Exenatide (BYETTA 5 MCG PEN [place]) Inject 5 mLs into the skin 2 (two) times daily.   Yes Historical Provider, MD  famotidine (PEPCID) 20 MG tablet Take 1 tablet (20 mg total) by mouth at bedtime. 09/16/12  Yes Vassie Loll, MD  furosemide (LASIX) 40 MG tablet Take 40 mg by mouth 2 (two) times daily.  11/27/12  Yes Historical Provider, MD  HYDROcodone-acetaminophen (NORCO) 10-325 MG per tablet Take 1 tablet by mouth every 4 (four) hours as needed for moderate pain.  10/06/12  Yes Historical Provider, MD  insulin aspart (NOVOLOG) 100 UNIT/ML injection Inject 3-8 Units into the skin 3 (three) times daily before meals. 150-200=3 units      anything >300=8 units   Yes Historical Provider, MD  insulin lispro (HUMALOG) 100 UNIT/ML injection Inject 30 Units into the skin 2 (two) times daily.   Yes Historical Provider, MD  metFORMIN (GLUCOPHAGE) 1000 MG tablet Take 1,000 mg by mouth 2 (two) times daily with a meal.   Yes Historical Provider, MD  methocarbamol (ROBAXIN) 750 MG tablet Take 750 mg by mouth 4 (four) times daily as needed for muscle spasms.   Yes Historical Provider, MD  rOPINIRole (REQUIP) 1 MG tablet Take 1 mg by mouth at bedtime.   Yes Historical Provider, MD  sertraline (ZOLOFT) 50 MG tablet Take 50 mg by  mouth daily.   Yes Historical Provider, MD  traZODone (DESYREL) 50 MG tablet Take 50-100 mg by mouth at bedtime as needed for sleep.   Yes Historical Provider, MD   BP 152/75 mmHg  Pulse 87  Temp(Src) 98.3 F (36.8 C) (Oral)  Resp 16  SpO2 96% Physical Exam  Constitutional: She is oriented to person, place, and time. She appears well-developed and well-nourished.  HENT:  Head: Normocephalic and atraumatic.  Cardiovascular: Normal rate, regular rhythm, normal heart sounds and intact distal pulses.  Exam reveals no gallop and no friction rub.   No murmur heard. Pulmonary/Chest: Effort normal and breath sounds normal. No respiratory distress. She has no wheezes.  Abdominal: Soft. Bowel sounds are normal. She exhibits no distension. There is no tenderness. There is no rebound and no guarding.  obese  Genitourinary: Rectum normal. Rectal exam shows no mass and anal tone normal.     Normal mucous vaginal vault, no evidence of blood in vault.   Musculoskeletal: She exhibits no edema or tenderness.  Neurological: She is alert and oriented to person, place, and time.  Skin: Skin is warm and dry.  Psychiatric: She has a normal mood and affect. Her behavior is normal. Judgment and thought content normal.  Nursing note and vitals reviewed.   ED Course  Procedures (including critical care time) Labs Review Labs Reviewed  CBC WITH DIFFERENTIAL/PLATELET - Abnormal; Notable for the following:    RBC 3.86 (*)    Hemoglobin 11.3 (*)    HCT 35.3 (*)    All other components within normal limits  COMPREHENSIVE METABOLIC PANEL - Abnormal; Notable for the following:    Sodium 134 (*)    Chloride 89 (*)    Glucose, Bld 424 (*)    BUN 27 (*)    ALT 39 (*)    GFR calc non Af Amer 55 (*)    GFR calc Af Amer 64 (*)    All other components within normal limits  URINALYSIS, ROUTINE W REFLEX MICROSCOPIC - Abnormal; Notable for the following:    Glucose, UA >1000 (*)    Hgb urine dipstick LARGE (*)     Protein, ur 30 (*)    Leukocytes, UA SMALL (*)    All other components within normal limits  URINE MICROSCOPIC-ADD ON - Abnormal; Notable for the following:    Squamous Epithelial / LPF FEW (*)    All other components within normal limits  POC OCCULT BLOOD, ED  SAMPLE TO BLOOD BANK    Imaging Review No results found.   EKG Interpretation None      MDM   Final diagnoses:  Vaginal laceration, initial encounter   Hemodynamically stable. Hemoglobin appears stable compared to last result in May 2015. No current bleeding, on exam appears to have small superficial laceration at the introitus and it is hemostatic. Pt stable for discharge.    Nani Ravens, MD 04/16/14 1610  Nelia Shi, MD 04/17/14 [phone]

## 2014-04-17 LAB — SAMPLE TO BLOOD BANK

## 2014-04-22 ENCOUNTER — Encounter: Payer: Self-pay | Admitting: Cardiology

## 2014-04-27 ENCOUNTER — Encounter: Payer: Self-pay | Admitting: Internal Medicine

## 2014-07-14 ENCOUNTER — Telehealth: Payer: Self-pay | Admitting: Cardiology

## 2014-07-14 ENCOUNTER — Ambulatory Visit (INDEPENDENT_AMBULATORY_CARE_PROVIDER_SITE_OTHER): Payer: Medicare HMO | Admitting: *Deleted

## 2014-07-14 DIAGNOSIS — I480 Paroxysmal atrial fibrillation: Secondary | ICD-10-CM

## 2014-07-14 NOTE — Progress Notes (Signed)
Remote pacemaker transmission.   

## 2014-07-14 NOTE — Telephone Encounter (Signed)
LMOVM reminding pt to send remote transmission.   

## 2014-07-15 LAB — CUP PACEART REMOTE DEVICE CHECK
Battery Remaining Longevity: 152 mo
Brady Statistic AP VP Percent: 0 %
Brady Statistic AP VS Percent: 4 %
Brady Statistic AS VS Percent: 95 %
Date Time Interrogation Session: 20160511155155
Lead Channel Impedance Value: 476 Ohm
Lead Channel Impedance Value: 519 Ohm
Lead Channel Pacing Threshold Amplitude: 0.75 V
Lead Channel Pacing Threshold Amplitude: 0.875 V
Lead Channel Pacing Threshold Pulse Width: 0.4 ms
Lead Channel Pacing Threshold Pulse Width: 0.4 ms
Lead Channel Sensing Intrinsic Amplitude: 22.4 mV
Lead Channel Setting Pacing Amplitude: 2 V
Lead Channel Setting Sensing Sensitivity: 4 mV
MDC IDC MSMT BATTERY IMPEDANCE: 132 Ohm
MDC IDC MSMT BATTERY VOLTAGE: 2.79 V
MDC IDC MSMT LEADCHNL RA SENSING INTR AMPL: 2.8 mV
MDC IDC SET LEADCHNL RV PACING AMPLITUDE: 2.5 V
MDC IDC SET LEADCHNL RV PACING PULSEWIDTH: 0.4 ms
MDC IDC STAT BRADY AS VP PERCENT: 0 %

## 2014-07-23 ENCOUNTER — Encounter: Payer: Self-pay | Admitting: Cardiology

## 2014-07-27 ENCOUNTER — Encounter: Payer: Self-pay | Admitting: Internal Medicine

## 2014-08-18 ENCOUNTER — Ambulatory Visit: Payer: Medicare HMO | Admitting: Pulmonary Disease

## 2014-08-23 ENCOUNTER — Observation Stay (HOSPITAL_COMMUNITY)
Admission: EM | Admit: 2014-08-23 | Discharge: 2014-08-25 | Disposition: A | Discharge status: Home / Self Care | Payer: Medicare HMO | Attending: Oncology | Admitting: Oncology

## 2014-08-23 ENCOUNTER — Encounter (HOSPITAL_COMMUNITY): Payer: Self-pay | Admitting: Emergency Medicine

## 2014-08-23 ENCOUNTER — Emergency Department (HOSPITAL_COMMUNITY): Payer: Medicare HMO

## 2014-08-23 DIAGNOSIS — Z79899 Other long term (current) drug therapy: Secondary | ICD-10-CM | POA: Insufficient documentation

## 2014-08-23 DIAGNOSIS — G2581 Restless legs syndrome: Secondary | ICD-10-CM | POA: Diagnosis present

## 2014-08-23 DIAGNOSIS — J441 Chronic obstructive pulmonary disease with (acute) exacerbation: Principal | ICD-10-CM | POA: Diagnosis present

## 2014-08-23 DIAGNOSIS — M549 Dorsalgia, unspecified: Secondary | ICD-10-CM | POA: Diagnosis present

## 2014-08-23 DIAGNOSIS — E1169 Type 2 diabetes mellitus with other specified complication: Secondary | ICD-10-CM

## 2014-08-23 DIAGNOSIS — E785 Hyperlipidemia, unspecified: Secondary | ICD-10-CM | POA: Insufficient documentation

## 2014-08-23 DIAGNOSIS — R001 Bradycardia, unspecified: Secondary | ICD-10-CM | POA: Diagnosis not present

## 2014-08-23 DIAGNOSIS — Z87891 Personal history of nicotine dependence: Secondary | ICD-10-CM | POA: Diagnosis not present

## 2014-08-23 DIAGNOSIS — I4891 Unspecified atrial fibrillation: Secondary | ICD-10-CM | POA: Diagnosis not present

## 2014-08-23 DIAGNOSIS — Z888 Allergy status to other drugs, medicaments and biological substances status: Secondary | ICD-10-CM | POA: Diagnosis not present

## 2014-08-23 DIAGNOSIS — Z7982 Long term (current) use of aspirin: Secondary | ICD-10-CM | POA: Insufficient documentation

## 2014-08-23 DIAGNOSIS — Z950 Presence of cardiac pacemaker: Secondary | ICD-10-CM | POA: Diagnosis not present

## 2014-08-23 DIAGNOSIS — K219 Gastro-esophageal reflux disease without esophagitis: Secondary | ICD-10-CM | POA: Diagnosis not present

## 2014-08-23 DIAGNOSIS — I252 Old myocardial infarction: Secondary | ICD-10-CM | POA: Insufficient documentation

## 2014-08-23 DIAGNOSIS — E119 Type 2 diabetes mellitus without complications: Secondary | ICD-10-CM | POA: Insufficient documentation

## 2014-08-23 DIAGNOSIS — K529 Noninfective gastroenteritis and colitis, unspecified: Secondary | ICD-10-CM | POA: Diagnosis not present

## 2014-08-23 DIAGNOSIS — I2510 Atherosclerotic heart disease of native coronary artery without angina pectoris: Secondary | ICD-10-CM | POA: Diagnosis not present

## 2014-08-23 DIAGNOSIS — G8929 Other chronic pain: Secondary | ICD-10-CM | POA: Diagnosis not present

## 2014-08-23 DIAGNOSIS — A0471 Enterocolitis due to Clostridium difficile, recurrent: Secondary | ICD-10-CM | POA: Diagnosis present

## 2014-08-23 DIAGNOSIS — I480 Paroxysmal atrial fibrillation: Secondary | ICD-10-CM | POA: Diagnosis present

## 2014-08-23 DIAGNOSIS — A047 Enterocolitis due to Clostridium difficile: Secondary | ICD-10-CM | POA: Insufficient documentation

## 2014-08-23 DIAGNOSIS — I442 Atrioventricular block, complete: Secondary | ICD-10-CM | POA: Diagnosis not present

## 2014-08-23 DIAGNOSIS — Z9981 Dependence on supplemental oxygen: Secondary | ICD-10-CM | POA: Insufficient documentation

## 2014-08-23 DIAGNOSIS — I129 Hypertensive chronic kidney disease with stage 1 through stage 4 chronic kidney disease, or unspecified chronic kidney disease: Secondary | ICD-10-CM | POA: Diagnosis not present

## 2014-08-23 DIAGNOSIS — N189 Chronic kidney disease, unspecified: Secondary | ICD-10-CM | POA: Diagnosis not present

## 2014-08-23 DIAGNOSIS — Z794 Long term (current) use of insulin: Secondary | ICD-10-CM | POA: Diagnosis not present

## 2014-08-23 DIAGNOSIS — F17210 Nicotine dependence, cigarettes, uncomplicated: Secondary | ICD-10-CM | POA: Insufficient documentation

## 2014-08-23 DIAGNOSIS — I10 Essential (primary) hypertension: Secondary | ICD-10-CM | POA: Diagnosis present

## 2014-08-23 DIAGNOSIS — Z881 Allergy status to other antibiotic agents status: Secondary | ICD-10-CM | POA: Diagnosis not present

## 2014-08-23 DIAGNOSIS — E669 Obesity, unspecified: Secondary | ICD-10-CM

## 2014-08-23 HISTORY — DX: Reserved for inherently not codable concepts without codable children: IMO0001

## 2014-08-23 LAB — CBC WITH DIFFERENTIAL/PLATELET
BASOS ABS: 0 10*3/uL (ref 0.0–0.1)
Basophils Relative: 0 % (ref 0–1)
EOS ABS: 0 10*3/uL (ref 0.0–0.7)
Eosinophils Relative: 0 % (ref 0–5)
HCT: 37.7 % (ref 36.0–46.0)
HEMOGLOBIN: 11.3 g/dL — AB (ref 12.0–15.0)
LYMPHS ABS: 1.3 10*3/uL (ref 0.7–4.0)
LYMPHS PCT: 14 % (ref 12–46)
MCH: 26.5 pg (ref 26.0–34.0)
MCHC: 30 g/dL (ref 30.0–36.0)
MCV: 88.5 fL (ref 78.0–100.0)
Monocytes Absolute: 0.5 10*3/uL (ref 0.1–1.0)
Monocytes Relative: 6 % (ref 3–12)
NEUTROS PCT: 80 % — AB (ref 43–77)
Neutro Abs: 7.3 10*3/uL (ref 1.7–7.7)
Platelets: 299 10*3/uL (ref 150–400)
RBC: 4.26 MIL/uL (ref 3.87–5.11)
RDW: 15.5 % (ref 11.5–15.5)
WBC: 9.2 10*3/uL (ref 4.0–10.5)

## 2014-08-23 LAB — I-STAT TROPONIN, ED: Troponin i, poc: 0.03 ng/mL (ref 0.00–0.08)

## 2014-08-23 LAB — BASIC METABOLIC PANEL
ANION GAP: 13 (ref 5–15)
BUN: 22 mg/dL — AB (ref 6–20)
CHLORIDE: 87 mmol/L — AB (ref 101–111)
CO2: 34 mmol/L — AB (ref 22–32)
Calcium: 9.3 mg/dL (ref 8.9–10.3)
Creatinine, Ser: 0.95 mg/dL (ref 0.44–1.00)
Glucose, Bld: 223 mg/dL — ABNORMAL HIGH (ref 65–99)
POTASSIUM: 3.8 mmol/L (ref 3.5–5.1)
Sodium: 134 mmol/L — ABNORMAL LOW (ref 135–145)

## 2014-08-23 LAB — GLUCOSE, CAPILLARY
GLUCOSE-CAPILLARY: 179 mg/dL — AB (ref 65–99)
Glucose-Capillary: 509 mg/dL — ABNORMAL HIGH (ref 65–99)
Glucose-Capillary: 398 mg/dL — ABNORMAL HIGH (ref 65–99)

## 2014-08-23 LAB — CLOSTRIDIUM DIFFICILE BY PCR: CDIFFPCR: POSITIVE — AB

## 2014-08-23 LAB — BRAIN NATRIURETIC PEPTIDE: B NATRIURETIC PEPTIDE 5: 51.8 pg/mL (ref 0.0–100.0)

## 2014-08-23 LAB — DIGOXIN LEVEL: DIGOXIN LVL: 1.5 ng/mL (ref 0.8–2.0)

## 2014-08-23 MED ORDER — SODIUM CHLORIDE 0.9 % IJ SOLN
3.0000 mL | Freq: Two times a day (BID) | INTRAMUSCULAR | Status: DC
  Administered 2014-08-23 – 2014-08-25 (×5): 3 mL via INTRAVENOUS

## 2014-08-23 MED ORDER — DIGOXIN 125 MCG PO TABS
0.1250 mg | ORAL_TABLET | Freq: Every day | ORAL | Status: DC
  Administered 2014-08-24 – 2014-08-25 (×2): 0.125 mg via ORAL
  Filled 2014-08-23 (×2): qty 1

## 2014-08-23 MED ORDER — TRAZODONE HCL 100 MG PO TABS
100.0000 mg | ORAL_TABLET | Freq: Every evening | ORAL | Status: DC | PRN
  Administered 2014-08-23 – 2014-08-24 (×2): 100 mg via ORAL
  Filled 2014-08-23 (×3): qty 1

## 2014-08-23 MED ORDER — BUDESONIDE-FORMOTEROL FUMARATE 160-4.5 MCG/ACT IN AERO
2.0000 | INHALATION_SPRAY | Freq: Two times a day (BID) | RESPIRATORY_TRACT | Status: DC
  Administered 2014-08-23 – 2014-08-25 (×4): 2 via RESPIRATORY_TRACT
  Filled 2014-08-23: qty 6

## 2014-08-23 MED ORDER — LISINOPRIL 20 MG PO TABS
20.0000 mg | ORAL_TABLET | Freq: Every day | ORAL | Status: DC
  Administered 2014-08-24 – 2014-08-25 (×2): 20 mg via ORAL
  Filled 2014-08-23 (×2): qty 1

## 2014-08-23 MED ORDER — ASPIRIN EC 81 MG PO TBEC
81.0000 mg | DELAYED_RELEASE_TABLET | Freq: Two times a day (BID) | ORAL | Status: DC
  Administered 2014-08-23 – 2014-08-25 (×4): 81 mg via ORAL
  Filled 2014-08-23 (×6): qty 1

## 2014-08-23 MED ORDER — SACCHAROMYCES BOULARDII 250 MG PO CAPS
250.0000 mg | ORAL_CAPSULE | Freq: Two times a day (BID) | ORAL | Status: DC
  Administered 2014-08-23 – 2014-08-25 (×4): 250 mg via ORAL
  Filled 2014-08-23 (×5): qty 1

## 2014-08-23 MED ORDER — CEFUROXIME AXETIL 500 MG PO TABS
500.0000 mg | ORAL_TABLET | Freq: Two times a day (BID) | ORAL | Status: DC
  Administered 2014-08-23 – 2014-08-25 (×4): 500 mg via ORAL
  Filled 2014-08-23 (×5): qty 1

## 2014-08-23 MED ORDER — INSULIN ASPART 100 UNIT/ML ~~LOC~~ SOLN
0.0000 [IU] | Freq: Three times a day (TID) | SUBCUTANEOUS | Status: DC
  Administered 2014-08-23: 4 [IU] via SUBCUTANEOUS
  Administered 2014-08-24: 20 [IU] via SUBCUTANEOUS
  Administered 2014-08-24 (×2): 15 [IU] via SUBCUTANEOUS
  Administered 2014-08-25: 11 [IU] via SUBCUTANEOUS
  Administered 2014-08-25: 7 [IU] via SUBCUTANEOUS

## 2014-08-23 MED ORDER — INSULIN ASPART 100 UNIT/ML ~~LOC~~ SOLN
6.0000 [IU] | Freq: Three times a day (TID) | SUBCUTANEOUS | Status: DC
  Administered 2014-08-23 – 2014-08-24 (×2): 6 [IU] via SUBCUTANEOUS

## 2014-08-23 MED ORDER — SODIUM CHLORIDE 0.9 % IJ SOLN: 3.0000 mL | INTRAMUSCULAR | Status: DC | PRN

## 2014-08-23 MED ORDER — ALBUTEROL SULFATE (2.5 MG/3ML) 0.083% IN NEBU
5.0000 mg | INHALATION_SOLUTION | Freq: Once | RESPIRATORY_TRACT | Status: AC
  Administered 2014-08-23: 5 mg via RESPIRATORY_TRACT

## 2014-08-23 MED ORDER — ATORVASTATIN CALCIUM 10 MG PO TABS
10.0000 mg | ORAL_TABLET | Freq: Every day | ORAL | Status: DC
  Administered 2014-08-23 – 2014-08-24 (×2): 10 mg via ORAL
  Filled 2014-08-23 (×3): qty 1

## 2014-08-23 MED ORDER — IPRATROPIUM BROMIDE 0.02 % IN SOLN: 0.5000 mg | RESPIRATORY_TRACT | Status: DC

## 2014-08-23 MED ORDER — INSULIN ASPART 100 UNIT/ML ~~LOC~~ SOLN
0.0000 [IU] | Freq: Every day | SUBCUTANEOUS | Status: DC
  Administered 2014-08-23 – 2014-08-24 (×2): 5 [IU] via SUBCUTANEOUS

## 2014-08-23 MED ORDER — IPRATROPIUM BROMIDE 0.02 % IN SOLN
RESPIRATORY_TRACT | Status: AC
  Filled 2014-08-23: qty 2.5

## 2014-08-23 MED ORDER — PREDNISONE 50 MG PO TABS
60.0000 mg | ORAL_TABLET | Freq: Every day | ORAL | Status: DC
  Administered 2014-08-24: 60 mg via ORAL
  Filled 2014-08-23 (×2): qty 1

## 2014-08-23 MED ORDER — ALBUTEROL SULFATE (2.5 MG/3ML) 0.083% IN NEBU: 2.5000 mg | INHALATION_SOLUTION | RESPIRATORY_TRACT | Status: DC

## 2014-08-23 MED ORDER — ENOXAPARIN SODIUM 40 MG/0.4ML ~~LOC~~ SOLN
40.0000 mg | SUBCUTANEOUS | Status: DC
  Administered 2014-08-23 – 2014-08-24 (×2): 40 mg via SUBCUTANEOUS
  Filled 2014-08-23 (×3): qty 0.4

## 2014-08-23 MED ORDER — VANCOMYCIN 50 MG/ML ORAL SOLUTION
125.0000 mg | Freq: Four times a day (QID) | ORAL | Status: DC
  Administered 2014-08-23: 125 mg via ORAL
  Filled 2014-08-23 (×5): qty 2.5

## 2014-08-23 MED ORDER — BENZONATATE 100 MG PO CAPS
100.0000 mg | ORAL_CAPSULE | Freq: Three times a day (TID) | ORAL | Status: AC | PRN
  Administered 2014-08-23 – 2014-08-24 (×3): 100 mg via ORAL
  Filled 2014-08-23 (×4): qty 1

## 2014-08-23 MED ORDER — ROPINIROLE HCL 1 MG PO TABS
1.0000 mg | ORAL_TABLET | Freq: Every day | ORAL | Status: DC
  Administered 2014-08-23 – 2014-08-24 (×2): 1 mg via ORAL
  Filled 2014-08-23 (×3): qty 1

## 2014-08-23 MED ORDER — METHYLPREDNISOLONE SODIUM SUCC 125 MG IJ SOLR
125.0000 mg | Freq: Once | INTRAMUSCULAR | Status: AC
  Administered 2014-08-23: 125 mg via INTRAVENOUS
  Filled 2014-08-23: qty 2

## 2014-08-23 MED ORDER — ALBUTEROL SULFATE (2.5 MG/3ML) 0.083% IN NEBU: 4.0000 mg | INHALATION_SOLUTION | RESPIRATORY_TRACT | Status: DC

## 2014-08-23 MED ORDER — ALBUTEROL (5 MG/ML) CONTINUOUS INHALATION SOLN: 5.0000 mg/h | INHALATION_SOLUTION | Freq: Once | RESPIRATORY_TRACT | Status: DC

## 2014-08-23 MED ORDER — FLUTICASONE PROPIONATE 50 MCG/ACT NA SUSP
2.0000 | Freq: Every day | NASAL | Status: DC
  Administered 2014-08-24 – 2014-08-25 (×2): 2 via NASAL
  Filled 2014-08-23: qty 16

## 2014-08-23 MED ORDER — IPRATROPIUM-ALBUTEROL 0.5-2.5 (3) MG/3ML IN SOLN
3.0000 mL | RESPIRATORY_TRACT | Status: DC
  Administered 2014-08-23 – 2014-08-25 (×12): 3 mL via RESPIRATORY_TRACT
  Filled 2014-08-23 (×12): qty 3

## 2014-08-23 MED ORDER — HYDROCODONE-ACETAMINOPHEN 10-325 MG PO TABS
1.0000 | ORAL_TABLET | Freq: Four times a day (QID) | ORAL | Status: DC | PRN
  Administered 2014-08-23 – 2014-08-24 (×2): 1 via ORAL
  Filled 2014-08-23 (×2): qty 1

## 2014-08-23 MED ORDER — DILTIAZEM HCL ER BEADS 300 MG PO CP24
300.0000 mg | ORAL_CAPSULE | Freq: Every day | ORAL | Status: DC
  Administered 2014-08-24 – 2014-08-25 (×2): 300 mg via ORAL
  Filled 2014-08-23 (×2): qty 1

## 2014-08-23 MED ORDER — ALBUTEROL SULFATE (2.5 MG/3ML) 0.083% IN NEBU
INHALATION_SOLUTION | RESPIRATORY_TRACT | Status: AC
  Filled 2014-08-23: qty 6

## 2014-08-23 MED ORDER — SERTRALINE HCL 50 MG PO TABS
50.0000 mg | ORAL_TABLET | Freq: Every day | ORAL | Status: DC
  Administered 2014-08-24 – 2014-08-25 (×2): 50 mg via ORAL
  Filled 2014-08-23 (×2): qty 1

## 2014-08-23 MED ORDER — ALBUTEROL (5 MG/ML) CONTINUOUS INHALATION SOLN
10.0000 mg/h | INHALATION_SOLUTION | Freq: Once | RESPIRATORY_TRACT | Status: AC
  Administered 2014-08-23: 10 mg/h via RESPIRATORY_TRACT
  Filled 2014-08-23: qty 20

## 2014-08-23 MED ORDER — IPRATROPIUM BROMIDE 0.02 % IN SOLN
0.5000 mg | Freq: Once | RESPIRATORY_TRACT | Status: AC
  Administered 2014-08-23: 0.5 mg via RESPIRATORY_TRACT
  Filled 2014-08-23: qty 2.5

## 2014-08-23 MED ORDER — ALBUTEROL SULFATE (2.5 MG/3ML) 0.083% IN NEBU: 2.5000 mg | INHALATION_SOLUTION | RESPIRATORY_TRACT | Status: DC | PRN

## 2014-08-23 MED ORDER — SODIUM CHLORIDE 0.9 % IV SOLN: 250.0000 mL | INTRAVENOUS | Status: DC | PRN

## 2014-08-23 MED ORDER — FUROSEMIDE 10 MG/ML IJ SOLN
40.0000 mg | Freq: Once | INTRAMUSCULAR | Status: AC
  Administered 2014-08-23: 40 mg via INTRAVENOUS
  Filled 2014-08-23: qty 4

## 2014-08-23 MED ORDER — SALINE SPRAY 0.65 % NA SOLN
1.0000 | Freq: Four times a day (QID) | NASAL | Status: DC
  Administered 2014-08-23 – 2014-08-25 (×9): 1 via NASAL
  Filled 2014-08-23: qty 44

## 2014-08-23 NOTE — Progress Notes (Signed)
Carly rivet notified that patients blood sugar ws 509.  Instructed by md to give 4 additional units of insulin. Patient received a total of 10 units of novolog.

## 2014-08-23 NOTE — H&P (Signed)
Date: 08/23/2014               Patient Name:  [name] MRN: [id number]  DOB: 25-Jun-1955 Age / Sex: 59 y.o., female   PCP: [name], NP         Medical Service: Internal Medicine Teaching Service         Attending Physician: Dr. Levert Feinstein, MD    First Contact: Dr. Rich Number Pager: [phone]  Second Contact: Dr. Boykin Peek  Pager: [phone]       After Hours (After 5p/  First Contact Pager: [phone]  weekends / holidays): Second Contact Pager: [phone]   Chief Complaint: Shortness of breath   History of Present Illness: [name] is a 59yo woman with PMHx of COPD on 4 L home oxygen, symptomatic bradycardia secondary to complete heart block s/p pacemaker insertion in 2014, CAD (NSTEMI with cath in 2006, RCA spasm noted), CHF (EF 45% in 2006 but now improved to 50-55%), AFib not on anticoagulation, HTN, and type 2 DM who presents to the ED with shortness of breath. Patient reports her SOB started 3 days ago and has continued to worsen. She states she "could not breathe" last night and had to turn her oxygen up to 5 L. She notes associated sore throat and nasal congestion, but denies fevers, chills, and productive cough.  She saw her PCP and was started on Cefdinir 300 mg BID for which she has taken the past 2 days without any relief of her symptoms. She also reports diarrhea that started today with 5 loose bowel movements that she describes as watery and soft. Denies any blood in the stool. She has had C diff in the past and was treated appropriately. She denies abdominal pain, nausea, and vomiting. Patient also notes chest pain that has been ongoing for several months. She describes the pain as left-sided, feels like "blood is surging", lasting only a few seconds, and occurring once every few days.   In the ED, she had a CXR which showed pulmonary vascular congestion. She was given albuterol nebs, atrovent nebs, lasix 40 mg, and solu-medrol 125 mg.   Meds: Current  Facility-Administered Medications  Medication Dose Route Frequency Provider Last Rate Last Dose  . albuterol (PROVENTIL,VENTOLIN) solution continuous neb  5 mg/hr Nebulization Once Rolan Bucco, MD   5 mg/hr at 08/23/14 1000  . furosemide (LASIX) injection 40 mg  40 mg Intravenous Once [employer], PA-C      . ipratropium (ATROVENT) 0.02 % nebulizer solution           . methylPREDNISolone sodium succinate (SOLU-MEDROL) 125 mg/2 mL injection 125 mg  125 mg Intravenous Once Renne Crigler, PA-C       Current Outpatient Prescriptions  Medication Sig Dispense Refill  . albuterol (PROVENTIL HFA;VENTOLIN HFA) 108 (90 BASE) MCG/ACT inhaler Inhale 2 puffs into the lungs every 6 (six) hours as needed for wheezing or shortness of breath.    Marland Kitchen aspirin EC 81 MG tablet Take 81 mg by mouth 2 (two) times daily.    Marland Kitchen atorvastatin (LIPITOR) 10 MG tablet Take 10 mg by mouth at bedtime.    . budesonide-formoterol (SYMBICORT) 160-4.5 MCG/ACT inhaler Inhale 2 puffs into the lungs 2 (two) times daily.    . cefdinir (OMNICEF) 300 MG capsule Take 300 mg by mouth 2 (two) times daily.    . digoxin (LANOXIN) 0.25 MG tablet Take 0.5 tablets (0.125 mg total) by mouth daily. Take 1/2 tablet daily 45  tablet 3  . diltiazem (TIAZAC) 300 MG 24 hr capsule Take 300 mg by mouth daily.     . Exenatide ER (BYDUREON) 2 MG PEN Inject 25 Units into the skin 2 (two) times daily.    . famotidine (PEPCID) 20 MG tablet Take 1 tablet (20 mg total) by mouth at bedtime. 30 tablet 1  . fluticasone (FLONASE) 50 MCG/ACT nasal spray Place into both nostrils daily.    . furosemide (LASIX) 40 MG tablet Take 40 mg by mouth 2 (two) times daily.     Marland Kitchen HYDROcodone-acetaminophen (NORCO) 10-325 MG per tablet Take 1 tablet by mouth every 4 (four) hours as needed for moderate pain.     Marland Kitchen insulin aspart (NOVOLOG) 100 UNIT/ML injection Inject 3-8 Units into the skin 3 (three) times daily before meals. 150-200=3 units      anything >300=8 units    . insulin  lispro (HUMALOG) 100 UNIT/ML injection Inject 30 Units into the skin 2 (two) times daily.    Marland Kitchen lisinopril (PRINIVIL,ZESTRIL) 20 MG tablet Take 20 mg by mouth daily.    . metFORMIN (GLUCOPHAGE) 1000 MG tablet Take 1,000 mg by mouth 2 (two) times daily with a meal.    . methocarbamol (ROBAXIN) 750 MG tablet Take 750 mg by mouth 4 (four) times daily as needed for muscle spasms.    Marland Kitchen rOPINIRole (REQUIP) 1 MG tablet Take 1 mg by mouth at bedtime.    . saccharomyces boulardii (FLORASTOR) 250 MG capsule Take 250 mg by mouth 2 (two) times daily.    . sertraline (ZOLOFT) 50 MG tablet Take 50 mg by mouth daily.    . traZODone (DESYREL) 50 MG tablet Take 100 mg by mouth at bedtime as needed for sleep.       Allergies: Allergies as of 08/23/2014 - Review Complete 08/23/2014  Allergen Reaction Noted  . Ambien [zolpidem tartrate] Other (See Comments) 09/30/2012  . Ciprofloxacin  10/13/2012  . Actos [pioglitazone] Swelling 11/07/2012  . Lunesta [eszopiclone] Other (See Comments) 10/09/2012  . Protonix [pantoprazole sodium] Other (See Comments) 10/09/2012   Past Medical History  Diagnosis Date  . COPD (chronic obstructive pulmonary disease)   . Chronic respiratory failure   . Morbid obesity   . Tobacco abuse   . GERD (gastroesophageal reflux disease)   . Hyperlipidemia   . Chronic pain   . Hypertension   . Coronary vasospasm     a. 03/2004 NSTEMI/Cath: EF 45%, LM nl, LAD nl, D1 nl, D2 nl, LCX nl, OM1 large-nl, RCA-spasm noted, otw nl.  . Arthritis   . Allergic rhinitis   . Diabetes mellitus type 2 in obese 02/29/2012  . HTN (hypertension) 02/29/2012  . 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 recurrent in setting of abx noncompliance.  . Chronic kidney disease 09/2012    acute renal failure  . Shock 09/2012  . H1N1 influenza    Past Surgical History  Procedure Laterality Date  . Cholecystectomy    . Partial hysterectomy    . Angioplasty    . Umbilical hernia repair  12/2009  . Permanent  pacemaker insertion N/A 10/16/2012    Procedure: PERMANENT PACEMAKER INSERTION;  Surgeon: Marinus Maw, MD;  Location: [facility] CATH LAB;  Service: Cardiovascular;  Laterality: N/A;   History reviewed. No pertinent family history. History   Social History  . Marital Status: Married    Spouse Name: N/A  . Number of Children: 1  . Years of Education: N/A   Occupational History  .  Environmental education officer   Social History Main Topics  . Smoking status: Former Smoker -- 2.00 packs/day for 40 years    Types: Cigarettes    Quit date: 03/04/2012  . Smokeless tobacco: Never Used  . Alcohol Use: No  . Drug Use: No  . Sexual Activity: Not on file   Other Topics Concern  . Not on file   Social History Narrative   Lives in [place] with her husband.  She does not routinely exercise.    Review of Systems: General: Denies night sweats, changes in weight, changes in appetite HEENT: Denies ear pain, changes in vision CV: Denies palpitations, orthopnea Pulm: See HPI GI: See HPI GU: Denies dysuria, hematuria, frequency Msk: Denies muscle cramps, joint pains Neuro: Denies weakness, numbness, tingling Skin: Denies rashes, bruising  Physical Exam: Blood pressure 156/98, pulse 95, temperature 98.4 F (36.9 C), temperature source Axillary, resp. rate 19, SpO2 98 %. General: elderly woman sitting up in bed, breathing comfortably on oxygen via [place], NAD HEENT: [place]/AT, EOMI, sclera anicteric, pharynx non-erythematous, mucus membranes moist CV: RRR, no m/g/r Pulm: CTA bilaterally, no wheezing appreciated. Breathing comfortably on 4 L oxygen via [place].  Abd: BS+, soft, obese, mild diffuse tenderness to palpation  Ext: warm, no edema. No evidence of DVT.  Neuro: alert and oriented x 3, no focal deficits   Lab results: Basic Metabolic Panel:  Recent Labs  16/10/96 1005  NA 134*  K 3.8  CL 87*  CO2 34*  GLUCOSE 223*  BUN 22*  CREATININE 0.95  CALCIUM 9.3   CBC:  Recent Labs   08/23/14 1005  WBC 9.2  NEUTROABS 7.3  HGB 11.3*  HCT 37.7  MCV 88.5  PLT 299    Imaging results:  Dg Chest Portable 1 View  08/23/2014   CLINICAL DATA:  Short of breath. Worsening symptoms over the last 4 days.  EXAM: PORTABLE CHEST - 1 VIEW  COMPARISON:  Multiple priors dating back 10/21/2012.  FINDINGS: Unchanged 2 lead LEFT subclavian pacemaker. The cardiopericardial silhouette is enlarged. The heart size appears larger than on prior chest radiographs accounting for differences in technique. Pulmonary vascular engorgement is present with widening of vascular pedicle and prominence of the pulmonary hila. No alveolar edema. No effusion.  IMPRESSION: Cardiomegaly and pulmonary vascular congestion.   Electronically Signed   By: Andreas Newport M.D.   On: 08/23/2014 10:44    Other results: EKG: Sinus tachycardia, RBBB, LAFB  Assessment & Plan by Problem:  Acute COPD Exacerbation: Patient presented with a 3 day hx of dyspnea, sore throat, and congestion in setting of COPD on oxygen therapy at home found to be in a COPD exacerbation. Likely she has viral infection that brought on the exacerbation. She does not have fever, leukocytosis, or productive cough to suggest a bacterial infection. She had already started Cefdinir from her PCP so will continue this antibiotic for an additional 3 days to complete a 5 day course.  - Switch to Cefuroxime (hospital formulary) 500 mg BID for 3 more days - Start Prednisone 60 mg daily, first dose tomorrow - Albuterol and Atrovent nebs Q4H, albuterol nebs Q2H PRN wheezing - Oxygen therapy- 4 L, can move up to 5 L if needed but will try not to - Saline nasal spray - Continue Symbicort BID - Continue Flonase   Diarrhea: Patient presented with 1 day hx of 5 loose/watery bowel movements. Patient has had C diff infection in the past that was treated  appropriately. Given her recent antibiotic use will check for C diff infection. - Check C diff toxin - Enteric  precautions  - Continue probiotic  Symptomatic bradycardia secondary to complete heart block s/p pacemaker insertion, CAD: Pacemaker placed in 2014.  Last checked in May 2016 with no malfunction. She had a cardiac cath in 2006 which showed patency of the LAD and other main vessels. There was noted to be vasospasm in the RCA that resolved with nitroglycerin. Patient had reported some chest pain on admission, but this pain was very atypical as it only lasts a few seconds, intermittent, not exacerbated with exertion, and has been ongoing for several months. Troponin negative x 1. EKG without significant changes compared to prior. Doubt ACS.  - Continue ASA 81 mg daily - Continue Atorvastatin 10 mg daily - Continue Lisinopril 20 mg daily  - Continue to monitor   AFib not on anticoagulation: Patient not felt to be a candidate for anticoagulation despite her stroke risk. CHADS-VASc score 4. She takes Diltiazem 300 mg daily and Digoxin 0.125 mg daily at home. Her cardiologist is Dr. Ladona Ridgel.  - Continue home Diltiazem and Digoxin   HTN: BP in 120s-150s systolic. Patient takes Diltiazem 300 mg daily, Lasix 40 mg BID, and Lisinopril 20 mg daily at home. - Continue home Diltiazem and Lisinopril - Hold Lasix for now as she does not appear fluid overloaded on exam. May need to restart Lasix if becomes volume up.   Type 2 DM: Last HbA1c in records from 2013 was 6.4. Patient takes Metformin 1000 mg BID, Novolog 3-8 units sliding scale with meals, and Humalog 30 units BID at home.  - Start resistant sliding scale since will be on steroid therapy - Start Novolog 6 units TID with meals, adjust as needed - CBGs 4 times daily   Chronic Back Pain: Patient takes Robaxin 750 mg 4 times daily PRN and Norco 10-325 mg Q4H PRN at home.  - Hold home Robaxin as is sedating - Change Norco 10-325 mg to Q6H to prevent withdrawal   Restless Legs Syndrome: Patient takes Requip 1 mg QHS at home. - Continue home Requip     Diet: Heart healthy/Carb modified  VTE PPx: Lovenox SQ Dispo: Disposition is deferred at this time, awaiting improvement of current medical problems. Anticipated discharge in approximately 1-2 day(s).   The patient does have a current PCP [name], NP) and does need an [facility] hospital follow-up appointment after discharge.  The patient does not have transportation limitations that hinder transportation to clinic appointments.  Signed: Su Hoff, MD 08/23/2014, 11:05 AM

## 2014-08-23 NOTE — ED Provider Notes (Signed)
CSN: [id number]     Arrival date & time 08/23/14  4098 History   First MD Initiated Contact with Patient 08/23/14 [phone]     Chief Complaint  Patient presents with  . Shortness of Breath     (Consider location/radiation/quality/duration/timing/severity/associated sxs/prior Treatment) HPI Comments: Patient with history of oxygen dependent COPD on 4 L nasal cannula at home, CAD, NSTEMI -- presents with worsening shortness of breath, increased oxygen requirement (5L [place]) over the past 1 week. Patient saw her primary care physician 4 days ago and was started on an antibiotic-coated which she has been taking. Despite this, symptoms have become worse with persistent cough and worsening shortness of breath. Patient has been using DuoNeb treatments at home more frequently than usual without relief. She denies fever, URI symptoms other than ear pain, chest pain, abdominal pain, diarrhea. The onset of this condition was acute. The course is constant. Aggravating factors: activity. Alleviating factors: none.   Patient has had hospitalizations in the past complicated by ARDS requiring intubation, respiratory failure, tracheitis, C. difficile colitis.   Patient is a 59 y.o. female presenting with shortness of breath. The history is provided by the patient, medical records and a relative.  Shortness of Breath Associated symptoms: cough and wheezing   Associated symptoms: no abdominal pain, no chest pain, no fever, no headaches, no rash, no sore throat and no vomiting     Past Medical History  Diagnosis Date  . COPD (chronic obstructive pulmonary disease)   . Chronic respiratory failure   . Morbid obesity   . Tobacco abuse   . GERD (gastroesophageal reflux disease)   . Hyperlipidemia   . Chronic pain   . Hypertension   . Coronary vasospasm     a. 03/2004 NSTEMI/Cath: EF 45%, LM nl, LAD nl, D1 nl, D2 nl, LCX nl, OM1 large-nl, RCA-spasm noted, otw nl.  . Arthritis   . Allergic rhinitis   . Diabetes  mellitus type 2 in obese 02/29/2012  . HTN (hypertension) 02/29/2012  . 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 recurrent in setting of abx noncompliance.  . Chronic kidney disease 09/2012    acute renal failure  . Shock 09/2012  . H1N1 influenza    Past Surgical History  Procedure Laterality Date  . Cholecystectomy    . Partial hysterectomy    . Angioplasty    . Umbilical hernia repair  12/2009  . Permanent pacemaker insertion N/A 10/16/2012    Procedure: PERMANENT PACEMAKER INSERTION;  Surgeon: Marinus Maw, MD;  Location: [facility] CATH LAB;  Service: Cardiovascular;  Laterality: N/A;   History reviewed. No pertinent family history. History  Substance Use Topics  . Smoking status: Former Smoker -- 2.00 packs/day for 40 years    Types: Cigarettes    Quit date: 03/04/2012  . Smokeless tobacco: Never Used  . Alcohol Use: No   OB History    No data available     Review of Systems  Constitutional: Negative for fever.  HENT: Negative for rhinorrhea and sore throat.   Eyes: Negative for redness.  Respiratory: Positive for cough, shortness of breath and wheezing.   Cardiovascular: Negative for chest pain.  Gastrointestinal: Negative for nausea, vomiting, abdominal pain and diarrhea.  Genitourinary: Negative for dysuria.  Musculoskeletal: Negative for myalgias.  Skin: Negative for rash.  Neurological: Negative for headaches.      Allergies  Ambien; Ciprofloxacin; Actos; Lunesta; and Protonix  Home Medications   Prior to Admission medications   Medication Sig  Start Date End Date Taking? Authorizing Provider  albuterol (PROVENTIL HFA;VENTOLIN HFA) 108 (90 BASE) MCG/ACT inhaler Inhale 2 puffs into the lungs every 6 (six) hours as needed for wheezing or shortness of breath.    Historical Provider, MD  amoxicillin (AMOXIL) 500 MG capsule Take 500 mg by mouth 3 (three) times daily.    Historical Provider, MD  aspirin EC 81 MG tablet Take 81 mg by mouth 2 (two) times daily.    Historical  Provider, MD  atorvastatin (LIPITOR) 10 MG tablet Take 10 mg by mouth at bedtime.    Historical Provider, MD  budesonide-formoterol (SYMBICORT) 160-4.5 MCG/ACT inhaler Inhale 2 puffs into the lungs 2 (two) times daily.    Historical Provider, MD  digoxin (LANOXIN) 0.25 MG tablet Take 0.5 tablets (0.125 mg total) by mouth daily. Take 1/2 tablet daily 10/08/13   Marinus Maw, MD  diltiazem [facility]) 300 MG 24 hr capsule Take 300 mg by mouth daily.     Historical Provider, MD  Exenatide (BYETTA 5 MCG PEN [place]) Inject 5 mLs into the skin 2 (two) times daily.    Historical Provider, MD  famotidine (PEPCID) 20 MG tablet Take 1 tablet (20 mg total) by mouth at bedtime. 09/16/12   Vassie Loll, MD  furosemide (LASIX) 40 MG tablet Take 40 mg by mouth 2 (two) times daily.  11/27/12   Historical Provider, MD  HYDROcodone-acetaminophen (NORCO) 10-325 MG per tablet Take 1 tablet by mouth every 4 (four) hours as needed for moderate pain.  10/06/12   Historical Provider, MD  insulin aspart (NOVOLOG) 100 UNIT/ML injection Inject 3-8 Units into the skin 3 (three) times daily before meals. 150-200=3 units      anything >300=8 units    Historical Provider, MD  insulin lispro (HUMALOG) 100 UNIT/ML injection Inject 30 Units into the skin 2 (two) times daily.    Historical Provider, MD  metFORMIN (GLUCOPHAGE) 1000 MG tablet Take 1,000 mg by mouth 2 (two) times daily with a meal.    Historical Provider, MD  methocarbamol (ROBAXIN) 750 MG tablet Take 750 mg by mouth 4 (four) times daily as needed for muscle spasms.    Historical Provider, MD  rOPINIRole (REQUIP) 1 MG tablet Take 1 mg by mouth at bedtime.    Historical Provider, MD  sertraline (ZOLOFT) 50 MG tablet Take 50 mg by mouth daily.    Historical Provider, MD  traZODone (DESYREL) 50 MG tablet Take 50-100 mg by mouth at bedtime as needed for sleep.    Historical Provider, MD   BP 156/98 mmHg  Pulse 95  Resp 19  SpO2 88% Physical Exam  Constitutional: She appears  well-developed and well-nourished.  HENT:  Head: Normocephalic and atraumatic.  Right Ear: External ear and ear canal normal. Tympanic membrane is scarred. Tympanic membrane is not erythematous.  Left Ear: External ear and ear canal normal. Tympanic membrane is scarred. Tympanic membrane is not erythematous.  Eyes: Conjunctivae are normal. Right eye exhibits no discharge. Left eye exhibits no discharge.  Neck: Normal range of motion. Neck supple.  Cardiovascular: Normal rate, regular rhythm and normal heart sounds.   Pulmonary/Chest: Effort normal. Tachypnea noted. No respiratory distress. She has no decreased breath sounds. She has wheezes (moderate, expiratory, scattered. ). She has no rhonchi. She has no rales.  Patient talking in full sentences but with increased effort.   Abdominal: Soft. There is no tenderness.  Neurological: She is alert.  Skin: Skin is warm and dry.  Psychiatric: She has  a normal mood and affect.  Nursing note and vitals reviewed.   ED Course  Procedures (including critical care time) Labs Review Labs Reviewed  CBC WITH DIFFERENTIAL/PLATELET - Abnormal; Notable for the following:    Hemoglobin 11.3 (*)    Neutrophils Relative % 80 (*)    All other components within normal limits  BASIC METABOLIC PANEL - Abnormal; Notable for the following:    Sodium 134 (*)    Chloride 87 (*)    CO2 34 (*)    Glucose, Bld 223 (*)    BUN 22 (*)    All other components within normal limits  GLUCOSE, CAPILLARY - Abnormal; Notable for the following:    Glucose-Capillary 179 (*)    All other components within normal limits  DIGOXIN LEVEL  BRAIN NATRIURETIC PEPTIDE  CLOSTRIDIUM DIFFICILE CULTURE-FECAL  I-STAT TROPOININ, ED    Imaging Review Dg Chest Portable 1 View  08/23/2014   CLINICAL DATA:  Short of breath. Worsening symptoms over the last 4 days.  EXAM: PORTABLE CHEST - 1 VIEW  COMPARISON:  Multiple priors dating back 10/21/2012.  FINDINGS: Unchanged 2 lead LEFT  subclavian pacemaker. The cardiopericardial silhouette is enlarged. The heart size appears larger than on prior chest radiographs accounting for differences in technique. Pulmonary vascular engorgement is present with widening of vascular pedicle and prominence of the pulmonary hila. No alveolar edema. No effusion.  IMPRESSION: Cardiomegaly and pulmonary vascular congestion.   Electronically Signed   By: Andreas Newport M.D.   On: 08/23/2014 10:44     EKG Interpretation   Date/Time:  Monday August 23 2014 09:52:03 EDT Ventricular Rate:  101 PR Interval:  63 QRS Duration: 180 QT Interval:  402 QTC Calculation: 521 R Axis:   -80 Text Interpretation:  Sinus tachycardia Atrial premature complex LAE,  consider biatrial enlargement RBBB and LAFB Left ventricular hypertrophy  Baseline wander in lead(s) I III aVR aVL aVF V1 V3 V4 V5 V6 SINCE LAST  TRACING HEART RATE HAS INCREASED Confirmed by BELFI  MD, MELANIE ([id number])  on 08/23/2014 9:56:51 AM       10:07 AM Patient seen and examined. Work-up initiated. Medications ordered.   Vital signs reviewed and are as follows: BP 156/98 mmHg  Pulse 95  Resp 19  SpO2 97%  10:55 AM Patient appears more comfortable with increased air movement. She is feeling somewhat better. O2 sat is normal on treatment. Dr. Fredderick Phenix has seen.   Given vascular congestion on x-ray ordered dose of lasix.   Will admit for increasing SOB, hypoxia, failure of outpatient treatment.   Teaching service to admit.    MDM   Final diagnoses:  COPD exacerbation   Admit.     Renne Crigler, PA-C 08/23/14 1609  Rolan Bucco, MD 08/25/14 1515

## 2014-08-23 NOTE — ED Notes (Signed)
Pt here from home with c/o sob , pt states that it her sob has been getting worse over the last 4 days

## 2014-08-24 DIAGNOSIS — G8929 Other chronic pain: Secondary | ICD-10-CM

## 2014-08-24 DIAGNOSIS — Z9981 Dependence on supplemental oxygen: Secondary | ICD-10-CM | POA: Diagnosis not present

## 2014-08-24 DIAGNOSIS — J441 Chronic obstructive pulmonary disease with (acute) exacerbation: Secondary | ICD-10-CM | POA: Diagnosis not present

## 2014-08-24 DIAGNOSIS — I10 Essential (primary) hypertension: Secondary | ICD-10-CM

## 2014-08-24 DIAGNOSIS — Z794 Long term (current) use of insulin: Secondary | ICD-10-CM

## 2014-08-24 DIAGNOSIS — I2510 Atherosclerotic heart disease of native coronary artery without angina pectoris: Secondary | ICD-10-CM

## 2014-08-24 DIAGNOSIS — Z8619 Personal history of other infectious and parasitic diseases: Secondary | ICD-10-CM | POA: Insufficient documentation

## 2014-08-24 DIAGNOSIS — A047 Enterocolitis due to Clostridium difficile: Secondary | ICD-10-CM

## 2014-08-24 DIAGNOSIS — E119 Type 2 diabetes mellitus without complications: Secondary | ICD-10-CM

## 2014-08-24 DIAGNOSIS — I4891 Unspecified atrial fibrillation: Secondary | ICD-10-CM

## 2014-08-24 DIAGNOSIS — I252 Old myocardial infarction: Secondary | ICD-10-CM

## 2014-08-24 DIAGNOSIS — Z950 Presence of cardiac pacemaker: Secondary | ICD-10-CM

## 2014-08-24 DIAGNOSIS — M549 Dorsalgia, unspecified: Secondary | ICD-10-CM

## 2014-08-24 DIAGNOSIS — Z87891 Personal history of nicotine dependence: Secondary | ICD-10-CM | POA: Diagnosis not present

## 2014-08-24 DIAGNOSIS — G2581 Restless legs syndrome: Secondary | ICD-10-CM

## 2014-08-24 LAB — BASIC METABOLIC PANEL
Anion gap: 10 (ref 5–15)
BUN: 31 mg/dL — ABNORMAL HIGH (ref 6–20)
CALCIUM: 8.6 mg/dL — AB (ref 8.9–10.3)
CO2: 36 mmol/L — AB (ref 22–32)
Chloride: 90 mmol/L — ABNORMAL LOW (ref 101–111)
Creatinine, Ser: 1.06 mg/dL — ABNORMAL HIGH (ref 0.44–1.00)
GFR calc Af Amer: >60 mL/min (ref >60)
GFR calc non Af Amer: 56 mL/min — ABNORMAL LOW (ref >60)
GLUCOSE: 341 mg/dL — AB (ref 65–99)
POTASSIUM: 4 mmol/L (ref 3.5–5.1)
Sodium: 136 mmol/L (ref 135–145)

## 2014-08-24 LAB — GLUCOSE, CAPILLARY
Glucose-Capillary: 308 mg/dL — ABNORMAL HIGH (ref 65–99)
Glucose-Capillary: 333 mg/dL — ABNORMAL HIGH (ref 65–99)
Glucose-Capillary: 404 mg/dL — ABNORMAL HIGH (ref 65–99)
Glucose-Capillary: 396 mg/dL — ABNORMAL HIGH (ref 65–99)

## 2014-08-24 MED ORDER — VANCOMYCIN 50 MG/ML ORAL SOLUTION: 125.0000 mg | Freq: Every day | ORAL | Status: DC

## 2014-08-24 MED ORDER — VANCOMYCIN 50 MG/ML ORAL SOLUTION: 125.0000 mg | ORAL | Status: DC

## 2014-08-24 MED ORDER — VANCOMYCIN 50 MG/ML ORAL SOLUTION: 125.0000 mg | Freq: Two times a day (BID) | ORAL | Status: DC

## 2014-08-24 MED ORDER — VANCOMYCIN 50 MG/ML ORAL SOLUTION
125.0000 mg | Freq: Four times a day (QID) | ORAL | Status: DC
  Filled 2014-08-24 (×4): qty 2.5

## 2014-08-24 MED ORDER — VANCOMYCIN 50 MG/ML ORAL SOLUTION
125.0000 mg | Freq: Four times a day (QID) | ORAL | Status: DC
  Administered 2014-08-24 – 2014-08-25 (×6): 125 mg via ORAL
  Filled 2014-08-24 (×8): qty 2.5

## 2014-08-24 MED ORDER — INSULIN ASPART 100 UNIT/ML ~~LOC~~ SOLN
8.0000 [IU] | Freq: Three times a day (TID) | SUBCUTANEOUS | Status: DC
  Administered 2014-08-24 – 2014-08-25 (×4): 8 [IU] via SUBCUTANEOUS

## 2014-08-24 MED ORDER — RIFAXIMIN 200 MG PO TABS: 400.0000 mg | ORAL_TABLET | Freq: Three times a day (TID) | ORAL | Status: DC

## 2014-08-24 MED ORDER — INSULIN GLARGINE 100 UNIT/ML ~~LOC~~ SOLN
15.0000 [IU] | Freq: Every day | SUBCUTANEOUS | Status: DC
  Administered 2014-08-24: 15 [IU] via SUBCUTANEOUS
  Filled 2014-08-24 (×2): qty 0.15

## 2014-08-24 NOTE — Progress Notes (Signed)
ANTIBIOTIC CONSULT NOTE - INITIAL  Pharmacy Consult:  PO Vancomycin Indication:   Recurrent C.diff  Allergies  Allergen Reactions  . Ambien [Zolpidem Tartrate] Other (See Comments)    Severe hallucinations and behavior changes reported by family  . Ciprofloxacin     PATIENT AT VERY HIGH RISK FOR C DIFFICILE COLITIS  . Actos [Pioglitazone] Swelling  . Lunesta [Eszopiclone] Other (See Comments)    Nightmares & hallucinations  . Protonix [Pantoprazole Sodium] Other (See Comments)    Unknown reaction-"Doctor told me to never take it"    Patient Measurements: Height:  (167.6 cm) Weight: 208 lb 15.9 oz (94.8 kg) IBW/kg (Calculated) : 59.3  Vital Signs: Temp: 98.7 F (37.1 C) (06/21 0758) Temp Source: Oral (06/21 0758) BP: 123/70 mmHg (06/21 0758) Pulse Rate: 71 (06/21 0758) Intake/Output from previous day: 06/20 0701 - 06/21 0700 In: 3 [I.V.:3] Out: 675 [Urine:675] Intake/Output from this shift: Total I/O In: 240 [P.O.:240] Out: 800 [Urine:800]  Labs:  Recent Labs  08/23/14 1005 08/24/14 0552  WBC 9.2  --   HGB 11.3*  --   PLT 299  --   CREATININE 0.95 1.06*   Estimated Creatinine Clearance: 65.5 mL/min (by C-G formula based on Cr of 1.06). No results for input(s): VANCOTROUGH, VANCOPEAK, VANCORANDOM, GENTTROUGH, GENTPEAK, GENTRANDOM, TOBRATROUGH, TOBRAPEAK, TOBRARND, AMIKACINPEAK, AMIKACINTROU, AMIKACIN in the last 72 hours.   Microbiology: Recent Results (from the past 720 hour(s))  Clostridium Difficile by PCR (not at [facility])     Status: Abnormal   Collection Time: 08/23/14  6:00 PM  Result Value Ref Range Status   C difficile by pcr POSITIVE (A) NEGATIVE Final    Comment: CRITICAL RESULT CALLED TO, READ BACK BY AND VERIFIED WITHSol Blazing RN 2056 08/23/14 A BROWNING     Medical History: Past Medical History  Diagnosis Date  . COPD (chronic obstructive pulmonary disease)   . Chronic respiratory failure   . Morbid obesity   . Tobacco abuse   . GERD  (gastroesophageal reflux disease)   . Hyperlipidemia   . Chronic pain   . Hypertension   . Coronary vasospasm     a. 03/2004 NSTEMI/Cath: EF 45%, LM nl, LAD nl, D1 nl, D2 nl, LCX nl, OM1 large-nl, RCA-spasm noted, otw nl.  . Arthritis   . Allergic rhinitis   . Diabetes mellitus type 2 in obese 02/29/2012  . HTN (hypertension) 02/29/2012  . 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 recurrent in setting of abx noncompliance.  . Chronic kidney disease 09/2012    acute renal failure  . Shock 09/2012  . H1N1 influenza      Assessment: 51 YOF with history of previous C.diff infections and current antibiotic use presented with diarrhea.  Pharmacy is consulted to initiate PO vancomycin for recurrent C.diff (previously treated in January and July of 2014).  Noted patient has been on the vancomycin taper regimen in the past.  Fidaxomicin would be an appropriate option for patient; however, cost is a concern.  Patient is negative for hypotension, WBC is WNL, and his SCr is mildly elevated.   Goal of Therapy:  Resolution of infection   Plan:  - Per discussion with MD, change to vanc  PO QID x 14 days, followed by rifaximin  PO TID x 14 more day. - Consider resuming home meds for better glycemic control. - Pharmacy will sign off.  Thank you for the consult!    Yvana Samonte D. Laney Potash, PharmD, BCPS Pager:  [phone]  08/24/2014, 9:55 AM

## 2014-08-24 NOTE — Progress Notes (Signed)
Subjective: Patient states her breathing is much improved. She denies any wheezing. She reports the tessalon pearls helped her cough. She notes she had diarrhea overnight and this morning. She had complained about not receiving her home Robaxin but I explained that we were concerned about her respiratory status and that medication can be sedating which is why we did not continue it. We also explained positive C diff results.   Objective: Vital signs in last 24 hours: Filed Vitals:   08/23/14 2037 08/24/14 0410 08/24/14 0612 08/24/14 0758  BP:   126/40 123/70  Pulse:  88 75 71  Temp:   98.3 F (36.8 C) 98.7 F (37.1 C)  TempSrc:   Oral Oral  Resp:  Height:      Weight:   208 lb 15.9 oz (94.8 kg)   SpO2: 92% 93% 93% 93%   Weight change:   Intake/Output Summary (Last 24 hours) at 08/24/14 0954 Last data filed at 08/24/14 0737  Gross per 24 hour  Intake    243 ml  Output   1475 ml  Net  -1232 ml   Physical Exam General: sitting up in bed, NAD HEENT: [place]/AT, EOMI, sclera anicteric, mucus membranes moist CV: RRR, no m/g/r Pulm: CTA bilaterally, breaths non-labored on 4 L oxygen via [place] Abd: BS+, soft, non-tender, non-distended Ext: warm, no edema  Neuro: alert and oriented x 3, no focal deficits   Lab Results: Basic Metabolic Panel:  Recent Labs Lab 08/23/14 1005 08/24/14 0552  NA 134* 136  K 3.8 4.0  CL 87* 90*  CO2 34* 36*  GLUCOSE 223* 341*  BUN 22* 31*  CREATININE 0.95 1.06*  CALCIUM 9.3 8.6*   CBC:  Recent Labs Lab 08/23/14 1005  WBC 9.2  NEUTROABS 7.3  HGB 11.3*  HCT 37.7  MCV 88.5  PLT 299   CBG:  Recent Labs Lab 08/23/14 1240 08/23/14 1743 08/23/14 2132 08/24/14 0708  GLUCAP 179* 509* 398* 308*    Micro Results: Recent Results (from the past 240 hour(s))  Clostridium Difficile by PCR (not at [facility])     Status: Abnormal   Collection Time: 08/23/14  6:00 PM  Result Value Ref Range Status   C difficile by pcr POSITIVE (A)  NEGATIVE Final    Comment: CRITICAL RESULT CALLED TO, READ BACK BY AND VERIFIED WITHSol Blazing RN 2056 08/23/14 A BROWNING    Studies/Results: Dg Chest Portable 1 View  08/23/2014   CLINICAL DATA:  Short of breath. Worsening symptoms over the last 4 days.  EXAM: PORTABLE CHEST - 1 VIEW  COMPARISON:  Multiple priors dating back 10/21/2012.  FINDINGS: Unchanged 2 lead LEFT subclavian pacemaker. The cardiopericardial silhouette is enlarged. The heart size appears larger than on prior chest radiographs accounting for differences in technique. Pulmonary vascular engorgement is present with widening of vascular pedicle and prominence of the pulmonary hila. No alveolar edema. No effusion.  IMPRESSION: Cardiomegaly and pulmonary vascular congestion.   Electronically Signed   By: Andreas Newport M.D.   On: 08/23/2014 10:44   Medications: I have reviewed the patient's current medications. Scheduled Meds: . aspirin EC  81 mg Oral BID  . atorvastatin  10 mg Oral QHS  . budesonide-formoterol  2 puff Inhalation BID  . cefUROXime  500 mg Oral BID  . digoxin  0.125 mg Oral Daily  . diltiazem  300 mg Oral Daily  . enoxaparin (LOVENOX) injection  40 mg Subcutaneous Q24H  . fluticasone  2 spray Each Nare Daily  . insulin aspart  0-20 Units Subcutaneous TID WC  . insulin aspart  0-5 Units Subcutaneous QHS  . insulin aspart  6 Units Subcutaneous TID WC  . ipratropium-albuterol  3 mL Nebulization Q4H  . lisinopril  20 mg Oral Daily  . predniSONE  60 mg Oral Q breakfast  . rOPINIRole  1 mg Oral QHS  . saccharomyces boulardii  250 mg Oral BID  . sertraline  50 mg Oral Daily  . sodium chloride  1 spray Each Nare QID  . sodium chloride  3 mL Intravenous Q12H   Continuous Infusions:  PRN Meds:.sodium chloride, albuterol, benzonatate, HYDROcodone-acetaminophen, sodium chloride, traZODone Assessment/Plan:  Acute COPD Exacerbation: Breathing much improved this morning. She appears comfortable on her home amount  of oxygen (4L). Will continue antibiotics and prednisone for total of 5 days.  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 This will be the patient's 3rd episode of C diff. She is reporting frequent diarrhea overnight and this morning as well. WBC count normal at 9.2. Discussed with pharmacy and will treat with PO Vanc for 14 days followed by Rifaximin 400 mg TID for 14 days.  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 Last checked in May 2016 with no malfunction. She had a cardiac cath in 2006 which showed patency of the LAD and other main vessels. There was noted to be vasospasm in the RCA that resolved with nitroglycerin.  - Continue ASA 81 mg daily - Continue Atorvastatin 10 mg daily - Continue Lisinopril 20 mg daily  - Continue to monitor   AFib not on anticoagulation: Patient not felt to be a candidate for anticoagulation despite her stroke risk. CHADS-VASc score 4. She takes Diltiazem 300 mg daily and Digoxin 0.125 mg daily at home. Her cardiologist is Dr. Ladona Ridgel.  - Continue home Diltiazem and Digoxin   HTN: BP in 120s-150s systolic. Patient takes Diltiazem 300 mg daily, Lasix 40 mg BID, and Lisinopril 20 mg daily at home. - Continue home Diltiazem and Lisinopril - Hold Lasix for now as she does not appear fluid overloaded on exam. May need to restart Lasix if becomes volume up.   Type 2 DM: Last HbA1c in records from 2013 was 6.4. Patient takes Metformin 1000 mg BID, Novolog 3-8 units sliding scale with meals, and Humalog 30 units BID at home. CBGs still elevated in 300s-500s. Will add long acting  coverage.  - Start Lantus 15 units QHS - Continue resistant sliding scale since will be on steroid therapy - Increase Novolog to 8 units TID with meals - CBGs 4 times daily   Chronic Back Pain: Patient takes Robaxin 750 mg 4 times daily PRN and Norco 10-325 mg Q4H PRN at home.  - Hold home Robaxin as is sedating - Continue Norco 10-325 mg to Q6H to prevent withdrawal   Restless Legs Syndrome: Patient takes Requip 1 mg QHS at home. - Continue home Requip   Diet: Heart healthy VTE PPx: Lovenox SQ Dispo: Disposition is deferred at this time, awaiting improvement of current medical problems.  Anticipated discharge  in approximately 1-2 day(s).   The patient does have a current PCP [name], NP) and does need an [facility] hospital follow-up appointment after discharge.  The patient does not have transportation limitations that hinder transportation to clinic appointments.  .Services Needed at time of discharge: Y = Yes, Blank = No PT:   OT:   RN:   Equipment:   Other:     LOS: 1 day   Su Hoff, MD 08/24/2014, 9:54 AM

## 2014-08-24 NOTE — Progress Notes (Signed)
UR COMPLETED  

## 2014-08-24 NOTE — Progress Notes (Signed)
Results for LAKENDRA, INZER (MRN [id number]) as of 08/24/2014 09:42  Ref. Range 08/23/2014 12:40 08/23/2014 17:43 08/23/2014 21:32 08/24/2014 07:08  Glucose-Capillary Latest Ref Range: 65-99 mg/dL 825 (H) 003 (H) 704 (H) 308 (H)  CBGs continue to be elevated. Recommend checking HgbA1C for home blood glucose control over the last 2-3 months. If CBGs continue to be greater than 180 mg/dl, may need to add low dose Lantus 15-18 units daily along with Novolog correction scale and meal coverage. Will continue to follow while in hospital. Smith Mince RN BSN CDE

## 2014-08-25 ENCOUNTER — Encounter (HOSPITAL_COMMUNITY): Payer: Self-pay | Admitting: General Practice

## 2014-08-25 DIAGNOSIS — Z9981 Dependence on supplemental oxygen: Secondary | ICD-10-CM | POA: Diagnosis not present

## 2014-08-25 DIAGNOSIS — J441 Chronic obstructive pulmonary disease with (acute) exacerbation: Secondary | ICD-10-CM | POA: Diagnosis not present

## 2014-08-25 DIAGNOSIS — A047 Enterocolitis due to Clostridium difficile: Secondary | ICD-10-CM | POA: Diagnosis not present

## 2014-08-25 LAB — BASIC METABOLIC PANEL
Anion gap: 8 (ref 5–15)
BUN: 26 mg/dL — ABNORMAL HIGH (ref 6–20)
CO2: 37 mmol/L — ABNORMAL HIGH (ref 22–32)
Calcium: 8.9 mg/dL (ref 8.9–10.3)
Chloride: 93 mmol/L — ABNORMAL LOW (ref 101–111)
Creatinine, Ser: 1 mg/dL (ref 0.44–1.00)
GFR, EST NON AFRICAN AMERICAN: 60 mL/min — AB (ref >60)
Glucose, Bld: 274 mg/dL — ABNORMAL HIGH (ref 65–99)
Potassium: 4.7 mmol/L (ref 3.5–5.1)
SODIUM: 138 mmol/L (ref 135–145)

## 2014-08-25 LAB — HEMOGLOBIN A1C
Hgb A1c MFr Bld: 6.9 % — ABNORMAL HIGH (ref 4.8–5.6)
Mean Plasma Glucose: 151 mg/dL

## 2014-08-25 LAB — GLUCOSE, CAPILLARY
GLUCOSE-CAPILLARY: 288 mg/dL — AB (ref 65–99)
Glucose-Capillary: 248 mg/dL — ABNORMAL HIGH (ref 65–99)

## 2014-08-25 MED ORDER — PREDNISONE 10 MG PO TABS
60.0000 mg | ORAL_TABLET | Freq: Every day | ORAL | Status: DC
  Administered 2014-08-25: 60 mg via ORAL
  Filled 2014-08-25 (×2): qty 1

## 2014-08-25 MED ORDER — VANCOMYCIN 50 MG/ML ORAL SOLUTION: 125.0000 mg | Freq: Four times a day (QID) | ORAL | Status: DC

## 2014-08-25 MED ORDER — PREDNISONE 20 MG PO TABS: 60.0000 mg | ORAL_TABLET | Freq: Every day | ORAL | Status: DC

## 2014-08-25 MED ORDER — BENZONATATE 200 MG PO CAPS: 200.0000 mg | ORAL_CAPSULE | Freq: Three times a day (TID) | ORAL | Status: DC | PRN

## 2014-08-25 NOTE — Progress Notes (Signed)
Talked to patient about home 02; patient already have home oxygen through [place] and her private physician is working on getting her a portable 02 tank ( filled out paperwork). Abelino Derrick RN,BSN,MHA [phone]

## 2014-08-25 NOTE — Progress Notes (Signed)
Patient ID: [name], female   DOB: 07-07-1955, 59 y.o.   MRN: [id number] Medicine attending discharge note: I personally examined this patient on the day of discharge together with resident physician Dr. Rich Number and I concur with her evaluation and discharge plan.  Clinical summary: 59 year old woman with tobacco-related advanced obstructive airway disease, oxygen dependent for the last 4 years since a severe episode of H1 N1. In addition, she has coronary artery disease status post non-ST wave elevation MI in 2006. She required a permanent pacemaker in 2014 for complete heart block. She has a history of atrial fibrillation not currently on anticoagulation(current EKG shows sinus rhythm). She has Hypertension. Type 2 diabetes. Currently insulin-dependent. She presented to her family physician 2 days prior to the current admission with increasing dyspnea, sore throat, nasal congestion, but no significant cough. She was started on oral antibiotics. Symptoms progressed and she presented to the emergency department for further evaluation. In addition to her respiratory symptoms she reported watery diarrhea starting around the day of admission without any hematochezia, abdominal pain, nausea, or vomiting.. She has a history of C. difficile colitis in the past.  Hospital course: She was treated with oxygen, broncho-dilators, steroids, and antibiotics for her COPD exacerbation with rapid improvement. Chest radiograph showed no infiltrate or effusion. Borderline cardiomegaly. EKG showed sinus tachycardia with right bundle branch block, left anterior fascicular block, occasional premature atrial contractions. Troponin undetectable.  Stool was tested and was C. difficile positive. A planned 14 day course of vancomycin was initiated. Initial plan was to add Retromax and for an additional 14 days. Unofficial consultation with infectious disease felt that since her previous episode of C. difficile was  over 2 years ago that vancomycin alone would be sufficient.  Disposition: Condition stable at time of discharge. She has a primary care physician who she will follow-up with. There were no complications.

## 2014-08-25 NOTE — Progress Notes (Signed)
Subjective: No acute events overnight. Patient states she still feels congested, but her breathing has overall improved. She notes her diarrhea is improving as well.   Objective: Vital signs in last 24 hours: Filed Vitals:   08/24/14 2049 08/24/14 2136 08/25/14 0533 08/25/14 0838  BP:  110/51 132/45   Pulse:  60 70   Temp:  97.9 F (36.6 C) 97.8 F (36.6 C)   TempSrc:  Oral Oral   Resp:  18 17   Height:      Weight:   215 lb 4.8 oz (97.659 kg)   SpO2: 96% 96%  96%   Weight change: 3 lb 6.4 oz (1.542 kg)  Intake/Output Summary (Last 24 hours) at 08/25/14 0850 Last data filed at 08/25/14 0600  Gross per 24 hour  Intake    750 ml  Output   2800 ml  Net  -2050 ml   Physical Exam General: sitting up in bed eating breakfast, NAD HEENT: [place]/AT, EOMI, sclera anicteric, mucus membranes moist CV: RRR, no m/g/r Pulm: Minimal wheezing on exam. Breaths non-labored on 4 L oxygen via [place].  Abd: BS+, soft, non-tender Ext: warm, no edema Neuro: alert and oriented x 3, no focal deficits  Lab Results: Basic Metabolic Panel:  Recent Labs Lab 08/24/14 0552 08/25/14 0444  NA 136 138  K 4.0 4.7  CL 90* 93*  CO2 36* 37*  GLUCOSE 341* 274*  BUN 31* 26*  CREATININE 1.06* 1.00  CALCIUM 8.6* 8.9   CBC:  Recent Labs Lab 08/23/14 1005  WBC 9.2  NEUTROABS 7.3  HGB 11.3*  HCT 37.7  MCV 88.5  PLT 299   CBG:  Recent Labs Lab 08/23/14 2132 08/24/14 0708 08/24/14 1148 08/24/14 1640 08/24/14 2134 08/25/14 0608  GLUCAP 398* 308* 333* 404* 396* 288*   Hemoglobin A1C:  Recent Labs Lab 08/23/14 1005  HGBA1C 6.9*   Micro Results: Recent Results (from the past 240 hour(s))  Clostridium Difficile by PCR (not at [facility])     Status: Abnormal   Collection Time: 08/23/14  6:00 PM  Result Value Ref Range Status   C difficile by pcr POSITIVE (A) NEGATIVE Final    Comment: CRITICAL RESULT CALLED TO, READ BACK BY AND VERIFIED WITHSol Blazing RN 2056 08/23/14 A BROWNING     Studies/Results: Dg Chest Portable 1 View  08/23/2014   CLINICAL DATA:  Short of breath. Worsening symptoms over the last 4 days.  EXAM: PORTABLE CHEST - 1 VIEW  COMPARISON:  Multiple priors dating back 10/21/2012.  FINDINGS: Unchanged 2 lead LEFT subclavian pacemaker. The cardiopericardial silhouette is enlarged. The heart size appears larger than on prior chest radiographs accounting for differences in technique. Pulmonary vascular engorgement is present with widening of vascular pedicle and prominence of the pulmonary hila. No alveolar edema. No effusion.  IMPRESSION: Cardiomegaly and pulmonary vascular congestion.   Electronically Signed   By: Andreas Newport M.D.   On: 08/23/2014 10:44   Medications: I have reviewed the patient's current medications. Scheduled Meds: . aspirin EC  81 mg Oral BID  . atorvastatin  10 mg Oral QHS  . budesonide-formoterol  2 puff Inhalation BID  . cefUROXime  500 mg Oral BID  . digoxin  0.125 mg Oral Daily  . diltiazem  300 mg Oral Daily  . enoxaparin (LOVENOX) injection  40 mg Subcutaneous Q24H  . fluticasone  2 spray Each Nare Daily  . insulin aspart  0-20 Units Subcutaneous TID WC  . insulin aspart  0-5 Units Subcutaneous QHS  . insulin aspart  8 Units Subcutaneous TID WC  . insulin glargine  15 Units Subcutaneous QHS  . ipratropium-albuterol  3 mL Nebulization Q4H  . lisinopril  20 mg Oral Daily  . predniSONE  60 mg Oral Q breakfast  . vancomycin  125 mg Oral QID   Followed by  . [START ON 09/07/2014] rifaximin  400 mg Oral TID  . rOPINIRole  1 mg Oral QHS  . saccharomyces boulardii  250 mg Oral BID  . sertraline  50 mg Oral Daily  . sodium chloride  1 spray Each Nare QID  . sodium chloride  3 mL Intravenous Q12H   Continuous Infusions:  PRN Meds:.sodium chloride, albuterol, HYDROcodone-acetaminophen, sodium chloride, traZODone Assessment/Plan:  Acute COPD Exacerbation: Breathing has improved significantly. Minimal wheezing on exam. Will  discharge patient on prednisone 60 mg for additional 2 days and Ceftin 500 mg BID for additional 2 days to complete a 5 day course.  - Discharge home - Continue Cefuroxime 500 mg BID  - Continue Prednisone 60 mg daily - Albuterol and Atrovent nebs Q4H, albuterol nebs Q2H PRN wheezing - Oxygen therapy- 4 L - Saline nasal spray - Continue Symbicort BID - Continue Flonase  - Portable oxygen tank ordered  C Diff Infection: Last episode was 2 years ago. Since this is greater than 8 weeks it does not count as a recurrent infection. Her diarrhea is already improving. Will treat patient with PO Vancomycin for 14 days total.  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 Last checked in May 2016 with no malfunction. She had a cardiac cath in 2006 which showed patency of the LAD and other main vessels. There was noted to be vasospasm in the RCA that resolved with nitroglycerin.  - Continue ASA 81 mg daily - Continue Atorvastatin 10 mg daily - Continue Lisinopril 20 mg daily  - Continue to monitor   AFib not on anticoagulation: Patient not felt to be a candidate for anticoagulation despite her stroke risk. CHADS-VASc score 4. She takes Diltiazem 300 mg daily and Digoxin 0.125 mg daily at home. Her cardiologist is Dr. Ladona Ridgel.  - Continue home Diltiazem and Digoxin   HTN: BP in 120s-150s systolic. Patient takes Diltiazem 300 mg daily, Lasix 40 mg BID, and Lisinopril 20 mg daily at home. - Continue home Diltiazem and Lisinopril - Hold Lasix for now as she does not appear fluid overloaded on exam. May need to restart Lasix if becomes volume up.   Type 2 DM: Last HbA1c in records from 2013 was 6.4. Patient takes Metformin 1000 mg BID, Novolog 3-8 units sliding scale with meals, and Humalog 30 units BID at home. CBGs still elevated in 300s-400s. Patient  received first dose of Lantus last night, hopefully will bring down blood sugars overall.  - Continue Lantus 15 units QHS. Patient would benefit from long acting insulin as an outpatient. Unclear why she is on both Humalog and Novolog.  - Continue resistant sliding scale since will be on steroid therapy - Continue Novolog 8 units TID with meals, may need to increase. Will adjust based on lunch time values. - CBGs 4 times daily   Chronic Back Pain: Patient takes Robaxin 750 mg 4 times daily PRN and Norco 10-325 mg Q4H PRN at home.  - Hold home Robaxin as is sedating - Continue  Norco 10-325 mg to Q6H to prevent withdrawal   Restless Legs Syndrome: Patient takes Requip 1 mg QHS at home. - Continue home Requip    Diet: Heart healthy VTE PPx: Lovenox SQ Dispo: Discharge likely today  The patient does have a current PCP [name], NP) and does need an [facility] hospital follow-up appointment after discharge.  The patient does not have transportation limitations that hinder transportation to clinic appointments.  .Services Needed at time of discharge: Y = Yes, Blank = No PT:   OT:   RN:   Equipment:   Other:     LOS: 2 days   Su Hoff, MD 08/25/2014, 8:50 AM

## 2014-08-25 NOTE — Discharge Summary (Signed)
Name: [name] MRN: [id number] DOB: [birth year] 59 y.o. 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     aspirin EC 81 MG tablet  Take 81 mg by mouth 2 (two) times daily.     atorvastatin 10 MG tablet  Commonly known as:  LIPITOR  Take 10 mg by mouth at bedtime.     benzonatate 200 MG capsule  Commonly known as:  TESSALON  Take 1 capsule (200 mg total) by mouth 3 (three) times daily as needed for cough.     budesonide-formoterol 160-4.5 MCG/ACT inhaler  Commonly known as:  SYMBICORT  Inhale 2 puffs into the lungs 2 (two) times daily.     BYDUREON 2 MG Pen  Generic drug:  Exenatide ER  Inject 25 Units into the skin 2 (two) times daily.     cefdinir 300 MG capsule  Commonly known as:  OMNICEF  Take 300 mg by mouth 2 (two) times daily.     digoxin 0.25 MG tablet  Commonly known as:  LANOXIN  Take 0.5 tablets (0.125 mg total) by mouth daily. Take 1/2 tablet daily     diltiazem 300 MG 24 hr capsule  Commonly known as:  TIAZAC  Take 300 mg by mouth daily.     famotidine 20 MG tablet  Commonly known as:  PEPCID  Take 1 tablet (20 mg total) by mouth at bedtime.     fluticasone 50 MCG/ACT nasal spray  Commonly known as:  FLONASE  Place into both nostrils daily.     furosemide 40 MG tablet  Commonly known as:  LASIX  Take 40 mg by mouth 2 (two) times  daily.     HYDROcodone-acetaminophen 10-325 MG per tablet  Commonly known as:  NORCO  Take 1 tablet by mouth every 4 (four) hours as needed for moderate pain.     insulin aspart 100 UNIT/ML injection  Commonly known as:  novoLOG  Inject 3-8 Units into the skin 3 (three) times daily before meals. 150-200=3 units      anything >300=8 units     insulin lispro 100 UNIT/ML injection  Commonly known as:  HUMALOG  Inject 30 Units into the skin 2 (two) times daily.     lisinopril 20 MG tablet  Commonly known as:  PRINIVIL,ZESTRIL  Take 20 mg by mouth daily.     metFORMIN 1000 MG tablet  Commonly known as:  GLUCOPHAGE  Take 1,000 mg by mouth 2 (two) times daily with a meal.     methocarbamol 750 MG tablet  Commonly known as:  ROBAXIN  Take 750 mg by mouth 4 (four) times daily as needed for muscle spasms.     predniSONE 20 MG tablet  Commonly known as:  DELTASONE  Take 3 tablets (60 mg total) by mouth daily with breakfast.  Start taking on:  08/26/2014  rOPINIRole 1 MG tablet  Commonly known as:  REQUIP  Take 1 mg by mouth at bedtime.     saccharomyces boulardii 250 MG capsule  Commonly known as:  FLORASTOR  Take 250 mg by mouth 2 (two) times daily.     sertraline 50 MG tablet  Commonly known as:  ZOLOFT  Take 50 mg by mouth daily.     traZODone 50 MG tablet  Commonly known as:  DESYREL  Take 100 mg by mouth at bedtime as needed for sleep.     vancomycin 50 mg/mL oral solution  Commonly known as:  VANCOCIN  Take 2.5 mLs (125 mg total) by mouth 4 (four) times daily.        Disposition and follow-up:   [name] was discharged from [facility] in Good condition.  At the hospital follow up visit please address:  1. COPD- Assess patient's respiratory status. Did she complete the Cefdinir and Prednisone therapy?  C Diff infection- Patient will need to take PO Vancomycin 125 mg 4 times daily through July 4th (end date). Check that patient has  been compliant with this medication and that her diarrhea has improved.  Type 2 DM- Patient needs better understanding of which insulins she is taking.   2.  Labs / imaging needed at time of follow-up: None  3.  Pending labs/ test needing follow-up: None   Follow-up Appointments:     Follow-up Information    Follow up with [facility], NP.   Specialty:  Nurse Practitioner   Why:  Please follow up with your PCP in the next 2 weeks   Contact information:   [address] [place] [address] [phone]       Discharge Instructions: Discharge Instructions    Diet - low sodium heart healthy    Complete by:  As directed      Increase activity slowly    Complete by:  As directed            Consultations:    Procedures Performed:  Dg Chest Portable 1 View  08/23/2014   CLINICAL DATA:  Short of breath. Worsening symptoms over the last 4 days.  EXAM: PORTABLE CHEST - 1 VIEW  COMPARISON:  Multiple priors dating back 10/21/2012.  FINDINGS: Unchanged 2 lead LEFT subclavian pacemaker. The cardiopericardial silhouette is enlarged. The heart size appears larger than on prior chest radiographs accounting for differences in technique. Pulmonary vascular engorgement is present with widening of vascular pedicle and prominence of the pulmonary hila. No alveolar edema. No effusion.  IMPRESSION: Cardiomegaly and pulmonary vascular congestion.   Electronically Signed   By: Andreas Newport M.D.   On: 08/23/2014 10:44   Admission HPI: Ms. Schirmer is a 59yo woman with PMHx of COPD on 4 L home oxygen, symptomatic bradycardia secondary to complete heart block s/p pacemaker insertion in 2014, CAD (NSTEMI with cath in 2006, RCA spasm noted), CHF (EF 45% in 2006 but now improved to 50-55%), AFib not on anticoagulation, HTN, and type 2 DM who presents to the ED with shortness of breath. Patient reports her SOB started 3 days ago and has continued to worsen. She states she "could not breathe" last  night and had to turn her oxygen up to 5 L. She notes associated sore throat and nasal congestion, but denies fevers, chills, and productive cough. She saw her PCP and was started on Cefdinir 300 mg BID for which she has taken the past 2 days without  any relief of her symptoms. She also reports diarrhea that started today with 5 loose bowel movements that she describes as watery and soft. Denies any blood in the stool. She has had C diff in the past and was treated appropriately. She denies abdominal pain, nausea, and vomiting. Patient also notes chest pain that has been ongoing for several months. She describes the pain as left-sided, feels like "blood is surging", lasting only a few seconds, and occurring once every few days.   In the ED, she had a CXR which showed pulmonary vascular congestion. She was given albuterol nebs, atrovent nebs, lasix 40 mg, and solu-medrol 125 mg.   Hospital Course by problem list:  Acute COPD Exacerbation: Patient presented with a 3 day hx of dyspnea, sore throat, and congestion in setting of COPD on oxygen therapy at home found to be in a COPD exacerbation. Likely she had a viral infection that brought on the exacerbation. She did not have fever, leukocytosis, or productive cough to suggest a bacterial infection. Since she had been started on Cefdinir as an outpatient, she was continued on Cefuroxime (hospital alternative) 500 mg BID. She was also started on Prednisone 60 mg daily for a total of 5 days as well as breathing treatments, symbicort, and flonase. By the following day, her wheezing had significantly improved. Upon discharge, she was recommended to take 2 additional days of her home Cefdinir to complete a 5 day course of antibiotics and to take 2 more days of prednisone. She will follow up with PCP in the next 1-2 weeks.   C Diff Infection: Patient had noted 5 episodes of watery diarrhea on the day of admission. She has a hx of C diff infection in 2014. She was  tested for C diff on PCR and noted to be positive. She was placed on enteric precautions and started on PO Vancomycin 125 mg 4 times daily. Since this occurrence was greater than 8 weeks apart from her last episode, it does not count as a recurrent infection. She will need to complete 14 days of PO Vancomycin 125 mg 4 times daily (end date: through July 4th). She was also recommended to take a probiotic.   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 Last checked in May 2016 with no malfunction. She had a cardiac cath in 2006 which showed patency of the LAD and other main vessels. There was noted to be vasospasm in the RCA that resolved with nitroglycerin. She had no anginal symptoms during her hospitalization. She was continued on her home ASA 81 mg daily, Atorvastatin 10 mg daily, and Lisinopril 20 mg daily.   AFib, not on AC: Patient not felt to be a candidate for anticoagulation despite her stroke risk. CHADS-VASc score 4. She was continued on her home Diltiazem 300 mg daily and Digoxin 0.125 mg daily at home.   HTN: BPs remained stable in the 120s-150s systolic. She was continued on her home Diltiazem 300 mg daily and Lisinopril 20 mg daily. Her home Lasix was held as she did not appear fluid overloaded on exam and she was having diarrhea. She was resumed on her home meds upon discharge.   Type 2 DM: HbA1c found to be 6.9. Patient states she takes Metformin 1000 mg BID, Novolog 3-8 units sliding scale with meals, and another long acting insulin that she does not know the name of at home. Her CBGs were elevated in the 300s-400s during hospitalization due  to concomitant steroid therapy for her COPD. Her blood sugars improved after Lantus 15 units was started. She was recommended to follow up with her PCP in the next 1-2 weeks to optimize her blood sugar control.   Chronic Back Pain: Her home Robaxin was held during hospitalization  given her respiratory status. Her Norco was decreased to 10-325 mg every 6 hours. Her home doses were resumed upon discharge.   Restless Legs Syndrome:  Her home Requip 1 mg QHS was continued.   Discharge Vitals:   BP 132/45 mmHg  Pulse 70  Temp(Src) 97.8 F (36.6 C) (Oral)  Resp 17  Ht 5\' 6"  (1.676 m)  Wt 215 lb 4.8 oz (97.659 kg)  BMI 34.77 kg/m2  SpO2 96% Physical Exam General: sitting up in bed eating breakfast, NAD HEENT: [place]/AT, EOMI, sclera anicteric, mucus membranes moist CV: RRR, no m/g/r Pulm: Minimal wheezing on exam. Breaths non-labored on 4 L oxygen via .  Abd: BS+, soft, non-tender Ext: warm, no edema Neuro: alert and oriented x 3, no focal deficits  Discharge Labs:  Results for orders placed or performed during the hospital encounter of 08/23/14 (from the past 24 hour(s))  Glucose, capillary     Status: Abnormal   Collection Time: 08/24/14 11:48 AM  Result Value Ref Range   Glucose-Capillary 333 (H) 65 - 99 mg/dL   Comment 1 Document in Chart   Glucose, capillary     Status: Abnormal   Collection Time: 08/24/14  4:40 PM  Result Value Ref Range   Glucose-Capillary 404 (H) 65 - 99 mg/dL   Comment 1 Document in Chart   Glucose, capillary     Status: Abnormal   Collection Time: 08/24/14  9:34 PM  Result Value Ref Range   Glucose-Capillary 396 (H) 65 - 99 mg/dL  Basic metabolic panel     Status: Abnormal   Collection Time: 08/25/14  4:44 AM  Result Value Ref Range   Sodium 138 135 - 145 mmol/L   Potassium 4.7 3.5 - 5.1 mmol/L   Chloride 93 (L) 101 - 111 mmol/L   CO2 37 (H) 22 - 32 mmol/L   Glucose, Bld 274 (H) 65 - 99 mg/dL   BUN 26 (H) 6 - 20 mg/dL   Creatinine, Ser 6.19 0.44 - 1.00 mg/dL   Calcium 8.9 8.9 - 50.9 mg/dL   GFR calc non Af Amer 60 (L) >60 mL/min   GFR calc Af Amer >60 >60 mL/min   Anion gap 8 5 - 15  Glucose, capillary     Status: Abnormal   Collection Time: 08/25/14  6:08 AM  Result Value Ref Range   Glucose-Capillary 288 (H) 65 - 99  mg/dL   Comment 1 Notify RN     Signed: Su Hoff, MD 08/25/2014, 11:17 AM    Services Ordered on Discharge: None Equipment Ordered on Discharge: Portable oxygen

## 2014-08-25 NOTE — Progress Notes (Signed)
1600 discharge instructions and prescription given .Verbalized understanding.

## 2014-08-25 NOTE — Discharge Instructions (Signed)
It was a pleasure taking care of you, [name].  - Take your home Cefdinir 300 mg twice daily for the next 2 days - Take Prednisone 60 mg daily for the next 2 days as well - Continue your home albuterol nebulizers  - For your C diff infection, please take Vancomycin 125 mg 4 times daily (every 6 hours) for the next 13 days  Chronic Obstructive Pulmonary Disease Chronic obstructive pulmonary disease (COPD) is a common lung condition in which airflow from the lungs is limited. COPD is a general term that can be used to describe many different lung problems that limit airflow, including both chronic bronchitis and emphysema. If you have COPD, your lung function will probably never return to normal, but there are measures you can take to improve lung function and make yourself feel better.  CAUSES   Smoking (common).   Exposure to secondhand smoke.   Genetic problems.  Chronic inflammatory lung diseases or recurrent infections. SYMPTOMS   Shortness of breath, especially with physical activity.   Deep, persistent (chronic) cough with a large amount of thick mucus.   Wheezing.   Rapid breaths (tachypnea).   Gray or bluish discoloration (cyanosis) of the skin, especially in fingers, toes, or lips.   Fatigue.   Weight loss.   Frequent infections or episodes when breathing symptoms become much worse (exacerbations).   Chest tightness. DIAGNOSIS  Your health care provider will take a medical history and perform a physical examination to make the initial diagnosis. Additional tests for COPD may include:   Lung (pulmonary) function tests.  Chest X-ray.  CT scan.  Blood tests. TREATMENT  Treatment available to help you feel better when you have COPD includes:   Inhaler and nebulizer medicines. These help manage the symptoms of COPD and make your breathing more comfortable.  Supplemental oxygen. Supplemental oxygen is only helpful if you have a low oxygen level in  your blood.   Exercise and physical activity. These are beneficial for nearly all people with COPD. Some people may also benefit from a pulmonary rehabilitation program. HOME CARE INSTRUCTIONS   Take all medicines (inhaled or pills) as directed by your health care provider.  Avoid over-the-counter medicines or cough syrups that dry up your airway (such as antihistamines) and slow down the elimination of secretions unless instructed otherwise by your health care provider.   If you are a smoker, the most important thing that you can do is stop smoking. Continuing to smoke will cause further lung damage and breathing trouble. Ask your health care provider for help with quitting smoking. He or she can direct you to community resources or hospitals that provide support.  Avoid exposure to irritants such as smoke, chemicals, and fumes that aggravate your breathing.  Use oxygen therapy and pulmonary rehabilitation if directed by your health care provider. If you require home oxygen therapy, ask your health care provider whether you should purchase a pulse oximeter to measure your oxygen level at home.   Avoid contact with individuals who have a contagious illness.  Avoid extreme temperature and humidity changes.  Eat healthy foods. Eating smaller, more frequent meals and resting before meals may help you maintain your strength.  Stay active, but balance activity with periods of rest. Exercise and physical activity will help you maintain your ability to do things you want to do.  Preventing infection and hospitalization is very important when you have COPD. Make sure to receive all the vaccines your health care provider recommends,  especially the pneumococcal and influenza vaccines. Ask your health care provider whether you need a pneumonia vaccine.  Learn and use relaxation techniques to manage stress.  Learn and use controlled breathing techniques as directed by your health care provider.  Controlled breathing techniques include:   Pursed lip breathing. Start by breathing in (inhaling) through your nose for 1 second. Then, purse your lips as if you were going to whistle and breathe out (exhale) through the pursed lips for 2 seconds.   Diaphragmatic breathing. Start by putting one hand on your abdomen just above your waist. Inhale slowly through your nose. The hand on your abdomen should move out. Then purse your lips and exhale slowly. You should be able to feel the hand on your abdomen moving in as you exhale.   Learn and use controlled coughing to clear mucus from your lungs. Controlled coughing is a series of short, progressive coughs. The steps of controlled coughing are:   Lean your head slightly forward.   Breathe in deeply using diaphragmatic breathing.   Try to hold your breath for 3 seconds.   Keep your mouth slightly open while coughing twice.   Spit any mucus out into a tissue.   Rest and repeat the steps once or twice as needed. SEEK MEDICAL CARE IF:   You are coughing up more mucus than usual.   There is a change in the color or thickness of your mucus.   Your breathing is more labored than usual.   Your breathing is faster than usual.  SEEK IMMEDIATE MEDICAL CARE IF:   You have shortness of breath while you are resting.   You have shortness of breath that prevents you from:  Being able to talk.   Performing your usual physical activities.   You have chest pain lasting longer than 5 minutes.   Your skin color is more cyanotic than usual.  You measure low oxygen saturations for longer than 5 minutes with a pulse oximeter. MAKE SURE YOU:   Understand these instructions.  Will watch your condition.  Will get help right away if you are not doing well or get worse. Document Released: 11/29/2004 Document Revised: 07/06/2013 Document Reviewed: 10/16/2012 [facility] Patient Information 2015 [place], [place]. This information is not  intended to replace advice given to you by your health care provider. Make sure you discuss any questions you have with your health care provider.  Clostridium Difficile Infection Clostridium difficile (C. difficile) is a bacteria found in the intestinal tract or colon. Under certain conditions, it causes diarrhea and sometimes severe disease. The severe form of the disease is known as pseudomembranous colitis (often called C. difficile colitis). This disease can damage the lining of the colon or cause the colon to become enlarged (toxic megacolon). CAUSES Your colon normally contains many different bacteria, including C. difficile. The balance of bacteria in your colon can change during illness. This is especially true when you take antibiotic medicine. Taking antibiotics may allow the C. difficile to grow, multiply excessively, and make a toxin that then causes illness. The elderly and people with certain medical conditions have a greater risk of getting C. difficile infections. SYMPTOMS  Watery diarrhea.  Fever.  Fatigue.  Loss of appetite.  Nausea.  Abdominal swelling, pain, or tenderness.  Dehydration. DIAGNOSIS Your symptoms may make your caregiver suspect a C. difficile infection, especially if you have used antibiotics in the preceding weeks. However, there are only 2 ways to know for certain whether you have a  C. difficile infection:  A lab test that finds the toxin in your stool.  The specific appearance of an abnormality (pseudomembrane) in your colon. This can only be seen by doing a sigmoidoscopy or colonoscopy. These procedures involve passing an instrument through your rectum to look at the inside of your colon. Your caregiver will help determine if these tests are necessary. TREATMENT  Most people are successfully treated with one of two specific antibiotics, usually given by mouth. Other antibiotics you are receiving are stopped if possible.  Intravenous (IV) fluids  and correction of electrolyte imbalance may be necessary.  Rarely, surgery may be needed to remove the infected part of the intestines.  Careful hand washing by you and your caregivers is important to prevent the spread of infection. In the hospital, your caregivers may also put on gowns and gloves to prevent the spread of the C. difficile bacteria. Your room is also cleaned regularly with a solution containing bleach or a product that is known to kill C. difficile. HOME CARE INSTRUCTIONS  Drink enough fluids to keep your urine clear or pale yellow. Avoid milk, caffeine, and alcohol.  Ask your caregiver for specific rehydration instructions.  Try eating small, frequent meals rather than large meals.  Take your antibiotics as directed. Finish them even if you start to feel better.  Do not use medicines to slow diarrhea. This could delay healing or cause complications.  Wash your hands thoroughly after using the bathroom and before preparing food.  Make sure people who live with you wash their hands often, too.  Carefully disinfect all surfaces with a product that contains chlorine bleach. SEEK MEDICAL CARE IF:  Diarrhea persists longer than expected or recurs after completing your course of antibiotic treatment for the C. difficile infection.  You have trouble staying hydrated. SEEK IMMEDIATE MEDICAL CARE IF:  You develop a new fever.  You have increasing abdominal pain or tenderness.  There is blood in your stools, or your stools are dark black and tarry.  You cannot hold down food or liquids. MAKE SURE YOU:  Understand these instructions.  Will watch your condition.  Will get help right away if you are not doing well or get worse. Document Released: 11/29/2004 Document Revised: 07/06/2013 Document Reviewed: 07/28/2010 [facility] Patient Information 2015 [place], [place]. This information is not intended to replace advice given to you by your health care provider. Make sure  you discuss any questions you have with your health care provider.

## 2014-09-30 ENCOUNTER — Ambulatory Visit: Payer: Medicare HMO | Admitting: Pulmonary Disease

## 2014-10-07 ENCOUNTER — Ambulatory Visit (INDEPENDENT_AMBULATORY_CARE_PROVIDER_SITE_OTHER): Payer: Medicare HMO | Admitting: Internal Medicine

## 2014-10-07 ENCOUNTER — Encounter: Payer: Self-pay | Admitting: Internal Medicine

## 2014-10-07 VITALS — BP 140/68 | HR 60 | Ht 66.0 in | Wt 217.2 lb

## 2014-10-07 DIAGNOSIS — Z950 Presence of cardiac pacemaker: Secondary | ICD-10-CM | POA: Diagnosis not present

## 2014-10-07 DIAGNOSIS — I4891 Unspecified atrial fibrillation: Secondary | ICD-10-CM | POA: Diagnosis not present

## 2014-10-07 DIAGNOSIS — I10 Essential (primary) hypertension: Secondary | ICD-10-CM | POA: Diagnosis not present

## 2014-10-07 DIAGNOSIS — R0789 Other chest pain: Secondary | ICD-10-CM

## 2014-10-07 LAB — CUP PACEART INCLINIC DEVICE CHECK
Battery Remaining Longevity: 145 mo
Brady Statistic AP VP Percent: 1 %
Brady Statistic AP VS Percent: 6 %
Brady Statistic AS VP Percent: 0 %
Brady Statistic AS VS Percent: 93 %
Lead Channel Pacing Threshold Amplitude: 0.75 V
Lead Channel Pacing Threshold Pulse Width: 0.4 ms
Lead Channel Pacing Threshold Pulse Width: 0.4 ms
Lead Channel Sensing Intrinsic Amplitude: 8 mV
Lead Channel Setting Pacing Amplitude: 2 V
Lead Channel Setting Pacing Amplitude: 2.5 V
Lead Channel Setting Pacing Pulse Width: 0.4 ms
Lead Channel Setting Sensing Sensitivity: 4 mV
MDC IDC MSMT BATTERY IMPEDANCE: 156 Ohm
MDC IDC MSMT BATTERY VOLTAGE: 2.79 V
MDC IDC MSMT LEADCHNL RA IMPEDANCE VALUE: 476 Ohm
MDC IDC MSMT LEADCHNL RA SENSING INTR AMPL: 4 mV
MDC IDC MSMT LEADCHNL RV IMPEDANCE VALUE: 520 Ohm
MDC IDC MSMT LEADCHNL RV PACING THRESHOLD AMPLITUDE: 1 V
MDC IDC SESS DTM: 20160804170900

## 2014-10-07 NOTE — Assessment & Plan Note (Signed)
She is maintaining NSR 99.9% of the time. No change in meds.

## 2014-10-07 NOTE — Progress Notes (Signed)
HPI [name] returns today for followup. She is a very pleasant 59 year old woman with a history of symptomatic bradycardia secondary to complete heart block, status post permanent pacemaker insertion, who had a very extensive and prolonged hospital course over the last year resulting in tracheostomy placement with eventual removal, COPD, atrial fibrillation, hypertension, non-ST elevation MI, and previously uncontrolled diabetes. She has a remote h/o CAD, and today notes that her husband died 2 weeks ago after 42 years of marriage. He also notes back and arm pain and also some chest pain. She has class II dyspnea. She has had no syncope. She is now back on home oxygen therapy. Allergies  Allergen Reactions  . Ambien [Zolpidem Tartrate] Other (See Comments)    Severe hallucinations and behavior changes reported by family  . Ciprofloxacin     PATIENT AT VERY HIGH RISK FOR C DIFFICILE COLITIS  . Actos [Pioglitazone] Swelling  . Lunesta [Eszopiclone] Other (See Comments)    Nightmares & hallucinations  . Protonix [Pantoprazole Sodium] Other (See Comments)    Unknown reaction-"Doctor told me to never take it"     Current Outpatient Prescriptions  Medication Sig Dispense Refill  . albuterol (PROVENTIL HFA;VENTOLIN HFA) 108 (90 BASE) MCG/ACT inhaler Inhale 2 puffs into the lungs every 6 (six) hours as needed for wheezing or shortness of breath.    Marland Kitchen aspirin EC 81 MG tablet Take 81 mg by mouth 2 (two) times daily.    Marland Kitchen atorvastatin (LIPITOR) 10 MG tablet Take 10 mg by mouth at bedtime.    . budesonide-formoterol (SYMBICORT) 160-4.5 MCG/ACT inhaler Inhale 2 puffs into the lungs 2 (two) times daily.    . cefdinir (OMNICEF) 300 MG capsule Take 300 mg by mouth 2 (two) times daily.    . digoxin (LANOXIN) 0.25 MG tablet Take 0.25 mg by mouth daily.    Marland Kitchen diltiazem (TIAZAC) 300 MG 24 hr capsule Take 300 mg by mouth daily.     . Exenatide ER (BYDUREON) 2 MG PEN Inject 25 Units into the skin 2  (two) times daily.    . famotidine (PEPCID) 20 MG tablet Take 1 tablet (20 mg total) by mouth at bedtime. 30 tablet 1  . fluticasone (FLONASE) 50 MCG/ACT nasal spray Place 2 sprays into both nostrils 2 (two) times daily.     . furosemide (LASIX) 40 MG tablet Take 40 mg by mouth 2 (two) times daily.     Marland Kitchen HYDROcodone-acetaminophen (NORCO) 10-325 MG per tablet Take 1 tablet by mouth every 4 (four) hours as needed for moderate pain.     Marland Kitchen insulin aspart (NOVOLOG) 100 UNIT/ML injection Inject 3-8 Units into the skin 3 (three) times daily before meals. 150-200=3 units      anything >300=8 units    . lisinopril (PRINIVIL,ZESTRIL) 20 MG tablet Take 20 mg by mouth daily as needed (HIGH BP).     . metFORMIN (GLUCOPHAGE) 1000 MG tablet Take 1,000 mg by mouth 2 (two) times daily with a meal.    . methocarbamol (ROBAXIN) 750 MG tablet Take 750 mg by mouth 4 (four) times daily as needed for muscle spasms.    . OXYGEN Inhale 4 L into the lungs continuous.    Marland Kitchen rOPINIRole (REQUIP) 1 MG tablet Take 1 mg by mouth at bedtime.    . sertraline (ZOLOFT) 50 MG tablet Take 50 mg by mouth daily.    . traZODone (DESYREL) 50 MG tablet Take 100 mg by mouth at bedtime as needed  for sleep.      No current facility-administered medications for this visit.     Past Medical History  Diagnosis Date  . COPD (chronic obstructive pulmonary disease)   . Chronic respiratory failure   . Morbid obesity   . Tobacco abuse   . GERD (gastroesophageal reflux disease)   . Hyperlipidemia   . Chronic pain   . Hypertension   . Coronary vasospasm     a. 03/2004 NSTEMI/Cath: EF 45%, LM nl, LAD nl, D1 nl, D2 nl, LCX nl, OM1 large-nl, RCA-spasm noted, otw nl.  . Arthritis   . Allergic rhinitis   . Diabetes mellitus type 2 in obese 02/29/2012  . HTN (hypertension) 02/29/2012  . 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 recurrent in setting of abx noncompliance.  . Chronic kidney disease 09/2012    acute renal failure  . Shock 09/2012  .  H1N1 influenza   . Shortness of breath dyspnea   . CHF (congestive heart failure)     ROS:   All systems reviewed and negative except as noted in the HPI.   Past Surgical History  Procedure Laterality Date  . Cholecystectomy    . Partial hysterectomy    . Angioplasty    . Umbilical hernia repair  12/2009  . Permanent pacemaker insertion N/A 10/16/2012    Procedure: PERMANENT PACEMAKER INSERTION;  Surgeon: Marinus Maw, MD;  Location: [facility] CATH LAB;  Service: Cardiovascular;  Laterality: N/A;  . Insert / replace / remove pacemaker       Family History  Problem Relation Age of Onset  . Other Mother     borderline diabetic  . COPD Mother   . Heart Problems Father   . Cancer Father     lung  . Heart Problems Maternal Aunt   . Heart Problems Maternal Uncle   . Heart Problems Maternal Grandmother   . Heart failure Maternal Grandmother   . Heart Problems Maternal Grandfather   . Heart Problems Paternal Grandmother   . Heart Problems Paternal Grandfather   . Heart attack Daughter 73     History   Social History  . Marital Status: Married    Spouse Name: N/A  . Number of Children: 1  . Years of Education: N/A   Occupational History  . Environmental education officer   Social History Main Topics  . Smoking status: Former Smoker -- 2.00 packs/day for 40 years    Types: Cigarettes    Quit date: 03/04/2012  . Smokeless tobacco: Never Used  . Alcohol Use: No  . Drug Use: No  . Sexual Activity: Not on file   Other Topics Concern  . Not on file   Social History Narrative   Lives in [place] with her husband.  She does not routinely exercise.     BP 140/68 mmHg  Pulse 60  Ht 5\' 6"  (1.676 m)  Wt 217 lb 3.2 oz (98.521 kg)  BMI 35.07 kg/m2  Physical Exam:  Stable but chronically ill appearing middle-age woman,NAD HEENT: Unremarkable Neck:  7 cm JVD, no thyromegally, old tracheostomy scar, well-healed Back:  No CVA tenderness Lungs:  Clear, except for scattered  rales in the bases, with no wheezes, or rhonchi. Well-healed pacemaker incision HEART:  Regular rate rhythm, no murmurs, no rubs, no clicks Abd:  soft, positive bowel sounds, no organomegally, no rebound, no guarding Ext:  2 plus pulses, no edema, no cyanosis, no clubbing Skin:  No rashes no  nodules Neuro:  CN II through XII intact, motor grossly intact   DEVICE  Normal device function.  See PaceArt for details.   Assess/Plan:

## 2014-10-07 NOTE — Assessment & Plan Note (Signed)
Her blood pressure is fairly well controlled. No change in meds. 

## 2014-10-07 NOTE — Assessment & Plan Note (Signed)
The etiology of her symptoms is unclear. She has multiple cardiac risk factors, a pacemaker and cannot walk as she is on oxygen. Will scheduel lexiscan myoview.

## 2014-10-07 NOTE — Patient Instructions (Addendum)
Medication Instructions:  Your physician recommends that you continue on your current medications as directed. Please refer to the Current Medication list given to you today.   Labwork: None ordered  Testing/Procedures: Your physician has requested that you have a lexiscan myoview. For further information please visit [email or web address] Please follow instruction sheet, as given.---will call you with the results after MD reviews    Follow-Up: Your physician wants you to follow-up in: 12 months with Dr Court Joy will receive a reminder letter in the mail two months in advance. If you don't receive a letter, please call our office to schedule the follow-up appointment.   Any Other Special Instructions Will Be Listed Below (If Applicable).

## 2014-10-07 NOTE — Assessment & Plan Note (Signed)
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 Will recheck in several months.

## 2014-10-11 ENCOUNTER — Telehealth (HOSPITAL_COMMUNITY): Payer: Self-pay

## 2014-10-11 NOTE — Telephone Encounter (Signed)
Patient given detailed instructions per Myocardial Perfusion Study Information Sheet for test on 10-14-2014 at 0830. Patient Notified to arrive 15 minutes early, and that it is imperative to arrive on time for appointment to keep from having the test rescheduled. Patient verbalized understanding. Randa Evens, Shalandra Leu A

## 2014-10-12 ENCOUNTER — Encounter (HOSPITAL_COMMUNITY): Payer: Medicare HMO

## 2014-10-12 ENCOUNTER — Encounter: Payer: Self-pay | Admitting: Adult Health

## 2014-10-12 ENCOUNTER — Ambulatory Visit (INDEPENDENT_AMBULATORY_CARE_PROVIDER_SITE_OTHER): Payer: Medicare HMO | Admitting: Adult Health

## 2014-10-12 VITALS — BP 120/64 | HR 73 | Temp 98.2°F | Ht 66.0 in | Wt 215.0 lb

## 2014-10-12 DIAGNOSIS — J449 Chronic obstructive pulmonary disease, unspecified: Secondary | ICD-10-CM

## 2014-10-12 DIAGNOSIS — Z23 Encounter for immunization: Secondary | ICD-10-CM | POA: Diagnosis not present

## 2014-10-12 DIAGNOSIS — J9610 Chronic respiratory failure, unspecified whether with hypoxia or hypercapnia: Secondary | ICD-10-CM | POA: Insufficient documentation

## 2014-10-12 DIAGNOSIS — J9611 Chronic respiratory failure with hypoxia: Secondary | ICD-10-CM | POA: Diagnosis not present

## 2014-10-12 NOTE — Assessment & Plan Note (Signed)
Recent exacerbation, now resolved  Plan Prevnar 13 today .  Begin Spiriva Respimat 2 puffs daily  Continue on Oxygen 4l/m .  Follow up Dr. Vassie Loll  In 6 weeks  and As needed

## 2014-10-12 NOTE — Patient Instructions (Addendum)
Prevnar 13 today .  Begin Spiriva Respimat 2 puffs daily  Continue on Oxygen 4l/m .  Follow up Dr. Vassie Loll  In 6 weeks  and As needed

## 2014-10-12 NOTE — Assessment & Plan Note (Signed)
Compensated on oxygen 

## 2014-10-12 NOTE — Progress Notes (Signed)
Subjective:    Patient ID: [name], female    DOB: 02-05-1956, 59 y.o.   MRN: [id number]  HPI57/F , obese , ex-heavy  smoker for FU of COPD   On O2 2L/min since admission 11/2009 for incarcerated umbilical hernia complicated by post op resp failure requiring mechanical ventilation. She reports sporadic compliance with this.   EKG - RBBB  Admitted 02/29/12 w/ H1N1 w/ ARDS complicated by secondary pneumococcal PNA . She had a prolonged course w/ tracheostomy . 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  Was sucessfully liberated from vent and decannulated 04/14/12.  c/o diarrhea since hospital d/c - C diff POS -PCP treating with flagyl & ? Po vanc     Admitted 8/7-28/14 for recurrent C.diff ,AKI. 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 Difficile PCR was negative) and treated with a course of 10 d Fidaxomicin, followed by oral vanc with planned vanc taper.  She developed atrial fib/flutter on 8/12 and On 8/13 she had 50 sec episode of ventricular stand-still with loss of consciousness.  She was felt to have tachy/brady syndrome and episode of complete heart block and required permanent pacemaker placement  She further had an episode of hypercarbic failure due to narcotics and another due to pulmonary edema  she is able to go without oxygen for limited periods of time C/o left shoulder pain Remains on Xopenex Four times a day And Budesonide Twice daily  Diarrhea - took 10 ds of PO vanc, even though C. Difficile PCR negative No fu appt with EP scheduled O2 satn 88 % on RA, required 4L while walking >>ov set up with Cards   05/14/13 Follow up COPD  Returns for 3 month follow up .  Breathing has improved. On O2 when ambulating and RA when still. . Denies SOB and CP.  On O2 3-4 L with walking and At bedtime  .  PCP is out of office d/t illness. Requested our office check bmet .  Not taking Nebs for several months, out  of due to cost .  Has taken symbicort and Advair in past.  She denies any chest pain, hemoptysis, orthopnea, PND, leg swelling. >  10/12/2014 Follow up : COPD and Chronic Hypoxic Resp Failrue on O2 at 4l/m  Patient returns for a follow-up for COPD. Recent hospitalization for COPD exacerbation and C. difficile colitis, chest x-ray showed no acute process. Currently 4 L of oxygen . She has a portable concentrator that uses pulsing oxygen .   We discussed options for oxygen delivery systems and unfortunately there are no devices that provide high flow continuous oxygen . Patient has been under a lot of stress lately with the recent passing of her husband last month due to heart attack . She is somewhat tearful support provided . She is on lisinopril but says that she rarely takes this as she is only to use visit, her blood pressure is elevated . She denies any chest pain, orthopnea, PND, or increased leg swelling   She is feeling better since her hospital stay in June but still gets short of breath with activity. At times . Has a mentally productive cough, most days .  Review of Systems neg for any significant sore throat, dysphagia, itching, sneezing, nasal congestion or excess/ purulent secretions, fever, chills, sweats, unintended wt loss, pleuritic or exertional cp, hempoptysis, orthopnea pnd or change in chronic leg swelling. Also denies presyncope,  palpitations, heartburn, abdominal pain, nausea, vomiting, diarrhea or change in bowel or urinary habits, dysuria,hematuria, rash, arthralgias, visual complaints, headache, numbness weakness or ataxia.     Objective:   Physical Exam  Gen. Pleasant, morbidly obese , in no distress ENT - no lesions, no post nasal drip Neck: No JVD, no thyromegaly, no carotid bruits Lungs: no use of accessory muscles, no dullness to percussion, clear without rales or rhonchi  Cardiovascular: Rhythm regular, heart sounds  normal, no murmurs or gallops, tr   peripheral edema Musculoskeletal: No deformities, no cyanosis or clubbing   CXR 11/07/12 >Confluent abnormal opacity at the right lung base, favor combination of small pleural effusion, consolidation, and some opacity from the right Bochdalek's hernia seen on the prior CT.   CXR 08/23/14  Cardiomegaly and pulmonary vascular congestion.      Assessment & Plan:

## 2014-10-14 ENCOUNTER — Ambulatory Visit (HOSPITAL_COMMUNITY): Payer: Medicare HMO | Attending: Internal Medicine

## 2014-10-14 VITALS — Ht 66.0 in | Wt 217.0 lb

## 2014-10-14 DIAGNOSIS — R9439 Abnormal result of other cardiovascular function study: Secondary | ICD-10-CM | POA: Insufficient documentation

## 2014-10-14 DIAGNOSIS — I517 Cardiomegaly: Secondary | ICD-10-CM | POA: Diagnosis not present

## 2014-10-14 DIAGNOSIS — R0609 Other forms of dyspnea: Secondary | ICD-10-CM

## 2014-10-14 DIAGNOSIS — R0789 Other chest pain: Secondary | ICD-10-CM | POA: Diagnosis not present

## 2014-10-14 LAB — MYOCARDIAL PERFUSION IMAGING
CHL CUP NUCLEAR SDS: 1
CHL CUP NUCLEAR SSS: 12
CSEPPHR: 91 {beats}/min
LV dias vol: 190 mL
LV sys vol: 106 mL
RATE: 0.38
Rest HR: 70 {beats}/min
SRS: 11
TID: 1.01

## 2014-10-14 MED ORDER — ALBUTEROL SULFATE (2.5 MG/3ML) 0.083% IN NEBU
5.0000 mg | INHALATION_SOLUTION | Freq: Once | RESPIRATORY_TRACT | Status: AC
  Administered 2014-10-14: 5 mg via RESPIRATORY_TRACT

## 2014-10-15 NOTE — Progress Notes (Signed)
Reviewed & agree with plan  

## 2014-10-28 ENCOUNTER — Telehealth: Payer: Self-pay | Admitting: Internal Medicine

## 2014-10-28 NOTE — Telephone Encounter (Signed)
Dr Tenny Craw looked and study for me and there is no ischemia noted.  Scar from old MI and EF at 45%.  Patient is aware and she is going to try and start walking on her treadmill at home to help her

## 2014-10-28 NOTE — Telephone Encounter (Signed)
New message    Pt is wanting results from her stress test please give   pt and Mrs Chilton Si a call, thank you

## 2014-11-25 ENCOUNTER — Ambulatory Visit: Payer: Medicare HMO | Admitting: Pulmonary Disease

## 2014-12-10 IMAGING — CR DG CHEST 2V
2 series · 2 of 2 positions shown · non-contrast
Comparison: 11/19/2010

CLINICAL DATA: Dyspnea

CHEST - 2 VIEW

[x chest ap]
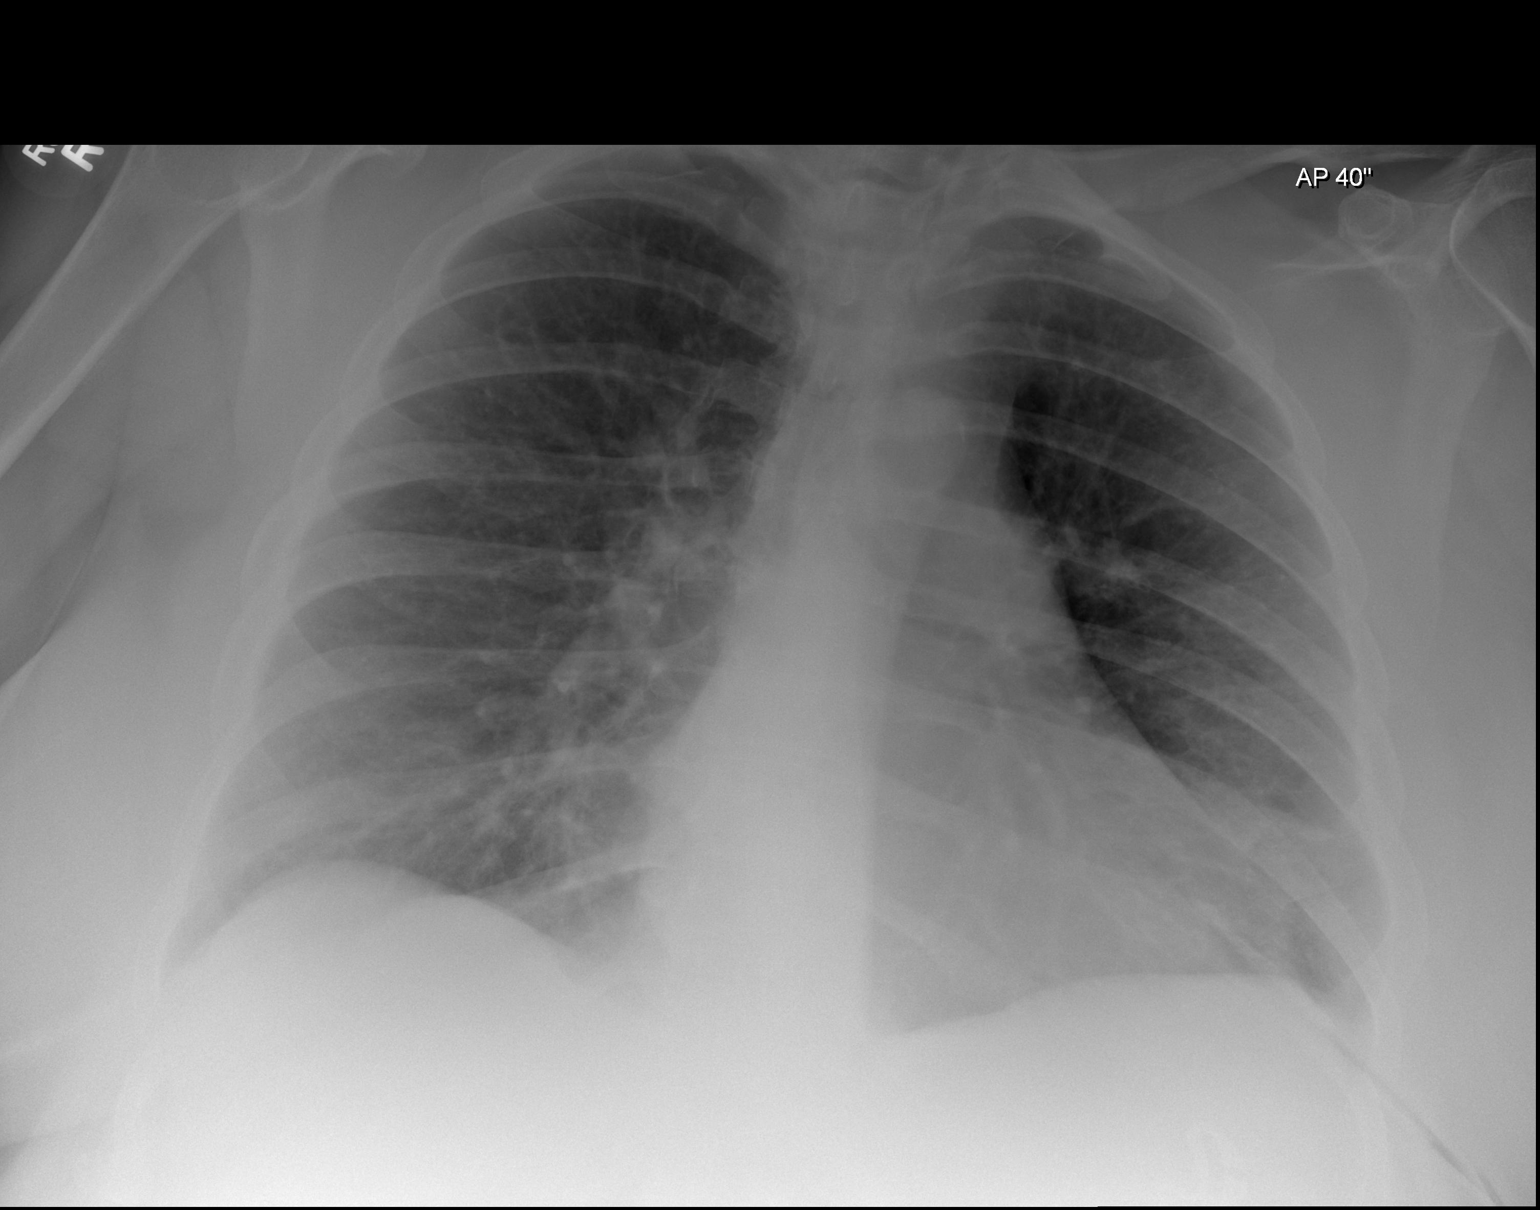

[w chest lat]
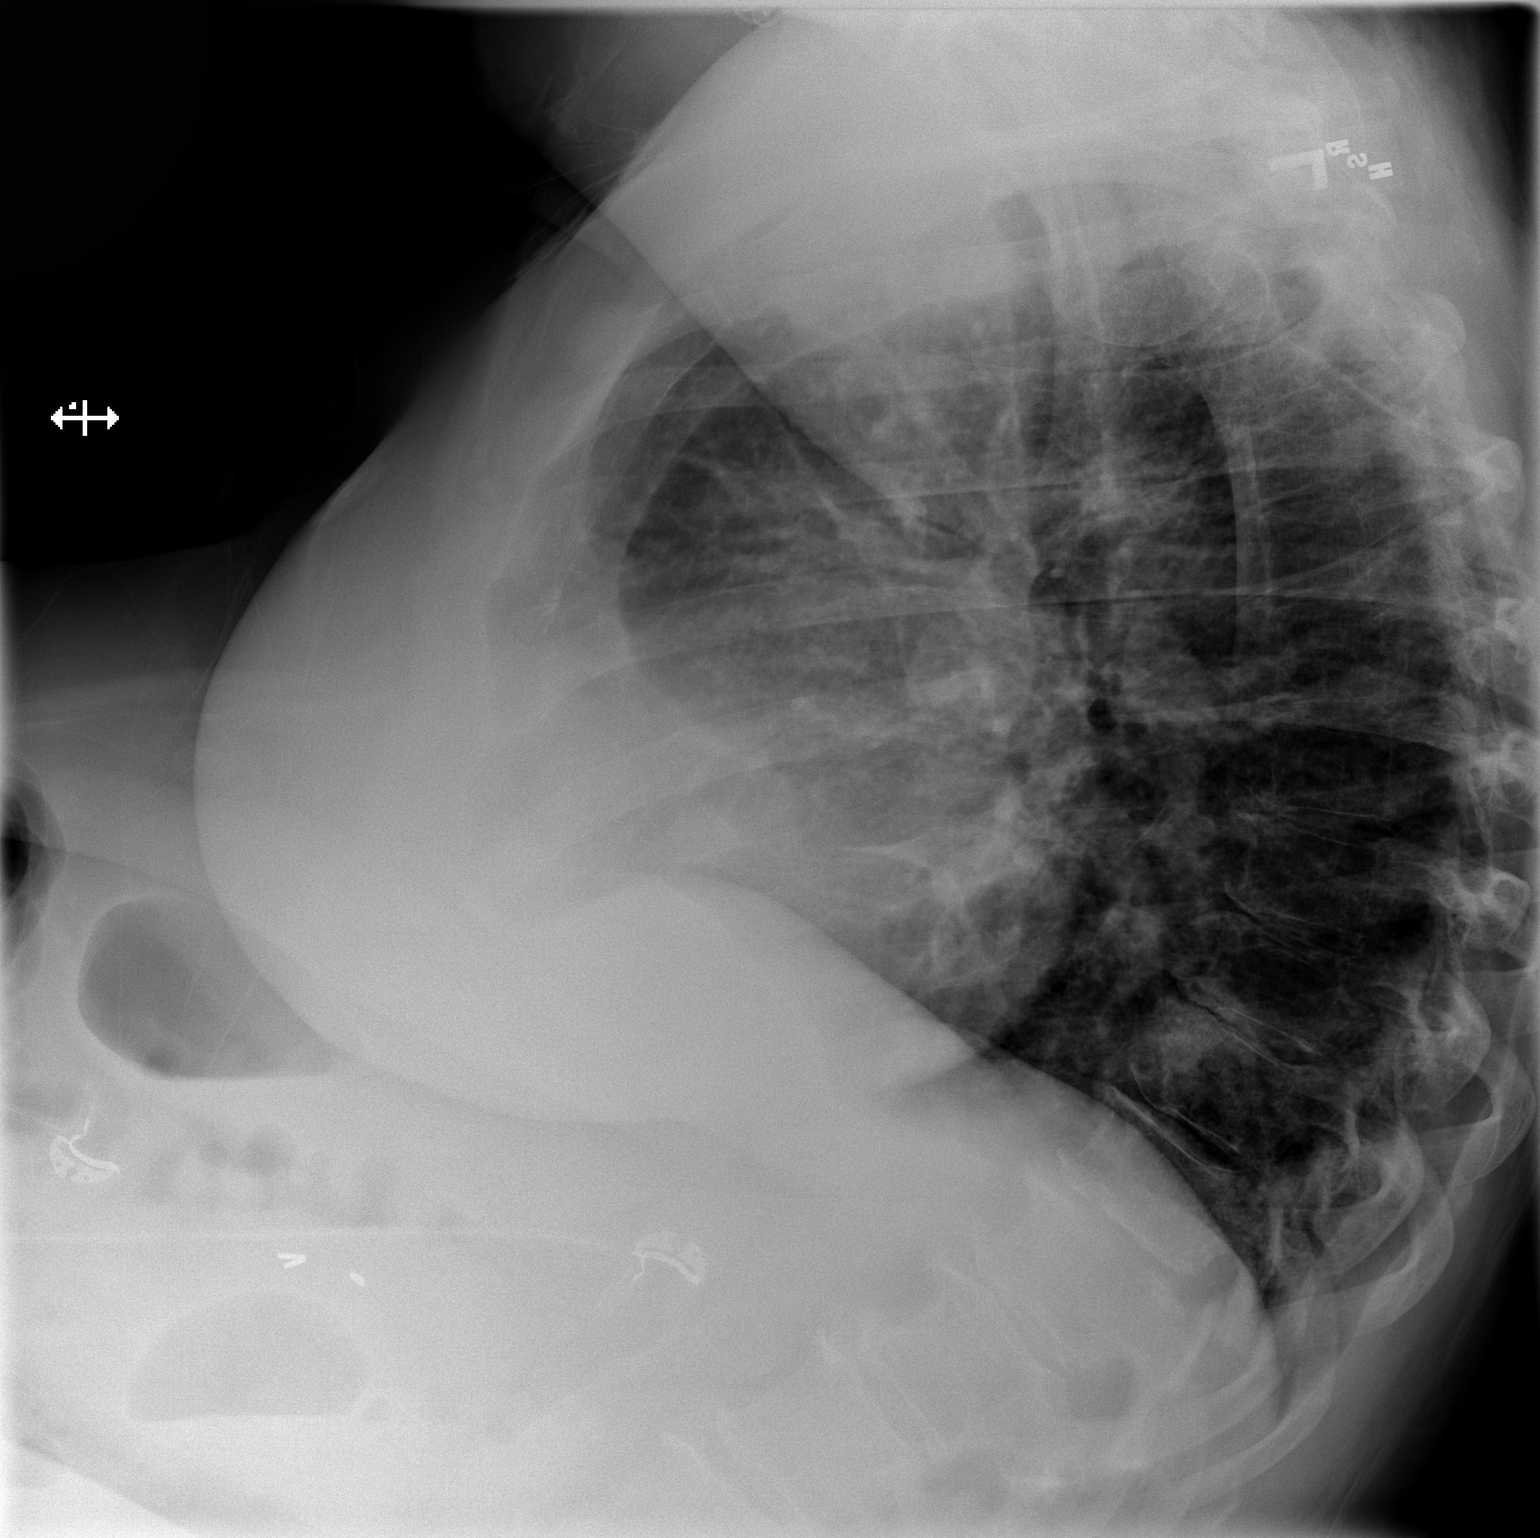

[2 of 2 positions shown; findings below may reference images not displayed]

FINDINGS: Vascular clips in the upper abdomen.  Coarse interstitial
opacities in the lung bases as before.  No confluent airspace
infiltrate or overt edema.  No effusion.  Heart size normal for
technique.  Mild aortic plaque.  Spurring in the lower thoracic
spine.
IMPRESSION: 1.  Stable chronic changes.  No acute disease.

## 2014-12-29 ENCOUNTER — Other Ambulatory Visit: Payer: Self-pay

## 2014-12-29 DIAGNOSIS — Z1231 Encounter for screening mammogram for malignant neoplasm of breast: Secondary | ICD-10-CM

## 2014-12-30 IMAGING — CR DG ABD PORTABLE 1V
1 series · 1 of 1 positions shown · non-contrast
Comparison: Prior abdominal radiograph 03/19/2012

CLINICAL DATA: Ileus, abdominal pain

PORTABLE ABDOMEN - 1 VIEW

[AP]
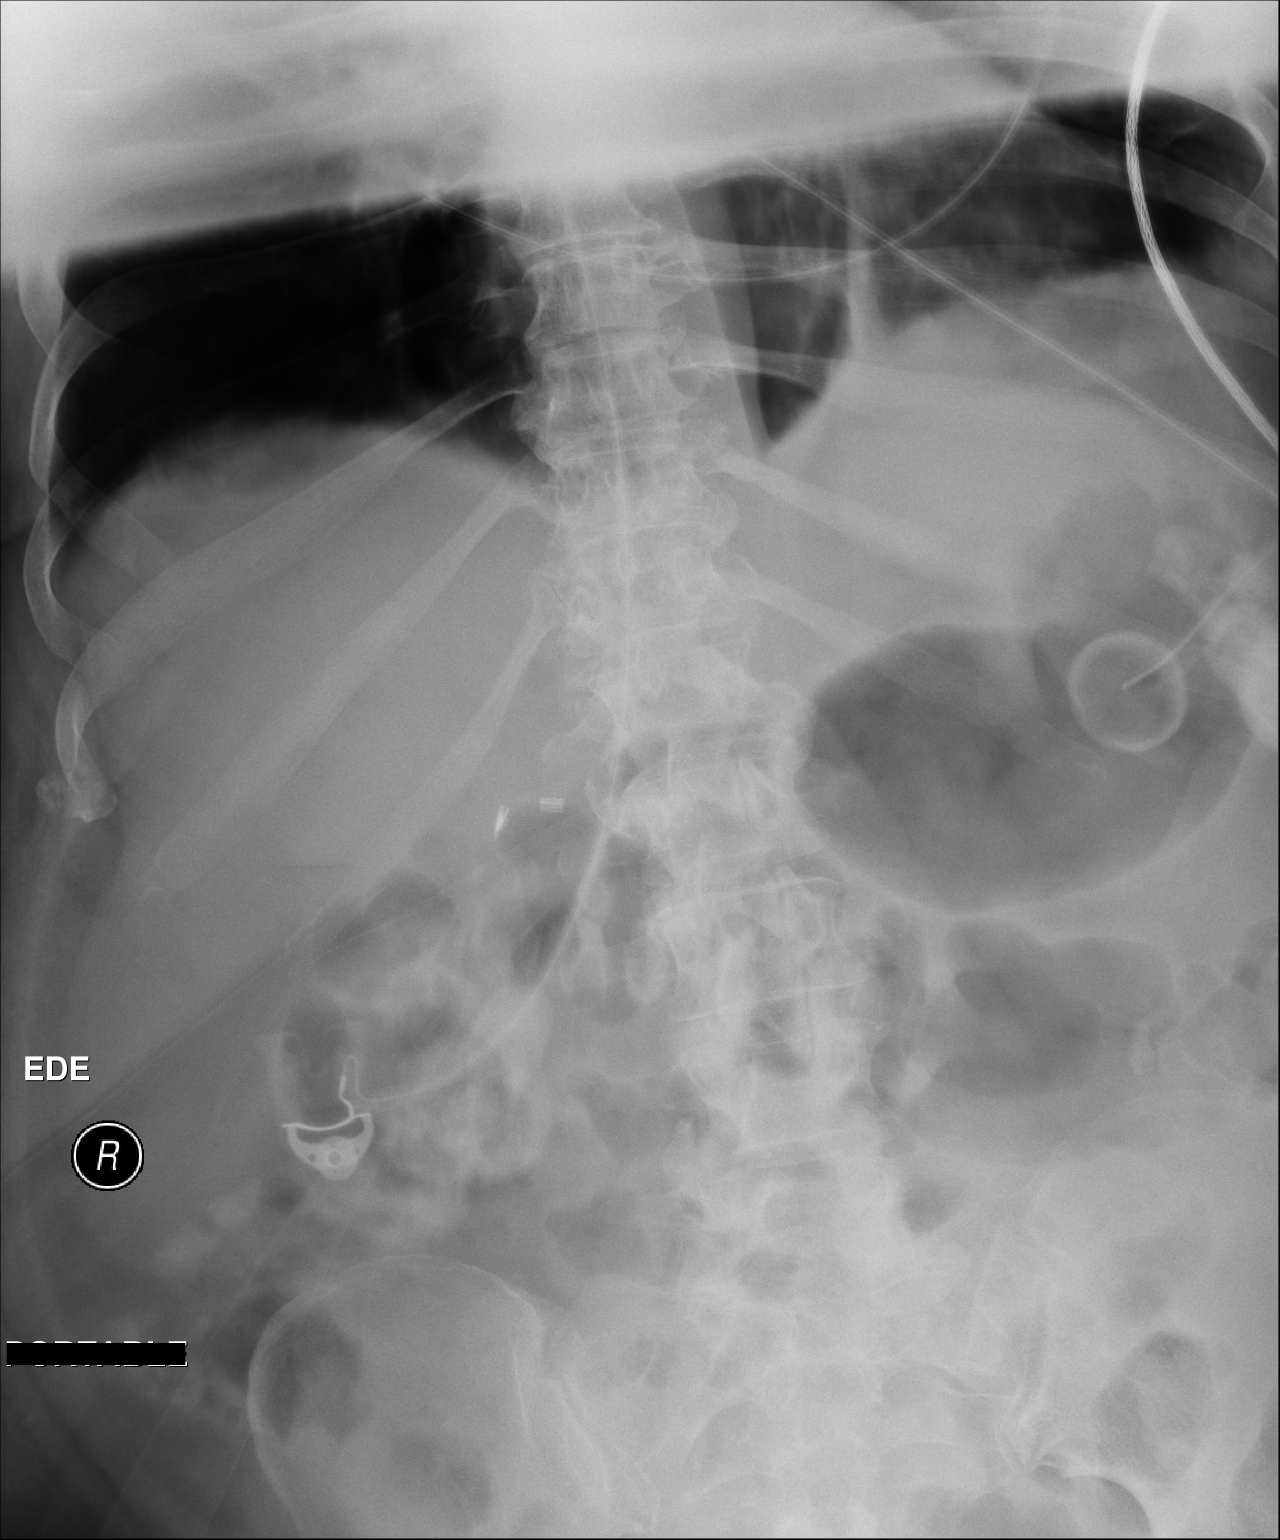

[1 of 1 positions shown; findings below may reference images not displayed]

FINDINGS: Flange retention pull-through gastrostomy tube projects
over the gastric bubble.  Limited evaluation of the abdomen
demonstrates gas within loops of nondilated large and small bowel.
Expected distal progression of barium within the colon.  There is a
trace amount of barium remaining.  No massive free air on this
single supine view.  Surgical clips the right upper quadrant from
prior cholecystectomy.  Linear scar versus atelectasis in the left
lung base.
IMPRESSION: The visualized bowel gas pattern is nonobstructed.

Expected distal progression of barium, only a trace amount of
barium remains within the visualized colon.

## 2014-12-31 ENCOUNTER — Encounter (HOSPITAL_COMMUNITY): Payer: Self-pay | Admitting: Emergency Medicine

## 2014-12-31 ENCOUNTER — Inpatient Hospital Stay (HOSPITAL_COMMUNITY)
Admission: EM | Admit: 2014-12-31 | Discharge: 2015-01-05 | DRG: 287 | Disposition: A | Discharge status: Home / Self Care | Payer: Medicare HMO | Attending: Cardiology | Admitting: Cardiology

## 2014-12-31 ENCOUNTER — Emergency Department (HOSPITAL_COMMUNITY): Payer: Medicare HMO

## 2014-12-31 DIAGNOSIS — Z7951 Long term (current) use of inhaled steroids: Secondary | ICD-10-CM

## 2014-12-31 DIAGNOSIS — G8929 Other chronic pain: Secondary | ICD-10-CM | POA: Diagnosis present

## 2014-12-31 DIAGNOSIS — Z794 Long term (current) use of insulin: Secondary | ICD-10-CM

## 2014-12-31 DIAGNOSIS — Z825 Family history of asthma and other chronic lower respiratory diseases: Secondary | ICD-10-CM

## 2014-12-31 DIAGNOSIS — I429 Cardiomyopathy, unspecified: Secondary | ICD-10-CM | POA: Diagnosis present

## 2014-12-31 DIAGNOSIS — E1169 Type 2 diabetes mellitus with other specified complication: Secondary | ICD-10-CM

## 2014-12-31 DIAGNOSIS — N179 Acute kidney failure, unspecified: Secondary | ICD-10-CM | POA: Diagnosis present

## 2014-12-31 DIAGNOSIS — Z881 Allergy status to other antibiotic agents status: Secondary | ICD-10-CM | POA: Diagnosis not present

## 2014-12-31 DIAGNOSIS — I110 Hypertensive heart disease with heart failure: Principal | ICD-10-CM | POA: Diagnosis present

## 2014-12-31 DIAGNOSIS — E119 Type 2 diabetes mellitus without complications: Secondary | ICD-10-CM | POA: Diagnosis not present

## 2014-12-31 DIAGNOSIS — Z8674 Personal history of sudden cardiac arrest: Secondary | ICD-10-CM

## 2014-12-31 DIAGNOSIS — I5041 Acute combined systolic (congestive) and diastolic (congestive) heart failure: Secondary | ICD-10-CM | POA: Diagnosis present

## 2014-12-31 DIAGNOSIS — J449 Chronic obstructive pulmonary disease, unspecified: Secondary | ICD-10-CM | POA: Diagnosis present

## 2014-12-31 DIAGNOSIS — I252 Old myocardial infarction: Secondary | ICD-10-CM | POA: Diagnosis not present

## 2014-12-31 DIAGNOSIS — Z8249 Family history of ischemic heart disease and other diseases of the circulatory system: Secondary | ICD-10-CM | POA: Diagnosis not present

## 2014-12-31 DIAGNOSIS — Z888 Allergy status to other drugs, medicaments and biological substances status: Secondary | ICD-10-CM | POA: Diagnosis not present

## 2014-12-31 DIAGNOSIS — I10 Essential (primary) hypertension: Secondary | ICD-10-CM

## 2014-12-31 DIAGNOSIS — Z87891 Personal history of nicotine dependence: Secondary | ICD-10-CM

## 2014-12-31 DIAGNOSIS — J9612 Chronic respiratory failure with hypercapnia: Secondary | ICD-10-CM | POA: Diagnosis present

## 2014-12-31 DIAGNOSIS — Z79899 Other long term (current) drug therapy: Secondary | ICD-10-CM

## 2014-12-31 DIAGNOSIS — I480 Paroxysmal atrial fibrillation: Secondary | ICD-10-CM | POA: Diagnosis present

## 2014-12-31 DIAGNOSIS — Z950 Presence of cardiac pacemaker: Secondary | ICD-10-CM | POA: Diagnosis not present

## 2014-12-31 DIAGNOSIS — I442 Atrioventricular block, complete: Secondary | ICD-10-CM

## 2014-12-31 DIAGNOSIS — R079 Chest pain, unspecified: Secondary | ICD-10-CM | POA: Diagnosis not present

## 2014-12-31 DIAGNOSIS — I200 Unstable angina: Secondary | ICD-10-CM | POA: Diagnosis not present

## 2014-12-31 DIAGNOSIS — Z7982 Long term (current) use of aspirin: Secondary | ICD-10-CM | POA: Diagnosis not present

## 2014-12-31 DIAGNOSIS — Z9981 Dependence on supplemental oxygen: Secondary | ICD-10-CM | POA: Diagnosis not present

## 2014-12-31 DIAGNOSIS — E785 Hyperlipidemia, unspecified: Secondary | ICD-10-CM | POA: Diagnosis present

## 2014-12-31 DIAGNOSIS — Z8679 Personal history of other diseases of the circulatory system: Secondary | ICD-10-CM

## 2014-12-31 DIAGNOSIS — E669 Obesity, unspecified: Secondary | ICD-10-CM

## 2014-12-31 DIAGNOSIS — I201 Angina pectoris with documented spasm: Secondary | ICD-10-CM | POA: Diagnosis present

## 2014-12-31 DIAGNOSIS — Z885 Allergy status to narcotic agent status: Secondary | ICD-10-CM

## 2014-12-31 DIAGNOSIS — I4891 Unspecified atrial fibrillation: Secondary | ICD-10-CM

## 2014-12-31 DIAGNOSIS — I428 Other cardiomyopathies: Secondary | ICD-10-CM

## 2014-12-31 DIAGNOSIS — K219 Gastro-esophageal reflux disease without esophagitis: Secondary | ICD-10-CM | POA: Diagnosis present

## 2014-12-31 DIAGNOSIS — F411 Generalized anxiety disorder: Secondary | ICD-10-CM | POA: Diagnosis present

## 2014-12-31 DIAGNOSIS — Z7984 Long term (current) use of oral hypoglycemic drugs: Secondary | ICD-10-CM | POA: Diagnosis not present

## 2014-12-31 DIAGNOSIS — I2510 Atherosclerotic heart disease of native coronary artery without angina pectoris: Secondary | ICD-10-CM

## 2014-12-31 DIAGNOSIS — I5042 Chronic combined systolic (congestive) and diastolic (congestive) heart failure: Secondary | ICD-10-CM | POA: Diagnosis present

## 2014-12-31 HISTORY — DX: Personal history of other diseases of the circulatory system: Z86.79

## 2014-12-31 HISTORY — DX: Chronic combined systolic (congestive) and diastolic (congestive) heart failure: I50.42

## 2014-12-31 HISTORY — DX: Paroxysmal atrial fibrillation: I48.0

## 2014-12-31 HISTORY — DX: Chest pain, unspecified: R07.9

## 2014-12-31 LAB — COMPREHENSIVE METABOLIC PANEL
ALT: 34 U/L (ref 14–54)
AST: 41 U/L (ref 15–41)
Albumin: 3.5 g/dL (ref 3.5–5.0)
Alkaline Phosphatase: 49 U/L (ref 38–126)
Anion gap: 12 (ref 5–15)
BUN: 27 mg/dL — AB (ref 6–20)
CHLORIDE: 87 mmol/L — AB (ref 101–111)
CO2: 33 mmol/L — AB (ref 22–32)
CREATININE: 1.45 mg/dL — AB (ref 0.44–1.00)
Calcium: 8.7 mg/dL — ABNORMAL LOW (ref 8.9–10.3)
GFR calc Af Amer: 44 mL/min — ABNORMAL LOW (ref >60)
GFR calc non Af Amer: 38 mL/min — ABNORMAL LOW (ref >60)
Glucose, Bld: 380 mg/dL — ABNORMAL HIGH (ref 65–99)
Potassium: 4.6 mmol/L (ref 3.5–5.1)
SODIUM: 132 mmol/L — AB (ref 135–145)
Total Bilirubin: 1 mg/dL (ref 0.3–1.2)
Total Protein: 6.3 g/dL — ABNORMAL LOW (ref 6.5–8.1)

## 2014-12-31 LAB — I-STAT TROPONIN, ED: Troponin i, poc: 0.02 ng/mL (ref 0.00–0.08)

## 2014-12-31 LAB — CBC WITH DIFFERENTIAL/PLATELET
Basophils Absolute: 0 10*3/uL (ref 0.0–0.1)
Basophils Relative: 0 %
EOS ABS: 0.1 10*3/uL (ref 0.0–0.7)
EOS PCT: 1 %
HCT: 35.1 % — ABNORMAL LOW (ref 36.0–46.0)
Hemoglobin: 10.8 g/dL — ABNORMAL LOW (ref 12.0–15.0)
Lymphocytes Relative: 18 %
Lymphs Abs: 1.6 10*3/uL (ref 0.7–4.0)
MCH: 28 pg (ref 26.0–34.0)
MCHC: 30.8 g/dL (ref 30.0–36.0)
MCV: 90.9 fL (ref 78.0–100.0)
Monocytes Absolute: 0.5 10*3/uL (ref 0.1–1.0)
Monocytes Relative: 6 %
Neutro Abs: 6.8 10*3/uL (ref 1.7–7.7)
Neutrophils Relative %: 75 %
PLATELETS: 246 10*3/uL (ref 150–400)
RBC: 3.86 MIL/uL — AB (ref 3.87–5.11)
RDW: 14.3 % (ref 11.5–15.5)
WBC: 9 10*3/uL (ref 4.0–10.5)

## 2014-12-31 LAB — MAGNESIUM: Magnesium: 1.5 mg/dL — ABNORMAL LOW (ref 1.7–2.4)

## 2014-12-31 LAB — DIGOXIN LEVEL: Digoxin Level: 1.2 ng/mL (ref 0.8–2.0)

## 2014-12-31 LAB — MRSA PCR SCREENING: MRSA by PCR: NEGATIVE

## 2014-12-31 LAB — TSH: TSH: 3.089 u[IU]/mL (ref 0.350–4.500)

## 2014-12-31 LAB — PROTIME-INR
INR: 1.03 (ref 0.00–1.49)
Prothrombin Time: 13.7 seconds (ref 11.6–15.2)

## 2014-12-31 LAB — GLUCOSE, CAPILLARY: Glucose-Capillary: 306 mg/dL — ABNORMAL HIGH (ref 65–99)

## 2014-12-31 LAB — TROPONIN I: Troponin I: 0.03 ng/mL (ref <0.031)

## 2014-12-31 MED ORDER — ACETAMINOPHEN 325 MG PO TABS
650.0000 mg | ORAL_TABLET | ORAL | Status: DC | PRN
  Administered 2015-01-03: 650 mg via ORAL
  Filled 2014-12-31: qty 2

## 2014-12-31 MED ORDER — FENTANYL CITRATE (PF) 100 MCG/2ML IJ SOLN: 12.5000 ug | INTRAMUSCULAR | Status: DC | PRN

## 2014-12-31 MED ORDER — BUDESONIDE-FORMOTEROL FUMARATE 160-4.5 MCG/ACT IN AERO
2.0000 | INHALATION_SPRAY | Freq: Two times a day (BID) | RESPIRATORY_TRACT | Status: DC
  Administered 2015-01-01 – 2015-01-05 (×9): 2 via RESPIRATORY_TRACT
  Filled 2014-12-31: qty 6

## 2014-12-31 MED ORDER — INSULIN ASPART 100 UNIT/ML ~~LOC~~ SOLN
0.0000 [IU] | Freq: Every day | SUBCUTANEOUS | Status: DC
  Administered 2014-12-31: 4 [IU] via SUBCUTANEOUS
  Administered 2015-01-01: 3 [IU] via SUBCUTANEOUS
  Administered 2015-01-02 – 2015-01-03 (×2): 4 [IU] via SUBCUTANEOUS

## 2014-12-31 MED ORDER — HEPARIN BOLUS VIA INFUSION
4000.0000 [IU] | Freq: Once | INTRAVENOUS | Status: DC
  Filled 2014-12-31: qty 4000

## 2014-12-31 MED ORDER — ALBUTEROL SULFATE HFA 108 (90 BASE) MCG/ACT IN AERS: 2.0000 | INHALATION_SPRAY | Freq: Four times a day (QID) | RESPIRATORY_TRACT | Status: DC | PRN

## 2014-12-31 MED ORDER — CHLORHEXIDINE GLUCONATE CLOTH 2 % EX PADS
6.0000 | MEDICATED_PAD | Freq: Once | CUTANEOUS | Status: AC
  Administered 2014-12-31: 6 via TOPICAL

## 2014-12-31 MED ORDER — HEPARIN (PORCINE) IN NACL 100-0.45 UNIT/ML-% IJ SOLN
900.0000 [IU]/h | INTRAMUSCULAR | Status: DC
Start: 2014-12-31 — End: 2014-12-31
  Filled 2014-12-31: qty 250

## 2014-12-31 MED ORDER — ALBUTEROL SULFATE (2.5 MG/3ML) 0.083% IN NEBU
2.5000 mg | INHALATION_SOLUTION | Freq: Four times a day (QID) | RESPIRATORY_TRACT | Status: DC | PRN
  Administered 2015-01-03: 2.5 mg via RESPIRATORY_TRACT
  Filled 2014-12-31: qty 3

## 2014-12-31 MED ORDER — HYDROCODONE-ACETAMINOPHEN 10-325 MG PO TABS
1.0000 | ORAL_TABLET | Freq: Three times a day (TID) | ORAL | Status: DC
  Administered 2014-12-31 – 2015-01-05 (×14): 1 via ORAL
  Filled 2014-12-31 (×14): qty 1

## 2014-12-31 MED ORDER — ASPIRIN EC 81 MG PO TBEC
81.0000 mg | DELAYED_RELEASE_TABLET | Freq: Two times a day (BID) | ORAL | Status: DC
  Administered 2014-12-31 – 2015-01-05 (×9): 81 mg via ORAL
  Filled 2014-12-31 (×10): qty 1

## 2014-12-31 MED ORDER — ALPRAZOLAM 0.25 MG PO TABS: 0.2500 mg | ORAL_TABLET | Freq: Two times a day (BID) | ORAL | Status: DC | PRN

## 2014-12-31 MED ORDER — ALBUTEROL SULFATE (2.5 MG/3ML) 0.083% IN NEBU
2.5000 mg | INHALATION_SOLUTION | Freq: Four times a day (QID) | RESPIRATORY_TRACT | Status: DC
  Administered 2014-12-31 – 2015-01-02 (×6): 2.5 mg via RESPIRATORY_TRACT
  Filled 2014-12-31 (×6): qty 3

## 2014-12-31 MED ORDER — NITROGLYCERIN 0.4 MG SL SUBL: 0.4000 mg | SUBLINGUAL_TABLET | SUBLINGUAL | Status: DC | PRN

## 2014-12-31 MED ORDER — ATORVASTATIN CALCIUM 10 MG PO TABS
10.0000 mg | ORAL_TABLET | Freq: Every day | ORAL | Status: DC
  Administered 2014-12-31 – 2015-01-04 (×5): 10 mg via ORAL
  Filled 2014-12-31 (×5): qty 1

## 2014-12-31 MED ORDER — TRAZODONE HCL 50 MG PO TABS
75.0000 mg | ORAL_TABLET | Freq: Every day | ORAL | Status: DC
  Administered 2014-12-31 – 2015-01-04 (×5): 75 mg via ORAL
  Filled 2014-12-31 (×5): qty 2

## 2014-12-31 MED ORDER — FLUTICASONE PROPIONATE 50 MCG/ACT NA SUSP
2.0000 | Freq: Two times a day (BID) | NASAL | Status: DC
  Administered 2014-12-31 – 2015-01-05 (×10): 2 via NASAL
  Filled 2014-12-31: qty 16

## 2014-12-31 MED ORDER — INSULIN ASPART 100 UNIT/ML ~~LOC~~ SOLN
0.0000 [IU] | Freq: Three times a day (TID) | SUBCUTANEOUS | Status: DC
  Administered 2015-01-01: 5 [IU] via SUBCUTANEOUS
  Administered 2015-01-01: 15 [IU] via SUBCUTANEOUS
  Administered 2015-01-01: 5 [IU] via SUBCUTANEOUS
  Administered 2015-01-02: 8 [IU] via SUBCUTANEOUS
  Administered 2015-01-02: 11 [IU] via SUBCUTANEOUS
  Administered 2015-01-02 – 2015-01-03 (×2): 8 [IU] via SUBCUTANEOUS
  Administered 2015-01-03 – 2015-01-04 (×3): 5 [IU] via SUBCUTANEOUS

## 2014-12-31 MED ORDER — FAMOTIDINE 20 MG PO TABS
20.0000 mg | ORAL_TABLET | Freq: Every day | ORAL | Status: DC
  Administered 2014-12-31 – 2015-01-04 (×5): 20 mg via ORAL
  Filled 2014-12-31 (×5): qty 1

## 2014-12-31 MED ORDER — ONDANSETRON HCL 4 MG/2ML IJ SOLN
4.0000 mg | Freq: Four times a day (QID) | INTRAMUSCULAR | Status: DC | PRN
Start: 2014-12-31 — End: 2015-01-05

## 2014-12-31 MED ORDER — IOHEXOL 350 MG/ML SOLN
80.0000 mL | Freq: Once | INTRAVENOUS | Status: AC | PRN
  Administered 2014-12-31: 100 mL via INTRAVENOUS

## 2014-12-31 MED ORDER — NITROGLYCERIN IN D5W 200-5 MCG/ML-% IV SOLN
10.0000 ug/min | INTRAVENOUS | Status: DC
Start: 2014-12-31 — End: 2015-01-03
  Administered 2014-12-31: 5 ug/min via INTRAVENOUS
  Filled 2014-12-31: qty 250

## 2014-12-31 MED ORDER — ROPINIROLE HCL 1 MG PO TABS
1.0000 mg | ORAL_TABLET | Freq: Every day | ORAL | Status: DC
  Administered 2014-12-31 – 2015-01-04 (×5): 1 mg via ORAL
  Filled 2014-12-31 (×5): qty 1

## 2014-12-31 MED ORDER — SERTRALINE HCL 50 MG PO TABS
50.0000 mg | ORAL_TABLET | Freq: Every day | ORAL | Status: DC
  Administered 2015-01-01 – 2015-01-05 (×5): 50 mg via ORAL
  Filled 2014-12-31 (×5): qty 1

## 2014-12-31 MED ORDER — SODIUM CHLORIDE 0.9 % IV SOLN
INTRAVENOUS | Status: DC
  Administered 2014-12-31 – 2015-01-01 (×2): via INTRAVENOUS

## 2014-12-31 NOTE — Progress Notes (Signed)
ANTICOAGULATION CONSULT NOTE - Initial Consult  Pharmacy Consult for Heparin  Indication: chest pain/ACS  Allergies  Allergen Reactions  . Ambien [Zolpidem Tartrate] Other (See Comments)    Severe hallucinations and behavior changes reported by family  . Ciprofloxacin     PATIENT AT VERY HIGH RISK FOR C DIFFICILE COLITIS  . Actos [Pioglitazone] Swelling  . Bydureon [Exenatide]     Stomach pain  . Lunesta [Eszopiclone] Other (See Comments)    Nightmares & hallucinations  . Protonix [Pantoprazole Sodium] Other (See Comments)    Unknown reaction-"Doctor told me to never take it"    Patient Measurements: Height: 5\' 6"  (167.6 cm) Weight: 212 lb (96.163 kg) IBW/kg (Calculated) : 59.3 Heparin Dosing Weight: 66 kg   Vital Signs: Temp: 99.2 F (37.3 C) (10/28 1626) BP: 129/64 mmHg (10/28 1626) Pulse Rate: 74 (10/28 1626)  Labs:  Recent Labs  12/31/14 1640  HGB 10.8*  HCT 35.1*  PLT 246    CrCl cannot be calculated (Patient has no serum creatinine result on file.).   Medical History: Past Medical History  Diagnosis Date  . COPD (chronic obstructive pulmonary disease) (HCC)   . Chronic respiratory failure (HCC)   . Morbid obesity (HCC)   . Tobacco abuse   . GERD (gastroesophageal reflux disease)   . Hyperlipidemia   . Chronic pain   . Hypertension   . Coronary vasospasm (HCC)     a. 03/2004 NSTEMI/Cath: EF 45%, LM nl, LAD nl, D1 nl, D2 nl, LCX nl, OM1 large-nl, RCA-spasm noted, otw nl.  . Arthritis   . Allergic rhinitis   . Diabetes mellitus type 2 in obese (HCC) 02/29/2012  . HTN (hypertension) 02/29/2012  . 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 recurrent in setting of abx noncompliance.  . Chronic kidney disease 09/2012    acute renal failure  . Shock (HCC) 09/2012  . H1N1 influenza   . Shortness of breath dyspnea   . CHF (congestive heart failure) (HCC)     Medications:   (Not in a hospital admission)  Assessment: 58 YOF who presented with chest pain. MD  concerned for ustable angina. Pharmacy consulted to start IV heparin for ACS. H/H low, Plt wnl. Patient is not on any anticoagulants prior to admission   Goal of Therapy:  Heparin level 0.3-0.7 units/ml Monitor platelets by anticoagulation protocol: Yes   Plan:  -Give IV heparin 4000 units bolus followed by heparin infusion at 900 units/hr -F/u 6 hr HL -Monitor daily HL, CBC and s/s of bleeding  Vinnie Level, PharmD., BCPS Clinical Pharmacist Phone [phone]

## 2014-12-31 NOTE — H&P (Addendum)
History and Physical   Patient ID: [name] MRN: [id number], DOB/AGE: 1955-07-08 59 y.o. Date of Encounter: 12/31/2014  Primary Physician: [name], NP Primary Cardiologist: Dr Ladona Ridgel   Chief Complaint:  Chest pain  HPI: [name] is a 59 y.o. 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   Pt had onset of back pain with mild exertion at home about noon. She went into the kitchen, and her chest started hurting under the L breast. No radiation. It was 8/10, and got some worse with inspiration. No cough. She felt a little SOB, no N&V, some diaphoresis. She took SL NTG x 1 at home and got 1 by EMS. The NTG helped the pain a little. She got morphine x 2 and is currently pain-free.   The pain has recurred. Nitro is being started. Morphine given earlier helped the pain, but caused itching, so it will be discontinued. She is on chronic O2 at 4 lpm at home, but is able to use a home treadmill for 10 minutes at a time. She does not get chest pain with the treadmill.   Past Medical History  Diagnosis Date  . COPD (chronic obstructive pulmonary disease) (HCC)   . Chronic respiratory failure (HCC)   . Morbid obesity (HCC)   . Tobacco abuse   . GERD (gastroesophageal reflux disease)   . Hyperlipidemia   . Chronic pain   . Hypertension   . Coronary vasospasm (HCC)     a. 03/2004 NSTEMI/Cath: EF 45%, LM nl, LAD nl, D1 nl, D2 nl, LCX nl, OM1 large-nl, RCA-spasm noted, otw nl.  . Arthritis   . Allergic rhinitis   . Diabetes mellitus type 2 in obese (HCC) 02/29/2012  . HTN (hypertension) 02/29/2012  . 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 recurrent in setting of abx noncompliance.  . Chronic kidney disease 09/2012    acute renal failure  . Shock (HCC) 09/2012  . H1N1 influenza   . Shortness of breath dyspnea   . CHF (congestive heart failure) [facility])     Surgical History:  Past Surgical History   Procedure Laterality Date  . Cholecystectomy    . Partial hysterectomy    . Angioplasty    . Umbilical hernia repair  12/2009  . Permanent pacemaker insertion N/A 10/16/2012    Procedure: PERMANENT PACEMAKER INSERTION;  Surgeon: Marinus Maw, MD;  [employer]     I have reviewed the patient's current medications. Medication Sig  albuterol (PROVENTIL HFA;VENTOLIN HFA) 108 (90 BASE) MCG/ACT inhaler Inhale 2 puffs into the lungs every 6 (six) hours as needed for wheezing or shortness of breath.  albuterol (PROVENTIL) (2.5 MG/3ML) 0.083% nebulizer solution Take 2.5 mg by nebulization every 6 (six) hours.  aspirin EC 81 MG tablet Take 81 mg by mouth 2 (two) times daily.  atorvastatin (LIPITOR) 10 MG tablet Take 10 mg by mouth at bedtime.  budesonide-formoterol (SYMBICORT) 160-4.5 MCG/ACT inhaler Inhale 2 puffs into the lungs 2 (two) times daily.  digoxin (LANOXIN) 0.25 MG tablet Take 0.25 mg by mouth daily.  diltiazem (TIAZAC) 300 MG 24 hr capsule Take 300 mg by mouth daily.   famotidine (PEPCID) 20 MG tablet Take 1 tablet (20 mg total) by mouth at bedtime.  fluticasone (FLONASE) 50 MCG/ACT nasal spray Place 2 sprays into both nostrils 2 (two) times daily.   furosemide (LASIX) 40 MG  tablet Take 40 mg by mouth 2 (two) times daily.   HYDROcodone-acetaminophen (NORCO) 10-325 MG per tablet Take 1 tablet by mouth 3 (three) times daily.   insulin aspart (NOVOLOG) 100 UNIT/ML injection Inject 3-8 Units into the skin 3 (three) times daily before meals. 150-200=3 units      anything >300=8 units  lisinopril (PRINIVIL,ZESTRIL) 20 MG tablet Take 20 mg by mouth at bedtime.   metFORMIN (GLUCOPHAGE) 1000 MG tablet Take 1,000 mg by mouth 2 (two) times daily with a meal.  methocarbamol (ROBAXIN) 750 MG tablet Take 750 mg by mouth 3 (three) times daily.   OXYGEN Inhale 4 L into the lungs continuous.  rOPINIRole (REQUIP) 1 MG tablet Take 1 mg by mouth at bedtime.  sertraline (ZOLOFT) 50 MG tablet Take  50 mg by mouth daily.  traZODone (DESYREL) 50 MG tablet Take 75 mg by mouth at bedtime.    Scheduled Meds: . albuterol  2.5 mg Nebulization Q6H  . aspirin EC  81 mg Oral BID  . atorvastatin  10 mg Oral QHS  . budesonide-formoterol  2 puff Inhalation BID  . famotidine  20 mg Oral QHS  . fluticasone  2 spray Each Nare BID  . HYDROcodone-acetaminophen  1 tablet Oral TID  . rOPINIRole  1 mg Oral QHS  . sertraline  50 mg Oral Daily  . traZODone  75 mg Oral QHS   Continuous Infusions: . nitroGLYCERIN Stopped (12/31/14 1824)   Allergies:  Allergies  Allergen Reactions  . Ambien [Zolpidem Tartrate] Other (See Comments)    Severe hallucinations and behavior changes reported by family  . Ciprofloxacin     PATIENT AT VERY HIGH RISK FOR C DIFFICILE COLITIS  . Actos [Pioglitazone] Swelling  . Bydureon [Exenatide]     Stomach pain  . Lunesta [Eszopiclone] Other (See Comments)    Nightmares & hallucinations  . Morphine And Related Itching  . Protonix [Pantoprazole Sodium] Other (See Comments)    Unknown reaction-"Doctor told me to never take it"   Social History   Social History  . Marital Status: Married    Spouse Name: N/A  . Number of Children: 1  . Years of Education: N/A   Occupational History  . Environmental education officer   Social History Main Topics  . Smoking status: Former Smoker -- 2.00 packs/day for 40 years    Types: Cigarettes    Quit date: 03/04/2012  . Smokeless tobacco: Never Used  . Alcohol Use: No  . Drug Use: No  . Sexual Activity: Not on file   Other Topics Concern  . Not on file   Social History Narrative   Lives in [place] with her husband.  She does not routinely exercise.    Family History  Problem Relation Age of Onset  . Other Mother     borderline diabetic  . COPD Mother   . Heart Problems Father   . Cancer Father     lung  . Heart Problems Maternal Aunt   . Heart Problems Maternal Uncle   . Heart Problems Maternal Grandmother   .  Heart failure Maternal Grandmother   . Heart Problems Maternal Grandfather   . Heart Problems Paternal Grandmother   . Heart Problems Paternal Grandfather   . Heart attack Daughter 11   Family Status  Relation Status Death Age  . Mother Deceased 70    COPD  . Father Deceased     lung cancer  . Maternal Aunt Deceased   .  Maternal Uncle Deceased   . Maternal Grandmother Deceased   . Maternal Grandfather Deceased   . Paternal Grandmother Deceased   . Paternal Grandfather Deceased   . Daughter Deceased     Review of Systems:   Full 14-point review of systems otherwise negative except as noted above.  Physical Exam: Blood pressure 129/64, pulse 74, temperature 99.2 F (37.3 C), resp. rate 20, height 5\' 6"  (1.676 m), weight 212 lb (96.163 kg), SpO2 99 %. General: Well developed, well nourished,female in no acute distress. Head: Normocephalic, atraumatic, sclera non-icteric, no xanthomas, nares are without discharge. Dentition: good Neck: No carotid bruits. JVD not elevated. No thyromegally Lungs: Good expansion bilaterally. without wheezes or rhonchi. Chest wall is tender at L lower rib edge. Heart: Regular rate and rhythm with S1 S2.  No S3 or S4.  Soft murmur, no rubs, or gallops appreciated. Heart sounds are distant. Abdomen: Soft, non-tender, non-distended with normoactive bowel sounds. No hepatomegaly. No rebound/guarding. No obvious abdominal masses. Msk:  Strength and tone appear normal for age. No joint deformities or effusions, no spine or costo-vertebral angle tenderness. Extremities: No clubbing or cyanosis. No edema.  Distal pedal pulses are 2+ in 4 extrem Neuro: Alert and oriented X 3. Moves all extremities spontaneously. No focal deficits noted. Psych:  Responds to questions appropriately with a normal affect. Skin: No rashes or lesions noted  Labs:   Lab Results  Component Value Date   WBC 9.0 12/31/2014   HGB 10.8* 12/31/2014   HCT 35.1* 12/31/2014   MCV 90.9  12/31/2014   PLT 246 12/31/2014     Recent Labs Lab 12/31/14 1640  NA 132*  K 4.6  CL 87*  CO2 33*  BUN 27*  CREATININE 1.45*  CALCIUM 8.7*  PROT 6.3*  BILITOT 1.0  ALKPHOS 49  ALT 34  AST 41  GLUCOSE 380*    Recent Labs  12/31/14 1631  TROPIPOC 0.02   Radiology/Studies: Dg Chest Port 1 View  12/31/2014  CLINICAL DATA:  Chest pain EXAM: PORTABLE CHEST 1 VIEW COMPARISON:  08/23/2014 FINDINGS: Moderate cardiomegaly. Dual lead left subclavian pacemaker device and leads are stable and intact. Lungs are clear. Pulmonary vascularity is within normal limits. Vascular congestion previously noted has resolved. IMPRESSION: Cardiomegaly without decompensation. Electronically Signed   By: Jolaine Click M.D.   On: 12/31/2014 16:43     Cardiac Cath: 2006 Normal cors, RCA spasm with catheter  Echo: 2014 EF normal with no effusion  ECG: 12/31/2014 SR, occ A pacing, RBBB & LAFB, No change  ASSESSMENT AND PLAN:  Principal Problem:   Chest pain with high risk for cardiac etiology  - admit, cycle enzymes, if CT neg for dissection, start heparin - MV in August was abnormal, MD advise on further testing  Active Problems:   Hyperlipidemia - continue Lipitor, ck profile and LFTs    Diabetes mellitus type 2 in obese (HCC) - hold metformin, SSI    Hypertension - hold ACE and CCB, follow - BP currently lower than normal    Hypercapnic respiratory failure, chronic (HCC) - on 4 lpm O2 at home, continue this.   [name] 12/31/2014 6:24 PM Beeper [phone]  The patient was seen, examined and discussed with Theodore Demark, PA-C and I agree with the above.   A 59 y.o. 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 She was admitted in  She presented  with back pain that progressed to pressure like chest pain radiating to her left arm, the pain was so severe that she went to the ground and got  some worse with inspiration. No cough. She felt a little SOB, no N&V, some diaphoresis. She took SL NTG x 1 at home and got 1 by EMS. The NTG helped the pain a little. She got morphine x 2 and is currently pain-free. The pain has recurred. Nitro and heparin were started for unstable angina. CTA done in the ER showed no PE, dissection and no pulmonary edema and diffuse coronary calcifications.   We will admit for unstable angina, repeat echocardiogram since the last in 2014 showed normal LVEF and nuclear stress test in August showed LVEF 44% with regional wall motion abnormalities. ECG is unchanged, the first troponin 0.02, I would consider a cath on Monday if Crea ok post CTA today.  Continue ASA, atorvastatin, heparin, no ACEI/ARB or BB as she is rather hypotensive and baseline HR in 60'.  Lars Masson 12/31/2014

## 2014-12-31 NOTE — ED Notes (Signed)
Attempted report 

## 2014-12-31 NOTE — ED Provider Notes (Signed)
CSN: [id number]     Arrival date & time 12/31/14  1600 History   First MD Initiated Contact with Patient 12/31/14 1604     Chief Complaint  Patient presents with  . Chest Pain     (Consider location/radiation/quality/duration/timing/severity/associated sxs/prior Treatment) The history is provided by the patient.  [name] is a 59 y.o. female hx COPD, obesity, cardiac arrest, bradycardia with pacemaker here presenting with chest pain. States that she has chronic chest pain and back pain. However today, patient was sitting in her couch and has sudden onset of worsening sharp left sided chest pain under her breast. Pain is constant and worse with exertion. Denies any shortness of breath but does have baseline shortness of breath from COPD and uses oxygen at all times. Given ASA 324 mg by EMS and 3 nitros. Still has 4/10 pain.    Past Medical History  Diagnosis Date  . COPD (chronic obstructive pulmonary disease) (HCC)   . Chronic respiratory failure (HCC)   . Morbid obesity (HCC)   . Tobacco abuse   . GERD (gastroesophageal reflux disease)   . Hyperlipidemia   . Chronic pain   . Hypertension   . Coronary vasospasm (HCC)     a. 03/2004 NSTEMI/Cath: EF 45%, LM nl, LAD nl, D1 nl, D2 nl, LCX nl, OM1 large-nl, RCA-spasm noted, otw nl.  . Arthritis   . Allergic rhinitis   . Diabetes mellitus type 2 in obese (HCC) 02/29/2012  . HTN (hypertension) 02/29/2012  . 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 recurrent in setting of abx noncompliance.  . Chronic kidney disease 09/2012    acute renal failure  . Shock (HCC) 09/2012  . H1N1 influenza   . Shortness of breath dyspnea   . CHF (congestive heart failure) [facility])    Past Surgical History  Procedure Laterality Date  . Cholecystectomy    . Partial hysterectomy    . Angioplasty    . Umbilical hernia repair  12/2009  . Permanent pacemaker insertion N/A 10/16/2012    Procedure: PERMANENT PACEMAKER INSERTION;  Surgeon: Marinus Maw, MD;   [employer]   Family History  Problem Relation Age of Onset  . Other Mother     borderline diabetic  . COPD Mother   . Heart Problems Father   . Cancer Father     lung  . Heart Problems Maternal Aunt   . Heart Problems Maternal Uncle   . Heart Problems Maternal Grandmother   . Heart failure Maternal Grandmother   . Heart Problems Maternal Grandfather   . Heart Problems Paternal Grandmother   . Heart Problems Paternal Grandfather   . Heart attack Daughter 53   Social History  Substance Use Topics  . Smoking status: Former Smoker -- 2.00 packs/day for 40 years    Types: Cigarettes    Quit date: 03/04/2012  . Smokeless tobacco: Never Used  . Alcohol Use: No   OB History    No data available     Review of Systems  Cardiovascular: Positive for chest pain.  All other systems reviewed and are negative.     Allergies  Ambien; Ciprofloxacin; Actos; Bydureon; Lunesta; Morphine and related; and Protonix  Home Medications   Prior to Admission medications   Medication Sig Start Date End Date Taking? Authorizing Provider  albuterol (PROVENTIL HFA;VENTOLIN HFA) 108 (90 BASE) MCG/ACT inhaler Inhale 2 puffs into the lungs every 6 (six) hours as needed for wheezing or shortness of breath.  Yes Historical Provider, MD  albuterol (PROVENTIL) (2.5 MG/3ML) 0.083% nebulizer solution Take 2.5 mg by nebulization every 6 (six) hours.   Yes Historical Provider, MD  aspirin EC 81 MG tablet Take 81 mg by mouth 2 (two) times daily.   Yes Historical Provider, MD  atorvastatin (LIPITOR) 10 MG tablet Take 10 mg by mouth at bedtime.   Yes Historical Provider, MD  budesonide-formoterol (SYMBICORT) 160-4.5 MCG/ACT inhaler Inhale 2 puffs into the lungs 2 (two) times daily.   Yes Historical Provider, MD  digoxin (LANOXIN) 0.25 MG tablet Take 0.25 mg by mouth daily.   Yes Historical Provider, MD  diltiazem (TIAZAC) 300 MG 24 hr capsule Take 300 mg by mouth daily.    Yes Historical Provider, MD   famotidine (PEPCID) 20 MG tablet Take 1 tablet (20 mg total) by mouth at bedtime. 09/16/12  Yes Vassie Loll, MD  fluticasone [facility]) 50 MCG/ACT nasal spray Place 2 sprays into both nostrils 2 (two) times daily.    Yes Historical Provider, MD  furosemide (LASIX) 40 MG tablet Take 40 mg by mouth 2 (two) times daily.  11/27/12  Yes Historical Provider, MD  HYDROcodone-acetaminophen (NORCO) 10-325 MG per tablet Take 1 tablet by mouth 3 (three) times daily.  10/06/12  Yes Historical Provider, MD  insulin aspart (NOVOLOG) 100 UNIT/ML injection Inject 3-8 Units into the skin 3 (three) times daily before meals. 150-200=3 units      anything >300=8 units   Yes Historical Provider, MD  lisinopril (PRINIVIL,ZESTRIL) 20 MG tablet Take 20 mg by mouth at bedtime.    Yes Historical Provider, MD  metFORMIN (GLUCOPHAGE) 1000 MG tablet Take 1,000 mg by mouth 2 (two) times daily with a meal.   Yes Historical Provider, MD  methocarbamol (ROBAXIN) 750 MG tablet Take 750 mg by mouth 3 (three) times daily.    Yes Historical Provider, MD  OXYGEN Inhale 4 L into the lungs continuous.   Yes Historical Provider, MD  rOPINIRole (REQUIP) 1 MG tablet Take 1 mg by mouth at bedtime.   Yes Historical Provider, MD  sertraline (ZOLOFT) 50 MG tablet Take 50 mg by mouth daily.   Yes Historical Provider, MD  traZODone (DESYREL) 50 MG tablet Take 75 mg by mouth at bedtime.    Yes Historical Provider, MD   BP 129/64 mmHg  Pulse 74  Temp(Src) 99.2 F (37.3 C)  Resp 20  Ht  (1.676 m)  Wt 212 lb (96.163 kg)  BMI 34.23 kg/m2  SpO2 99% Physical Exam  Constitutional: She is oriented to person, place, and time.  Chronically ill, uncomfortable   HENT:  Head: Normocephalic.  Mouth/Throat: Oropharynx is clear and moist.  Eyes: Conjunctivae are normal. Pupils are equal, round, and reactive to light.  Neck: Normal range of motion. Neck supple.  Cardiovascular: Normal rate, regular rhythm and normal heart sounds.    Pulmonary/Chest: Effort normal and breath sounds normal. No respiratory distress. She has no wheezes.  Mild L sided chest tenderness, lungs clear   Abdominal: Soft. Bowel sounds are normal. She exhibits no distension. There is no tenderness. There is no rebound.  Musculoskeletal: Normal range of motion.  Neurological: She is alert and oriented to person, place, and time.  Skin: Skin is warm and dry.  Psychiatric: She has a normal mood and affect. Her behavior is normal. Judgment and thought content normal.  Nursing note and vitals reviewed.   ED Course  Procedures (including critical care time)  CRITICAL CARE Performed by: Chaney Malling  Total critical care time: 30 minutes  Critical care time was exclusive of separately billable procedures and treating other patients.  Critical care was necessary to treat or prevent imminent or life-threatening deterioration.  Critical care was time spent personally by me on the following activities: development of treatment plan with patient and/or surrogate as well as nursing, discussions with consultants, evaluation of patient's response to treatment, examination of patient, obtaining history from patient or surrogate, ordering and performing treatments and interventions, ordering and review of laboratory studies, ordering and review of radiographic studies, pulse oximetry and re-evaluation of patient's condition.   Labs Review Labs Reviewed  CBC WITH DIFFERENTIAL/PLATELET - Abnormal; Notable for the following:    RBC 3.86 (*)    Hemoglobin 10.8 (*)    HCT 35.1 (*)    All other components within normal limits  COMPREHENSIVE METABOLIC PANEL - Abnormal; Notable for the following:    Sodium 132 (*)    Chloride 87 (*)    CO2 33 (*)    Glucose, Bld 380 (*)    BUN 27 (*)    Creatinine, Ser 1.45 (*)    Calcium 8.7 (*)    Total Protein 6.3 (*)    GFR calc non Af Amer 38 (*)    GFR calc Af Amer 44 (*)    All other components within normal  limits  DIGOXIN LEVEL  HEPARIN LEVEL (UNFRACTIONATED)  CBC  I-STAT TROPOININ, ED    Imaging Review Dg Chest Port 1 View  12/31/2014  CLINICAL DATA:  Chest pain EXAM: PORTABLE CHEST 1 VIEW COMPARISON:  08/23/2014 FINDINGS: Moderate cardiomegaly. Dual lead left subclavian pacemaker device and leads are stable and intact. Lungs are clear. Pulmonary vascularity is within normal limits. Vascular congestion previously noted has resolved. IMPRESSION: Cardiomegaly without decompensation. Electronically Signed   By: Jolaine Click M.D.   On: 12/31/2014 16:43   I have personally reviewed and evaluated these images and lab results as part of my medical decision-making.   EKG Interpretation   Date/Time:  Friday December 31 2014 16:04:48 EDT Ventricular Rate:  67 PR Interval:  183 QRS Duration: 171 QT Interval:  413 QTC Calculation: 436 R Axis:   -75 Text Interpretation:  Sinus rhythm RBBB and LAFB Left ventricular  hypertrophy Baseline wander in lead(s) I II aVR aVF No significant change  since last tracing Confirmed by YAO  MD, DAVID ([id number]) on 12/31/2014  4:24:28 PM      MDM   Final diagnoses:  Chest pain at rest    CAMORA TREMAIN is a 59 y.o. female here with chest pain. Concerned for unstable angina. Also consider COPD vs MSK. Given extensive cardiac risk factors, will start nitro drip and heparin drip and will consult cardiology.   6:18 PM Cardiology saw patient. CXR has cardiomegaly. Bedside US showed no effusion. Cardiology wants to do dissection study but I have low suspicion for dissection. Will not start heparin drip. BP low 90s now and pain free so will turn down nitro drip. Cardiology plans to admit.    Richardean Canal, MD 12/31/14 Zollie Pee

## 2014-12-31 NOTE — ED Notes (Signed)
Pt here from home with c/o chest pain that radiates to the back , pt received 324mg  asa 3 nitro and 6mg  morphine from ems pt is on 4 liters O2 atc

## 2014-12-31 NOTE — ED Notes (Signed)
Pt st's she has had a productive cough for x's 2 months.  Pt st's pain in chest started earlier today.  Points to LUQ under left breast.  Pt also st's pain started in left upper back then moved down

## 2015-01-01 ENCOUNTER — Inpatient Hospital Stay (HOSPITAL_COMMUNITY): Payer: Medicare HMO

## 2015-01-01 DIAGNOSIS — R079 Chest pain, unspecified: Secondary | ICD-10-CM

## 2015-01-01 LAB — HEPARIN LEVEL (UNFRACTIONATED)
Heparin Unfractionated: 0.22 IU/mL — ABNORMAL LOW (ref 0.30–0.70)
Heparin Unfractionated: 0.2 IU/mL — ABNORMAL LOW (ref 0.30–0.70)
Heparin Unfractionated: 0.22 IU/mL — ABNORMAL LOW (ref 0.30–0.70)

## 2015-01-01 LAB — BASIC METABOLIC PANEL
ANION GAP: 9 (ref 5–15)
BUN: 22 mg/dL — ABNORMAL HIGH (ref 6–20)
CALCIUM: 8.4 mg/dL — AB (ref 8.9–10.3)
CO2: 34 mmol/L — ABNORMAL HIGH (ref 22–32)
Chloride: 95 mmol/L — ABNORMAL LOW (ref 101–111)
Creatinine, Ser: 1.11 mg/dL — ABNORMAL HIGH (ref 0.44–1.00)
GFR, EST NON AFRICAN AMERICAN: 53 mL/min — AB (ref >60)
GLUCOSE: 244 mg/dL — AB (ref 65–99)
Potassium: 3.9 mmol/L (ref 3.5–5.1)
Sodium: 138 mmol/L (ref 135–145)

## 2015-01-01 LAB — CBC
HCT: 32.6 % — ABNORMAL LOW (ref 36.0–46.0)
HEMOGLOBIN: 10.1 g/dL — AB (ref 12.0–15.0)
MCH: 28.1 pg (ref 26.0–34.0)
MCHC: 31 g/dL (ref 30.0–36.0)
MCV: 90.8 fL (ref 78.0–100.0)
Platelets: 195 10*3/uL (ref 150–400)
RBC: 3.59 MIL/uL — ABNORMAL LOW (ref 3.87–5.11)
RDW: 14.2 % (ref 11.5–15.5)
WBC: 7.7 10*3/uL (ref 4.0–10.5)

## 2015-01-01 LAB — GLUCOSE, CAPILLARY
Glucose-Capillary: 249 mg/dL — ABNORMAL HIGH (ref 65–99)
Glucose-Capillary: 373 mg/dL — ABNORMAL HIGH (ref 65–99)
Glucose-Capillary: 250 mg/dL — ABNORMAL HIGH (ref 65–99)
Glucose-Capillary: 291 mg/dL — ABNORMAL HIGH (ref 65–99)

## 2015-01-01 LAB — TROPONIN I: TROPONIN I: 0.03 ng/mL (ref <0.031)

## 2015-01-01 MED ORDER — HEPARIN BOLUS VIA INFUSION
1000.0000 [IU] | Freq: Once | INTRAVENOUS | Status: AC
  Administered 2015-01-01: 1000 [IU] via INTRAVENOUS
  Filled 2015-01-01: qty 1000

## 2015-01-01 MED ORDER — HEPARIN BOLUS VIA INFUSION
1150.0000 [IU] | Freq: Once | INTRAVENOUS | Status: AC
  Administered 2015-01-01: 1150 [IU] via INTRAVENOUS
  Filled 2015-01-01: qty 1150

## 2015-01-01 MED ORDER — HEPARIN BOLUS VIA INFUSION
4000.0000 [IU] | Freq: Once | INTRAVENOUS | Status: AC
  Administered 2015-01-01: 4000 [IU] via INTRAVENOUS
  Filled 2015-01-01: qty 4000

## 2015-01-01 MED ORDER — HEPARIN (PORCINE) IN NACL 100-0.45 UNIT/ML-% IJ SOLN
1500.0000 [IU]/h | INTRAMUSCULAR | Status: DC
  Administered 2015-01-01: 1300 [IU]/h via INTRAVENOUS
  Administered 2015-01-01: 1000 [IU]/h via INTRAVENOUS
  Administered 2015-01-02 (×2): 1500 [IU]/h via INTRAVENOUS
  Filled 2015-01-01 (×4): qty 250

## 2015-01-01 NOTE — Progress Notes (Signed)
Spoke to [place] in pharmacy about Heparin labs still in place but drip has been d/c. She will clarify and notify me.

## 2015-01-01 NOTE — Progress Notes (Addendum)
Patient requesting that she have her evening dose of lasix. Notified Dr. Sullivan Lone.   33- Spoke with Dr. Sullivan Lone, holding lasix d/t renal function. Explained this to patient.

## 2015-01-01 NOTE — Progress Notes (Signed)
ANTICOAGULATION CONSULT NOTE - Follow-Up Consult  Pharmacy Consult for Heparin  Indication: chest pain/ACS  Allergies  Allergen Reactions  . Ambien [Zolpidem Tartrate] Other (See Comments)    Severe hallucinations and behavior changes reported by family  . Ciprofloxacin     PATIENT AT VERY HIGH RISK FOR C DIFFICILE COLITIS  . Actos [Pioglitazone] Swelling  . Bydureon [Exenatide]     Stomach pain  . Lunesta [Eszopiclone] Other (See Comments)    Nightmares & hallucinations  . Morphine And Related Itching  . Protonix [Pantoprazole Sodium] Other (See Comments)    Unknown reaction-"Doctor told me to never take it"    Patient Measurements: Height: 5\' 4"  (162.6 cm) Weight: 218 lb 11.2 oz (99.202 kg) IBW/kg (Calculated) : 54.7 Heparin Dosing Weight: 66 kg   Vital Signs: Temp: 97.2 F (36.2 C) (10/29 0400) Temp Source: Oral (10/29 0400) BP: 122/78 mmHg (10/29 0400) Pulse Rate: 59 (10/29 0400)  Labs:  Recent Labs  12/31/14 1640 12/31/14 2058 12/31/14 2205 01/01/15 0213  HGB 10.8*  --   --  10.1*  HCT 35.1*  --   --  32.6*  PLT 246  --   --  195  LABPROT  --   --  13.7  --   INR  --   --  1.03  --   CREATININE 1.45*  --   --  1.11*  TROPONINI  --  0.03  --  0.03    Estimated Creatinine Clearance: 61.7 mL/min (by C-G formula based on Cr of 1.11).   Medical History: Past Medical History  Diagnosis Date  . COPD (chronic obstructive pulmonary disease) (HCC)   . Chronic respiratory failure (HCC)   . Morbid obesity (HCC)   . Tobacco abuse   . GERD (gastroesophageal reflux disease)   . Hyperlipidemia   . Chronic pain   . Hypertension   . Coronary vasospasm (HCC)     a. 03/2004 NSTEMI/Cath: EF 45%, LM nl, LAD nl, D1 nl, D2 nl, LCX nl, OM1 large-nl, RCA-spasm noted, otw nl.  . Arthritis   . Allergic rhinitis   . Diabetes mellitus type 2 in obese (HCC) 02/29/2012  . HTN (hypertension) 02/29/2012  . 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 recurrent in setting of abx  noncompliance.  . Chronic kidney disease 09/2012    acute renal failure  . Shock (HCC) 09/2012  . H1N1 influenza   . Shortness of breath dyspnea   . CHF (congestive heart failure) (HCC)     Medications:  Prescriptions prior to admission  Medication Sig Dispense Refill Last Dose  . albuterol (PROVENTIL HFA;VENTOLIN HFA) 108 (90 BASE) MCG/ACT inhaler Inhale 2 puffs into the lungs every 6 (six) hours as needed for wheezing or shortness of breath.   unknown  . albuterol (PROVENTIL) (2.5 MG/3ML) 0.083% nebulizer solution Take 2.5 mg by nebulization every 6 (six) hours.   12/31/2014 at Unknown time  . aspirin EC 81 MG tablet Take 81 mg by mouth 2 (two) times daily.   12/31/2014 at Unknown time  . atorvastatin (LIPITOR) 10 MG tablet Take 10 mg by mouth at bedtime.   12/30/2014 at Unknown time  . budesonide-formoterol (SYMBICORT) 160-4.5 MCG/ACT inhaler Inhale 2 puffs into the lungs 2 (two) times daily.   12/31/2014 at Unknown time  . digoxin (LANOXIN) 0.25 MG tablet Take 0.25 mg by mouth daily.   12/31/2014 at Unknown time  . diltiazem (TIAZAC) 300 MG 24 hr capsule Take 300 mg by mouth daily.  12/31/2014 at Unknown time  . famotidine (PEPCID) 20 MG tablet Take 1 tablet (20 mg total) by mouth at bedtime. 30 tablet 1 12/30/2014 at Unknown time  . fluticasone (FLONASE) 50 MCG/ACT nasal spray Place 2 sprays into both nostrils 2 (two) times daily.    12/31/2014 at Unknown time  . furosemide (LASIX) 40 MG tablet Take 40 mg by mouth 2 (two) times daily.    12/31/2014 at Unknown time  . HYDROcodone-acetaminophen (NORCO) 10-325 MG per tablet Take 1 tablet by mouth 3 (three) times daily.    12/31/2014 at 800  . insulin aspart (NOVOLOG) 100 UNIT/ML injection Inject 3-8 Units into the skin 3 (three) times daily before meals. 150-200=3 units      anything >300=8 units   12/31/2014 at Unknown time  . lisinopril (PRINIVIL,ZESTRIL) 20 MG tablet Take 20 mg by mouth at bedtime.    12/30/2014 at Unknown time  .  metFORMIN (GLUCOPHAGE) 1000 MG tablet Take 1,000 mg by mouth 2 (two) times daily with a meal.   12/31/2014 at Unknown time  . methocarbamol (ROBAXIN) 750 MG tablet Take 750 mg by mouth 3 (three) times daily.    12/31/2014 at Unknown time  . OXYGEN Inhale 4 L into the lungs continuous.   12/31/2014 at Unknown time  . rOPINIRole (REQUIP) 1 MG tablet Take 1 mg by mouth at bedtime.   12/30/2014 at Unknown time  . sertraline (ZOLOFT) 50 MG tablet Take 50 mg by mouth daily.   12/31/2014 at Unknown time  . traZODone (DESYREL) 50 MG tablet Take 75 mg by mouth at bedtime.    12/30/2014 at Unknown time    Assessment: [name] who presented with chest pain. MD concerned for ustable angina. Pharmacy consulted to start IV heparin for ACS. Patient is not on any anticoagulants prior to admission.  Hgb 10.1, Plt wnl, no bleeding noted, HL 0.22 (subtherapeutic)  Goal of Therapy:  Heparin level 0.3-0.7 units/ml Monitor platelets by anticoagulation protocol: Yes   Plan:  -Give IV heparin 1150 units bolus followed by heparin infusion at 1150 units/hr -F/u 6 hr HL -Monitor daily HL, CBC and s/s of bleeding  Remi Haggard, PharmD Clinical Pharmacist- Resident Pager: [phone]

## 2015-01-01 NOTE — Progress Notes (Signed)
ANTICOAGULATION CONSULT NOTE - Follow-Up Consult  Pharmacy Consult for Heparin  Indication: chest pain/ACS  Allergies  Allergen Reactions  . Ambien [Zolpidem Tartrate] Other (See Comments)    Severe hallucinations and behavior changes reported by family  . Ciprofloxacin     PATIENT AT VERY HIGH RISK FOR C DIFFICILE COLITIS  . Actos [Pioglitazone] Swelling  . Bydureon [Exenatide]     Stomach pain  . Lunesta [Eszopiclone] Other (See Comments)    Nightmares & hallucinations  . Morphine And Related Itching  . Protonix [Pantoprazole Sodium] Other (See Comments)    Unknown reaction-"Doctor told me to never take it"    Patient Measurements: Height: 5\' 4"  (162.6 cm) Weight: 218 lb 11.2 oz (99.202 kg) IBW/kg (Calculated) : 54.7 Heparin Dosing Weight: 66 kg   Vital Signs:    Labs:  Recent Labs  12/31/14 1640 12/31/14 2058 12/31/14 2205 01/01/15 0213 01/01/15 0714 01/01/15 0715 01/01/15 1540  HGB 10.8*  --   --  10.1*  --   --   --   HCT 35.1*  --   --  32.6*  --   --   --   PLT 246  --   --  195  --   --   --   LABPROT  --   --  13.7  --   --   --   --   INR  --   --  1.03  --   --   --   --   HEPARINUNFRC  --   --   --   --  0.22*  --  0.20*  CREATININE 1.45*  --   --  1.11*  --   --   --   TROPONINI  --  0.03  --  0.03  --  <0.03  --     Estimated Creatinine Clearance: 61.7 mL/min (by C-G formula based on Cr of 1.11).  Assessment: 58 YOF who presented with chest pain. MD concerned for ustable angina. Pharmacy consulted to start IV heparin for ACS. Patient is not on any anticoagulants prior to admission.  Hgb 10.1, Plt wnl, no bleeding noted, HL 0.2 (subtherapeutic) on 1150 units/hr. No issues per RN  Goal of Therapy:  Heparin level 0.3-0.7 units/ml Monitor platelets by anticoagulation protocol: Yes   Plan:  -Give IV heparin 1000 units bolus followed by heparin infusion at 1300 units/hr -F/u 6 hr HL @ 2300 -Monitor daily HL, CBC and s/s of bleeding  Isaac Bliss, PharmD, BCPS Clinical Pharmacist Pager [phone] 01/01/2015 4:29 PM

## 2015-01-01 NOTE — Progress Notes (Signed)
  Echocardiogram 2D Echocardiogram has been performed.  [name] 01/01/2015, 3:23 PM

## 2015-01-01 NOTE — Progress Notes (Signed)
Subjective:  Still on oxygen.  Complains of occasional left-sided chest pain today.  Objective:  Vital Signs in the last 24 hours: BP 122/78 mmHg  Pulse 59  Temp(Src) 97.2 F (36.2 C) (Oral)  Resp 13  Ht 5\' 4"  (1.626 m)  Wt 99.202 kg (218 lb 11.2 oz)  BMI 37.52 kg/m2  SpO2 98%  Physical Exam: Obese female wearing oxygen sitting up in bed complaining of left-sided chest pain Lungs:  Reduced breath sounds Cardiac:  Regular rhythm with irregular beats, normal S1 and S2, no S3 Abdomen:  Soft, nontender, no masses Extremities:  No edema present  Intake/Output from previous day: 10/28 0701 - 10/29 0700 In: 1240 [P.O.:240; I.V.:1000] Out: 450 [Urine:450]  Weight Filed Weights   12/31/14 1626 12/31/14 2111 01/01/15 0400  Weight: 96.163 kg (212 lb) 99.247 kg (218 lb 12.8 oz) 99.202 kg (218 lb 11.2 oz)    Lab Results: Basic Metabolic Panel:  Recent Labs  92/42/68 1640 01/01/15 0213  NA 132* 138  K 4.6 3.9  CL 87* 95*  CO2 33* 34*  GLUCOSE 380* 244*  BUN 27* 22*  CREATININE 1.45* 1.11*   CBC:  Recent Labs  12/31/14 1640 01/01/15 0213  WBC 9.0 7.7  NEUTROABS 6.8  --   HGB 10.8* 10.1*  HCT 35.1* 32.6*  MCV 90.9 90.8  PLT 246 195   Cardiac Enzymes: Troponin (Point of Care Test)  Recent Labs  12/31/14 1631  TROPIPOC 0.02   Cardiac Panel (last 3 results)  Recent Labs  12/31/14 2058 01/01/15 0213  TROPONINI 0.03 0.03    Telemetry: Sinus rhythm with occasional PACs and occasional paced beats  Assessment/Plan:  1.  Prolonged chest discomfort in a patient with a previously abnormal myocardial perfusion scan 2.  Chronic respiratory failure 3.  Functioning permanent pacemaker 4.  History of paroxysmal atrial fibrillation  Recommendations:  She complained of some left-sided pain this morning.  Has bifascicular block on EKG.  Cardiac catheterization planned through a radial approach on Monday morning.  Cardiac catheterization was discussed with the  patient fully including risks of myocardial infarction, death, stroke, bleeding, arrhythmia, dye allergy, renal insufficiency or bleeding.  The patient understands and is willing to proceed.   Darden Palmer  MD [facility]  01/01/2015, 8:25 AM

## 2015-01-01 NOTE — Progress Notes (Signed)
Patient stated that she felt like her hands and feet were starting to swell.  Wilburt Finlay, PA notified, he stated to monitor I&O and weights.

## 2015-01-02 LAB — BASIC METABOLIC PANEL
ANION GAP: 8 (ref 5–15)
BUN: 11 mg/dL (ref 6–20)
CALCIUM: 8.4 mg/dL — AB (ref 8.9–10.3)
CO2: 29 mmol/L (ref 22–32)
CREATININE: 0.79 mg/dL (ref 0.44–1.00)
Chloride: 102 mmol/L (ref 101–111)
GFR calc Af Amer: >60 mL/min (ref >60)
GLUCOSE: 276 mg/dL — AB (ref 65–99)
Potassium: 4.5 mmol/L (ref 3.5–5.1)
Sodium: 139 mmol/L (ref 135–145)

## 2015-01-02 LAB — GLUCOSE, CAPILLARY
Glucose-Capillary: 251 mg/dL — ABNORMAL HIGH (ref 65–99)
Glucose-Capillary: 328 mg/dL — ABNORMAL HIGH (ref 65–99)
Glucose-Capillary: 259 mg/dL — ABNORMAL HIGH (ref 65–99)
Glucose-Capillary: 313 mg/dL — ABNORMAL HIGH (ref 65–99)

## 2015-01-02 LAB — CBC
HCT: 34.1 % — ABNORMAL LOW (ref 36.0–46.0)
Hemoglobin: 10.5 g/dL — ABNORMAL LOW (ref 12.0–15.0)
MCH: 28.8 pg (ref 26.0–34.0)
MCHC: 30.8 g/dL (ref 30.0–36.0)
MCV: 93.4 fL (ref 78.0–100.0)
PLATELETS: 169 10*3/uL (ref 150–400)
RBC: 3.65 MIL/uL — ABNORMAL LOW (ref 3.87–5.11)
RDW: 14.3 % (ref 11.5–15.5)
WBC: 5.8 10*3/uL (ref 4.0–10.5)

## 2015-01-02 LAB — HEPARIN LEVEL (UNFRACTIONATED)
Heparin Unfractionated: 0.56 IU/mL (ref 0.30–0.70)
Heparin Unfractionated: 0.45 IU/mL (ref 0.30–0.70)

## 2015-01-02 MED ORDER — ALBUTEROL SULFATE (2.5 MG/3ML) 0.083% IN NEBU
2.5000 mg | INHALATION_SOLUTION | Freq: Three times a day (TID) | RESPIRATORY_TRACT | Status: DC
  Administered 2015-01-02 – 2015-01-05 (×9): 2.5 mg via RESPIRATORY_TRACT
  Filled 2015-01-02 (×12): qty 3

## 2015-01-02 MED ORDER — ASPIRIN 81 MG PO CHEW
81.0000 mg | CHEWABLE_TABLET | ORAL | Status: AC
  Administered 2015-01-03: 81 mg via ORAL
  Filled 2015-01-02: qty 1

## 2015-01-02 MED ORDER — SODIUM CHLORIDE 0.9 % IJ SOLN: 3.0000 mL | INTRAMUSCULAR | Status: DC | PRN

## 2015-01-02 MED ORDER — SODIUM CHLORIDE 0.9 % IV SOLN: 250.0000 mL | INTRAVENOUS | Status: DC | PRN

## 2015-01-02 MED ORDER — SODIUM CHLORIDE 0.9 % IJ SOLN
3.0000 mL | Freq: Two times a day (BID) | INTRAMUSCULAR | Status: DC
  Administered 2015-01-02 – 2015-01-03 (×2): 3 mL via INTRAVENOUS

## 2015-01-02 MED ORDER — SODIUM CHLORIDE 0.9 % IV SOLN
INTRAVENOUS | Status: DC
  Administered 2015-01-02: 19:00:00 via INTRAVENOUS
  Administered 2015-01-02: 1000 mL via INTRAVENOUS

## 2015-01-02 MED ORDER — HEPARIN BOLUS VIA INFUSION
1500.0000 [IU] | Freq: Once | INTRAVENOUS | Status: AC
  Administered 2015-01-02: 1500 [IU] via INTRAVENOUS
  Filled 2015-01-02: qty 1500

## 2015-01-02 NOTE — Progress Notes (Signed)
ANTICOAGULATION CONSULT NOTE - Follow Up Consult  Pharmacy Consult for Heparin  Indication: chest pain/ACS  Allergies  Allergen Reactions  . Ambien [Zolpidem Tartrate] Other (See Comments)    Severe hallucinations and behavior changes reported by family  . Ciprofloxacin     PATIENT AT VERY HIGH RISK FOR C DIFFICILE COLITIS  . Actos [Pioglitazone] Swelling  . Bydureon [Exenatide]     Stomach pain  . Lunesta [Eszopiclone] Other (See Comments)    Nightmares & hallucinations  . Morphine And Related Itching  . Protonix [Pantoprazole Sodium] Other (See Comments)    Unknown reaction-"Doctor told me to never take it"    Patient Measurements: Height: 5\' 4"  (162.6 cm) Weight: 218 lb 11.2 oz (99.202 kg) IBW/kg (Calculated) : 54.7  Vital Signs: Temp: 98.3 F (36.8 C) (10/29 2010) Temp Source: Oral (10/29 2010) BP: 127/57 mmHg (10/29 2157) Pulse Rate: 68 (10/29 2157)  Labs:  Recent Labs  12/31/14 1640 12/31/14 2058 12/31/14 2205 01/01/15 0213 01/01/15 0714 01/01/15 0715 01/01/15 1540 01/01/15 2316  HGB 10.8*  --   --  10.1*  --   --   --   --   HCT 35.1*  --   --  32.6*  --   --   --   --   PLT 246  --   --  195  --   --   --   --   LABPROT  --   --  13.7  --   --   --   --   --   INR  --   --  1.03  --   --   --   --   --   HEPARINUNFRC  --   --   --   --  0.22*  --  0.20* 0.22*  CREATININE 1.45*  --   --  1.11*  --   --   --   --   TROPONINI  --  0.03  --  0.03  --  <0.03  --   --     Estimated Creatinine Clearance: 61.7 mL/min (by C-G formula based on Cr of 1.11).   Assessment: Sub-therapeutic heparin level despite rate increase, no issues per RN.   Goal of Therapy:  Heparin level 0.3-0.7 units/ml Monitor platelets by anticoagulation protocol: Yes   Plan:  -Heparin 1500 units BOLUS -Increase heparin drip to 1500 units/hr -0800 HL -Daily CBC/HL -Monitor for bleeding  Abran Duke 01/02/2015,12:12 AM

## 2015-01-02 NOTE — Progress Notes (Signed)
ANTICOAGULATION CONSULT NOTE - Follow Up Consult  Pharmacy Consult for Heparin  Indication: chest pain/ACS  Allergies  Allergen Reactions  . Ambien [Zolpidem Tartrate] Other (See Comments)    Severe hallucinations and behavior changes reported by family  . Ciprofloxacin     PATIENT AT VERY HIGH RISK FOR C DIFFICILE COLITIS  . Actos [Pioglitazone] Swelling  . Bydureon [Exenatide]     Stomach pain  . Lunesta [Eszopiclone] Other (See Comments)    Nightmares & hallucinations  . Morphine And Related Itching  . Protonix [Pantoprazole Sodium] Other (See Comments)    Unknown reaction-"Doctor told me to never take it"    Patient Measurements: Height: 5\' 4"  (162.6 cm) Weight: 221 lb 1.6 oz (100.29 kg) IBW/kg (Calculated) : 54.7  Vital Signs: Temp: 98.2 F (36.8 C) (10/30 0615) Temp Source: Oral (10/30 0615) BP: 152/68 mmHg (10/30 0615) Pulse Rate: 83 (10/30 0615)  Labs:  Recent Labs  12/31/14 1640 12/31/14 2058 12/31/14 2205 01/01/15 0213  01/01/15 0715 01/01/15 1540 01/01/15 2316 01/02/15 0735  HGB 10.8*  --   --  10.1*  --   --   --   --  10.5*  HCT 35.1*  --   --  32.6*  --   --   --   --  34.1*  PLT 246  --   --  195  --   --   --   --  169  LABPROT  --   --  13.7  --   --   --   --   --   --   INR  --   --  1.03  --   --   --   --   --   --   HEPARINUNFRC  --   --   --   --   < >  --  0.20* 0.22* 0.56  CREATININE 1.45*  --   --  1.11*  --   --   --   --  0.79  TROPONINI  --  0.03  --  0.03  --  <0.03  --   --   --   < > = values in this interval not displayed.  Estimated Creatinine Clearance: 86.1 mL/min (by C-G formula based on Cr of 0.79).   Assessment: 59 YOF who presented with chest pain. MD concerned for ustable angina. Pharmacy consulted to start IV heparin for ACS. Patient is not on any anticoagulants prior to admission. Hgb 10.5, Plt wnl, no bleeding noted, HL 0.56 (therapeutic) on 1500 units/hr.  Goal of Therapy:  Heparin level 0.3-0.7  units/ml Monitor platelets by anticoagulation protocol: Yes   Plan:  -continue heparin drip to 1500 units/hr -confirmatory 1400 HL  -Daily CBC/HL -Monitor for bleeding  Remi Haggard, PharmD Clinical Pharmacist- Resident Pager: [phone]  Remi Haggard 01/02/2015,8:54 AM

## 2015-01-02 NOTE — Progress Notes (Signed)
Subjective:  Echocardiogram yesterday shows ejection fraction of around 35% with some septal dyskinesis.  No complaints of shortness of breath.  She states that she has trouble laying flat.  CT scan showed extensive coronary calcification.  Objective:  Vital Signs in the last 24 hours: BP 152/68 mmHg  Pulse 83  Temp(Src) 98.2 F (36.8 C) (Oral)  Resp 21  Ht 5\' 4"  (1.626 m)  Wt 100.29 kg (221 lb 1.6 oz)  BMI 37.93 kg/m2  SpO2 97%  Physical Exam: Obese female wearing oxygen sitting up in bed complaining of left-sided chest pain Lungs:  Reduced breath sounds Cardiac:  Regular rhythm with irregular beats, normal S1 and S2, no S3 Abdomen:  Soft, nontender, no masses Extremities:  No edema present  Intake/Output from previous day: 10/29 0701 - 10/30 0700 In: 2041.6 [P.O.:300; I.V.:1741.6] Out: 3925 [Urine:3925]  Weight Filed Weights   12/31/14 2111 01/01/15 0400 01/02/15 0235  Weight: 99.247 kg (218 lb 12.8 oz) 99.202 kg (218 lb 11.2 oz) 100.29 kg (221 lb 1.6 oz)    Lab Results: Basic Metabolic Panel:  Recent Labs  91/47/82 0213 01/02/15 0735  NA 138 139  K 3.9 4.5  CL 95* 102  CO2 34* 29  GLUCOSE 244* 276*  BUN 22* 11  CREATININE 1.11* 0.79   CBC:  Recent Labs  12/31/14 1640 01/01/15 0213 01/02/15 0735  WBC 9.0 7.7 5.8  NEUTROABS 6.8  --   --   HGB 10.8* 10.1* 10.5*  HCT 35.1* 32.6* 34.1*  MCV 90.9 90.8 93.4  PLT 246 195 169   Cardiac Enzymes: Troponin (Point of Care Test)  Recent Labs  12/31/14 1631  TROPIPOC 0.02   Cardiac Panel (last 3 results)  Recent Labs  12/31/14 2058 01/01/15 0213 01/01/15 0715  TROPONINI 0.03 0.03 <0.03    Telemetry: Sinus rhythm with occasional PACs and occasional paced beats  Assessment/Plan:  1.  Prolonged chest discomfort in a patient with a previously abnormal myocardial perfusion scan 2.  Chronic respiratory failure 3.  Functioning permanent pacemaker 4.  History of paroxysmal atrial fibrillation 5.   Coronary calcification noted on CT scan  Recommendations:  No chest pain today.  Cardiac catheterization planned through a radial approach on Monday morning.  Cardiac catheterization was discussed with the patient fully including risks of myocardial infarction, death, stroke, bleeding, arrhythmia, dye allergy, renal insufficiency or bleeding.  The patient understands and is willing to proceed.   Darden Palmer  MD [facility]  01/02/2015, 9:33 AM

## 2015-01-02 NOTE — Progress Notes (Signed)
ANTICOAGULATION CONSULT NOTE - Follow Up Consult  Pharmacy Consult for Heparin  Indication: chest pain/ACS  Allergies  Allergen Reactions  . Ambien [Zolpidem Tartrate] Other (See Comments)    Severe hallucinations and behavior changes reported by family  . Ciprofloxacin     PATIENT AT VERY HIGH RISK FOR C DIFFICILE COLITIS  . Actos [Pioglitazone] Swelling  . Bydureon [Exenatide]     Stomach pain  . Lunesta [Eszopiclone] Other (See Comments)    Nightmares & hallucinations  . Morphine And Related Itching  . Protonix [Pantoprazole Sodium] Other (See Comments)    Unknown reaction-"Doctor told me to never take it"    Patient Measurements: Height: 5\' 4"  (162.6 cm) Weight: 221 lb 1.6 oz (100.29 kg) IBW/kg (Calculated) : 54.7  Vital Signs: Temp: 98.2 F (36.8 C) (10/30 0615) Temp Source: Oral (10/30 0615) BP: 152/68 mmHg (10/30 0615) Pulse Rate: 83 (10/30 0615)  Labs:  Recent Labs  12/31/14 1640 12/31/14 2058 12/31/14 2205 01/01/15 0213  01/01/15 0715  01/01/15 2316 01/02/15 0735 01/02/15 1455  HGB 10.8*  --   --  10.1*  --   --   --   --  10.5*  --   HCT 35.1*  --   --  32.6*  --   --   --   --  34.1*  --   PLT 246  --   --  195  --   --   --   --  169  --   LABPROT  --   --  13.7  --   --   --   --   --   --   --   INR  --   --  1.03  --   --   --   --   --   --   --   HEPARINUNFRC  --   --   --   --   < >  --   < > 0.22* 0.56 0.45  CREATININE 1.45*  --   --  1.11*  --   --   --   --  0.79  --   TROPONINI  --  0.03  --  0.03  --  <0.03  --   --   --   --   < > = values in this interval not displayed.  Estimated Creatinine Clearance: 86.1 mL/min (by C-G formula based on Cr of 0.79).   Assessment: 26 YOF who presented with chest pain. MD concerned for ustable angina. Pharmacy consulted to start IV heparin for ACS. Patient is not on any anticoagulants prior to admission. Hgb 10.5, Plt wnl, no bleeding noted, HL 0.56, then 0.42 (therapeutic) on 1500 units/hr.  Goal  of Therapy:  Heparin level 0.3-0.7 units/ml Monitor platelets by anticoagulation protocol: Yes   Plan:  -continue heparin drip to 1500 units/hr -Daily CBC/HL -Monitor for bleeding  Isaac Bliss, PharmD, BCPS Clinical Pharmacist Pager [phone] 01/02/2015 4:02 PM

## 2015-01-03 ENCOUNTER — Encounter (HOSPITAL_COMMUNITY)
Admission: EM | Disposition: A | Discharge status: Home / Self Care | Payer: Self-pay | Source: Home / Self Care | Attending: Cardiology

## 2015-01-03 HISTORY — PX: CARDIAC CATHETERIZATION: SHX172

## 2015-01-03 LAB — HEMOGLOBIN A1C
Hgb A1c MFr Bld: 7.8 % — ABNORMAL HIGH (ref 4.8–5.6)
Mean Plasma Glucose: 177 mg/dL

## 2015-01-03 LAB — GLUCOSE, CAPILLARY
Glucose-Capillary: 254 mg/dL — ABNORMAL HIGH (ref 65–99)
Glucose-Capillary: 203 mg/dL — ABNORMAL HIGH (ref 65–99)
Glucose-Capillary: 246 mg/dL — ABNORMAL HIGH (ref 65–99)
Glucose-Capillary: 306 mg/dL — ABNORMAL HIGH (ref 65–99)

## 2015-01-03 LAB — CBC
HEMATOCRIT: 33.7 % — AB (ref 36.0–46.0)
HEMOGLOBIN: 10 g/dL — AB (ref 12.0–15.0)
MCH: 27.8 pg (ref 26.0–34.0)
MCHC: 29.7 g/dL — AB (ref 30.0–36.0)
MCV: 93.6 fL (ref 78.0–100.0)
Platelets: 171 10*3/uL (ref 150–400)
RBC: 3.6 MIL/uL — ABNORMAL LOW (ref 3.87–5.11)
RDW: 14.2 % (ref 11.5–15.5)
WBC: 5.7 10*3/uL (ref 4.0–10.5)

## 2015-01-03 LAB — BASIC METABOLIC PANEL
ANION GAP: 6 (ref 5–15)
BUN: 12 mg/dL (ref 6–20)
CALCIUM: 8.4 mg/dL — AB (ref 8.9–10.3)
CHLORIDE: 101 mmol/L (ref 101–111)
CO2: 33 mmol/L — AB (ref 22–32)
CREATININE: 0.91 mg/dL (ref 0.44–1.00)
GFR calc non Af Amer: >60 mL/min (ref >60)
GLUCOSE: 350 mg/dL — AB (ref 65–99)
Potassium: 4.8 mmol/L (ref 3.5–5.1)
Sodium: 140 mmol/L (ref 135–145)

## 2015-01-03 LAB — HEPARIN LEVEL (UNFRACTIONATED): Heparin Unfractionated: 0.43 IU/mL (ref 0.30–0.70)

## 2015-01-03 SURGERY — LEFT HEART CATH AND CORONARY ANGIOGRAPHY
Anesthesia: LOCAL

## 2015-01-03 MED ORDER — IOHEXOL 350 MG/ML SOLN
INTRAVENOUS | Status: DC | PRN
  Administered 2015-01-03: 40 mL via INTRACARDIAC

## 2015-01-03 MED ORDER — MIDAZOLAM HCL 2 MG/2ML IJ SOLN
INTRAMUSCULAR | Status: AC
  Filled 2015-01-03: qty 4

## 2015-01-03 MED ORDER — SODIUM CHLORIDE 0.9 % IJ SOLN
3.0000 mL | Freq: Two times a day (BID) | INTRAMUSCULAR | Status: DC
  Administered 2015-01-04 – 2015-01-05 (×2): 3 mL via INTRAVENOUS

## 2015-01-03 MED ORDER — LIDOCAINE HCL (PF) 1 % IJ SOLN
INTRAMUSCULAR | Status: AC
  Filled 2015-01-03: qty 30

## 2015-01-03 MED ORDER — HEPARIN SODIUM (PORCINE) 1000 UNIT/ML IJ SOLN
INTRAMUSCULAR | Status: AC
  Filled 2015-01-03: qty 1

## 2015-01-03 MED ORDER — NITROGLYCERIN 1 MG/10 ML FOR IR/CATH LAB
INTRA_ARTERIAL | Status: DC | PRN
  Administered 2015-01-03: 16:00:00

## 2015-01-03 MED ORDER — HEPARIN (PORCINE) IN NACL 2-0.9 UNIT/ML-% IJ SOLN
INTRAMUSCULAR | Status: DC | PRN
Start: 2015-01-03 — End: 2015-01-03
  Administered 2015-01-03: 15:00:00 via INTRA_ARTERIAL

## 2015-01-03 MED ORDER — VERAPAMIL HCL 2.5 MG/ML IV SOLN
INTRAVENOUS | Status: AC
  Filled 2015-01-03: qty 2

## 2015-01-03 MED ORDER — NITROGLYCERIN 1 MG/10 ML FOR IR/CATH LAB
INTRA_ARTERIAL | Status: AC
  Filled 2015-01-03: qty 10

## 2015-01-03 MED ORDER — HEPARIN (PORCINE) IN NACL 2-0.9 UNIT/ML-% IJ SOLN
INTRAMUSCULAR | Status: AC
  Filled 2015-01-03: qty 1000

## 2015-01-03 MED ORDER — SODIUM CHLORIDE 0.9 % IV SOLN: INTRAVENOUS | Status: AC

## 2015-01-03 MED ORDER — FENTANYL CITRATE (PF) 100 MCG/2ML IJ SOLN
INTRAMUSCULAR | Status: DC | PRN
  Administered 2015-01-03 (×2): 25 ug via INTRAVENOUS

## 2015-01-03 MED ORDER — MIDAZOLAM HCL 2 MG/2ML IJ SOLN
INTRAMUSCULAR | Status: DC | PRN
  Administered 2015-01-03 (×2): 1 mg via INTRAVENOUS

## 2015-01-03 MED ORDER — HEPARIN SODIUM (PORCINE) 1000 UNIT/ML IJ SOLN
INTRAMUSCULAR | Status: DC | PRN
  Administered 2015-01-03: 5000 [IU] via INTRAVENOUS

## 2015-01-03 MED ORDER — SODIUM CHLORIDE 0.9 % IV SOLN: 250.0000 mL | INTRAVENOUS | Status: DC | PRN

## 2015-01-03 MED ORDER — FENTANYL CITRATE (PF) 100 MCG/2ML IJ SOLN
INTRAMUSCULAR | Status: AC
  Filled 2015-01-03: qty 4

## 2015-01-03 MED ORDER — FUROSEMIDE 10 MG/ML IJ SOLN
40.0000 mg | Freq: Two times a day (BID) | INTRAMUSCULAR | Status: DC
  Administered 2015-01-03 – 2015-01-04 (×2): 40 mg via INTRAVENOUS
  Filled 2015-01-03 (×2): qty 4

## 2015-01-03 MED ORDER — SODIUM CHLORIDE 0.9 % IJ SOLN: 3.0000 mL | INTRAMUSCULAR | Status: DC | PRN

## 2015-01-03 MED ORDER — FUROSEMIDE 40 MG PO TABS: 40.0000 mg | ORAL_TABLET | Freq: Two times a day (BID) | ORAL | Status: DC

## 2015-01-03 SURGICAL SUPPLY — 12 items

## 2015-01-03 NOTE — Progress Notes (Signed)
Utilization review completed. Maddoxx Burkitt, RN, BSN. 

## 2015-01-03 NOTE — Progress Notes (Signed)
Patient Name: [name] Date of Encounter: 01/03/2015  Principal Problem:   Chest pain with high risk for cardiac etiology Active Problems:   Hyperlipidemia   Diabetes mellitus type 2 in obese (HCC)   Hypertension   Hypercapnic respiratory failure, chronic [facility])    Primary Cardiologist: Dr. Ladona Ridgel Patient Profile: 59 y.o. female w/ PMH of CHB (s/p MDT PPM 2014), COPD, remote tob abuse, GERD, and NSTEMI secondary to vasospasm in 2006 admitted for unstable angina on 12/31/2014.  SUBJECTIVE: Reports having a episode of mild chest pain overnight which resolved on its own. Feels as if she is retaining fluid in her hands and feet since she has not received her Lasix while admitted.  Reports dyspnea has worsened.  OBJECTIVE Filed Vitals:   01/03/15 0330 01/03/15 0335 01/03/15 0609 01/03/15 0735  BP: 123/50 123/50 158/69 140/63  Pulse: 68 71 82 67  Temp: 98 F (36.7 C)   98.1 F (36.7 C)  TempSrc:    Oral  Resp: Height:      Weight: 224 lb 6.4 oz (101.787 kg)     SpO2: 100%  94% 98%    Intake/Output Summary (Last 24 hours) at 01/03/15 0914 Last data filed at 01/03/15 0300  Gross per 24 hour  Intake 2059.25 ml  Output   2025 ml  Net  34.25 ml   Filed Weights   01/01/15 0400 01/02/15 0235 01/03/15 0330  Weight: 218 lb 11.2 oz (99.202 kg) 221 lb 1.6 oz (100.29 kg) 224 lb 6.4 oz (101.787 kg)    PHYSICAL EXAM General: Well developed, well nourished, female in no acute distress. Head: Normocephalic, atraumatic.  Neck: Supple without bruits, JVD not elevated. Lungs:  Resp regular and unlabored, minimal rales at bases bilaterally . Heart: RRR, S1, S2, no S3, S4, or murmur; no rub. Abdomen: Soft, non-tender, non-distended with normoactive bowel sounds. No hepatomegaly. No rebound/guarding. No obvious abdominal masses. Extremities: No clubbing or cyanosis, 1+ edema bilaterally. Distal pedal pulses are 2+ bilaterally. Neuro: Alert and oriented X 3. Moves  all extremities spontaneously. Psych: Normal affect.   LABS: CBC: Recent Labs  12/31/14 1640  01/02/15 0735 01/03/15 0353  WBC 9.0  < > 5.8 5.7  NEUTROABS 6.8  --   --   --   HGB 10.8*  < > 10.5* 10.0*  HCT 35.1*  < > 34.1* 33.7*  MCV 90.9  < > 93.4 93.6  PLT 246  < > 169 171  < > = values in this interval not displayed. INR: Recent Labs  12/31/14 2205  INR 1.03   Basic Metabolic Panel: Recent Labs  12/31/14 2205  01/02/15 0735 01/03/15 0353  NA  --   < > 139 140  K  --   < > 4.5 4.8  CL  --   < > 102 101  CO2  --   < > 29 33*  GLUCOSE  --   < > 276* 350*  BUN  --   < > 11 12  CREATININE  --   < > 0.79 0.91  CALCIUM  --   < > 8.4* 8.4*  MG 1.5*  --   --   --   < > = values in this interval not displayed. Liver Function Tests: Recent Labs  12/31/14 1640  AST 41  ALT 34  ALKPHOS 49  BILITOT 1.0  PROT 6.3*  ALBUMIN 3.5   Cardiac Enzymes: Recent Labs  12/31/14  2058 01/01/15 0213 01/01/15 0715  TROPONINI 0.03 0.03 <0.03    Recent Labs  12/31/14 1631  TROPIPOC 0.02   Thyroid Function Tests: Recent Labs  12/31/14 2205  TSH 3.089   TELE:  NSR with rate in 60's - 90's.  Occasional paced beats.   ECHO: 01/01/2015 Study Conclusions - Left ventricle: The cavity size was normal. Wall thickness was increased in a pattern of mild LVH. Systolic function was moderately reduced. The estimated ejection fraction was in the range of 35% to 40%. Diffuse hypokinesis with incoordinate septal motion. Doppler parameters are consistent with abnormal left ventricular relaxation (grade 1 diastolic dysfunction). The E/e&' ratio is between 8-15, suggesting indeterminate LV filling pressure. - Ventricular septum: Septal motion showed abnormal function and dyssynergy. - Mitral valve: Mildly thickened leaflets . There was trivial regurgitation. - Left atrium: The atrium was normal in size. - Right ventricle: The cavity size was moderately  dilated. Wall thickness was normal. Systolic function is reduced. Lateral annulus peak S velocity: 9.7 cm/s. - Atrial septum: No defect or patent foramen ovale was identified. - Tricuspid valve: There was mild regurgitation. - Pulmonary arteries: PA peak pressure: 44 mm Hg (S). - Inferior vena cava: The vessel was dilated. The respirophasic diameter changes were blunted (< 50%), consistent with elevated central venous pressure.  Impressions: - LVEF 35-40%, intraventricular dyssynchrony, diffuse hypokinesis, diastolic dysfunction, indeterminate LV filling pressure, normal atrial size, moderately dilated RV with hypokinesis, mild TR, RVSP 44 mmHg.  Current Medications:  . albuterol  2.5 mg Nebulization TID  . aspirin EC  81 mg Oral BID  . atorvastatin  10 mg Oral QHS  . budesonide-formoterol  2 puff Inhalation BID  . famotidine  20 mg Oral QHS  . fluticasone  2 spray Each Nare BID  . HYDROcodone-acetaminophen  1 tablet Oral TID  . insulin aspart  0-15 Units Subcutaneous TID WC  . insulin aspart  0-5 Units Subcutaneous QHS  . rOPINIRole  1 mg Oral QHS  . sertraline  50 mg Oral Daily  . sodium chloride  3 mL Intravenous Q12H  . traZODone  75 mg Oral QHS   . sodium chloride 50 mL/hr at 01/02/15 1900  . sodium chloride 75 mL/hr at 01/02/15 2200  . heparin 1,500 Units/hr (01/02/15 2200)  . nitroGLYCERIN Stopped (12/31/14 1824)    ASSESSMENT AND PLAN:  1. Chest pain with high risk for cardiac etiology  - NST in 10/2014 with findings consistent with prior inferior / inferolateral myocardial infarction with medium defect of moderate severity present in the basal inferior, basal inferolateral, mid inferior, mid inferolateral and apical inferior location. -  Echo on 01/01/2015 showed EF of 35% to 40% with diffuse hypokinesis with incoordinate septal motion.  - CT scan showing extensive coronary calcification. - Cyclic troponin values have been negative. - continue ASA,  statin, and Heparin. - LCH scheduled for today at 1400 with Dr. Allyson Sabal.  2. Hyperlipidemia - continue statin therapy  3. Diabetes mellitus type 2 in obese (HCC) - continue SSI  4. Hypertension - ACE-I on hold secondary to elevated creatinine at admission and low BP's at that time. - BP has been 123/50 - 158/69 in the past 24 hours. - plan to restart Lisinopril following cath.  5. Hypercapnic respiratory failure, chronic (HCC) - on 4L O2 at home  6. Chronic Combined Systolic and Diastolic CHF - Echo on 01/01/2015 showed EF of 35% to 40% and grade 1 diastolic dysfunction. EF was previously 50-55% in 2014. -  weight was 218 lbs at admission. Now 224 lbs on 01/03/2015. Home lasix held secondary to AKI (creatinine 1.45 on admission) and pending cath.  - Will need to restart home Lasix following cath today.    Signed, Ellsworth Lennox , PA-C 9:14 AM 01/03/2015 Pager: [phone]

## 2015-01-03 NOTE — Interval H&P Note (Signed)
History and Physical Interval Note:  01/03/2015 2:52 PM  [name]  has presented today for cardiac cath with the diagnosis of unstable angina. The various methods of treatment have been discussed with the patient and family. After consideration of risks, benefits and other options for treatment, the patient has consented to  Procedure(s): Left Heart Cath and Coronary Angiography (N/A) as a surgical intervention .  The patient's history has been reviewed, patient examined, no change in status, stable for surgery.  I have reviewed the patient's chart and labs.  Questions were answered to the patient's satisfaction.    Cath Lab Visit (complete for each Cath Lab visit)  Clinical Evaluation Leading to the Procedure:   ACS: No.  Non-ACS:    Anginal Classification: CCS III  Anti-ischemic medical therapy: No Therapy  Non-Invasive Test Results: No non-invasive testing performed  Prior CABG: No previous CABG         MCALHANY,CHRISTOPHER

## 2015-01-03 NOTE — Progress Notes (Signed)
R radial and R groin shaved in preparation for cardiac cath. Pt NPO since midnight. Aspirin 81 mg administered as ordered.

## 2015-01-03 NOTE — Care Management Important Message (Signed)
Important Message  Patient Details  Name: [name] MRN: [id number] Date of Birth: 1955-05-07   Medicare Important Message Given:  Yes-second notification given    Kyla Balzarine 01/03/2015, 5:33 PM

## 2015-01-03 NOTE — Progress Notes (Signed)
ANTICOAGULATION CONSULT NOTE - Follow Up Consult  Pharmacy Consult for Heparin  Indication: chest pain/ACS  Allergies  Allergen Reactions  . Ambien [Zolpidem Tartrate] Other (See Comments)    Severe hallucinations and behavior changes reported by family  . Ciprofloxacin     PATIENT AT VERY HIGH RISK FOR C DIFFICILE COLITIS  . Actos [Pioglitazone] Swelling  . Bydureon [Exenatide]     Stomach pain  . Lunesta [Eszopiclone] Other (See Comments)    Nightmares & hallucinations  . Morphine And Related Itching  . Protonix [Pantoprazole Sodium] Other (See Comments)    Unknown reaction-"Doctor told me to never take it"    Patient Measurements: Height: 5\' 4"  (162.6 cm) Weight: 224 lb 6.4 oz (101.787 kg) IBW/kg (Calculated) : 54.7  Vital Signs: Temp: 98.1 F (36.7 C) (10/31 0735) Temp Source: Oral (10/31 0735) BP: 140/63 mmHg (10/31 0735) Pulse Rate: 67 (10/31 0735)  Labs:  Recent Labs  12/31/14 2058 12/31/14 2205 01/01/15 1607  01/01/15 0715  01/02/15 0735 01/02/15 1455 01/03/15 0353  HGB  --   --  10.1*  --   --   --  10.5*  --  10.0*  HCT  --   --  32.6*  --   --   --  34.1*  --  33.7*  PLT  --   --  195  --   --   --  169  --  171  LABPROT  --  13.7  --   --   --   --   --   --   --   INR  --  1.03  --   --   --   --   --   --   --   HEPARINUNFRC  --   --   --   < >  --   < > 0.56 0.45 0.43  CREATININE  --   --  1.11*  --   --   --  0.79  --  0.91  TROPONINI 0.03  --  0.03  --  <0.03  --   --   --   --   < > = values in this interval not displayed.  Estimated Creatinine Clearance: 76.3 mL/min (by C-G formula based on Cr of 0.91).   Assessment: 55 YOF who presented with chest pain. MD concerned for ustable angina. CTA neg for PE. Pharmacy consulted to start IV heparin for ACS. Patient is not on any anticoagulants prior to admission. Hgb 10 stable, Plt wnl, no bleeding noted, HL therapeutic (0.43) on 1500 units/h. Cath scheduled for this afternoon  Goal of Therapy:   Heparin level 0.3-0.7 units/ml Monitor platelets by anticoagulation protocol: Yes   Plan:  -Continue hep at 1500 units/hr -Monitor daily HL, CBC and s/s of bleeding -Cath 11/1 @1400   Babs Bertin, PharmD Clinical Pharmacist Pager [phone] 01/03/2015 10:46 AM

## 2015-01-03 NOTE — H&P (View-Only) (Signed)
Patient Name: [name] Date of Encounter: 01/03/2015  Principal Problem:   Chest pain with high risk for cardiac etiology Active Problems:   Hyperlipidemia   Diabetes mellitus type 2 in obese (HCC)   Hypertension   Hypercapnic respiratory failure, chronic [facility])    Primary Cardiologist: Dr. Ladona Ridgel Patient Profile: 59 y.o. female w/ PMH of CHB (s/p MDT PPM 2014), COPD, remote tob abuse, GERD, and NSTEMI secondary to vasospasm in 2006 admitted for unstable angina on 12/31/2014.  SUBJECTIVE: Reports having a episode of mild chest pain overnight which resolved on its own. Feels as if she is retaining fluid in her hands and feet since she has not received her Lasix while admitted.  Reports dyspnea has worsened.  OBJECTIVE Filed Vitals:   01/03/15 0330 01/03/15 0335 01/03/15 0609 01/03/15 0735  BP: 123/50 123/50 158/69 140/63  Pulse: 68 71 82 67  Temp: 98 F (36.7 C)   98.1 F (36.7 C)  TempSrc:    Oral  Resp: Height:      Weight: 224 lb 6.4 oz (101.787 kg)     SpO2: 100%  94% 98%    Intake/Output Summary (Last 24 hours) at 01/03/15 0914 Last data filed at 01/03/15 0300  Gross per 24 hour  Intake 2059.25 ml  Output   2025 ml  Net  34.25 ml   Filed Weights   01/01/15 0400 01/02/15 0235 01/03/15 0330  Weight: 218 lb 11.2 oz (99.202 kg) 221 lb 1.6 oz (100.29 kg) 224 lb 6.4 oz (101.787 kg)    PHYSICAL EXAM General: Well developed, well nourished, female in no acute distress. Head: Normocephalic, atraumatic.  Neck: Supple without bruits, JVD not elevated. Lungs:  Resp regular and unlabored, minimal rales at bases bilaterally . Heart: RRR, S1, S2, no S3, S4, or murmur; no rub. Abdomen: Soft, non-tender, non-distended with normoactive bowel sounds. No hepatomegaly. No rebound/guarding. No obvious abdominal masses. Extremities: No clubbing or cyanosis, 1+ edema bilaterally. Distal pedal pulses are 2+ bilaterally. Neuro: Alert and oriented X 3. Moves  all extremities spontaneously. Psych: Normal affect.   LABS: CBC: Recent Labs  12/31/14 1640  01/02/15 0735 01/03/15 0353  WBC 9.0  < > 5.8 5.7  NEUTROABS 6.8  --   --   --   HGB 10.8*  < > 10.5* 10.0*  HCT 35.1*  < > 34.1* 33.7*  MCV 90.9  < > 93.4 93.6  PLT 246  < > 169 171  < > = values in this interval not displayed. INR: Recent Labs  12/31/14 2205  INR 1.03   Basic Metabolic Panel: Recent Labs  12/31/14 2205  01/02/15 0735 01/03/15 0353  NA  --   < > 139 140  K  --   < > 4.5 4.8  CL  --   < > 102 101  CO2  --   < > 29 33*  GLUCOSE  --   < > 276* 350*  BUN  --   < > 11 12  CREATININE  --   < > 0.79 0.91  CALCIUM  --   < > 8.4* 8.4*  MG 1.5*  --   --   --   < > = values in this interval not displayed. Liver Function Tests: Recent Labs  12/31/14 1640  AST 41  ALT 34  ALKPHOS 49  BILITOT 1.0  PROT 6.3*  ALBUMIN 3.5   Cardiac Enzymes: Recent Labs  12/31/14  2058 01/01/15 0213 01/01/15 0715  TROPONINI 0.03 0.03 <0.03    Recent Labs  12/31/14 1631  TROPIPOC 0.02   Thyroid Function Tests: Recent Labs  12/31/14 2205  TSH 3.089   TELE:  NSR with rate in 60's - 90's.  Occasional paced beats.   ECHO: 01/01/2015 Study Conclusions - Left ventricle: The cavity size was normal. Wall thickness was increased in a pattern of mild LVH. Systolic function was moderately reduced. The estimated ejection fraction was in the range of 35% to 40%. Diffuse hypokinesis with incoordinate septal motion. Doppler parameters are consistent with abnormal left ventricular relaxation (grade 1 diastolic dysfunction). The E/e&' ratio is between 8-15, suggesting indeterminate LV filling pressure. - Ventricular septum: Septal motion showed abnormal function and dyssynergy. - Mitral valve: Mildly thickened leaflets . There was trivial regurgitation. - Left atrium: The atrium was normal in size. - Right ventricle: The cavity size was moderately  dilated. Wall thickness was normal. Systolic function is reduced. Lateral annulus peak S velocity: 9.7 cm/s. - Atrial septum: No defect or patent foramen ovale was identified. - Tricuspid valve: There was mild regurgitation. - Pulmonary arteries: PA peak pressure: 44 mm Hg (S). - Inferior vena cava: The vessel was dilated. The respirophasic diameter changes were blunted (< 50%), consistent with elevated central venous pressure.  Impressions: - LVEF 35-40%, intraventricular dyssynchrony, diffuse hypokinesis, diastolic dysfunction, indeterminate LV filling pressure, normal atrial size, moderately dilated RV with hypokinesis, mild TR, RVSP 44 mmHg.  Current Medications:  . albuterol  2.5 mg Nebulization TID  . aspirin EC  81 mg Oral BID  . atorvastatin  10 mg Oral QHS  . budesonide-formoterol  2 puff Inhalation BID  . famotidine  20 mg Oral QHS  . fluticasone  2 spray Each Nare BID  . HYDROcodone-acetaminophen  1 tablet Oral TID  . insulin aspart  0-15 Units Subcutaneous TID WC  . insulin aspart  0-5 Units Subcutaneous QHS  . rOPINIRole  1 mg Oral QHS  . sertraline  50 mg Oral Daily  . sodium chloride  3 mL Intravenous Q12H  . traZODone  75 mg Oral QHS   . sodium chloride 50 mL/hr at 01/02/15 1900  . sodium chloride 75 mL/hr at 01/02/15 2200  . heparin 1,500 Units/hr (01/02/15 2200)  . nitroGLYCERIN Stopped (12/31/14 1824)    ASSESSMENT AND PLAN:  1. Chest pain with high risk for cardiac etiology  - NST in 10/2014 with findings consistent with prior inferior / inferolateral myocardial infarction with medium defect of moderate severity present in the basal inferior, basal inferolateral, mid inferior, mid inferolateral and apical inferior location. -  Echo on 01/01/2015 showed EF of 35% to 40% with diffuse hypokinesis with incoordinate septal motion.  - CT scan showing extensive coronary calcification. - Cyclic troponin values have been negative. - continue ASA,  statin, and Heparin. - LCH scheduled for today at 1400 with Dr. Allyson Sabal.  2. Hyperlipidemia - continue statin therapy  3. Diabetes mellitus type 2 in obese (HCC) - continue SSI  4. Hypertension - ACE-I on hold secondary to elevated creatinine at admission and low BP's at that time. - BP has been 123/50 - 158/69 in the past 24 hours. - plan to restart Lisinopril following cath.  5. Hypercapnic respiratory failure, chronic (HCC) - on 4L O2 at home  6. Chronic Combined Systolic and Diastolic CHF - Echo on 01/01/2015 showed EF of 35% to 40% and grade 1 diastolic dysfunction. EF was previously 50-55% in 2014. -  weight was 218 lbs at admission. Now 224 lbs on 01/03/2015. Home lasix held secondary to AKI (creatinine 1.45 on admission) and pending cath.  - Will need to restart home Lasix following cath today.    Signed, Brittany M Strader , PA-C 9:14 AM 01/03/2015 Pager: [phone]  

## 2015-01-04 ENCOUNTER — Encounter (HOSPITAL_COMMUNITY): Payer: Self-pay | Admitting: Cardiovascular Disease

## 2015-01-04 DIAGNOSIS — I5041 Acute combined systolic (congestive) and diastolic (congestive) heart failure: Secondary | ICD-10-CM | POA: Diagnosis present

## 2015-01-04 DIAGNOSIS — I428 Other cardiomyopathies: Secondary | ICD-10-CM

## 2015-01-04 LAB — GLUCOSE, CAPILLARY
Glucose-Capillary: 238 mg/dL — ABNORMAL HIGH (ref 65–99)
Glucose-Capillary: 301 mg/dL — ABNORMAL HIGH (ref 65–99)
Glucose-Capillary: 215 mg/dL — ABNORMAL HIGH (ref 65–99)
Glucose-Capillary: 354 mg/dL — ABNORMAL HIGH (ref 65–99)

## 2015-01-04 MED ORDER — LISINOPRIL 20 MG PO TABS
20.0000 mg | ORAL_TABLET | Freq: Every day | ORAL | Status: DC
  Administered 2015-01-04 – 2015-01-05 (×2): 20 mg via ORAL
  Filled 2015-01-04 (×2): qty 1

## 2015-01-04 MED ORDER — INSULIN ASPART 100 UNIT/ML ~~LOC~~ SOLN
0.0000 [IU] | Freq: Three times a day (TID) | SUBCUTANEOUS | Status: DC
  Administered 2015-01-04: 15 [IU] via SUBCUTANEOUS
  Administered 2015-01-04: 7 [IU] via SUBCUTANEOUS
  Administered 2015-01-05: 11 [IU] via SUBCUTANEOUS
  Administered 2015-01-05: 20 [IU] via SUBCUTANEOUS

## 2015-01-04 MED ORDER — FUROSEMIDE 80 MG PO TABS
80.0000 mg | ORAL_TABLET | Freq: Two times a day (BID) | ORAL | Status: DC
  Administered 2015-01-04 – 2015-01-05 (×2): 80 mg via ORAL
  Filled 2015-01-04 (×2): qty 1

## 2015-01-04 MED ORDER — INSULIN ASPART 100 UNIT/ML ~~LOC~~ SOLN
0.0000 [IU] | Freq: Every day | SUBCUTANEOUS | Status: DC
  Administered 2015-01-04: 5 [IU] via SUBCUTANEOUS

## 2015-01-04 MED ORDER — INSULIN GLARGINE 100 UNIT/ML ~~LOC~~ SOLN
15.0000 [IU] | Freq: Every day | SUBCUTANEOUS | Status: DC
  Administered 2015-01-04: 15 [IU] via SUBCUTANEOUS
  Filled 2015-01-04 (×3): qty 0.15

## 2015-01-04 MED ORDER — METOPROLOL TARTRATE 12.5 MG HALF TABLET
12.5000 mg | ORAL_TABLET | Freq: Two times a day (BID) | ORAL | Status: DC
  Administered 2015-01-04 – 2015-01-05 (×2): 12.5 mg via ORAL
  Filled 2015-01-04 (×2): qty 1

## 2015-01-04 NOTE — Progress Notes (Signed)
Subjective:  Still dyspneic  Objective:  Vital Signs in the last 24 hours: Temp:  [98 F (36.7 C)-99.3 F (37.4 C)] 98.3 F (36.8 C) (11/01 0500) Pulse Rate:  [0-81] 80 (11/01 0940) Resp:  [0-25] 18 (11/01 0940) BP: (126-163)/(61-101) 144/98 mmHg (11/01 0500) SpO2:  [0 %-99 %] 98 % (11/01 0940) Weight:  [221 lb 12.8 oz (100.608 kg)] 221 lb 12.8 oz (100.608 kg) (11/01 0500)  Intake/Output from previous day:  Intake/Output Summary (Last 24 hours) at 01/04/15 0954 Last data filed at 01/04/15 0500  Gross per 24 hour  Intake    460 ml  Output   3400 ml  Net  -2940 ml    Physical Exam: General appearance: alert, cooperative, no distress, moderately obese and on O2 Lungs: decreased breath sounds Heart: regular rate and rhythm Extremities: no edema Skin: pale, cool, dry Neurologic: Grossly normal   Rate: 70  Rhythm: paced  Lab Results:  Recent Labs  01/02/15 0735 01/03/15 0353  WBC 5.8 5.7  HGB 10.5* 10.0*  PLT 169 171    Recent Labs  01/02/15 0735 01/03/15 0353  NA 139 140  K 4.5 4.8  CL 102 101  CO2 29 33*  GLUCOSE 276* 350*  BUN 11 12  CREATININE 0.79 0.91   No results for input(s): TROPONINI in the last 72 hours.  Invalid input(s): CK, MB No results for input(s): INR in the last 72 hours.  Scheduled Meds: . albuterol  2.5 mg Nebulization TID  . aspirin EC  81 mg Oral BID  . atorvastatin  10 mg Oral QHS  . budesonide-formoterol  2 puff Inhalation BID  . famotidine  20 mg Oral QHS  . fluticasone  2 spray Each Nare BID  . furosemide  40 mg Intravenous BID  . HYDROcodone-acetaminophen  1 tablet Oral TID  . insulin aspart  0-15 Units Subcutaneous TID WC  . insulin aspart  0-5 Units Subcutaneous QHS  . rOPINIRole  1 mg Oral QHS  . sertraline  50 mg Oral Daily  . sodium chloride  3 mL Intravenous Q12H  . traZODone  75 mg Oral QHS   Continuous Infusions:  PRN Meds:.sodium chloride, acetaminophen, albuterol, ALPRAZolam, fentaNYL (SUBLIMAZE)  injection, nitroGLYCERIN, ondansetron (ZOFRAN) IV, sodium chloride   Imaging: Imaging results have been reviewed  Cardiac Studies:  Echo 01/01/15 Study Conclusions  - Left ventricle: The cavity size was normal. Wall thickness was increased in a pattern of mild LVH. Systolic function was moderately reduced. The estimated ejection fraction was in the range of 35% to 40%. Diffuse hypokinesis with incoordinate septal motion. Doppler parameters are consistent with abnormal left ventricular relaxation (grade 1 diastolic dysfunction). The E/e&' ratio is between 8-15, suggesting indeterminate LV filling pressure. - Ventricular septum: Septal motion showed abnormal function and dyssynergy. - Mitral valve: Mildly thickened leaflets . There was trivial regurgitation. - Left atrium: The atrium was normal in size. - Right ventricle: The cavity size was moderately dilated. Wall thickness was normal. Systolic function is reduced. Lateral annulus peak S velocity: 9.7 cm/s. - Atrial septum: No defect or patent foramen ovale was identified. - Tricuspid valve: There was mild regurgitation. - Pulmonary arteries: PA peak pressure: 44 mm Hg (S). - Inferior vena cava: The vessel was dilated. The respirophasic diameter changes were blunted (< 50%), consistent with elevated central venous pressure.  Impressions:  - LVEF 35-40%, intraventricular dyssynchrony, diffuse hypokinesis, diastolic dysfunction, indeterminate LV filling pressure, normal atrial size, moderately dilated RV with hypokinesis, mild  TR, RVSP 44 mmHg.   Cath 12/3114 1. No angiographic evidence of CAD 2. Non-ischemic cardiomyopathy (LVEF obtained by echo).    Assessment/Plan:  59 y.o. female obese w/ PMH of CHB (s/p MDT PPM 2014), COPD on chronic O2, GERD, DM, HTN, dyslipidemia, and a history of a NSTEMI secondary to vasospasm in 2006. She was admitted 12/31/14 with chest pain concerning for for  unstable angina. Recent Myoview Aug 2016 was intermediate. The pt's husband (pt of ours as well) died at home this past Oct 18, 2022 a few days before he was supposed to have an ICD placed. The pt has significant anxiety related to this and "wanted to make sure my arteries were OK". Her coronaries are normal but she appears to have mixed CHF and new NICM. Her I/O is negative 4L, wgt 221- she says home wgt in 205-210.   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 Her B/P is not well controlled, will resume Lisinopril, (held prior to admission for SCr of 1.45). Her BS are also not controlled (Glucopahge also held on admission), will ask for diabetic consult, increase SS to resistant, would prefer not to resume Glucophage with renal insufficiency.   [employer] PA-C 01/04/2015, 9:54 AM [phone]

## 2015-01-04 NOTE — Progress Notes (Signed)
Inpatient Diabetes Program Recommendations  AACE/ADA: New Consensus Statement on Inpatient Glycemic Control (2015)  Target Ranges:  Prepandial:   less than 140 mg/dL      Peak postprandial:   less than 180 mg/dL (1-2 hours)      Critically ill patients:  140 - 180 mg/dL   Consult for diabetes uncontrolled  Review of Glycemic Control Results for [name], [name] (MRN [id number]) as of 01/04/2015 12:56  Ref. Range 01/02/2015 16:46 01/02/2015 21:03 01/03/2015 07:37 01/03/2015 11:20 01/03/2015 16:23 01/03/2015 21:49 01/04/2015 07:28 01/04/2015 11:42  Glucose-Capillary Latest Ref Range: 65-99 mg/dL 003 (H) 496 (H) 116 (H) 203 (H) 246 (H) 306 (H) 238 (H) 301 (H)    Diabetes history: dm 2 Outpatient Diabetes medications: Novolog 3-8 units tidwc based on cbg, metformin 1000 Current orders for Inpatient glycemic control: Resistant correction tidwc and HS scales.  Inpatient Diabetes Program Recommendations: Please consider addition of basal lantus at 15-20 units at HS as fastings are high and correction does not appear to control alone. Weight in kg of 100 kg, at 0.2 units/kg, pt would need 20 units lantus to start. (agree with holding metformin at this time). HgbA1C is 7.8%. Patient may well benefit from addition of lantus at home as well.     Thank you Lenor Coffin, RN, MSN, CDE  Diabetes Inpatient Program Office: [phone] Pager: [phone] 8:00 am to 5:00 pm

## 2015-01-05 ENCOUNTER — Encounter (HOSPITAL_COMMUNITY): Payer: Self-pay | Admitting: Physician Assistant

## 2015-01-05 ENCOUNTER — Telehealth: Payer: Self-pay | Admitting: Physician Assistant

## 2015-01-05 DIAGNOSIS — I201 Angina pectoris with documented spasm: Secondary | ICD-10-CM | POA: Diagnosis present

## 2015-01-05 DIAGNOSIS — I5042 Chronic combined systolic (congestive) and diastolic (congestive) heart failure: Secondary | ICD-10-CM | POA: Diagnosis present

## 2015-01-05 DIAGNOSIS — Z8679 Personal history of other diseases of the circulatory system: Secondary | ICD-10-CM

## 2015-01-05 DIAGNOSIS — R079 Chest pain, unspecified: Secondary | ICD-10-CM | POA: Diagnosis present

## 2015-01-05 LAB — GLUCOSE, CAPILLARY
Glucose-Capillary: 288 mg/dL — ABNORMAL HIGH (ref 65–99)
Glucose-Capillary: 365 mg/dL — ABNORMAL HIGH (ref 65–99)

## 2015-01-05 LAB — BASIC METABOLIC PANEL
Anion gap: 6 (ref 5–15)
BUN: 16 mg/dL (ref 6–20)
CO2: 38 mmol/L — ABNORMAL HIGH (ref 22–32)
Calcium: 8.6 mg/dL — ABNORMAL LOW (ref 8.9–10.3)
Chloride: 91 mmol/L — ABNORMAL LOW (ref 101–111)
Creatinine, Ser: 0.98 mg/dL (ref 0.44–1.00)
GFR calc Af Amer: >60 mL/min (ref >60)
GFR calc non Af Amer: >60 mL/min (ref >60)
Glucose, Bld: 321 mg/dL — ABNORMAL HIGH (ref 65–99)
Potassium: 4 mmol/L (ref 3.5–5.1)
Sodium: 135 mmol/L (ref 135–145)

## 2015-01-05 MED ORDER — METHOCARBAMOL 750 MG PO TABS
750.0000 mg | ORAL_TABLET | Freq: Once | ORAL | Status: AC
  Administered 2015-01-05: 750 mg via ORAL
  Filled 2015-01-05: qty 1

## 2015-01-05 MED ORDER — METOPROLOL SUCCINATE ER 25 MG PO TB24: 25.0000 mg | ORAL_TABLET | Freq: Every day | ORAL | Status: DC

## 2015-01-05 MED ORDER — INSULIN GLARGINE 100 UNIT/ML ~~LOC~~ SOLN: 15.0000 [IU] | Freq: Every day | SUBCUTANEOUS | Status: DC

## 2015-01-05 MED ORDER — METFORMIN HCL 1000 MG PO TABS: 1000.0000 mg | ORAL_TABLET | Freq: Two times a day (BID) | ORAL | Status: DC

## 2015-01-05 MED ORDER — FUROSEMIDE 80 MG PO TABS: 80.0000 mg | ORAL_TABLET | Freq: Two times a day (BID) | ORAL | Status: DC

## 2015-01-05 NOTE — Discharge Summary (Signed)
Discharge Summary   Patient ID: [name] MRN: [id number], DOB/AGE: 05/15/1955 59 y.o. Admit date: 12/31/2014 D/C date:     01/05/2015  Primary Cardiologist: Dr. 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 female with a history of CHB (s/p MDT PPM 2014), COPD on chronic O2, GERD, DM, HTN, HLD, PAF and a history of a NSTEMI 2/2 vasospasm in 2006 who was admitted 12/31/14 with chest pain concerning for for unstable angina.  She presented with back pain that progressed to pressure like chest pain radiating to her left arm, the pain was so severe that she went to the ground and called EMS. She was brought to the [facility] ED and she was admitted for unstable angina. CTA done in the ER showed no PE, dissection and no pulmonary edema and diffuse coronary calcifications. Echo in 2014 showed normal LVEF and nuclear stress test in 10/2014 showed LVEF 44% with regional wall motion abnormalities. A repeat 2D ECHO revealed EF 35-40%, intraventricular dyssynchrony, diffuse hypokinesis, diastolic dysfunction. With newly reduced EF and recent intermediate nuclear stress test, she was set up for coronary angiography. She underwent LHC on 01/03/15 which revealed no angiographic evidence of CAD. She did have evidence of volume overload and she was diuresed with IV lasix and started on  Toprol XL. Her home lisinopril was resumed. EP saw her during admission and device interrogation revealed optimal device functioning. Additionally her BS were not well controlled and lantus was added to her medical regimen. Digoxin and diltiazem were stopped on admission due to low BPs and never resumed. These were held at discharge.    Hospital Course  Chest pain- now resolved.  -- CTA done in the ER showed no PE, dissection and no pulmonary edema and diffuse coronary calcifications.  -- Troponin neg x2.  -- Nuclear stress test in 10/2014 showed LVEF 44% with regional wall motion abnormalities. -- s/p LHC 01/03/15 with no angiographic evidence of CAD  Acute combined systolic and diastolic heart failure/ NICM - EF 35-40%  -- s/p LHC with clean coronaries. 2D ECHO with EF 35-40%, intraventricular dyssynchrony, diffuse hypokinesis, diastolic dysfunction, indeterminate LV filling pressure, normal atrial size, moderately dilated RV with hypokinesis, mild TR,RVSP 44 mmHg.  -- Converted from IV lasix to PO Lasix  last night. Net neg 6.6L. She diuresed -2.6 L yesterday/overnight. Discharge weight 215lbs. -- Continue lisinopril  and Toprol XL  as well as lasix  BID. She has not been on any potassium supplementation during admission and K has been fine. BMET at follow up  Hx of CHB s/p PPM - Medtronic, placed 2014 -- Follow up with Dr. Ladona Ridgel has been arranged. EP saw her during admission and device interrogation revealed optimal device functioning. Pt only A paces rarely and never V paces. Histograms appropriate.   PAF- per Dr. Lubertha Basque last note she maintains NSR 99.9% of  the time. She was previously on digoxin and diltiazem prior to admission. These were discontinued upon admission and never resumed. I will continue to hold these at discharge. Her CHADSVASC score is at least 4 (HTN 1 CHF 1 DM 1, F sex 1). Will defer need for long term anticoagulation to Dr. Ladona Ridgel at follow  up.  DMT2- HgbA1C is 7.8%. Diabetes consult who recommended adding basal lantus.  -- Her Metformin was held on admission. This will be held 48 hours post cardiac cath. Okay to resume tomorrow AM. Her kidney function has normalized.  -- She will follow up with her PCP in the next couple weeks.   Hypercapnic respiratory failure, chronic- continue home 02.  Hypertension- BP better controlled. Continue lisinopril 20mg  and Toprol XL 25mg   Obesity- diet and exercise recommended  HLD- continue statin   The patient has had an uncomplicated hospital course and is recovering well. The radial catheter site is stable. She has been seen by Dr. Horris Latino today and deemed ready for discharge home. All follow-up appointments have been scheduled.  Discharge medications are listed below.   Discharge Vitals: Blood pressure 120/70, pulse 64, temperature 97.8 F (36.6 C), temperature source Oral, resp. rate 18, height 5\' 4"  (1.626 m), weight 215 lb 12.8 oz (97.886 kg), SpO2 98 %.  Labs: Lab Results  Component Value Date   WBC 5.7 01/03/2015   HGB 10.0* 01/03/2015   HCT 33.7* 01/03/2015   MCV 93.6 01/03/2015   PLT 171 01/03/2015    Recent Labs Lab 12/31/14 1640  01/05/15 0308  NA 132*  < > 135  K 4.6  < > 4.0  CL 87*  < > 91*  CO2 33*  < > 38*  BUN 27*  < > 16  CREATININE 1.45*  < > 0.98  CALCIUM 8.7*  < > 8.6*  PROT 6.3*  --   --   BILITOT 1.0  --   --   ALKPHOS 49  --   --   ALT 34  --   --   AST 41  --   --   GLUCOSE 380*  < > 321*  < > = values in this interval not displayed.   Diagnostic Studies/Procedures   Dg Chest Port 1 View  12/31/2014  CLINICAL DATA:  Chest pain EXAM: PORTABLE CHEST 1 VIEW COMPARISON:  08/23/2014 FINDINGS: Moderate cardiomegaly. Dual lead left subclavian pacemaker device and leads are stable and intact. Lungs are clear. Pulmonary vascularity is within normal limits. Vascular congestion previously noted has resolved. IMPRESSION: Cardiomegaly without  decompensation. Electronically Signed   By: Jolaine Click M.D.   On: 12/31/2014 16:43   Ct Angio Chest Aorta W/cm &/or Wo/cm  12/31/2014  CLINICAL DATA:  Three-day history of chest pain and shortness of breath EXAM: CT ANGIOGRAPHY CHEST WITH CONTRAST TECHNIQUE: Initially, axial CT images were obtained through the chest without intravenous contrast material administration. Multidetector CT imaging of the chest was performed using the standard protocol during bolus administration of intravenous contrast. Multiplanar CT image reconstructions and MIPs were obtained to evaluate the vascular anatomy. CONTRAST:  OMNIPAQUE IOHEXOL 350 MG/ML SOLN COMPARISON:  Chest CT Jul 31, 2013 ; chest radiograph December 31, 2014 FINDINGS: There is no demonstrable pulmonary embolus. There is no thoracic aortic aneurysm or dissection. The visualized great vessels appear unremarkable. Atherosclerotic calcification is noted aortic arch region. There is no edema or consolidation. There is mild bibasilar scarring. Pacemaker leads are attached to the right atrium and right  ventricle. Pericardium is not thickened. There are multiple foci of coronary artery calcification. Stable nodular lesions are noted in the right lobe of the thyroid, largest measuring 1.0 x 0.9 cm. There is no demonstrable thoracic adenopathy. In the visualized upper abdomen, there is a posterior right-sided Bochdalek -type hernia. There is evidence of a degree of hepatic steatosis. Gallbladder is absent. Atherosclerotic calcification is noted in the upper abdominal aorta. There is degenerative change in the thoracic spine. There are no blastic or lytic bone lesions. Review of the MIP images confirms the above findings. IMPRESSION: No demonstrable pulmonary embolus. No thoracic aortic aneurysm or dissection. No lung edema or consolidation.  No adenopathy. Pacemaker leads attached to right atrium right ventricle. Extensive coronary artery calcification noted.  Multinodular goiter, stable. Hepatic steatosis. Posterior Bochdalek type hernia on the right, stable. Electronically Signed   By: Bretta Bang III M.D.   On: 12/31/2014 19:32    ECHO: 01/01/2015 Study Conclusions - Left ventricle: The cavity size was normal. Wall thickness was increased in a pattern of mild LVH. Systolic function was moderately reduced. The estimated ejection fraction was in the range of 35% to 40%. Diffuse hypokinesis with incoordinate septal motion. Doppler parameters are consistent with abnormal left ventricular relaxation (grade 1 diastolic dysfunction). The E/e&' ratio is between 8-15, suggesting indeterminate LV filling pressure. - Ventricular septum: Septal motion showed abnormal function and dyssynergy. - Mitral valve: Mildly thickened leaflets . There was trivial regurgitation. - Left atrium: The atrium was normal in size. - Right ventricle: The cavity size was moderately dilated. Wall thickness was normal. Systolic function is reduced. Lateral annulus peak S velocity: 9.7 cm/s. - Atrial septum: No defect or patent foramen ovale was identified. - Tricuspid valve: There was mild regurgitation. - Pulmonary arteries: PA peak pressure: 44 mm Hg (S). - Inferior vena cava: The vessel was dilated. The respirophasic diameter changes were blunted (< 50%), consistent with elevated central venous pressure. Impressions: - LVEF 35-40%, intraventricular dyssynchrony, diffuse hypokinesis, diastolic dysfunction, indeterminate LV filling pressure, normal atrial size, moderately dilated RV with hypokinesis, mild TR, RVSP 44 mmHg.   Cath 01/03/15 1. No angiographic evidence of CAD 2. Non-ischemic cardiomyopathy (LVEF obtained by echo).    Discharge Medications     Medication List    STOP taking these medications        digoxin 0.25 MG tablet  Commonly known as:  LANOXIN     diltiazem 300 MG 24 hr capsule  Commonly known as:   TIAZAC      TAKE these medications        albuterol 108 (90 BASE) MCG/ACT inhaler  Commonly known as:  PROVENTIL HFA;VENTOLIN HFA  Inhale 2 puffs into the lungs every 6 (six) hours as needed for wheezing or shortness of breath.     albuterol (2.5 MG/3ML) 0.083% nebulizer solution  Commonly known as:  PROVENTIL  Take 2.5 mg by nebulization every 6 (six) hours.     aspirin EC 81 MG tablet  Take 81 mg by mouth 2 (two) times daily.     atorvastatin 10 MG tablet  Commonly known as:  LIPITOR  Take 10 mg by mouth at bedtime.     budesonide-formoterol 160-4.5 MCG/ACT inhaler  Commonly known as:  SYMBICORT  Inhale 2 puffs into the lungs 2 (two) times daily.     famotidine 20 MG tablet  Commonly known as:  PEPCID  Take 1 tablet (20 mg total) by mouth at bedtime.     fluticasone 50  MCG/ACT nasal spray  Commonly known as:  FLONASE  Place 2 sprays into both nostrils 2 (two) times daily.     furosemide 80 MG tablet  Commonly known as:  LASIX  Take 1 tablet (80 mg total) by mouth 2 (two) times daily.     HYDROcodone-acetaminophen 10-325 MG tablet  Commonly known as:  NORCO  Take 1 tablet by mouth 3 (three) times daily.     insulin aspart 100 UNIT/ML injection  Commonly known as:  novoLOG  Inject 3-8 Units into the skin 3 (three) times daily before meals. 150-200=3 units      anything >300=8 units     insulin glargine 100 UNIT/ML injection  Commonly known as:  LANTUS  Inject 0.15 mLs (15 Units total) into the skin at bedtime.     lisinopril 20 MG tablet  Commonly known as:  PRINIVIL,ZESTRIL  Take 20 mg by mouth at bedtime.     metFORMIN 1000 MG tablet  Commonly known as:  GLUCOPHAGE  Take 1 tablet (1,000 mg total) by mouth 2 (two) times daily with a meal.  Start taking on:  01/06/2015     methocarbamol 750 MG tablet  Commonly known as:  ROBAXIN  Take 750 mg by mouth 3 (three) times daily.     metoprolol succinate 25 MG 24 hr tablet  Commonly known as:  TOPROL-XL  Take 1  tablet (25 mg total) by mouth daily.  Start taking on:  01/06/2015     OXYGEN  Inhale 4 L into the lungs continuous.     rOPINIRole 1 MG tablet  Commonly known as:  REQUIP  Take 1 mg by mouth at bedtime.     sertraline 50 MG tablet  Commonly known as:  ZOLOFT  Take 50 mg by mouth daily.     traZODone 50 MG tablet  Commonly known as:  DESYREL  Take 75 mg by mouth at bedtime.        Disposition   The patient will be discharged in stable condition to home.  Follow-up Information    Follow up with Janetta Hora, PA-C On 01/12/2015.   Specialties:  Cardiology, Radiology   Why:  @ 10:30am   Contact information:   [address] [place] [address] [phone]       Follow up with Lewayne Bunting, MD On 02/09/2015.   Specialty:  Cardiology   Why:  at 9AM   Contact information:   [address] [place] [address] [phone]       Follow up with [name], NP.   Specialty:  Nurse Practitioner   Why:  Please follow up with your primary MD about your blood sugars and diabetes medications.   Contact information:   [address] [place] [address] [phone]         Duration of Discharge Encounter: Greater than 30 minutes including physician and PA time.  Shari Heritage, Gesselle Fitzsimons R PA-C 01/05/2015, 2:15 PM

## 2015-01-05 NOTE — Discharge Instructions (Signed)

## 2015-01-05 NOTE — Progress Notes (Signed)
Patient Name: [name] Date of Encounter: 01/05/2015     Principal Problem:   Chest pain with high risk for cardiac etiology Active Problems:   Obesity   GERD (gastroesophageal reflux disease)   Hyperlipidemia   Diabetes mellitus type 2 in obese (HCC)   Hypertension   Hypercapnic respiratory failure, chronic (HCC)   Pacemaker - Medtronic, inserted 2014   Anxiety state   Normal coronary arteries 01/03/15   Non-ischemic cardiomyopathy - EF 35-40%   Acute combined systolic and diastolic heart failure (HCC)    SUBJECTIVE  No CP or SOB. Ready to go home.    CURRENT MEDS . albuterol  2.5 mg Nebulization TID  . aspirin EC  81 mg Oral BID  . atorvastatin  10 mg Oral QHS  . budesonide-formoterol  2 puff Inhalation BID  . famotidine  20 mg Oral QHS  . fluticasone  2 spray Each Nare BID  . furosemide  80 mg Oral BID  . HYDROcodone-acetaminophen  1 tablet Oral TID  . insulin aspart  0-20 Units Subcutaneous TID WC  . insulin aspart  0-5 Units Subcutaneous QHS  . insulin glargine  15 Units Subcutaneous QHS  . lisinopril  20 mg Oral Daily  . metoprolol tartrate  12.5 mg Oral BID  . rOPINIRole  1 mg Oral QHS  . sertraline  50 mg Oral Daily  . sodium chloride  3 mL Intravenous Q12H  . traZODone  75 mg Oral QHS    OBJECTIVE  Filed Vitals:   01/04/15 1531 01/04/15 2017 01/04/15 2100 01/05/15 0500  BP: 140/46  129/65 119/65  Pulse: 63 68 83 59  Temp: 98.5 F (36.9 C)  98.4 F (36.9 C) 97.8 F (36.6 C)  TempSrc: Oral  Oral Oral  Resp:  16 18   Height:      Weight:    215 lb 12.8 oz (97.886 kg)  SpO2: 98%  96% 98%    Intake/Output Summary (Last 24 hours) at 01/05/15 0716 Last data filed at 01/05/15 0548  Gross per 24 hour  Intake      0 ml  Output   2600 ml  Net  -2600 ml   Filed Weights   01/03/15 0330 01/04/15 0500 01/05/15 0500  Weight: 224 lb 6.4 oz (101.787 kg) 221 lb 12.8 oz (100.608 kg) 215 lb 12.8 oz (97.886 kg)    PHYSICAL EXAM  General:  Pleasant, NAD. Neuro: Alert and oriented X 3. Moves all extremities spontaneously. Psych: Normal affect. HEENT:  Normal  Neck: Supple without bruits or JVD. Lungs:  Resp regular and unlabored, CTA. Heart: RRR no s3, s4, or murmurs. Abdomen: Soft, non-tender, non-distended, BS + x 4.  Extremities: No clubbing, cyanosis or edema. DP/PT/Radials 2+ and equal bilaterally.  Accessory Clinical Findings  CBC  Recent Labs  01/02/15 0735 01/03/15 0353  WBC 5.8 5.7  HGB 10.5* 10.0*  HCT 34.1* 33.7*  MCV 93.4 93.6  PLT 169 171   Basic Metabolic Panel  Recent Labs  01/03/15 0353 01/05/15 0308  NA 140 135  K 4.8 4.0  CL 101 91*  CO2 33* 38*  GLUCOSE 350* 321*  BUN 12 16  CREATININE 0.91 0.98  CALCIUM 8.4* 8.6*    TELE  Intermittently A paced and AV paced.   Radiology/Studies  Dg Chest Port 1 View  12/31/2014  CLINICAL DATA:  Chest pain EXAM: PORTABLE CHEST 1 VIEW COMPARISON:  08/23/2014 FINDINGS: Moderate cardiomegaly. Dual lead left subclavian pacemaker device and leads  are stable and intact. Lungs are clear. Pulmonary vascularity is within normal limits. Vascular congestion previously noted has resolved. IMPRESSION: Cardiomegaly without decompensation. Electronically Signed   By: Jolaine Click M.D.   On: 12/31/2014 16:43   Ct Angio Chest Aorta W/cm &/or Wo/cm  12/31/2014  CLINICAL DATA:  Three-day history of chest pain and shortness of breath EXAM: CT ANGIOGRAPHY CHEST WITH CONTRAST TECHNIQUE: Initially, axial CT images were obtained through the chest without intravenous contrast material administration. Multidetector CT imaging of the chest was performed using the standard protocol during bolus administration of intravenous contrast. Multiplanar CT image reconstructions and MIPs were obtained to evaluate the vascular anatomy. CONTRAST:  OMNIPAQUE IOHEXOL 350 MG/ML SOLN COMPARISON:  Chest CT Jul 31, 2013 ; chest radiograph December 31, 2014 FINDINGS: There is no  demonstrable pulmonary embolus. There is no thoracic aortic aneurysm or dissection. The visualized great vessels appear unremarkable. Atherosclerotic calcification is noted aortic arch region. There is no edema or consolidation. There is mild bibasilar scarring. Pacemaker leads are attached to the right atrium and right ventricle. Pericardium is not thickened. There are multiple foci of coronary artery calcification. Stable nodular lesions are noted in the right lobe of the thyroid, largest measuring 1.0 x 0.9 cm. There is no demonstrable thoracic adenopathy. In the visualized upper abdomen, there is a posterior right-sided Bochdalek -type hernia. There is evidence of a degree of hepatic steatosis. Gallbladder is absent. Atherosclerotic calcification is noted in the upper abdominal aorta. There is degenerative change in the thoracic spine. There are no blastic or lytic bone lesions. Review of the MIP images confirms the above findings. IMPRESSION: No demonstrable pulmonary embolus. No thoracic aortic aneurysm or dissection. No lung edema or consolidation.  No adenopathy. Pacemaker leads attached to right atrium right ventricle. Extensive coronary artery calcification noted. Multinodular goiter, stable. Hepatic steatosis. Posterior Bochdalek type hernia on the right, stable. Electronically Signed   By: Bretta Bang III M.D.   On: 12/31/2014 19:32    Cardiac Studies: Echo 01/01/15 Study Conclusions - Left ventricle: The cavity size was normal. Wall thickness was increased in a pattern of mild LVH. Systolic function was moderately reduced. The estimated ejection fraction was in the range of 35% to 40%. Diffuse hypokinesis with incoordinate septal motion. Doppler parameters are consistent with abnormal left ventricular relaxation (grade 1 diastolic dysfunction). The E/e&' ratio is between 8-15, suggesting indeterminate LV filling pressure. - Ventricular septum: Septal motion showed  abnormal function and dyssynergy. - Mitral valve: Mildly thickened leaflets . There was trivial regurgitation. - Left atrium: The atrium was normal in size. - Right ventricle: The cavity size was moderately dilated. Wall thickness was normal. Systolic function is reduced. Lateral annulus peak S velocity: 9.7 cm/s. - Atrial septum: No defect or patent foramen ovale was identified. - Tricuspid valve: There was mild regurgitation. - Pulmonary arteries: PA peak pressure: 44 mm Hg (S). - Inferior vena cava: The vessel was dilated. The respirophasic diameter changes were blunted (< 50%), consistent with elevated central venous pressure. Impressions: - LVEF 35-40%, intraventricular dyssynchrony, diffuse hypokinesis, diastolic dysfunction, indeterminate LV filling pressure, normal atrial size, moderately dilated RV with hypokinesis, mild TR, RVSP 44 mmHg.   Cath 01/03/15 1. No angiographic evidence of CAD 2. Non-ischemic cardiomyopathy (LVEF obtained by echo).    ASSESSMENT AND PLAN  59 y.o. female obese w/ PMH of CHB (s/p MDT PPM 2014), COPD on chronic O2, GERD, DM, HTN, HLD, and a history of a NSTEMI 2/2 vasospasm in 2006  who was admitted 12/31/14 with chest pain concerning for for unstable angina.  Chest pain- now resolved.  -- CTA done in the ER showed no PE, dissection and no pulmonary edema and diffuse coronary calcifications.  -- Troponin neg x2.  -- Nuclear stress test in 10/2014 showed LVEF 44% with regional wall motion abnormalities. -- s/p LHC 01/03/15 with no angiographic evidence of CAD  Acute combined systolic and diastolic heart failure/ NICM - EF 35-40% -- s/p LHC with clean coronaries. 2D ECHO with EF 35-40%, intraventricular dyssynchrony, diffuse hypokinesis, diastolic dysfunction, indeterminate LV filling pressure, normal atrial size, moderately dilated RV with hypokinesis, mild TR,RVSP 44 mmHg.   -- Converted from IV lasix to PO Lasix  BID  last night. Net neg 6.6L. She diuresed -2.6 L yesterday/overnight.  -- Continue lisinopril  and Toprol XL  as well as lasix  BID -- ? If pacemaker can be optimized to minimize ventriclar dyssynchrony and improve CHF? Will have EP see.   DMT2- HgbA1C is 7.8%. Diabetes consult who recommended adding basal lantus.  -- Her Metformin was held on admission. This will be held 48 hours post cardiac cath. Okay to resume tomorrow AM. Her kidney function has normalized.   -- She will follow up with her PCP this week.   Hypercapnic respiratory failure, chronic- continue home 02.  Hypertension- BP better controlled. Continue lisinopril  and Toprol XL   Obesity- diet and exercise recommended  GERD- cont PPI  HLD- continue statin   Hx of CHB s/p PPM - Medtronic, inserted 2014    Signed, Cameryn Schum R PA-C  Pager [phone]

## 2015-01-05 NOTE — Progress Notes (Signed)
Recent device interrogation reviewed. Pt only A paces rarely and never V paces. Histograms appropriate.  For now, her device is optimized.  Will arrange outpatient EP follow up with Dr Ladona Ridgel.  Gypsy Balsam, NP 01/05/2015 10:27 AM

## 2015-01-05 NOTE — Progress Notes (Signed)
Results for ARIONNE, PHARRIS (MRN [id number]) as of 01/05/2015 11:08  Ref. Range 01/04/2015 07:28 01/04/2015 11:42 01/04/2015 16:55 01/04/2015 22:04 01/05/2015 07:28  Glucose-Capillary Latest Ref Range: 65-99 mg/dL 165 (H) 790 (H) 383 (H) 354 (H) 288 (H)  Noted that CBGs still greater than 180 mg/dl. Recommend increasing Lantus to 20 units daily if CBGs continue to be elevated. Continue Novolog correction scale TID & HS. Will continue to monitor blood sugars while in hospital. Smith Mince RN BSN CDE

## 2015-01-05 NOTE — Telephone Encounter (Signed)
New message      TCM appt on 01-12-15 per Katie.

## 2015-01-06 ENCOUNTER — Telehealth: Payer: Self-pay | Admitting: Cardiology

## 2015-01-06 ENCOUNTER — Ambulatory Visit (INDEPENDENT_AMBULATORY_CARE_PROVIDER_SITE_OTHER): Payer: Medicare HMO | Admitting: *Deleted

## 2015-01-06 DIAGNOSIS — I4891 Unspecified atrial fibrillation: Secondary | ICD-10-CM

## 2015-01-06 NOTE — Telephone Encounter (Signed)
Spoke with pt and reminded pt of remote transmission that is due today. Pt verbalized understanding.   

## 2015-01-06 NOTE — Telephone Encounter (Signed)
Patient contacted regarding discharge from [facility] on 01/05/15.  Patient understands to follow up with provider Cline Crock on 01/12/15 at 10:30 am at [facility]. Office. Patient understands her discharge instructions. Patient understands her medications and regimen. Patient understands to bring all of her medications to this visit.

## 2015-01-06 NOTE — Telephone Encounter (Signed)
Left message on machine for pt to contact the office.   

## 2015-01-07 ENCOUNTER — Telehealth: Payer: Self-pay | Admitting: *Deleted

## 2015-01-07 ENCOUNTER — Other Ambulatory Visit: Payer: Self-pay | Admitting: *Deleted

## 2015-01-07 NOTE — Progress Notes (Signed)
Remote pacemaker transmission.   

## 2015-01-07 NOTE — Telephone Encounter (Signed)
[facility] pharmacy contacted and they will cancel rx.

## 2015-01-07 NOTE — Telephone Encounter (Signed)
Received clarification from [facility] pharmacy for lantus that was prescribed for patient by Dorathy Kinsman, PA at hospital discharge. She asked that I disregard the fax from [facility] as she was unsure of the cost and also because her pcp has prescribed novalog and levemir. She is aware that our office will not be following this and all refills need to be acquired from her pcp. She thanked me for the call.

## 2015-01-12 ENCOUNTER — Encounter: Payer: Medicare HMO | Admitting: Physician Assistant

## 2015-01-12 NOTE — Progress Notes (Signed)
This encounter was created in error - please disregard.

## 2015-01-13 ENCOUNTER — Encounter: Payer: Medicare HMO | Admitting: Physician Assistant

## 2015-01-26 ENCOUNTER — Encounter: Payer: Self-pay | Admitting: Cardiology

## 2015-01-26 LAB — CUP PACEART REMOTE DEVICE CHECK
Battery Remaining Longevity: 140 mo
Brady Statistic AP VP Percent: 13 %
Brady Statistic AP VS Percent: 7 %
Brady Statistic AS VP Percent: 1 %
Implantable Lead Implant Date: 20140814
Implantable Lead Implant Date: 20140814
Implantable Lead Location: 753860
Implantable Lead Model: 5076
Lead Channel Impedance Value: 484 Ohm
Lead Channel Impedance Value: 553 Ohm
Lead Channel Pacing Threshold Amplitude: 0.75 V
Lead Channel Pacing Threshold Amplitude: 0.875 V
Lead Channel Pacing Threshold Pulse Width: 0.4 ms
Lead Channel Pacing Threshold Pulse Width: 0.4 ms
Lead Channel Sensing Intrinsic Amplitude: 11.2 mV
Lead Channel Setting Pacing Amplitude: 2.5 V
Lead Channel Setting Pacing Pulse Width: 0.4 ms
MDC IDC LEAD LOCATION: 753859
MDC IDC MSMT BATTERY IMPEDANCE: 156 Ohm
MDC IDC MSMT BATTERY VOLTAGE: 2.79 V
MDC IDC MSMT LEADCHNL RA SENSING INTR AMPL: 1.4 mV
MDC IDC SESS DTM: 20161103182458
MDC IDC SET LEADCHNL RA PACING AMPLITUDE: 2 V
MDC IDC SET LEADCHNL RV SENSING SENSITIVITY: 4 mV
MDC IDC STAT BRADY AS VS PERCENT: 80 %

## 2015-02-09 ENCOUNTER — Encounter: Payer: Medicare HMO | Admitting: Internal Medicine

## 2015-02-09 NOTE — Progress Notes (Signed)
HPI [name] returns today for followup. She is a very pleasant 59 year old woman with a history of symptomatic bradycardia secondary to complete heart block, status post permanent pacemaker insertion, who had a very extensive and prolonged hospital course over the last year resulting in tracheostomy placement with eventual removal, COPD, atrial fibrillation, hypertension, non-ST elevation MI, and previously uncontrolled diabetes. She has class II dyspnea. She has had no syncope. She is now back on home oxygen therapy. Allergies  Allergen Reactions  . Ambien [Zolpidem Tartrate] Other (See Comments)    Severe hallucinations and behavior changes reported by family  . Ciprofloxacin     PATIENT AT VERY HIGH RISK FOR C DIFFICILE COLITIS  . Actos [Pioglitazone] Swelling  . Bydureon [Exenatide]     Stomach pain  . Lunesta [Eszopiclone] Other (See Comments)    Nightmares & hallucinations  . Morphine And Related Itching  . Protonix [Pantoprazole Sodium] Other (See Comments)    Unknown reaction-"Doctor told me to never take it"     Current Outpatient Prescriptions  Medication Sig Dispense Refill  . albuterol (PROVENTIL HFA;VENTOLIN HFA) 108 (90 BASE) MCG/ACT inhaler Inhale 2 puffs into the lungs every 6 (six) hours as needed for wheezing or shortness of breath.    Marland Kitchen albuterol (PROVENTIL) (2.5 MG/3ML) 0.083% nebulizer solution Take 2.5 mg by nebulization every 6 (six) hours.    Marland Kitchen aspirin EC 81 MG tablet Take 81 mg by mouth 2 (two) times daily.    Marland Kitchen atorvastatin (LIPITOR) 10 MG tablet Take 10 mg by mouth at bedtime.    . budesonide-formoterol (SYMBICORT) 160-4.5 MCG/ACT inhaler Inhale 2 puffs into the lungs 2 (two) times daily.    . famotidine (PEPCID) 20 MG tablet Take 1 tablet (20 mg total) by mouth at bedtime. 30 tablet 1  . fluticasone (FLONASE) 50 MCG/ACT nasal spray Place 2 sprays into both nostrils 2 (two) times daily.     . furosemide (LASIX) 80 MG tablet Take 1 tablet (80 mg  total) by mouth 2 (two) times daily. 60 tablet 6  . HYDROcodone-acetaminophen (NORCO) 10-325 MG per tablet Take 1 tablet by mouth 3 (three) times daily.     . insulin aspart (NOVOLOG) 100 UNIT/ML injection Inject 3-8 Units into the skin 3 (three) times daily before meals. 150-200=3 units      anything >300=8 units    . lisinopril (PRINIVIL,ZESTRIL) 20 MG tablet Take 20 mg by mouth at bedtime.     . metFORMIN (GLUCOPHAGE) 1000 MG tablet Take 1 tablet (1,000 mg total) by mouth 2 (two) times daily with a meal. 60 tablet 3  . methocarbamol (ROBAXIN) 750 MG tablet Take 750 mg by mouth 3 (three) times daily.     . metoprolol succinate (TOPROL-XL) 25 MG 24 hr tablet Take 1 tablet (25 mg total) by mouth daily. 30 tablet 1  . OXYGEN Inhale 4 L into the lungs continuous.    Marland Kitchen rOPINIRole (REQUIP) 1 MG tablet Take 1 mg by mouth at bedtime.    . sertraline (ZOLOFT) 50 MG tablet Take 50 mg by mouth daily.    . traZODone (DESYREL) 50 MG tablet Take 75 mg by mouth at bedtime.      No current facility-administered medications for this visit.     Past Medical History  Diagnosis Date  . COPD (chronic obstructive pulmonary disease) (HCC)   . Chronic respiratory failure (HCC)   . Morbid obesity (HCC)   . Tobacco abuse   . GERD (gastroesophageal  reflux disease)   . Hyperlipidemia   . Chronic pain   . Hypertension   . Coronary vasospasm (HCC)     a. 03/2004 NSTEMI/Cath: EF 45%, LM nl, LAD nl, D1 nl, D2 nl, LCX nl, OM1 large-nl, RCA-spasm noted, otw nl.  . Diabetes mellitus type 2 in obese (HCC)   . HTN (hypertension)   . 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 recurrent in setting of abx noncompliance.  . Chronic combined systolic (congestive) and diastolic (congestive) heart failure (HCC)     a. 2D ECHO with EF 35-40%, intraventricular dyssynchrony, diffuse hypokinesis, diastolic dysfunction, indeterminate LV filling pressure, normal atrial size, moderately dilated RV with hypokinesis, mild TR,RVSP 44 mmHg.     Marland Kitchen History of complete heart block     a. s/p Medtronic PPM   . Chest pain     a. s/p LHC 01/03/15 with no angiographic evidence of CAD  . PAF (paroxysmal atrial fibrillation) (HCC)     ROS:   All systems reviewed and negative except as noted in the HPI.   Past Surgical History  Procedure Laterality Date  . Cholecystectomy    . Partial hysterectomy    . Angioplasty    . Umbilical hernia repair  12/2009  . Permanent pacemaker insertion N/A 10/16/2012    Procedure: PERMANENT PACEMAKER INSERTION;  Surgeon: Marinus Maw, MD;  Medtronic Adapta  . Cardiac catheterization N/A 01/03/2015    Procedure: Left Heart Cath and Coronary Angiography;  Surgeon: Kathleene Hazel, MD;  Location: [facility] INVASIVE CV LAB;  Service: Cardiovascular;  Laterality: N/A;     Family History  Problem Relation Age of Onset  . Other Mother     borderline diabetic  . COPD Mother   . Heart Problems Father   . Cancer Father     lung  . Heart Problems Maternal Aunt   . Heart Problems Maternal Uncle   . Heart Problems Maternal Grandmother   . Heart failure Maternal Grandmother   . Heart Problems Maternal Grandfather   . Heart Problems Paternal Grandmother   . Heart Problems Paternal Grandfather   . Heart attack Daughter 7     Social History   Social History  . Marital Status: Widowed    Spouse Name: N/A  . Number of Children: 1  . Years of Education: N/A   Occupational History  . Environmental education officer   Social History Main Topics  . Smoking status: Former Smoker -- 2.00 packs/day for 40 years    Types: Cigarettes    Quit date: 03/04/2012  . Smokeless tobacco: Never Used  . Alcohol Use: No  . Drug Use: No  . Sexual Activity: Not on file   Other Topics Concern  . Not on file   Social History Narrative   Lives in [place] with her husband.  She does not routinely exercise.     There were no vitals taken for this visit.  Physical Exam:  Stable but chronically ill  appearing middle-age woman,NAD HEENT: Unremarkable Neck:  7 cm JVD, no thyromegally, old tracheostomy scar, well-healed Back:  No CVA tenderness Lungs:  Clear, except for scattered rales in the bases, with no wheezes, or rhonchi. Well-healed pacemaker incision HEART:  Regular rate rhythm, no murmurs, no rubs, no clicks Abd:  soft, positive bowel sounds, no organomegally, no rebound, no guarding Ext:  2 plus pulses, no edema, no cyanosis, no clubbing Skin:  No rashes no nodules Neuro:  CN II  through XII intact, motor grossly intact   DEVICE  Normal device function.  See PaceArt for details.   Assess/Plan: 

## 2015-02-11 ENCOUNTER — Ambulatory Visit: Payer: Medicare HMO

## 2015-02-18 ENCOUNTER — Encounter: Payer: Medicare HMO | Admitting: Internal Medicine

## 2015-03-08 ENCOUNTER — Other Ambulatory Visit: Payer: Self-pay | Admitting: Physician Assistant

## 2015-03-14 ENCOUNTER — Ambulatory Visit: Payer: Medicare HMO

## 2015-03-22 ENCOUNTER — Ambulatory Visit: Payer: Medicare HMO

## 2015-03-30 ENCOUNTER — Encounter: Payer: Medicare HMO | Admitting: Internal Medicine

## 2015-04-25 ENCOUNTER — Encounter (HOSPITAL_COMMUNITY): Payer: Self-pay | Admitting: Emergency Medicine

## 2015-04-25 ENCOUNTER — Emergency Department (HOSPITAL_COMMUNITY)
Admission: EM | Admit: 2015-04-25 | Discharge: 2015-04-26 | Disposition: A | Discharge status: Home / Self Care | Payer: Medicare HMO | Attending: Emergency Medicine | Admitting: Emergency Medicine

## 2015-04-25 ENCOUNTER — Emergency Department (HOSPITAL_COMMUNITY): Payer: Medicare HMO

## 2015-04-25 DIAGNOSIS — Z9889 Other specified postprocedural states: Secondary | ICD-10-CM | POA: Insufficient documentation

## 2015-04-25 DIAGNOSIS — I5042 Chronic combined systolic (congestive) and diastolic (congestive) heart failure: Secondary | ICD-10-CM | POA: Diagnosis not present

## 2015-04-25 DIAGNOSIS — Z7951 Long term (current) use of inhaled steroids: Secondary | ICD-10-CM | POA: Diagnosis not present

## 2015-04-25 DIAGNOSIS — K219 Gastro-esophageal reflux disease without esophagitis: Secondary | ICD-10-CM | POA: Insufficient documentation

## 2015-04-25 DIAGNOSIS — Z79899 Other long term (current) drug therapy: Secondary | ICD-10-CM | POA: Diagnosis not present

## 2015-04-25 DIAGNOSIS — E1165 Type 2 diabetes mellitus with hyperglycemia: Secondary | ICD-10-CM | POA: Insufficient documentation

## 2015-04-25 DIAGNOSIS — R739 Hyperglycemia, unspecified: Secondary | ICD-10-CM

## 2015-04-25 DIAGNOSIS — I10 Essential (primary) hypertension: Secondary | ICD-10-CM | POA: Diagnosis not present

## 2015-04-25 DIAGNOSIS — R0602 Shortness of breath: Secondary | ICD-10-CM | POA: Diagnosis present

## 2015-04-25 DIAGNOSIS — Z7982 Long term (current) use of aspirin: Secondary | ICD-10-CM | POA: Insufficient documentation

## 2015-04-25 DIAGNOSIS — G8929 Other chronic pain: Secondary | ICD-10-CM | POA: Diagnosis not present

## 2015-04-25 DIAGNOSIS — J441 Chronic obstructive pulmonary disease with (acute) exacerbation: Secondary | ICD-10-CM

## 2015-04-25 DIAGNOSIS — E785 Hyperlipidemia, unspecified: Secondary | ICD-10-CM | POA: Insufficient documentation

## 2015-04-25 DIAGNOSIS — Z87891 Personal history of nicotine dependence: Secondary | ICD-10-CM | POA: Diagnosis not present

## 2015-04-25 DIAGNOSIS — Z9861 Coronary angioplasty status: Secondary | ICD-10-CM | POA: Diagnosis not present

## 2015-04-25 DIAGNOSIS — D649 Anemia, unspecified: Secondary | ICD-10-CM | POA: Diagnosis not present

## 2015-04-25 DIAGNOSIS — I480 Paroxysmal atrial fibrillation: Secondary | ICD-10-CM | POA: Diagnosis not present

## 2015-04-25 DIAGNOSIS — Z8619 Personal history of other infectious and parasitic diseases: Secondary | ICD-10-CM | POA: Insufficient documentation

## 2015-04-25 DIAGNOSIS — Z794 Long term (current) use of insulin: Secondary | ICD-10-CM | POA: Insufficient documentation

## 2015-04-25 DIAGNOSIS — Z7984 Long term (current) use of oral hypoglycemic drugs: Secondary | ICD-10-CM | POA: Diagnosis not present

## 2015-04-25 LAB — CBC
HEMATOCRIT: 39.4 % (ref 36.0–46.0)
HEMOGLOBIN: 11.9 g/dL — AB (ref 12.0–15.0)
MCH: 28.3 pg (ref 26.0–34.0)
MCHC: 30.2 g/dL (ref 30.0–36.0)
MCV: 93.6 fL (ref 78.0–100.0)
Platelets: 302 10*3/uL (ref 150–400)
RBC: 4.21 MIL/uL (ref 3.87–5.11)
RDW: 13.8 % (ref 11.5–15.5)
WBC: 7 10*3/uL (ref 4.0–10.5)

## 2015-04-25 LAB — BASIC METABOLIC PANEL
ANION GAP: 17 — AB (ref 5–15)
BUN: 24 mg/dL — AB (ref 6–20)
CHLORIDE: 85 mmol/L — AB (ref 101–111)
CO2: 33 mmol/L — ABNORMAL HIGH (ref 22–32)
Calcium: 8.6 mg/dL — ABNORMAL LOW (ref 8.9–10.3)
Creatinine, Ser: 1.37 mg/dL — ABNORMAL HIGH (ref 0.44–1.00)
GFR calc Af Amer: 48 mL/min — ABNORMAL LOW (ref >60)
GFR, EST NON AFRICAN AMERICAN: 41 mL/min — AB (ref >60)
GLUCOSE: 409 mg/dL — AB (ref 65–99)
POTASSIUM: 3.5 mmol/L (ref 3.5–5.1)
Sodium: 135 mmol/L (ref 135–145)

## 2015-04-25 LAB — I-STAT TROPONIN, ED: Troponin i, poc: 0.02 ng/mL (ref 0.00–0.08)

## 2015-04-25 NOTE — ED Notes (Signed)
Pt from home for eval of sob with cp x3 weeks, saw PCP but states has gotten worse. Pt noted to have labored breathing with rhonchi and expiratory wheezing upon arrival to Ed. Pt states on home 02 at 4L, pt also noted to have COPD. Pt with congested cough but states unable to cough it up.

## 2015-04-26 ENCOUNTER — Encounter: Payer: Medicare HMO | Admitting: Internal Medicine

## 2015-04-26 MED ORDER — IPRATROPIUM-ALBUTEROL 0.5-2.5 (3) MG/3ML IN SOLN
3.0000 mL | Freq: Once | RESPIRATORY_TRACT | Status: AC
  Administered 2015-04-26: 3 mL via RESPIRATORY_TRACT
  Filled 2015-04-26: qty 3

## 2015-04-26 MED ORDER — PREDNISONE 20 MG PO TABS
60.0000 mg | ORAL_TABLET | Freq: Once | ORAL | Status: AC
  Administered 2015-04-26: 60 mg via ORAL
  Filled 2015-04-26: qty 3

## 2015-04-26 MED ORDER — PREDNISONE 50 MG PO TABS: 50.0000 mg | ORAL_TABLET | Freq: Every day | ORAL | Status: DC

## 2015-04-26 NOTE — Discharge Instructions (Signed)
Usually your albuterol and DuoNeb nebulizer every 4 hours for the next 2 days. Then resume your normal pattern. Return to the ED if you're getting worse. Also, beware that prednisone will make your sugar go higher. Monitor your sugar and adjust her diabetes medicines accordingly.  Chronic Obstructive Pulmonary Disease Chronic obstructive pulmonary disease (COPD) is a common lung condition in which airflow from the lungs is limited. COPD is a general term that can be used to describe many different lung problems that limit airflow, including both chronic bronchitis and emphysema. If you have COPD, your lung function will probably never return to normal, but there are measures you can take to improve lung function and make yourself feel better. CAUSES   Smoking (common).  Exposure to secondhand smoke.  Genetic problems.  Chronic inflammatory lung diseases or recurrent infections. SYMPTOMS  Shortness of breath, especially with physical activity.  Deep, persistent (chronic) cough with a large amount of thick mucus.  Wheezing.  Rapid breaths (tachypnea).  Gray or bluish discoloration (cyanosis) of the skin, especially in your fingers, toes, or lips.  Fatigue.  Weight loss.  Frequent infections or episodes when breathing symptoms become much worse (exacerbations).  Chest tightness. DIAGNOSIS Your health care provider will take a medical history and perform a physical examination to diagnose COPD. Additional tests for COPD may include:  Lung (pulmonary) function tests.  Chest X-ray.  CT scan.  Blood tests. TREATMENT  Treatment for COPD may include:  Inhaler and nebulizer medicines. These help manage the symptoms of COPD and make your breathing more comfortable.  Supplemental oxygen. Supplemental oxygen is only helpful if you have a low oxygen level in your blood.  Exercise and physical activity. These are beneficial for nearly all people with COPD.  Lung surgery or  transplant.  Nutrition therapy to gain weight, if you are underweight.  Pulmonary rehabilitation. This may involve working with a team of health care providers and specialists, such as respiratory, occupational, and physical therapists. HOME CARE INSTRUCTIONS  Take all medicines (inhaled or pills) as directed by your health care provider.  Avoid over-the-counter medicines or cough syrups that dry up your airway (such as antihistamines) and slow down the elimination of secretions unless instructed otherwise by your health care provider.  If you are a smoker, the most important thing that you can do is stop smoking. Continuing to smoke will cause further lung damage and breathing trouble. Ask your health care provider for help with quitting smoking. He or she can direct you to community resources or hospitals that provide support.  Avoid exposure to irritants such as smoke, chemicals, and fumes that aggravate your breathing.  Use oxygen therapy and pulmonary rehabilitation if directed by your health care provider. If you require home oxygen therapy, ask your health care provider whether you should purchase a pulse oximeter to measure your oxygen level at home.  Avoid contact with individuals who have a contagious illness.  Avoid extreme temperature and humidity changes.  Eat healthy foods. Eating smaller, more frequent meals and resting before meals may help you maintain your strength.  Stay active, but balance activity with periods of rest. Exercise and physical activity will help you maintain your ability to do things you want to do.  Preventing infection and hospitalization is very important when you have COPD. Make sure to receive all the vaccines your health care provider recommends, especially the pneumococcal and influenza vaccines. Ask your health care provider whether you need a pneumonia vaccine.  Learn and  use relaxation techniques to manage stress.  Learn and use controlled  breathing techniques as directed by your health care provider. Controlled breathing techniques include:  Pursed lip breathing. Start by breathing in (inhaling) through your nose for 1 second. Then, purse your lips as if you were going to whistle and breathe out (exhale) through the pursed lips for 2 seconds.  Diaphragmatic breathing. Start by putting one hand on your abdomen just above your waist. Inhale slowly through your nose. The hand on your abdomen should move out. Then purse your lips and exhale slowly. You should be able to feel the hand on your abdomen moving in as you exhale.  Learn and use controlled coughing to clear mucus from your lungs. Controlled coughing is a series of short, progressive coughs. The steps of controlled coughing are: 1. Lean your head slightly forward. 2. Breathe in deeply using diaphragmatic breathing. 3. Try to hold your breath for 3 seconds. 4. Keep your mouth slightly open while coughing twice. 5. Spit any mucus out into a tissue. 6. Rest and repeat the steps once or twice as needed. SEEK MEDICAL CARE IF:  You are coughing up more mucus than usual.  There is a change in the color or thickness of your mucus.  Your breathing is more labored than usual.  Your breathing is faster than usual. SEEK IMMEDIATE MEDICAL CARE IF:  You have shortness of breath while you are resting.  You have shortness of breath that prevents you from:  Being able to talk.  Performing your usual physical activities.  You have chest pain lasting longer than 5 minutes.  Your skin color is more cyanotic than usual.  You measure low oxygen saturations for longer than 5 minutes with a pulse oximeter. MAKE SURE YOU:  Understand these instructions.  Will watch your condition.  Will get help right away if you are not doing well or get worse.   This information is not intended to replace advice given to you by your health care provider. Make sure you discuss any questions  you have with your health care provider.   Document Released: 11/29/2004 Document Revised: 03/12/2014 Document Reviewed: 10/16/2012 Elsevier Interactive Patient Education 2016 [employer]  Prednisone tablets What is this medicine? PREDNISONE (PRED ni sone) is a corticosteroid. It is commonly used to treat inflammation of the skin, joints, lungs, and other organs. Common conditions treated include asthma, allergies, and arthritis. It is also used for other conditions, such as blood disorders and diseases of the adrenal glands. This medicine may be used for other purposes; ask your health care provider or pharmacist if you have questions. What should I tell my health care provider before I take this medicine? They need to know if you have any of these conditions: -Cushing's syndrome -diabetes -glaucoma -heart disease -high blood pressure -infection (especially a virus infection such as chickenpox, cold sores, or herpes) -kidney disease -liver disease -mental illness -myasthenia gravis -osteoporosis -seizures -stomach or intestine problems -thyroid disease -an unusual or allergic reaction to lactose, prednisone, other medicines, foods, dyes, or preservatives -pregnant or trying to get pregnant -breast-feeding How should I use this medicine? Take this medicine by mouth with a glass of water. Follow the directions on the prescription label. Take this medicine with food. If you are taking this medicine once a day, take it in the morning. Do not take more medicine than you are told to take. Do not suddenly stop taking your medicine because you may develop a severe reaction.  Your doctor will tell you how much medicine to take. If your doctor wants you to stop the medicine, the dose may be slowly lowered over time to avoid any side effects. Talk to your pediatrician regarding the use of this medicine in children. Special care may be needed. Overdosage: If you think you have taken too much of  this medicine contact a poison control center or emergency room at once. NOTE: This medicine is only for you. Do not share this medicine with others. What if I miss a dose? If you miss a dose, take it as soon as you can. If it is almost time for your next dose, talk to your doctor or health care professional. You may need to miss a dose or take an extra dose. Do not take double or extra doses without advice. What may interact with this medicine? Do not take this medicine with any of the following medications: -metyrapone -mifepristone This medicine may also interact with the following medications: -aminoglutethimide -amphotericin B -aspirin and aspirin-like medicines -barbiturates -certain medicines for diabetes, like glipizide or glyburide -cholestyramine -cholinesterase inhibitors -cyclosporine -digoxin -diuretics -ephedrine -female hormones, like estrogens and birth control pills -isoniazid -ketoconazole -NSAIDS, medicines for pain and inflammation, like ibuprofen or naproxen -phenytoin -rifampin -toxoids -vaccines -warfarin This list may not describe all possible interactions. Give your health care provider a list of all the medicines, herbs, non-prescription drugs, or dietary supplements you use. Also tell them if you smoke, drink alcohol, or use illegal drugs. Some items may interact with your medicine. What should I watch for while using this medicine? Visit your doctor or health care professional for regular checks on your progress. If you are taking this medicine over a prolonged period, carry an identification card with your name and address, the type and dose of your medicine, and your doctor's name and address. This medicine may increase your risk of getting an infection. Tell your doctor or health care professional if you are around anyone with measles or chickenpox, or if you develop sores or blisters that do not heal properly. If you are going to have surgery, tell  your doctor or health care professional that you have taken this medicine within the last twelve months. Ask your doctor or health care professional about your diet. You may need to lower the amount of salt you eat. This medicine may affect blood sugar levels. If you have diabetes, check with your doctor or health care professional before you change your diet or the dose of your diabetic medicine. What side effects may I notice from receiving this medicine? Side effects that you should report to your doctor or health care professional as soon as possible: -allergic reactions like skin rash, itching or hives, swelling of the face, lips, or tongue -changes in emotions or moods -changes in vision -depressed mood -eye pain -fever or chills, cough, sore throat, pain or difficulty passing urine -increased thirst -swelling of ankles, feet Side effects that usually do not require medical attention (report to your doctor or health care professional if they continue or are bothersome): -confusion, excitement, restlessness -headache -nausea, vomiting -skin problems, acne, thin and shiny skin -trouble sleeping -weight gain This list may not describe all possible side effects. Call your doctor for medical advice about side effects. You may report side effects to FDA at 1-800-FDA-1088. Where should I keep my medicine? Keep out of the reach of children. Store at room temperature between 15 and 30 degrees C (59 and 86 degrees F).  Protect from light. Keep container tightly closed. Throw away any unused medicine after the expiration date. NOTE: This sheet is a summary. It may not cover all possible information. If you have questions about this medicine, talk to your doctor, pharmacist, or health care provider.    2016, Elsevier/Gold Standard. (2010-10-05 10:57:14)

## 2015-04-26 NOTE — ED Provider Notes (Signed)
CSN: [id number]     Arrival date & time 04/25/15  2004 History  By signing my name below, I, Marisue Humble, attest that this documentation has been prepared under the direction and in the presence of Dione Booze, MD . Electronically Signed: Marisue Humble, Scribe. 04/26/2015. 3:03 AM.   Chief Complaint  Patient presents with  . Shortness of Breath  . Chest Pain   The history is provided by the patient. No language interpreter was used.   HPI Comments:  [name] is a 60 y.o. female with PMHx of COPD, CHF, HLD, HTN, and DM who presents to the Emergency Department complaining of worsening shortness of breath for the past month, worsened with any activity. Pt reports associated non-productive cough, congestion, and clear rhinorrhea. She reports using nebulizer and inhaler at home with moderate, temporary relief of symptoms. She states she saw her PCP last week and they diagnosed her with sinusitis. She was given a shot of prednisone at that time with temporary relief. Pt is a current non-smoker. She denies fever, chills, or any other symptoms at this time.   Past Medical History  Diagnosis Date  . COPD (chronic obstructive pulmonary disease) (HCC)   . Chronic respiratory failure (HCC)   . Morbid obesity (HCC)   . Tobacco abuse   . GERD (gastroesophageal reflux disease)   . Hyperlipidemia   . Chronic pain   . Hypertension   . Coronary vasospasm (HCC)     a. 03/2004 NSTEMI/Cath: EF 45%, LM nl, LAD nl, D1 nl, D2 nl, LCX nl, OM1 large-nl, RCA-spasm noted, otw nl.  . Diabetes mellitus type 2 in obese (HCC)   . HTN (hypertension)   . 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 recurrent in setting of abx noncompliance.  . Chronic combined systolic (congestive) and diastolic (congestive) heart failure (HCC)     a. 2D ECHO with EF 35-40%, intraventricular dyssynchrony, diffuse hypokinesis, diastolic dysfunction, indeterminate LV filling pressure, normal atrial size, moderately dilated RV with  hypokinesis, mild TR,RVSP 44 mmHg.    Marland Kitchen History of complete heart block     a. s/p Medtronic PPM   . Chest pain     a. s/p LHC 01/03/15 with no angiographic evidence of CAD  . PAF (paroxysmal atrial fibrillation) [facility])    Past Surgical History  Procedure Laterality Date  . Cholecystectomy    . Partial hysterectomy    . Angioplasty    . Umbilical hernia repair  12/2009  . Permanent pacemaker insertion N/A 10/16/2012    Procedure: PERMANENT PACEMAKER INSERTION;  Surgeon: Marinus Maw, MD;  Medtronic Adapta  . Cardiac catheterization N/A 01/03/2015    Procedure: Left Heart Cath and Coronary Angiography;  Surgeon: Kathleene Hazel, MD;  Location: [facility] INVASIVE CV LAB;  Service: Cardiovascular;  Laterality: N/A;   Family History  Problem Relation Age of Onset  . Other Mother     borderline diabetic  . COPD Mother   . Heart Problems Father   . Cancer Father     lung  . Heart Problems Maternal Aunt   . Heart Problems Maternal Uncle   . Heart Problems Maternal Grandmother   . Heart failure Maternal Grandmother   . Heart Problems Maternal Grandfather   . Heart Problems Paternal Grandmother   . Heart Problems Paternal Grandfather   . Heart attack Daughter 49   Social History  Substance Use Topics  . Smoking status: Former Smoker -- 2.00 packs/day for 40 years  Types: Cigarettes    Quit date: 03/04/2012  . Smokeless tobacco: Never Used  . Alcohol Use: No   OB History    No data available     Review of Systems  Constitutional: Negative for fever and chills.  HENT: Positive for congestion and rhinorrhea.   Respiratory: Positive for cough and shortness of breath.   All other systems reviewed and are negative.   Allergies  Ambien; Ciprofloxacin; Actos; Bydureon; Lunesta; Morphine and related; and Protonix  Home Medications   Prior to Admission medications   Medication Sig Start Date End Date Taking? Authorizing Provider  albuterol (PROVENTIL HFA;VENTOLIN HFA) 108  (90 BASE) MCG/ACT inhaler Inhale 2 puffs into the lungs every 6 (six) hours as needed for wheezing or shortness of breath.   Yes Historical Provider, MD  albuterol (PROVENTIL) (2.5 MG/3ML) 0.083% nebulizer solution Take 2.5 mg by nebulization every 6 (six) hours.   Yes Historical Provider, MD  aspirin EC 81 MG tablet Take 81 mg by mouth 2 (two) times daily.   Yes Historical Provider, MD  atorvastatin (LIPITOR) 10 MG tablet Take 10 mg by mouth at bedtime.   Yes Historical Provider, MD  budesonide-formoterol (SYMBICORT) 160-4.5 MCG/ACT inhaler Inhale 2 puffs into the lungs 2 (two) times daily.   Yes Historical Provider, MD  famotidine (PEPCID) 20 MG tablet Take 1 tablet (20 mg total) by mouth at bedtime. 09/16/12  Yes Vassie Loll, MD  fluticasone [facility]) 50 MCG/ACT nasal spray Place 2 sprays into both nostrils 2 (two) times daily.    Yes Historical Provider, MD  furosemide (LASIX) 80 MG tablet Take 1 tablet (80 mg total) by mouth 2 (two) times daily. 01/05/15  Yes Janetta Hora, PA-C  HYDROcodone-acetaminophen (NORCO) 10-325 MG per tablet Take 1 tablet by mouth 3 (three) times daily as needed for moderate pain.  10/06/12  Yes Historical Provider, MD  insulin aspart (NOVOLOG) 100 UNIT/ML injection Inject 3-8 Units into the skin 3 (three) times daily as needed for high blood sugar. 150-200=3 units      anything >300=8 units   Yes Historical Provider, MD  metFORMIN (GLUCOPHAGE) 1000 MG tablet Take 1 tablet (1,000 mg total) by mouth 2 (two) times daily with a meal. 01/06/15  Yes Janetta Hora, PA-C  methocarbamol (ROBAXIN) 750 MG tablet Take 750 mg by mouth 3 (three) times daily.    Yes Historical Provider, MD  metoprolol succinate (TOPROL-XL) 25 MG 24 hr tablet Take 1 tablet (25 mg total) by mouth daily. 01/06/15  Yes Janetta Hora, PA-C  OXYGEN Inhale 4 L into the lungs continuous.   Yes Historical Provider, MD  rOPINIRole (REQUIP) 1 MG tablet Take 1 mg by mouth at bedtime.   Yes Historical  Provider, MD  sertraline (ZOLOFT) 50 MG tablet Take 50 mg by mouth daily.   Yes Historical Provider, MD  traZODone (DESYREL) 50 MG tablet Take 300 mg by mouth at bedtime.    Yes Historical Provider, MD   BP 163/94 mmHg  Pulse 99  Temp(Src) 98.7 F (37.1 C) (Oral)  Resp 11  Ht 5\' 6"  (1.676 m)  Wt 210 lb (95.255 kg)  BMI 33.91 kg/m2  SpO2 97% Physical Exam  Constitutional: She is oriented to person, place, and time. She appears well-developed and well-nourished. No distress.  HENT:  Head: Normocephalic and atraumatic.  Right Ear: Hearing normal.  Left Ear: Hearing normal.  Nose: Nose normal.  Mouth/Throat: Oropharynx is clear and moist and mucous membranes are normal.  Eyes:  Conjunctivae and EOM are normal. Pupils are equal, round, and reactive to light.  Neck: Normal range of motion. Neck supple. No JVD present.  Cardiovascular: Normal rate, S1 normal and S2 normal.  Exam reveals no gallop and no friction rub.   No murmur heard. Pulmonary/Chest: Effort normal. She has wheezes. She has no rales. She exhibits no tenderness.  Diffuse expiratory wheezes   Abdominal: Soft. Normal appearance and bowel sounds are normal. There is no hepatosplenomegaly. There is no tenderness. There is no rebound, no guarding, no tenderness at McBurney's point and negative Murphy's sign. No hernia.  Musculoskeletal: Normal range of motion. She exhibits edema.  Trace pitting edema BL  Lymphadenopathy:    She has no cervical adenopathy.  Neurological: She is alert and oriented to person, place, and time. She has normal strength. No cranial nerve deficit or sensory deficit. She exhibits normal muscle tone. Coordination normal. GCS eye subscore is 4. GCS verbal subscore is 5. GCS motor subscore is 6.  Skin: Skin is warm, dry and intact. No rash noted. No cyanosis.  Psychiatric: She has a normal mood and affect. Her speech is normal and behavior is normal. Judgment and thought content normal.  Nursing note and  vitals reviewed.   ED Course  Procedures  DIAGNOSTIC STUDIES:  Oxygen Saturation is 97% on 4L/min [place], normal by my interpretation.    COORDINATION OF CARE:  2:58 AM Will administer nebulizing treatment and steroid. Discussed treatment plan with pt at bedside and pt agreed to plan.  Labs Review Results for orders placed or performed during the hospital encounter of 04/25/15  Basic metabolic panel  Result Value Ref Range   Sodium 135 135 - 145 mmol/L   Potassium 3.5 3.5 - 5.1 mmol/L   Chloride 85 (L) 101 - 111 mmol/L   CO2 33 (H) 22 - 32 mmol/L   Glucose, Bld 409 (H) 65 - 99 mg/dL   BUN 24 (H) 6 - 20 mg/dL   Creatinine, Ser 1.61 (H) 0.44 - 1.00 mg/dL   Calcium 8.6 (L) 8.9 - 10.3 mg/dL   GFR calc non Af Amer 41 (L) >60 mL/min   GFR calc Af Amer 48 (L) >60 mL/min   Anion gap 17 (H) 5 - 15  CBC  Result Value Ref Range   WBC 7.0 4.0 - 10.5 K/uL   RBC 4.21 3.87 - 5.11 MIL/uL   Hemoglobin 11.9 (L) 12.0 - 15.0 g/dL   HCT 09.6 04.5 - 40.9 %   MCV 93.6 78.0 - 100.0 fL   MCH 28.3 26.0 - 34.0 pg   MCHC 30.2 30.0 - 36.0 g/dL   RDW 81.1 91.4 - 78.2 %   Platelets 302 150 - 400 K/uL  I-stat troponin, ED (not at [facility], [facility])  Result Value Ref Range   Troponin i, poc 0.02 0.00 - 0.08 ng/mL   Comment 3           Imaging Review Dg Chest 2 View  04/25/2015  CLINICAL DATA:  Three week history of cough, chest pain and shortness of breath with expiratory wheezing. Oxygen dependent COPD. EXAM: CHEST  2 VIEW COMPARISON:  Chest x-ray and CTA chest 10/20 10/1014 and earlier. FINDINGS: Suboptimal inspiration accounts for crowded bronchovascular markings, especially in the bases, and accentuates the cardiac silhouette. Taking this into account, cardiac silhouette mildly enlarged, unchanged. Left subclavian dual lead transvenous pacemaker unchanged. Lungs clear. Bronchovascular markings normal. Pulmonary vascularity normal. No visible pleural effusions. No pneumothorax. Degenerative changes involving the  thoracic spine. IMPRESSION: Suboptimal inspiration. Stable cardiomegaly. No acute cardiopulmonary disease. Electronically Signed   By: Hulan Saas M.D.   On: 04/25/2015 21:12   I have personally reviewed and evaluated these images and lab results as part of my medical decision-making.   EKG Interpretation   Date/Time:  Monday April 25 2015 20:12:40 EST Ventricular Rate:  96 PR Interval:  168 QRS Duration: 166 QT Interval:  430 QTC Calculation: 543 R Axis:   -85 Text Interpretation:  Normal sinus rhythm Left axis deviation Right bundle  branch block Left ventricular hypertrophy with repolarization abnormality  Abnormal ECG Confirmed by Lincoln Brigham [phone]) on 04/25/2015 9:26:19 PM     MDM   Final diagnoses:  COPD exacerbation (HCC)  Normochromic normocytic anemia  Hyperglycemia    COPD exacerbation. Chest x-ray shows no evidence of pneumonia. She's given a nebulizer treatment with albuterol and ipratropium with significant improvement. On reexam, lungs are almost clear. She's given a second nebulizer treatment and lungs are almost completely clear. There is faint residual wheeze which patient states that she always have. She is feeling like she is back to her baseline. Laboratory workup does show some degree of hyperglycemia and she is advised that she will need to adjust her insulin doses because she will be on steroids for the next several days. She is given a dose of prednisone in the ED and sent home with prescription for prednisone. Follow-up with PCP. Return should symptoms worsen. Old records were reviewed showing that she has had hospitalizations for COPD in the past. She also had a cardiac catheterization in October 2016 showing no coronary artery disease but echocardiogram did show decreased ejection fraction and was diagnosed with nonischemic cardiomyopathy.  I personally performed the services described in this documentation, which was scribed in my presence. The recorded  information has been reviewed and is accurate.      Dione Booze, MD 04/26/15 [phone]

## 2015-05-19 ENCOUNTER — Other Ambulatory Visit: Payer: Self-pay | Admitting: Physician Assistant

## 2015-06-21 IMAGING — CR DG CHEST 1V PORT
1 series · 1 of 1 positions shown · non-contrast
Comparison: 04/08/2012 and earlier.

CLINICAL DATA: 57-year-old female with respiratory failure.
Diarrhea.

PORTABLE CHEST - 1 VIEW

[AP]
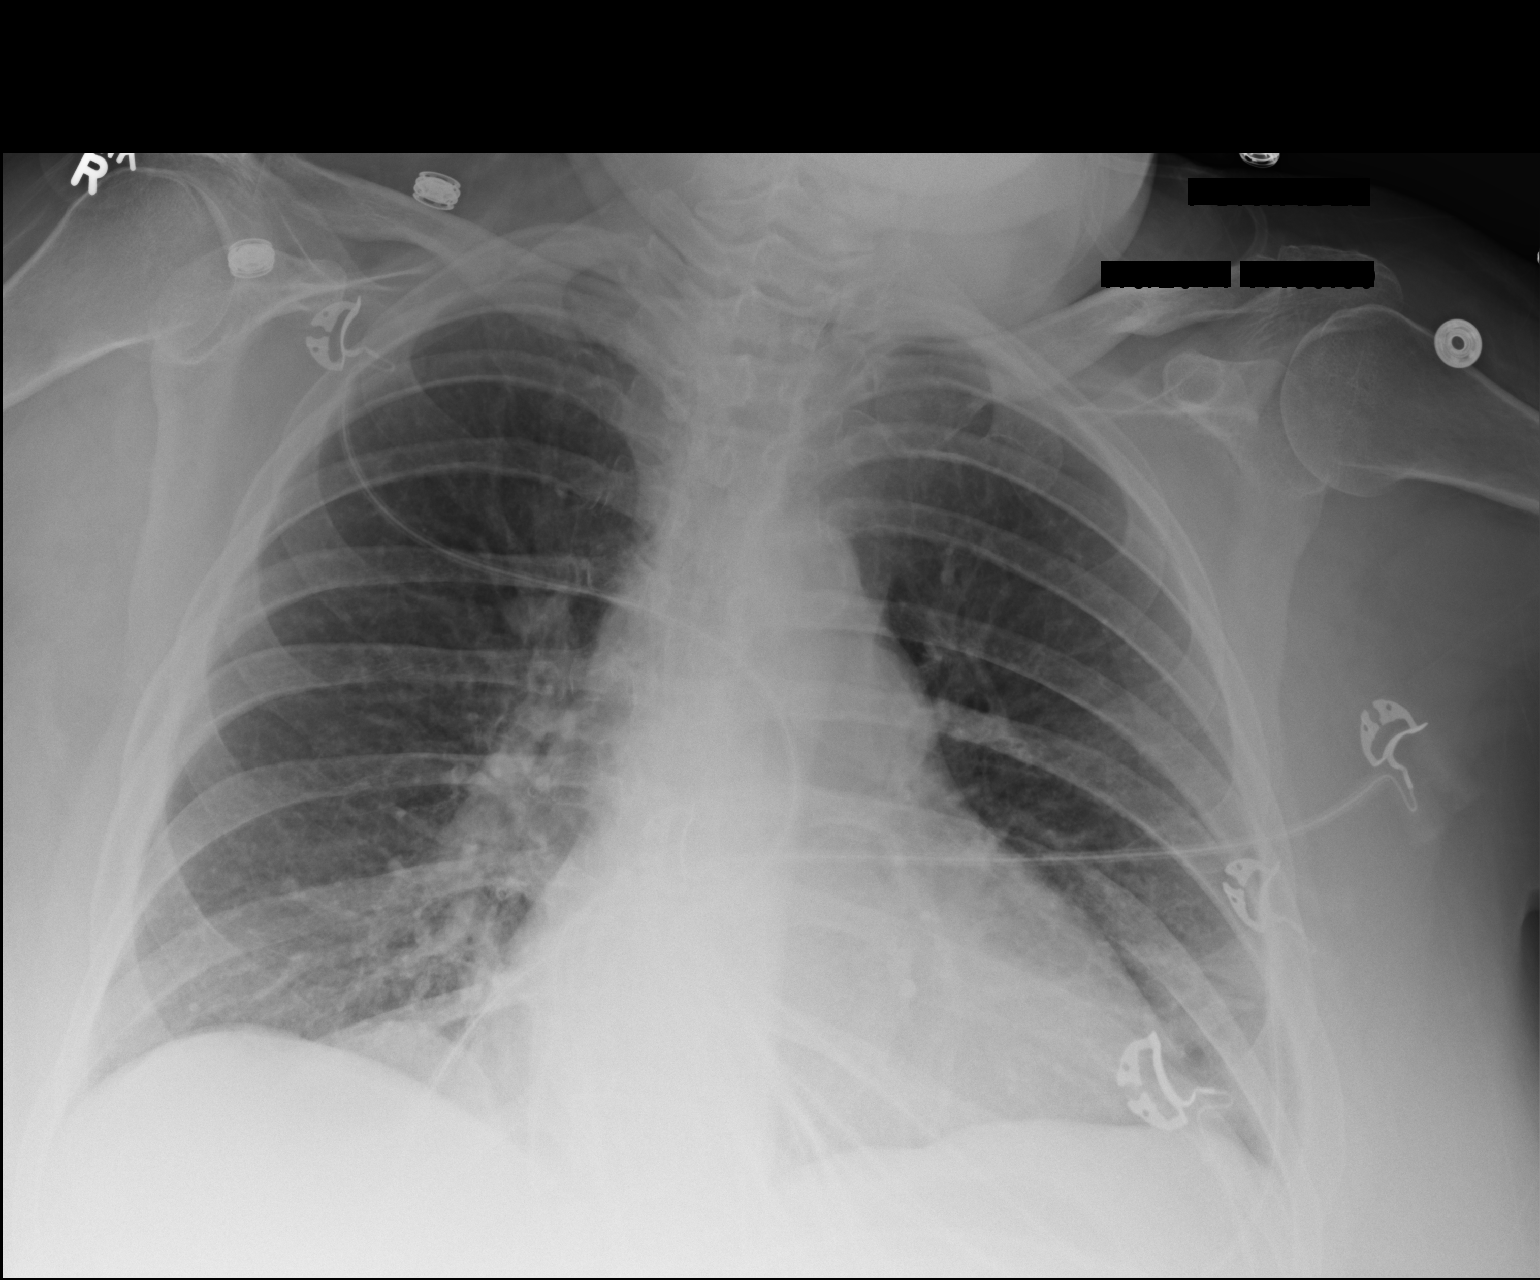

[1 of 1 positions shown; findings below may reference images not displayed]

FINDINGS: Semi upright AP portable view 5680 hours.  Stable lung
volumes.  Stable cardiac size and mediastinal contours.  No
pneumothorax or pulmonary edema.  No pleural effusion or confluent
pulmonary opacity.  Stable mild contour deformity associated with a
small fat containing right diaphragmatic hernia (see 03/24/2012
CTA).
IMPRESSION: No acute cardiopulmonary abnormality.

## 2015-06-26 IMAGING — CR DG CHEST 1V PORT
1 series · 1 of 1 positions shown · non-contrast
Comparison: 09/11/2012

CLINICAL DATA: Shortness of breath

PORTABLE CHEST - 1 VIEW

[AP]
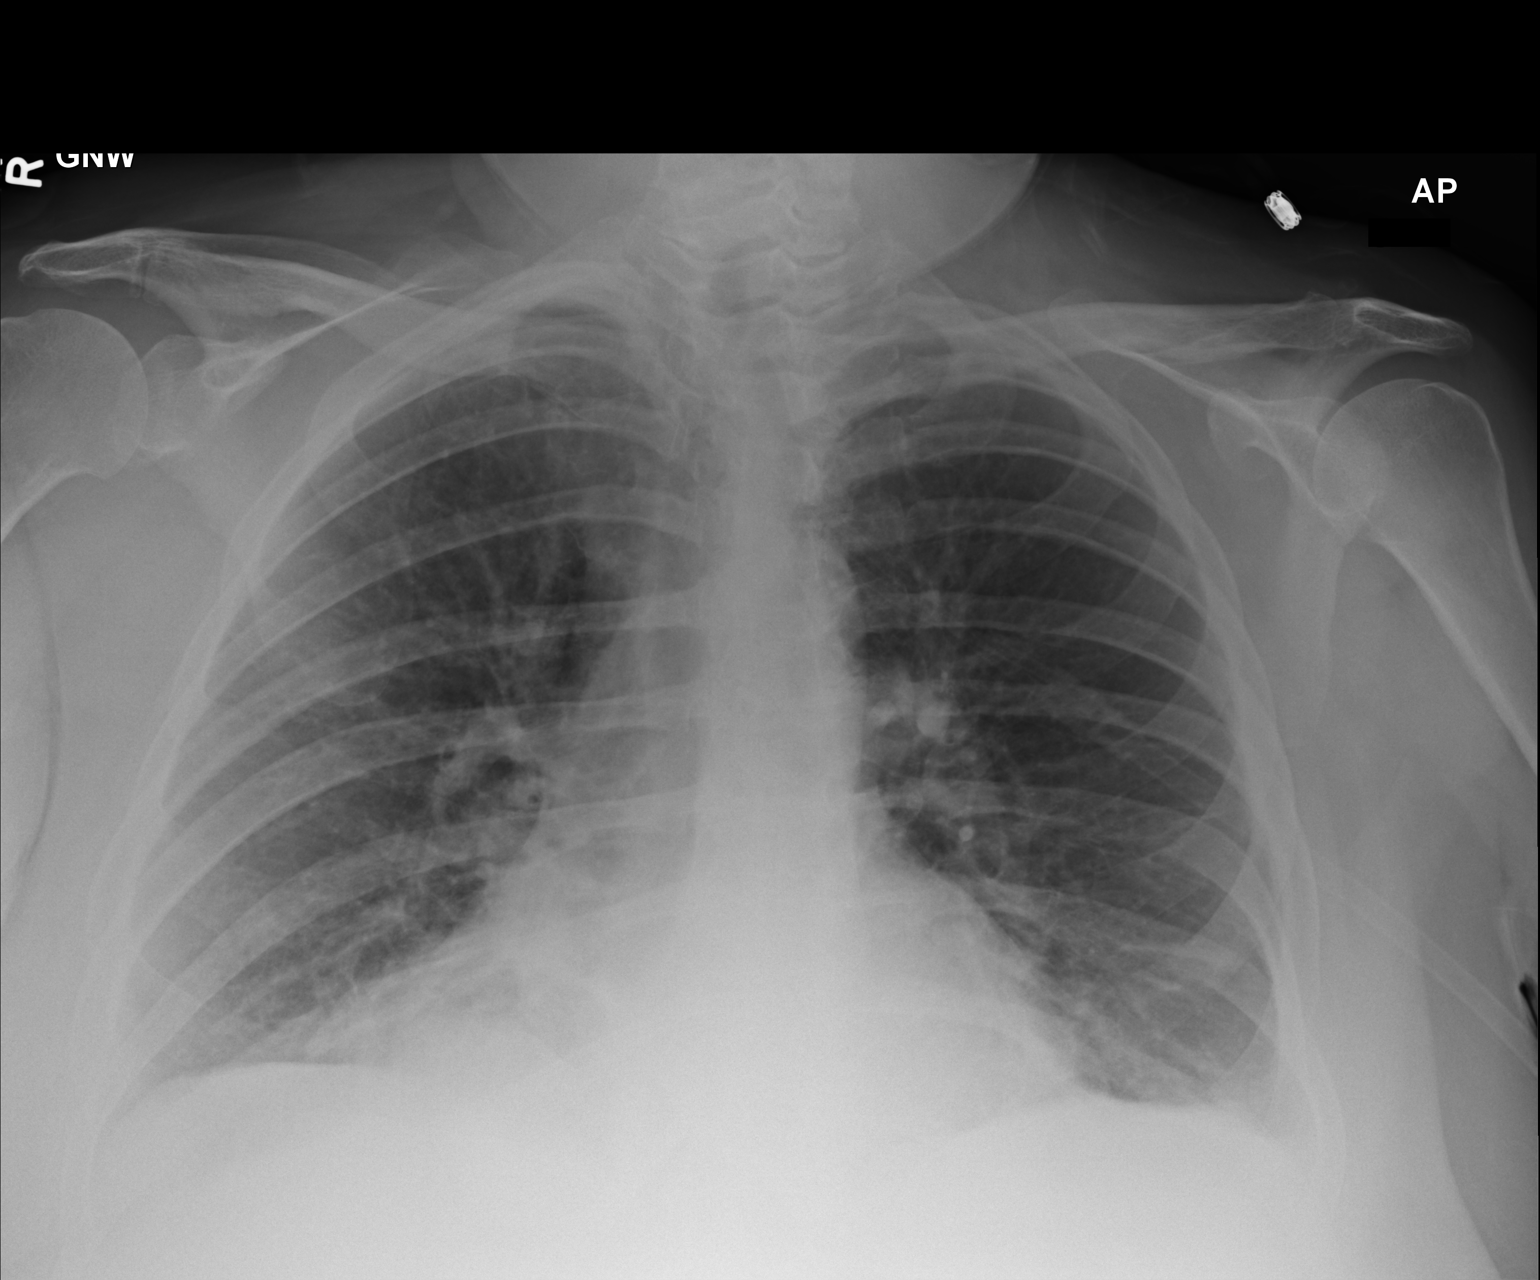

[1 of 1 positions shown; findings below may reference images not displayed]

FINDINGS: Shallow inspiration.  Heart size and pulmonary
vascularity are normal.  There is interval development of
consolidation in the medial right lung base, likely in the right
middle lung, suggesting pneumonia.  Mild atelectasis in the left
lung base.  No pneumothorax.  No blunting of costophrenic angles.
IMPRESSION: Infiltration and volume loss in the right lung base medially
suggesting pneumonia.

## 2015-06-28 ENCOUNTER — Encounter (HOSPITAL_COMMUNITY): Payer: Self-pay | Admitting: Family Medicine

## 2015-06-28 ENCOUNTER — Emergency Department (HOSPITAL_COMMUNITY): Payer: Medicare HMO

## 2015-06-28 ENCOUNTER — Inpatient Hospital Stay (HOSPITAL_COMMUNITY)
Admission: EM | Admit: 2015-06-28 | Discharge: 2015-07-03 | DRG: 291 | Disposition: A | Discharge status: Home / Self Care | Payer: Medicare HMO | Attending: Internal Medicine | Admitting: Internal Medicine

## 2015-06-28 ENCOUNTER — Telehealth: Payer: Self-pay | Admitting: Internal Medicine

## 2015-06-28 DIAGNOSIS — Z87891 Personal history of nicotine dependence: Secondary | ICD-10-CM

## 2015-06-28 DIAGNOSIS — Z885 Allergy status to narcotic agent status: Secondary | ICD-10-CM

## 2015-06-28 DIAGNOSIS — R072 Precordial pain: Secondary | ICD-10-CM | POA: Diagnosis not present

## 2015-06-28 DIAGNOSIS — Z881 Allergy status to other antibiotic agents status: Secondary | ICD-10-CM

## 2015-06-28 DIAGNOSIS — E1169 Type 2 diabetes mellitus with other specified complication: Secondary | ICD-10-CM

## 2015-06-28 DIAGNOSIS — G8929 Other chronic pain: Secondary | ICD-10-CM | POA: Diagnosis present

## 2015-06-28 DIAGNOSIS — Z79899 Other long term (current) drug therapy: Secondary | ICD-10-CM

## 2015-06-28 DIAGNOSIS — I480 Paroxysmal atrial fibrillation: Secondary | ICD-10-CM | POA: Diagnosis present

## 2015-06-28 DIAGNOSIS — I5043 Acute on chronic combined systolic (congestive) and diastolic (congestive) heart failure: Secondary | ICD-10-CM | POA: Diagnosis present

## 2015-06-28 DIAGNOSIS — G2581 Restless legs syndrome: Secondary | ICD-10-CM | POA: Diagnosis present

## 2015-06-28 DIAGNOSIS — R079 Chest pain, unspecified: Secondary | ICD-10-CM | POA: Diagnosis present

## 2015-06-28 DIAGNOSIS — E119 Type 2 diabetes mellitus without complications: Secondary | ICD-10-CM | POA: Diagnosis present

## 2015-06-28 DIAGNOSIS — E669 Obesity, unspecified: Secondary | ICD-10-CM

## 2015-06-28 DIAGNOSIS — J9692 Respiratory failure, unspecified with hypercapnia: Secondary | ICD-10-CM | POA: Diagnosis present

## 2015-06-28 DIAGNOSIS — Z7982 Long term (current) use of aspirin: Secondary | ICD-10-CM

## 2015-06-28 DIAGNOSIS — Z794 Long term (current) use of insulin: Secondary | ICD-10-CM

## 2015-06-28 DIAGNOSIS — Z9981 Dependence on supplemental oxygen: Secondary | ICD-10-CM

## 2015-06-28 DIAGNOSIS — I201 Angina pectoris with documented spasm: Secondary | ICD-10-CM | POA: Diagnosis present

## 2015-06-28 DIAGNOSIS — J9612 Chronic respiratory failure with hypercapnia: Secondary | ICD-10-CM | POA: Diagnosis present

## 2015-06-28 DIAGNOSIS — F411 Generalized anxiety disorder: Secondary | ICD-10-CM | POA: Diagnosis present

## 2015-06-28 DIAGNOSIS — Z950 Presence of cardiac pacemaker: Secondary | ICD-10-CM | POA: Diagnosis present

## 2015-06-28 DIAGNOSIS — M7989 Other specified soft tissue disorders: Secondary | ICD-10-CM

## 2015-06-28 DIAGNOSIS — I110 Hypertensive heart disease with heart failure: Principal | ICD-10-CM | POA: Diagnosis present

## 2015-06-28 DIAGNOSIS — I10 Essential (primary) hypertension: Secondary | ICD-10-CM | POA: Diagnosis present

## 2015-06-28 DIAGNOSIS — E785 Hyperlipidemia, unspecified: Secondary | ICD-10-CM | POA: Diagnosis present

## 2015-06-28 DIAGNOSIS — Z888 Allergy status to other drugs, medicaments and biological substances status: Secondary | ICD-10-CM

## 2015-06-28 DIAGNOSIS — N19 Unspecified kidney failure: Secondary | ICD-10-CM

## 2015-06-28 DIAGNOSIS — Z6833 Body mass index (BMI) 33.0-33.9, adult: Secondary | ICD-10-CM

## 2015-06-28 DIAGNOSIS — J449 Chronic obstructive pulmonary disease, unspecified: Secondary | ICD-10-CM | POA: Diagnosis present

## 2015-06-28 DIAGNOSIS — Z7952 Long term (current) use of systemic steroids: Secondary | ICD-10-CM

## 2015-06-28 DIAGNOSIS — F32A Depression, unspecified: Secondary | ICD-10-CM | POA: Diagnosis present

## 2015-06-28 DIAGNOSIS — F329 Major depressive disorder, single episode, unspecified: Secondary | ICD-10-CM | POA: Diagnosis present

## 2015-06-28 DIAGNOSIS — K219 Gastro-esophageal reflux disease without esophagitis: Secondary | ICD-10-CM | POA: Diagnosis present

## 2015-06-28 DIAGNOSIS — E66811 Obesity, class 1: Secondary | ICD-10-CM | POA: Diagnosis present

## 2015-06-28 DIAGNOSIS — Z8619 Personal history of other infectious and parasitic diseases: Secondary | ICD-10-CM | POA: Diagnosis present

## 2015-06-28 DIAGNOSIS — R7989 Other specified abnormal findings of blood chemistry: Secondary | ICD-10-CM | POA: Diagnosis present

## 2015-06-28 DIAGNOSIS — Z90710 Acquired absence of both cervix and uterus: Secondary | ICD-10-CM

## 2015-06-28 LAB — BASIC METABOLIC PANEL
Anion gap: 15 (ref 5–15)
BUN: 19 mg/dL (ref 6–20)
CALCIUM: 8.6 mg/dL — AB (ref 8.9–10.3)
CO2: 37 mmol/L — ABNORMAL HIGH (ref 22–32)
Chloride: 89 mmol/L — ABNORMAL LOW (ref 101–111)
Creatinine, Ser: 1.19 mg/dL — ABNORMAL HIGH (ref 0.44–1.00)
GFR calc Af Amer: 56 mL/min — ABNORMAL LOW (ref >60)
GFR, EST NON AFRICAN AMERICAN: 49 mL/min — AB (ref >60)
GLUCOSE: 212 mg/dL — AB (ref 65–99)
POTASSIUM: 3.7 mmol/L (ref 3.5–5.1)
SODIUM: 141 mmol/L (ref 135–145)

## 2015-06-28 LAB — CBC
HEMATOCRIT: 38.1 % (ref 36.0–46.0)
Hemoglobin: 11.8 g/dL — ABNORMAL LOW (ref 12.0–15.0)
MCH: 28.4 pg (ref 26.0–34.0)
MCHC: 31 g/dL (ref 30.0–36.0)
MCV: 91.6 fL (ref 78.0–100.0)
PLATELETS: 270 10*3/uL (ref 150–400)
RBC: 4.16 MIL/uL (ref 3.87–5.11)
RDW: 13.9 % (ref 11.5–15.5)
WBC: 10.4 10*3/uL (ref 4.0–10.5)

## 2015-06-28 LAB — I-STAT TROPONIN, ED: TROPONIN I, POC: 0.02 ng/mL (ref 0.00–0.08)

## 2015-06-28 LAB — D-DIMER, QUANTITATIVE: D-Dimer, Quant: 0.37 ug/mL-FEU (ref 0.00–0.50)

## 2015-06-28 LAB — BRAIN NATRIURETIC PEPTIDE: B NATRIURETIC PEPTIDE 5: 35.1 pg/mL (ref 0.0–100.0)

## 2015-06-28 MED ORDER — ASPIRIN 81 MG PO CHEW
324.0000 mg | CHEWABLE_TABLET | Freq: Once | ORAL | Status: AC
  Administered 2015-06-29: 324 mg via ORAL
  Filled 2015-06-28: qty 4

## 2015-06-28 MED ORDER — FUROSEMIDE 10 MG/ML IJ SOLN
40.0000 mg | Freq: Once | INTRAMUSCULAR | Status: AC
  Administered 2015-06-29: 40 mg via INTRAVENOUS
  Filled 2015-06-28: qty 4

## 2015-06-28 NOTE — Telephone Encounter (Signed)
Pt states she has been short of breath and chest discomfort for about week.  Pt states weight Friday April 21 was 217 lbs, weight yesterday 222 lbs weight today weight 224 lbs. Pt states hands, feet and face are swollen today. Pt does not know heart rate and BP today.

## 2015-06-28 NOTE — Telephone Encounter (Signed)
Reviewed with Tereso Newcomer, PA:  Per Scott-pt should report to [facility] ED for further evaluation, pt should not drive. Pt advised, verbalized understanding, agreed with plan.

## 2015-06-28 NOTE — ED Notes (Signed)
Pt here with SOB, chest pain, back pain, swelling. sts some weight gain. sts 225 lb this am from 217. sts she has been taking lasix without relief.

## 2015-06-28 NOTE — Telephone Encounter (Signed)
Pt states usual lasix dose is 40 mg bid, pt did take extra 40 mg lasix yesterday but does not feel it helped. Pt states heart rate is 62, O2 Sat 93%, BP ?Marland Kitchen

## 2015-06-28 NOTE — ED Provider Notes (Signed)
CSN: [id number]     Arrival date & time 06/28/15  1631 History   First MD Initiated Contact with Patient 06/28/15 2120     Chief Complaint  Patient presents with  . Shortness of Breath  . Chest Pain     (Consider location/radiation/quality/duration/timing/severity/associated sxs/prior Treatment) HPI 60 y.o. female with history of COPD on 4L of O2 at baseline, CHF, paroxysmal A. Fib, coronary vasospasm, with LHC on 01/03/2015 with no angiographic evidence of CAD, presents to the ED noting a progressively worsening exertional shortness of breath, swelling in her bilateral lower extremities progressively worsening abdominal distention, and an approximate 10 pound weight gain unalleviated by her home Lasix despite increased dose to 3 times a day per her PCP, as well as intermittent central pressure-like chest pain. This chest pain seems to come on occasionally both when at rest and with exertion. She complains of persistent orthopnea, which seems a bit worse than her baseline. She's been taking all of her prescribed medications as directed. Last stress test was last year.   Past Medical History  Diagnosis Date  . COPD (chronic obstructive pulmonary disease) (HCC)   . Chronic respiratory failure (HCC)   . Morbid obesity (HCC)   . Tobacco abuse   . GERD (gastroesophageal reflux disease)   . Hyperlipidemia   . Chronic pain   . Hypertension   . Coronary vasospasm (HCC)     a. 03/2004 NSTEMI/Cath: EF 45%, LM nl, LAD nl, D1 nl, D2 nl, LCX nl, OM1 large-nl, RCA-spasm noted, otw nl.  . Diabetes mellitus type 2 in obese (HCC)   . HTN (hypertension)   . 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 recurrent in setting of abx noncompliance.  . Chronic combined systolic (congestive) and diastolic (congestive) heart failure (HCC)     a. 2D ECHO with EF 35-40%, intraventricular dyssynchrony, diffuse hypokinesis, diastolic dysfunction, indeterminate LV filling pressure, normal atrial size, moderately dilated RV  with hypokinesis, mild TR,RVSP 44 mmHg.    Marland Kitchen History of complete heart block     a. s/p Medtronic PPM   . Chest pain     a. s/p LHC 01/03/15 with no angiographic evidence of CAD  . PAF (paroxysmal atrial fibrillation) [facility])    Past Surgical History  Procedure Laterality Date  . Cholecystectomy    . Partial hysterectomy    . Angioplasty    . Umbilical hernia repair  12/2009  . Permanent pacemaker insertion N/A 10/16/2012    Procedure: PERMANENT PACEMAKER INSERTION;  Surgeon: Marinus Maw, MD;  Medtronic Adapta  . Cardiac catheterization N/A 01/03/2015    Procedure: Left Heart Cath and Coronary Angiography;  Surgeon: Kathleene Hazel, MD;  Location: [facility] INVASIVE CV LAB;  Service: Cardiovascular;  Laterality: N/A;   Family History  Problem Relation Age of Onset  . Other Mother     borderline diabetic  . COPD Mother   . Heart Problems Father   . Cancer Father     lung  . Heart Problems Maternal Aunt   . Heart Problems Maternal Uncle   . Heart Problems Maternal Grandmother   . Heart failure Maternal Grandmother   . Heart Problems Maternal Grandfather   . Heart Problems Paternal Grandmother   . Heart Problems Paternal Grandfather   . Heart attack Daughter 39   Social History  Substance Use Topics  . Smoking status: Former Smoker -- 2.00 packs/day for 40 years    Types: Cigarettes    Quit date: 03/04/2012  .  Smokeless tobacco: Never Used  . Alcohol Use: No   OB History    No data available     Review of Systems  Constitutional: Positive for activity change. Negative for fever and chills.  HENT: Positive for facial swelling.   Respiratory: Positive for chest tightness and shortness of breath. Negative for cough and wheezing.   Cardiovascular: Positive for chest pain, palpitations and leg swelling.  Gastrointestinal: Positive for abdominal distention. Negative for nausea, vomiting, abdominal pain and diarrhea.  Genitourinary: Negative for dysuria and difficulty  urinating.  Musculoskeletal: Positive for back pain. Negative for myalgias, arthralgias, neck pain and neck stiffness.  Skin: Negative for rash and wound.  Neurological: Negative for dizziness, syncope, weakness, numbness and headaches.  All other systems reviewed and are negative.     Allergies  Ambien; Ciprofloxacin; Actos; Bydureon; Lunesta; Morphine and related; and Protonix  Home Medications   Prior to Admission medications   Medication Sig Start Date End Date Taking? Authorizing Provider  albuterol (PROVENTIL HFA;VENTOLIN HFA) 108 (90 BASE) MCG/ACT inhaler Inhale 2 puffs into the lungs every 6 (six) hours as needed for wheezing or shortness of breath.   Yes Historical Provider, MD  albuterol (PROVENTIL) (2.5 MG/3ML) 0.083% nebulizer solution Take 2.5 mg by nebulization every 6 (six) hours.   Yes Historical Provider, MD  ALPRAZolam Prudy Feeler) 0.5 MG tablet Take 1 tablet by mouth 3 (three) times daily. 05/30/15  Yes Historical Provider, MD  aspirin EC 81 MG tablet Take 81 mg by mouth 2 (two) times daily.   Yes Historical Provider, MD  atorvastatin (LIPITOR) 10 MG tablet Take 10 mg by mouth at bedtime.   Yes Historical Provider, MD  CARTIA XT 300 MG 24 hr capsule Take 1 tablet by mouth daily. 05/24/15  Yes Historical Provider, MD  digoxin (LANOXIN) 0.25 MG tablet Take 1 tablet by mouth daily. 04/19/15  Yes Historical Provider, MD  famotidine (PEPCID) 20 MG tablet Take 1 tablet (20 mg total) by mouth at bedtime. 09/16/12  Yes Vassie Loll, MD  fluticasone [facility]) 50 MCG/ACT nasal spray Place 2 sprays into both nostrils 2 (two) times daily.    Yes Historical Provider, MD  furosemide (LASIX) 80 MG tablet TAKE 1 TABLET TWICE DAILY Patient taking differently: TAKE 0.5 TABLET TWICE DAILY 05/20/15  Yes Janetta Hora, PA-C  Glycopyrrolate-Formoterol (BEVESPI AEROSPHERE) 9-4.8 MCG/ACT AERO Inhale 2 puffs into the lungs daily.   Yes Historical Provider, MD  HYDROcodone-acetaminophen (NORCO)  10-325 MG per tablet Take 1 tablet by mouth 3 (three) times daily as needed for moderate pain.  10/06/12  Yes Historical Provider, MD  insulin aspart (NOVOLOG) 100 UNIT/ML injection Inject 3-8 Units into the skin 3 (three) times daily as needed for high blood sugar. 150-200=3 units      anything >300=8 units   Yes Historical Provider, MD  metFORMIN (GLUCOPHAGE) 1000 MG tablet Take 1 tablet (1,000 mg total) by mouth 2 (two) times daily with a meal. 01/06/15  Yes Janetta Hora, PA-C  methocarbamol (ROBAXIN) 750 MG tablet Take 750 mg by mouth 3 (three) times daily.    Yes Historical Provider, MD  metoprolol succinate (TOPROL-XL) 25 MG 24 hr tablet TAKE 1 TABLET EVERY DAY 05/20/15  Yes Janetta Hora, PA-C  OXYGEN Inhale 4 L into the lungs continuous.   Yes Historical Provider, MD  rOPINIRole (REQUIP) 1 MG tablet Take 1 mg by mouth at bedtime.   Yes Historical Provider, MD  sertraline (ZOLOFT) 50 MG tablet Take 50 mg by  mouth daily.   Yes Historical Provider, MD  traZODone (DESYREL) 50 MG tablet Take 300 mg by mouth at bedtime.    Yes Historical Provider, MD  predniSONE (DELTASONE) 50 MG tablet Take 1 tablet (50 mg total) by mouth daily. 04/26/15   Dione Booze, MD   BP 146/53 mmHg  Pulse 60  Temp(Src) 98.1 F (36.7 C) (Oral)  Resp 8  Wt 102.059 kg  SpO2 91% Physical Exam  Constitutional: She is oriented to person, place, and time. She appears well-developed and well-nourished. No distress.  HENT:  Head: Normocephalic and atraumatic.  Nose: Nose normal.  Mouth/Throat: Oropharynx is clear and moist.  Eyes: Conjunctivae and EOM are normal. Pupils are equal, round, and reactive to light.  Neck: Neck supple.  Cardiovascular: Normal rate, normal heart sounds and intact distal pulses.  An irregularly irregular rhythm present.  Pulmonary/Chest: Effort normal. No respiratory distress. She has no wheezes. She has rales (bibasilar). She exhibits no tenderness.  Abdominal: Soft. She exhibits  distension. There is tenderness (mild generalized tenderness due to distention).  Musculoskeletal: She exhibits edema (2+ pitting edema in b/l LE predominantly over feet and ankles, equal bilaterally. ). She exhibits no tenderness.  Neurological: She is alert and oriented to person, place, and time. No cranial nerve deficit. Coordination normal.  Skin: Skin is warm and dry. No rash noted. She is not diaphoretic.  Nursing note and vitals reviewed.   ED Course  Procedures (including critical care time) Labs Review Labs Reviewed  BASIC METABOLIC PANEL - Abnormal; Notable for the following:    Chloride 89 (*)    CO2 37 (*)    Glucose, Bld 212 (*)    Creatinine, Ser 1.19 (*)    Calcium 8.6 (*)    GFR calc non Af Amer 49 (*)    GFR calc Af Amer 56 (*)    All other components within normal limits  CBC - Abnormal; Notable for the following:    Hemoglobin 11.8 (*)    All other components within normal limits  BRAIN NATRIURETIC PEPTIDE  D-DIMER, QUANTITATIVE (NOT AT [facility])  Rosezena Sensor, ED    Imaging Review Dg Chest 2 View  06/28/2015  CLINICAL DATA:  Shortness of Breath EXAM: CHEST  2 VIEW COMPARISON:  04/25/2015 FINDINGS: Cardiac shadow is enlarged. Pacing device is again seen and stable. The lungs are clear bilaterally. No acute bony abnormality is seen. IMPRESSION: No active cardiopulmonary disease. Electronically Signed   By: Alcide Clever M.D.   On: 06/28/2015 17:31   I have personally reviewed and evaluated these images and lab results as part of my medical decision-making.   EKG Interpretation   Date/Time:  Tuesday June 28 2015 21:49:04 EDT Ventricular Rate:  62 PR Interval:    QRS Duration: 179 QT Interval:  415 QTC Calculation: 421 R Axis:   -91 Text Interpretation:  Atrial fibrillation RBBB and LAFB LVH by voltage  Inferior infarct, acute (LCx) Lateral leads are also involved No  significant change was found Confirmed by Manus Gunning  MD, STEPHEN [phone]) on  06/28/2015  10:59:34 PM      MDM  60 y.o. female with hx of CHF, COPD on O2 by [place] at home with reassuring cath last year presents to the ED noting a 2-3 day history of precordial chest tightness. Physical exam as above. No further chest pain here.  EKG showed a 2 fibrillation with RBBB and LVH with no significant change from prior.  Labs are drawn and returned showing  negative troponin negative BNP negative d-dimer, with mildly elevated creatinine of 1.19, similar to prior. CXR showed no acute abnormality. She was given aspirin and IV lasix. Given the patient's intermittent chest pains over the last couple days associated with progressively worsening diffuse edema alleviated by home Lasix dosing and progressively worsening exertional shortness of breath the decision was made to admit the patient for cardiac observation and further diuresis.This plan was discussed with the patient at the bedside and she stated both understanding and agreement.   Final diagnoses:  Precordial pain  Swelling of lower extremity        Francoise Ceo, DO 06/28/15 2355  Glynn Octave, MD 06/29/15 [phone]

## 2015-06-28 NOTE — Telephone Encounter (Signed)
New Message  Pt c/o of Chest Pain: 1. Are you having CP right now? YES  2. Are you experiencing any other symptoms (ex. SOB, nausea, vomiting, sweating)? SOB , Nothing else  3. How long have you been experiencing CP? Off and on for 1 week 4. Is your CP continuous or coming and going?  Coming and going.  5. Have you taken Nitroglycerin? No  Pt c/o swelling: STAT is pt has developed SOB within 24 hours  1. How long have you been experiencing swelling? 1 week   2. Where is the swelling located? All over   3.  Are you currently taking a "fluid pill"? Yes! Doubled on fluid pills. 40 mg twice a day but it is not enough    4.  Are you currently SOB?  Yes   5.  Have you traveled recently? No    Comments: Pt states that she gets sick every time she eats. Pt reports that she has no energy and stomach pains. Please call back to discuss. Around her back and shoulder blades she has a lot of pain that comes and goes. Per pt she is associating it with the fluid. One day she weighed 217 and the next day she weighed 225. Hands are so swollen she cant make a fist.

## 2015-06-29 ENCOUNTER — Encounter (HOSPITAL_COMMUNITY): Payer: Self-pay | Admitting: Family Medicine

## 2015-06-29 DIAGNOSIS — E119 Type 2 diabetes mellitus without complications: Secondary | ICD-10-CM

## 2015-06-29 DIAGNOSIS — I10 Essential (primary) hypertension: Secondary | ICD-10-CM

## 2015-06-29 DIAGNOSIS — J438 Other emphysema: Secondary | ICD-10-CM | POA: Diagnosis not present

## 2015-06-29 DIAGNOSIS — Z950 Presence of cardiac pacemaker: Secondary | ICD-10-CM

## 2015-06-29 DIAGNOSIS — F329 Major depressive disorder, single episode, unspecified: Secondary | ICD-10-CM | POA: Diagnosis not present

## 2015-06-29 DIAGNOSIS — F411 Generalized anxiety disorder: Secondary | ICD-10-CM | POA: Diagnosis not present

## 2015-06-29 DIAGNOSIS — J9612 Chronic respiratory failure with hypercapnia: Secondary | ICD-10-CM

## 2015-06-29 DIAGNOSIS — Z8619 Personal history of other infectious and parasitic diseases: Secondary | ICD-10-CM

## 2015-06-29 DIAGNOSIS — I429 Cardiomyopathy, unspecified: Secondary | ICD-10-CM

## 2015-06-29 DIAGNOSIS — F32A Depression, unspecified: Secondary | ICD-10-CM | POA: Diagnosis present

## 2015-06-29 DIAGNOSIS — E785 Hyperlipidemia, unspecified: Secondary | ICD-10-CM

## 2015-06-29 DIAGNOSIS — I5043 Acute on chronic combined systolic (congestive) and diastolic (congestive) heart failure: Secondary | ICD-10-CM | POA: Diagnosis not present

## 2015-06-29 DIAGNOSIS — E669 Obesity, unspecified: Secondary | ICD-10-CM

## 2015-06-29 DIAGNOSIS — I480 Paroxysmal atrial fibrillation: Secondary | ICD-10-CM | POA: Diagnosis not present

## 2015-06-29 DIAGNOSIS — R7989 Other specified abnormal findings of blood chemistry: Secondary | ICD-10-CM | POA: Diagnosis present

## 2015-06-29 DIAGNOSIS — I201 Angina pectoris with documented spasm: Secondary | ICD-10-CM | POA: Diagnosis not present

## 2015-06-29 DIAGNOSIS — R748 Abnormal levels of other serum enzymes: Secondary | ICD-10-CM | POA: Diagnosis not present

## 2015-06-29 DIAGNOSIS — G2581 Restless legs syndrome: Secondary | ICD-10-CM

## 2015-06-29 DIAGNOSIS — R079 Chest pain, unspecified: Secondary | ICD-10-CM | POA: Diagnosis not present

## 2015-06-29 DIAGNOSIS — K219 Gastro-esophageal reflux disease without esophagitis: Secondary | ICD-10-CM

## 2015-06-29 DIAGNOSIS — G8929 Other chronic pain: Secondary | ICD-10-CM

## 2015-06-29 LAB — GLUCOSE, CAPILLARY
GLUCOSE-CAPILLARY: 338 mg/dL — AB (ref 65–99)
GLUCOSE-CAPILLARY: 227 mg/dL — AB (ref 65–99)
GLUCOSE-CAPILLARY: 266 mg/dL — AB (ref 65–99)
Glucose-Capillary: 206 mg/dL — ABNORMAL HIGH (ref 65–99)
Glucose-Capillary: 321 mg/dL — ABNORMAL HIGH (ref 65–99)

## 2015-06-29 LAB — C DIFFICILE QUICK SCREEN W PCR REFLEX
C Diff antigen: NEGATIVE
C Diff interpretation: NEGATIVE
C Diff toxin: NEGATIVE

## 2015-06-29 LAB — BASIC METABOLIC PANEL
Anion gap: 15 (ref 5–15)
BUN: 18 mg/dL (ref 6–20)
CHLORIDE: 87 mmol/L — AB (ref 101–111)
CO2: 37 mmol/L — AB (ref 22–32)
CREATININE: 1.14 mg/dL — AB (ref 0.44–1.00)
Calcium: 8.3 mg/dL — ABNORMAL LOW (ref 8.9–10.3)
GFR calc non Af Amer: 51 mL/min — ABNORMAL LOW (ref >60)
GFR, EST AFRICAN AMERICAN: 59 mL/min — AB (ref >60)
Glucose, Bld: 263 mg/dL — ABNORMAL HIGH (ref 65–99)
POTASSIUM: 3.7 mmol/L (ref 3.5–5.1)
Sodium: 139 mmol/L (ref 135–145)

## 2015-06-29 LAB — TROPONIN I
TROPONIN I: 0.03 ng/mL (ref <0.031)
TROPONIN I: 0.03 ng/mL (ref <0.031)
Troponin I: 0.03 ng/mL (ref <0.031)

## 2015-06-29 LAB — T4, FREE: FREE T4: 0.99 ng/dL (ref 0.61–1.12)

## 2015-06-29 LAB — DIGOXIN LEVEL: DIGOXIN LVL: 1.6 ng/mL (ref 0.8–2.0)

## 2015-06-29 LAB — TSH: TSH: 2.064 u[IU]/mL (ref 0.350–4.500)

## 2015-06-29 MED ORDER — SODIUM CHLORIDE 0.9 % IV SOLN: 250.0000 mL | INTRAVENOUS | Status: DC | PRN

## 2015-06-29 MED ORDER — FAMOTIDINE 20 MG PO TABS
20.0000 mg | ORAL_TABLET | Freq: Two times a day (BID) | ORAL | Status: DC
  Administered 2015-06-29 – 2015-07-03 (×9): 20 mg via ORAL
  Filled 2015-06-29 (×9): qty 1

## 2015-06-29 MED ORDER — ASPIRIN EC 81 MG PO TBEC
81.0000 mg | DELAYED_RELEASE_TABLET | Freq: Two times a day (BID) | ORAL | Status: DC
  Administered 2015-06-29 – 2015-07-03 (×10): 81 mg via ORAL
  Filled 2015-06-29 (×10): qty 1

## 2015-06-29 MED ORDER — DIGOXIN 125 MCG PO TABS
250.0000 ug | ORAL_TABLET | Freq: Every day | ORAL | Status: DC
  Administered 2015-06-29 – 2015-06-30 (×2): 250 ug via ORAL
  Filled 2015-06-29 (×2): qty 2

## 2015-06-29 MED ORDER — METOPROLOL SUCCINATE ER 25 MG PO TB24
25.0000 mg | ORAL_TABLET | Freq: Every day | ORAL | Status: DC
  Administered 2015-06-29 – 2015-07-03 (×5): 25 mg via ORAL
  Filled 2015-06-29 (×5): qty 1

## 2015-06-29 MED ORDER — DILTIAZEM HCL ER COATED BEADS 300 MG PO CP24
300.0000 mg | ORAL_CAPSULE | Freq: Every day | ORAL | Status: DC
  Administered 2015-06-29 – 2015-07-03 (×5): 300 mg via ORAL
  Filled 2015-06-29 (×6): qty 1

## 2015-06-29 MED ORDER — INSULIN ASPART 100 UNIT/ML ~~LOC~~ SOLN
8.0000 [IU] | Freq: Once | SUBCUTANEOUS | Status: AC
  Administered 2015-06-30: 8 [IU] via INTRAVENOUS

## 2015-06-29 MED ORDER — HYDROCODONE-ACETAMINOPHEN 10-325 MG PO TABS
1.0000 | ORAL_TABLET | Freq: Three times a day (TID) | ORAL | Status: DC | PRN
  Administered 2015-06-29 – 2015-07-03 (×8): 1 via ORAL
  Filled 2015-06-29 (×9): qty 1

## 2015-06-29 MED ORDER — GI COCKTAIL ~~LOC~~: 30.0000 mL | Freq: Three times a day (TID) | ORAL | Status: DC | PRN

## 2015-06-29 MED ORDER — FAMOTIDINE 20 MG PO TABS
20.0000 mg | ORAL_TABLET | Freq: Every day | ORAL | Status: DC
  Administered 2015-06-29: 20 mg via ORAL
  Filled 2015-06-29: qty 1

## 2015-06-29 MED ORDER — UMECLIDINIUM BROMIDE 62.5 MCG/INH IN AEPB
1.0000 | INHALATION_SPRAY | Freq: Every day | RESPIRATORY_TRACT | Status: DC
  Administered 2015-06-29 – 2015-07-03 (×5): 1 via RESPIRATORY_TRACT
  Filled 2015-06-29: qty 7

## 2015-06-29 MED ORDER — FLUTICASONE PROPIONATE 50 MCG/ACT NA SUSP
2.0000 | Freq: Two times a day (BID) | NASAL | Status: DC
  Administered 2015-06-29 – 2015-07-03 (×9): 2 via NASAL
  Filled 2015-06-29: qty 16

## 2015-06-29 MED ORDER — IPRATROPIUM-ALBUTEROL 0.5-2.5 (3) MG/3ML IN SOLN: 3.0000 mL | RESPIRATORY_TRACT | Status: DC | PRN

## 2015-06-29 MED ORDER — TRAZODONE HCL 150 MG PO TABS
300.0000 mg | ORAL_TABLET | Freq: Every day | ORAL | Status: DC
  Administered 2015-06-29 – 2015-07-02 (×5): 300 mg via ORAL
  Filled 2015-06-29 (×6): qty 2

## 2015-06-29 MED ORDER — ARFORMOTEROL TARTRATE 15 MCG/2ML IN NEBU
15.0000 ug | INHALATION_SOLUTION | Freq: Two times a day (BID) | RESPIRATORY_TRACT | Status: DC
  Administered 2015-06-29 – 2015-07-03 (×9): 15 ug via RESPIRATORY_TRACT
  Filled 2015-06-29 (×8): qty 2

## 2015-06-29 MED ORDER — SODIUM CHLORIDE 0.9% FLUSH
3.0000 mL | Freq: Two times a day (BID) | INTRAVENOUS | Status: DC
  Administered 2015-06-29 – 2015-07-03 (×9): 3 mL via INTRAVENOUS

## 2015-06-29 MED ORDER — ENOXAPARIN SODIUM 40 MG/0.4ML ~~LOC~~ SOLN
40.0000 mg | SUBCUTANEOUS | Status: DC
  Administered 2015-06-29 – 2015-07-03 (×5): 40 mg via SUBCUTANEOUS
  Filled 2015-06-29 (×5): qty 0.4

## 2015-06-29 MED ORDER — FUROSEMIDE 10 MG/ML IJ SOLN
40.0000 mg | Freq: Two times a day (BID) | INTRAMUSCULAR | Status: DC
  Administered 2015-06-29 – 2015-07-02 (×7): 40 mg via INTRAVENOUS
  Filled 2015-06-29 (×7): qty 4

## 2015-06-29 MED ORDER — METHOCARBAMOL 500 MG PO TABS
750.0000 mg | ORAL_TABLET | Freq: Three times a day (TID) | ORAL | Status: DC
  Administered 2015-06-29 – 2015-07-03 (×13): 750 mg via ORAL
  Filled 2015-06-29 (×14): qty 2

## 2015-06-29 MED ORDER — SERTRALINE HCL 50 MG PO TABS
50.0000 mg | ORAL_TABLET | Freq: Every day | ORAL | Status: DC
  Administered 2015-06-29: 50 mg via ORAL
  Filled 2015-06-29: qty 1

## 2015-06-29 MED ORDER — ATORVASTATIN CALCIUM 10 MG PO TABS
10.0000 mg | ORAL_TABLET | Freq: Every day | ORAL | Status: DC
  Administered 2015-06-29 – 2015-07-02 (×5): 10 mg via ORAL
  Filled 2015-06-29 (×5): qty 1

## 2015-06-29 MED ORDER — INSULIN ASPART 100 UNIT/ML ~~LOC~~ SOLN
0.0000 [IU] | Freq: Three times a day (TID) | SUBCUTANEOUS | Status: DC
  Administered 2015-06-29: 5 [IU] via SUBCUTANEOUS
  Administered 2015-06-29 (×2): 11 [IU] via SUBCUTANEOUS
  Administered 2015-06-30 (×2): 5 [IU] via SUBCUTANEOUS
  Administered 2015-06-30: 11 [IU] via SUBCUTANEOUS
  Administered 2015-07-01: 8 [IU] via SUBCUTANEOUS
  Administered 2015-07-01: 11 [IU] via SUBCUTANEOUS
  Administered 2015-07-01: 8 [IU] via SUBCUTANEOUS
  Administered 2015-07-02: 15 [IU] via SUBCUTANEOUS
  Administered 2015-07-02 (×2): 8 [IU] via SUBCUTANEOUS

## 2015-06-29 MED ORDER — NITROGLYCERIN 0.4 MG SL SUBL: 0.4000 mg | SUBLINGUAL_TABLET | SUBLINGUAL | Status: DC | PRN

## 2015-06-29 MED ORDER — SODIUM CHLORIDE 0.9% FLUSH: 3.0000 mL | INTRAVENOUS | Status: DC | PRN

## 2015-06-29 MED ORDER — SERTRALINE HCL 100 MG PO TABS
100.0000 mg | ORAL_TABLET | Freq: Every day | ORAL | Status: DC
  Administered 2015-06-30 – 2015-07-03 (×4): 100 mg via ORAL
  Filled 2015-06-29 (×4): qty 1

## 2015-06-29 MED ORDER — ROPINIROLE HCL 1 MG PO TABS
1.0000 mg | ORAL_TABLET | Freq: Every day | ORAL | Status: DC
  Administered 2015-06-29 – 2015-07-02 (×5): 1 mg via ORAL
  Filled 2015-06-29 (×5): qty 1

## 2015-06-29 MED ORDER — ALPRAZOLAM 0.5 MG PO TABS
0.5000 mg | ORAL_TABLET | Freq: Three times a day (TID) | ORAL | Status: DC
  Administered 2015-06-29 – 2015-07-03 (×14): 0.5 mg via ORAL
  Filled 2015-06-29 (×14): qty 1

## 2015-06-29 MED ORDER — ACETAMINOPHEN 325 MG PO TABS: 650.0000 mg | ORAL_TABLET | ORAL | Status: DC | PRN

## 2015-06-29 MED ORDER — ONDANSETRON HCL 4 MG/2ML IJ SOLN: 4.0000 mg | Freq: Four times a day (QID) | INTRAMUSCULAR | Status: DC | PRN

## 2015-06-29 NOTE — Progress Notes (Signed)
   06/29/15 0150  Vitals  Temp 98.3 F (36.8 C)  Temp Source Oral  BP 134/67 mmHg  BP Location Right Arm  BP Method Automatic  Patient Position (if appropriate) Sitting  Pulse Rate 65  Pulse Rate Source Monitor  Resp 12  Oxygen Therapy  SpO2 96 %  O2 Device Nasal Cannula  O2 Flow Rate (L/min) 4 L/min  Height and Weight  Height 5\' 8"  (1.727 m)  Weight 100.7 kg (222 lb 0.1 oz)  BSA (Calculated - sq m) 2.2 sq meters  BMI (Calculated) 33.8  Weight in (lb) to have BMI = 25 164.1    Report received. Pt arrived to unit. VSS. No complaints of pain. Pt oriented to room. Call bell in place.  Sandrea Hammond RN

## 2015-06-29 NOTE — H&P (Signed)
History and Physical    [name]:[id number] DOB: 18-Oct-1955 DOA: 06/28/2015  Referring Provider: EDP PCP: [name], NP  Outpatient Specialists: Dr. Clent Ridges (pulmonology), Dr. Ladona Ridgel (cardiology)   Patient coming from: Home  Chief Complaint: SOB, swelling, CP   HPI: [name] is a 60 y.o. female with medical history significant for COPD on 4 L/m supplemental oxygen around-the-clock, chronic combined CHF, paroxysmal atrial fibrillation, insulin-dependent diabetes mellitus, hypertension, depression, and anxiety who presents to the ED with dyspnea, swelling, and chest pain. Patient reports being in her usual state of health until approximately one week ago when she noted insidious onset of edema in upper and lower extremities, dyspnea, and fleeting "pressure" sensation in the chest. Symptoms worsened acutely over the past 24 hours, prompting her visit to the ED. Patient denies any recent fevers, chills, or new cough. She denies any recent travel or unilateral leg swelling or tenderness. Chest pain is described as pressure sensation in the central chest with no alleviating or exacerbating factors and no radiation. Symptoms come and go spontaneously. She reports weight gain of 8 pounds over the past 5 days and took an extra dose of Lasix yesterday with no appreciable change in symptoms. She called her cardiologist's office today with these complaints and was directed to the ED.  ED Course: Upon arrival to the ED, patient is found to be afebrile, saturating adequately on 4 L/m, and with vitals otherwise stable. EKG is notable for atrial fibrillation with RBBB, LaFB, and LVH by voltage criteria. Chest x-ray features cardiomegaly but is negative for acute cardiopulmonary disease. BMP is notable for serum creatinine of 1.19, up from an apparent baseline of 0.9. Serum bicarbonate is 37, stable relative to prior measurements. D-dimer was obtained by the EDP and negative. Initial troponin  is 0.02 and BNP is 35. Chest pain had resolved spontaneously prior to the patient's arrival in the ED. She was given a 324 mg aspirin and Lasix 40 mg IV push. She remained hemodynamically stable in the ED and will be admitted to the hospital for ongoing evaluation and management of dyspnea with chest pain.  Review of Systems:  All other systems reviewed and apart from HPI, are negative.  Past Medical History  Diagnosis Date  . COPD (chronic obstructive pulmonary disease) (HCC)   . Chronic respiratory failure (HCC)   . Morbid obesity (HCC)   . Tobacco abuse   . GERD (gastroesophageal reflux disease)   . Hyperlipidemia   . Chronic pain   . Hypertension   . Coronary vasospasm (HCC)     a. 03/2004 NSTEMI/Cath: EF 45%, LM nl, LAD nl, D1 nl, D2 nl, LCX nl, OM1 large-nl, RCA-spasm noted, otw nl.  . Diabetes mellitus type 2 in obese (HCC)   . HTN (hypertension)   . 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 recurrent in setting of abx noncompliance.  . Chronic combined systolic (congestive) and diastolic (congestive) heart failure (HCC)     a. 2D ECHO with EF 35-40%, intraventricular dyssynchrony, diffuse hypokinesis, diastolic dysfunction, indeterminate LV filling pressure, normal atrial size, moderately dilated RV with hypokinesis, mild TR,RVSP 44 mmHg.    Marland Kitchen History of complete heart block     a. s/p Medtronic PPM   . Chest pain     a. s/p LHC 01/03/15 with no angiographic evidence of CAD  . PAF (paroxysmal atrial fibrillation) [facility])     Past Surgical History  Procedure Laterality Date  . Cholecystectomy    . Partial hysterectomy    .  Angioplasty    . Umbilical hernia repair  12/2009  . Permanent pacemaker insertion N/A 10/16/2012    Procedure: PERMANENT PACEMAKER INSERTION;  Surgeon: Marinus Maw, MD;  Medtronic Adapta  . Cardiac catheterization N/A 01/03/2015    Procedure: Left Heart Cath and Coronary Angiography;  Surgeon: Kathleene Hazel, MD;  Location: [facility] INVASIVE CV LAB;   Service: Cardiovascular;  Laterality: N/A;     reports that she quit smoking about 3 years ago. Her smoking use included Cigarettes. She has a 80 pack-year smoking history. She has never used smokeless tobacco. She reports that she does not drink alcohol or use illicit drugs.  Allergies  Allergen Reactions  . Ambien [Zolpidem Tartrate] Other (See Comments)    Severe hallucinations and behavior changes reported by family  . Ciprofloxacin     PATIENT AT VERY HIGH RISK FOR C DIFFICILE COLITIS  . Actos [Pioglitazone] Swelling  . Bydureon [Exenatide]     Stomach pain  . Lunesta [Eszopiclone] Other (See Comments)    Nightmares & hallucinations  . Morphine And Related Itching  . Protonix [Pantoprazole Sodium] Other (See Comments)    Unknown reaction-"Doctor told me to never take it"    Family History  Problem Relation Age of Onset  . Other Mother     borderline diabetic  . COPD Mother   . Heart Problems Father   . Cancer Father     lung  . Heart Problems Maternal Aunt   . Heart Problems Maternal Uncle   . Heart Problems Maternal Grandmother   . Heart failure Maternal Grandmother   . Heart Problems Maternal Grandfather   . Heart Problems Paternal Grandmother   . Heart Problems Paternal Grandfather   . Heart attack Daughter 40     Prior to Admission medications   Medication Sig Start Date End Date Taking? Authorizing Provider  albuterol (PROVENTIL HFA;VENTOLIN HFA) 108 (90 BASE) MCG/ACT inhaler Inhale 2 puffs into the lungs every 6 (six) hours as needed for wheezing or shortness of breath.   Yes Historical Provider, MD  albuterol (PROVENTIL) (2.5 MG/3ML) 0.083% nebulizer solution Take 2.5 mg by nebulization every 6 (six) hours.   Yes Historical Provider, MD  ALPRAZolam Prudy Feeler) 0.5 MG tablet Take 1 tablet by mouth 3 (three) times daily. 05/30/15  Yes Historical Provider, MD  aspirin EC 81 MG tablet Take 81 mg by mouth 2 (two) times daily.   Yes Historical Provider, MD  atorvastatin  (LIPITOR) 10 MG tablet Take 10 mg by mouth at bedtime.   Yes Historical Provider, MD  CARTIA XT 300 MG 24 hr capsule Take 1 tablet by mouth daily. 05/24/15  Yes Historical Provider, MD  digoxin (LANOXIN) 0.25 MG tablet Take 1 tablet by mouth daily. 04/19/15  Yes Historical Provider, MD  famotidine (PEPCID) 20 MG tablet Take 1 tablet (20 mg total) by mouth at bedtime. 09/16/12  Yes Vassie Loll, MD  fluticasone [facility]) 50 MCG/ACT nasal spray Place 2 sprays into both nostrils 2 (two) times daily.    Yes Historical Provider, MD  furosemide (LASIX) 80 MG tablet TAKE 1 TABLET TWICE DAILY Patient taking differently: TAKE 0.5 TABLET TWICE DAILY 05/20/15  Yes Janetta Hora, PA-C  Glycopyrrolate-Formoterol (BEVESPI AEROSPHERE) 9-4.8 MCG/ACT AERO Inhale 2 puffs into the lungs daily.   Yes Historical Provider, MD  HYDROcodone-acetaminophen (NORCO) 10-325 MG per tablet Take 1 tablet by mouth 3 (three) times daily as needed for moderate pain.  10/06/12  Yes Historical Provider, MD  insulin aspart (NOVOLOG)  100 UNIT/ML injection Inject 3-8 Units into the skin 3 (three) times daily as needed for high blood sugar. 150-200=3 units      anything >300=8 units   Yes Historical Provider, MD  metFORMIN (GLUCOPHAGE) 1000 MG tablet Take 1 tablet (1,000 mg total) by mouth 2 (two) times daily with a meal. 01/06/15  Yes Janetta Hora, PA-C  methocarbamol (ROBAXIN) 750 MG tablet Take 750 mg by mouth 3 (three) times daily.    Yes Historical Provider, MD  metoprolol succinate (TOPROL-XL) 25 MG 24 hr tablet TAKE 1 TABLET EVERY DAY 05/20/15  Yes Janetta Hora, PA-C  OXYGEN Inhale 4 L into the lungs continuous.   Yes Historical Provider, MD  rOPINIRole (REQUIP) 1 MG tablet Take 1 mg by mouth at bedtime.   Yes Historical Provider, MD  sertraline (ZOLOFT) 50 MG tablet Take 50 mg by mouth daily.   Yes Historical Provider, MD  traZODone (DESYREL) 50 MG tablet Take 300 mg by mouth at bedtime.    Yes Historical Provider, MD    predniSONE (DELTASONE) 50 MG tablet Take 1 tablet (50 mg total) by mouth daily. 04/26/15   Dione Booze, MD    Physical Exam: Filed Vitals:   06/28/15 1641 06/28/15 2024 06/28/15 2130  BP: 143/67 149/56 146/53  Pulse: 63 62 60  Temp: 98.2 F (36.8 C)  98.1 F (36.7 C)  TempSrc:   Oral  Resp: Weight: 102.059 kg (225 lb)    SpO2: 97% 96% 91%      Constitutional:  In mild respiratory distress with dyspnea between sentences and accessory muscle use Eyes: PERTLA, lids and conjunctivae normal ENMT: Mucous membranes are moist. Posterior pharynx clear of any exudate or lesions.  Neck: normal, supple, no masses, no thyromegaly Respiratory: Mildly diminished b/l, rhonchi on right, no rales, expiratory wheeze.  Cardiovascular: Rate ~60 and irregular, no murmurs / rubs / gallop. No carotid bruits.  Abdomen: No distension, no tenderness, no masses palpated. Bowel sounds normal.  Musculoskeletal: no clubbing / cyanosis. No joint deformity upper and lower extremities. Pitting edema of b/l LEs to knee  Skin: no rashes, lesions, ulcers. No induration Neurologic: CN 2-12 grossly intact. Sensation intact, DTR normal. Strength 5/5 in all 4 limbs.  Psychiatric: Normal judgment and insight. Alert and oriented x 3. Normal mood.     Labs on Admission: I have personally reviewed following labs and imaging studies  CBC:  Recent Labs Lab 06/28/15 1652  WBC 10.4  HGB 11.8*  HCT 38.1  MCV 91.6  PLT 270   Basic Metabolic Panel:  Recent Labs Lab 06/28/15 1652  NA 141  K 3.7  CL 89*  CO2 37*  GLUCOSE 212*  BUN 19  CREATININE 1.19*  CALCIUM 8.6*   GFR: Estimated Creatinine Clearance: 60.6 mL/min (by C-G formula based on Cr of 1.19). Liver Function Tests: No results for input(s): AST, ALT, ALKPHOS, BILITOT, PROT, ALBUMIN in the last 168 hours. No results for input(s): LIPASE, AMYLASE in the last 168 hours. No results for input(s): AMMONIA in the last 168 hours. Coagulation  Profile: No results for input(s): INR, PROTIME in the last 168 hours. Cardiac Enzymes: No results for input(s): CKTOTAL, CKMB, CKMBINDEX, TROPONINI in the last 168 hours. BNP (last 3 results) No results for input(s): PROBNP in the last 8760 hours. HbA1C: No results for input(s): HGBA1C in the last 72 hours. CBG: No results for input(s): GLUCAP in the last 168 hours. Lipid Profile: No results for input(s):  CHOL, HDL, LDLCALC, TRIG, CHOLHDL, LDLDIRECT in the last 72 hours. Thyroid Function Tests: No results for input(s): TSH, T4TOTAL, FREET4, T3FREE, THYROIDAB in the last 72 hours. Anemia Panel: No results for input(s): VITAMINB12, FOLATE, FERRITIN, TIBC, IRON, RETICCTPCT in the last 72 hours. Urine analysis:    Component Value Date/Time   COLORURINE YELLOW 04/16/2014 2230   APPEARANCEUR CLEAR 04/16/2014 2230   LABSPEC 1.026 04/16/2014 2230   PHURINE 6.0 04/16/2014 2230   GLUCOSEU >1000* 04/16/2014 2230   HGBUR LARGE* 04/16/2014 2230   BILIRUBINUR NEGATIVE 04/16/2014 2230   KETONESUR NEGATIVE 04/16/2014 2230   PROTEINUR 30* 04/16/2014 2230   UROBILINOGEN 0.2 04/16/2014 2230   NITRITE NEGATIVE 04/16/2014 2230   LEUKOCYTESUR SMALL* 04/16/2014 2230   Sepsis Labs: @LABRCNTIP (procalcitonin:4,lacticidven:4) )No results found for this or any previous visit (from the past 240 hour(s)).   Radiological Exams on Admission: Dg Chest 2 View  06/28/2015  CLINICAL DATA:  Shortness of Breath EXAM: CHEST  2 VIEW COMPARISON:  04/25/2015 FINDINGS: Cardiac shadow is enlarged. Pacing device is again seen and stable. The lungs are clear bilaterally. No acute bony abnormality is seen. IMPRESSION: No active cardiopulmonary disease. Electronically Signed   By: Alcide Clever M.D.   On: 06/28/2015 17:31    EKG: Independently reviewed. Atrial fibrillation, RBBB, LaFB, LVH by voltage criteria, not significantly changed from prior  Assessment/Plan  1. Chest pain, SOB  - Etiology is uncertain and likely  multifactorial  - D-dimer is 0.35 in pt with low pre-test probability  - Sxs are concerning for possible ACS, though initial troponin is wnl and she had a negative cath in October 2016  - First troponin 0.02, EKG not significantly changed from prior  - Monitor on telemetry for ischemic changes, obtain serial troponin measurements, repeat EKG  - ASA 324 mg given in ED, high-intensity statin ordered, hold beta-blocker for now given suspected acute CHF  - Check TTE, ordered   2. Acute on chronic combined systolic/diastolic CHF  - TTE (01/01/2015) with EF 35-40%, grade 1 diastolic dysfunction, mild LVH  - Acute CHF suspected, but there is diagnostic uncertainty given low BNP and absence of pulmonary edema on CXR  - There is peripheral edema on exam, no gallop appreciated, neck veins difficult to assess given body habitus - Pt reports wt gain of 8 lbs over last 4-5 days  - Takes Lasix 40 mg BID at home, took an extra dose on 06/26/15 without appreciable improvement - Lasix 40 mg IV x1 given in ED; will continue with 40 mg IV BID, adjusted prn  - SLIV, daily wts, strict I/Os, fluid-restrict diet  - Update TTE, ordered   3. COPD with chronic hypercapnic respiratory failure - Requires 4 Lpm supplemental O2 at baseline  - Appears to be stable with no increase in cough or sputum production  - Continue inhaled LABA and anticholinergic with formulary substitution  - DuoNebs q4h prn   4. Type II DM  - Managed at home with metformin and Novolog 3-8 units TID with meals according to sliding-scale  - A1c 7.8% on 12/31/14, reflecting suboptimal control at that time  - Hold metformin while admitted  - No basal insulin for now; treat with moderate-intensity SSI TID and qHS  - Check CBG with meals and qHS, adjust insulin regimen prn  - Carb-modified diet when appropriate  - Update A1c, pending   5. Paroxysmal atrial fibrillation  - In atrial fibrillation on admission  - CHADS-VASc is 4 (gender, CHF,  HTN, DM) -  She is not anticoagulated - Continue rate-control strategy with Toprol, Cardizem, digoxin  - Monitor on telemetry    6. Hypertension  - At goal currently  - Continue current-dose Toprol, diltiazem   7. Depression, anxiety  - Appears stable  - Continue Zoloft and trazodone  - Continue prn Xanax    DVT prophylaxis: sq Lovenox  Code Status: Full  Family Communication: None available  Disposition Plan: Admit to telemetry  Consults called: None  Admission status: Observation    Briscoe Deutscher MD [facility] Pager [phone]  If 7PM-7AM, please contact night-coverage [email or web address] Password [facility]  06/29/2015, 12:15 AM

## 2015-06-29 NOTE — Consult Note (Addendum)
Patient ID: [name] MRN: [id number] DOB/AGE: [birth year] 60 y.o.  Admit date: 06/28/2015 Referring Physician: Zoe Lan Primary Cardiologist: Ladona Ridgel Reason for Consultation: CHF  HPI: 60 yo female with history of HLD, HTN, COPD, morbid obesity, coronary vasospasm but no CAD, DM, chronic combined CHF, complete heart block s/p PPM, PAF admitted with dyspnea, LE edema and chest pain. Most recent echo October 2016 with LVEF=35-40%. Cardiac cath October 2016 with no evidence of CAD. She reports dyspnea and chest pressure for several weeks along with increased LE edema and 11 lb weight gain. She had an URI and was given steroids and that is when she noticed the above symptoms. Troponin negative x 2. EKG with atrial pacing, RBBB. No ischemic changes. She is on IV Lasix and is having increased urination but she has not been catching the urine so the I/O measurement is inaccurate. Endorses orthopnea for 2 weeks.    Past Medical History  Diagnosis Date  . COPD (chronic obstructive pulmonary disease) (HCC)   . Chronic respiratory failure (HCC)   . Morbid obesity (HCC)   . Tobacco abuse   . GERD (gastroesophageal reflux disease)   . Hyperlipidemia   . Chronic pain   . Hypertension   . Coronary vasospasm (HCC)     a. 03/2004 NSTEMI/Cath: EF 45%, LM nl, LAD nl, D1 nl, D2 nl, LCX nl, OM1 large-nl, RCA-spasm noted, otw nl.  . Diabetes mellitus type 2 in obese (HCC)   . HTN (hypertension)   . 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 recurrent in setting of abx noncompliance.  . Chronic combined systolic (congestive) and diastolic (congestive) heart failure (HCC)     a. 2D ECHO with EF 35-40%, intraventricular dyssynchrony, diffuse hypokinesis, diastolic dysfunction, indeterminate LV filling pressure, normal atrial size, moderately dilated RV with hypokinesis, mild TR,RVSP 44 mmHg.    Marland Kitchen History of complete heart block     a. s/p Medtronic PPM   . Chest pain     a. s/p LHC 01/03/15 with no  angiographic evidence of CAD  . PAF (paroxysmal atrial fibrillation) (HCC)     Family History  Problem Relation Age of Onset  . Other Mother     borderline diabetic  . COPD Mother   . Heart Problems Father   . Cancer Father     lung  . Heart Problems Maternal Aunt   . Heart Problems Maternal Uncle   . Heart Problems Maternal Grandmother   . Heart failure Maternal Grandmother   . Heart Problems Maternal Grandfather   . Heart Problems Paternal Grandmother   . Heart Problems Paternal Grandfather   . Heart attack Daughter 76    Social History   Social History  . Marital Status: Widowed    Spouse Name: N/A  . Number of Children: 1  . Years of Education: N/A   Occupational History  . Environmental education officer   Social History Main Topics  . Smoking status: Former Smoker -- 2.00 packs/day for 40 years    Types: Cigarettes    Quit date: 03/04/2012  . Smokeless tobacco: Never Used  . Alcohol Use: No  . Drug Use: No  . Sexual Activity: Not on file   Other Topics Concern  . Not on file   Social History Narrative   Lives in [place] with her husband.  She does not routinely exercise.    Past Surgical History  Procedure Laterality Date  . Cholecystectomy    .  Partial hysterectomy    . Angioplasty    . Umbilical hernia repair  12/2009  . Permanent pacemaker insertion N/A 10/16/2012    Procedure: PERMANENT PACEMAKER INSERTION;  Surgeon: Marinus Maw, MD;  Medtronic Adapta  . Cardiac catheterization N/A 01/03/2015    Procedure: Left Heart Cath and Coronary Angiography;  Surgeon: Kathleene Hazel, MD;  Location: [facility] INVASIVE CV LAB;  Service: Cardiovascular;  Laterality: N/A;    Allergies  Allergen Reactions  . Ambien [Zolpidem Tartrate] Other (See Comments)    Severe hallucinations and behavior changes reported by family  . Ciprofloxacin     PATIENT AT VERY HIGH RISK FOR C DIFFICILE COLITIS  . Actos [Pioglitazone] Swelling  . Bydureon [Exenatide]     Stomach  pain  . Lunesta [Eszopiclone] Other (See Comments)    Nightmares & hallucinations  . Morphine And Related Itching  . Protonix [Pantoprazole Sodium] Other (See Comments)    Unknown reaction-"Doctor told me to never take it"    Prior to Admission medications   Medication Sig Start Date End Date Taking? Authorizing Provider  albuterol (PROVENTIL HFA;VENTOLIN HFA) 108 (90 BASE) MCG/ACT inhaler Inhale 2 puffs into the lungs every 6 (six) hours as needed for wheezing or shortness of breath.   Yes Historical Provider, MD  albuterol (PROVENTIL) (2.5 MG/3ML) 0.083% nebulizer solution Take 2.5 mg by nebulization every 6 (six) hours.   Yes Historical Provider, MD  ALPRAZolam Prudy Feeler) 0.5 MG tablet Take 1 tablet by mouth 3 (three) times daily. 05/30/15  Yes Historical Provider, MD  aspirin EC 81 MG tablet Take 81 mg by mouth 2 (two) times daily.   Yes Historical Provider, MD  atorvastatin (LIPITOR) 10 MG tablet Take 10 mg by mouth at bedtime.   Yes Historical Provider, MD  CARTIA XT 300 MG 24 hr capsule Take 1 tablet by mouth daily. 05/24/15  Yes Historical Provider, MD  digoxin (LANOXIN) 0.25 MG tablet Take 1 tablet by mouth daily. 04/19/15  Yes Historical Provider, MD  famotidine (PEPCID) 20 MG tablet Take 1 tablet (20 mg total) by mouth at bedtime. 09/16/12  Yes Vassie Loll, MD  fluticasone [facility]) 50 MCG/ACT nasal spray Place 2 sprays into both nostrils 2 (two) times daily.    Yes Historical Provider, MD  furosemide (LASIX) 80 MG tablet TAKE 1 TABLET TWICE DAILY Patient taking differently: TAKE 0.5 TABLET TWICE DAILY 05/20/15  Yes Janetta Hora, PA-C  Glycopyrrolate-Formoterol (BEVESPI AEROSPHERE) 9-4.8 MCG/ACT AERO Inhale 2 puffs into the lungs daily.   Yes Historical Provider, MD  HYDROcodone-acetaminophen (NORCO) 10-325 MG per tablet Take 1 tablet by mouth 3 (three) times daily as needed for moderate pain.  10/06/12  Yes Historical Provider, MD  insulin aspart (NOVOLOG) 100 UNIT/ML injection  Inject 3-8 Units into the skin 3 (three) times daily as needed for high blood sugar. 150-200=3 units      anything >300=8 units   Yes Historical Provider, MD  metFORMIN (GLUCOPHAGE) 1000 MG tablet Take 1 tablet (1,000 mg total) by mouth 2 (two) times daily with a meal. 01/06/15  Yes Janetta Hora, PA-C  methocarbamol (ROBAXIN) 750 MG tablet Take 750 mg by mouth 3 (three) times daily.    Yes Historical Provider, MD  metoprolol succinate (TOPROL-XL) 25 MG 24 hr tablet TAKE 1 TABLET EVERY DAY 05/20/15  Yes Janetta Hora, PA-C  OXYGEN Inhale 4 L into the lungs continuous.   Yes Historical Provider, MD  rOPINIRole (REQUIP) 1 MG tablet Take 1 mg by mouth  at bedtime.   Yes Historical Provider, MD  sertraline (ZOLOFT) 50 MG tablet Take 50 mg by mouth daily.   Yes Historical Provider, MD  traZODone (DESYREL) 50 MG tablet Take 300 mg by mouth at bedtime.    Yes Historical Provider, MD  predniSONE (DELTASONE) 50 MG tablet Take 1 tablet (50 mg total) by mouth daily. 04/26/15   Dione Booze, MD   . ALPRAZolam  0.5 mg Oral TID  . arformoterol  15 mcg Nebulization BID  . aspirin EC  81 mg Oral BID  . atorvastatin  10 mg Oral QHS  . digoxin  250 mcg Oral Daily  . diltiazem  300 mg Oral Daily  . enoxaparin (LOVENOX) injection  40 mg Subcutaneous Q24H  . famotidine  20 mg Oral BID  . fluticasone  2 spray Each Nare BID  . furosemide  40 mg Intravenous BID  . insulin aspart  0-15 Units Subcutaneous TID WC  . methocarbamol  750 mg Oral TID  . metoprolol succinate  25 mg Oral Daily  . rOPINIRole  1 mg Oral QHS  . [START ON 06/30/2015] sertraline  100 mg Oral Daily  . sodium chloride flush  3 mL Intravenous Q12H  . traZODone  300 mg Oral QHS  . umeclidinium bromide  1 puff Inhalation Daily    Review of systems complete and found to be negative unless listed above    Physical Exam: Blood pressure 140/49, pulse 57, temperature 97.6 F (36.4 C), temperature source Oral, resp. rate 20, height   (1.727 m), weight 222 lb 0.1 oz (100.7 kg), SpO2 100 %.    General: Well developed, well nourished, NAD  HEENT: OP clear, mucus membranes moist  SKIN: warm, dry. No rashes.  Neuro: No focal deficits  Musculoskeletal: Muscle strength 5/5 all ext  Psychiatric: Mood and affect normal  Neck: No JVD, no carotid bruits, no thyromegaly, no lymphadenopathy.  Lungs:Clear bilaterally, no wheezes, rhonci, crackles  Cardiovascular: Regular rate and rhythm. No murmurs, gallops or rubs.  Abdomen:Soft. Bowel sounds present. Non-tender.  Extremities: 1+bilateral lower extremity edema. Pulses are 2 + in the bilateral DP/PT.  Labs:   Lab Results  Component Value Date   WBC 10.4 06/28/2015   HGB 11.8* 06/28/2015   HCT 38.1 06/28/2015   MCV 91.6 06/28/2015   PLT 270 06/28/2015    Recent Labs Lab 06/29/15 0228  NA 139  K 3.7  CL 87*  CO2 37*  BUN 18  CREATININE 1.14*  CALCIUM 8.3*  GLUCOSE 263*   Lab Results  Component Value Date   TROPONINI 0.03 06/29/2015    Echo October 2016: Left ventricle: The cavity size was normal. Wall thickness was  increased in a pattern of mild LVH. Systolic function was  moderately reduced. The estimated ejection fraction was in the  range of 35% to 40%. Diffuse hypokinesis with incoordinate septal  motion. Doppler parameters are consistent with abnormal left  ventricular relaxation (grade 1 diastolic dysfunction). The E/e&'  ratio is between 8-15, suggesting indeterminate LV filling  pressure. - Ventricular septum: Septal motion showed abnormal function and  dyssynergy. - Mitral valve: Mildly thickened leaflets . There was trivial  regurgitation. - Left atrium: The atrium was normal in size. - Right ventricle: The cavity size was moderately dilated. Wall  thickness was normal. Systolic function is reduced. Lateral  annulus peak S velocity: 9.7 cm/s. - Atrial septum: No defect or patent foramen ovale was identified. - Tricuspid valve: There  was mild regurgitation. -  Pulmonary arteries: PA peak pressure: 44 mm Hg (S). - Inferior vena cava: The vessel was dilated. The respirophasic  diameter changes were blunted (< 50%), consistent with elevated  central venous pressure.  Impressions:  - LVEF 35-40%, intraventricular dyssynchrony, diffuse hypokinesis,  diastolic dysfunction, indeterminate LV filling pressure, normal  atrial size, moderately dilated RV with hypokinesis, mild TR,  RVSP 44 mmHg.   Radiology:  Chest x-ray:  Cardiac shadow is enlarged. Pacing device is again seen and stable. The lungs are clear bilaterally. No acute bony abnormality is seen. IMPRESSION: No active cardiopulmonary disease.  EKG: Atrial paced, RBBB. No ischemic changes.   ASSESSMENT AND PLAN:   1. Acute on chronic combined systolic and diastolic CHF: She has known systolic and diastolic CHF with normal coronary arteries. She is volume overloaded. Agree with diuresis with IV Lasix. I/O are inaccurate as she has been flushing all urine without measuring. Will ask nursing to record all I/O.  2. Atrial fibrillation, paroxysmal: She is followed in the EP clinic by Dr. Ladona Ridgel. She is rate controlled with digoxin, Cardizem and metoprolol. She has close follow up in device clinic and has had no significant atrial fibrillation burden so she has not been on anti-coagulation.   3. Non-ischemic cardiomyopathy: She is on a beta blocker but she is no longer on the Ace-inh. She was on Lisinopril at time of d/c from [facility] in October 2016. This was stopped sometime between December and February. Echo pending in am.   4. Complete heart block: s/p permanent pacemaker. Recent interrogation and working well.    Signed: Earney Hamburg, MD 06/29/2015, 6:10 PM

## 2015-06-29 NOTE — Care Management Note (Signed)
Case Management Note Donn Pierini RN, BSN Unit 2W-Case Manager [phone]  Patient Details  Name: [name] MRN: [id number] Date of Birth: Jan 15, 1956  Subjective/Objective:  Pt here with c/p - placed in OBS- also tx for HF with lasix- hx COPD                  Action/Plan: PTA pt lived at home- PT eval pending- referral for COPD GOLD- pt has had 2 admits in last 6 mo- does not currently meet COPD GOLD protocol- PCP- FULLER, SUSAN CM to follow for recommendations and d/c needs    Expected Discharge Date:                  Expected Discharge Plan:  Home/Self Care  In-House Referral:     Discharge planning Services  CM Consult  Post Acute Care Choice:    Choice offered to:     DME Arranged:    DME Agency:     HH Arranged:    HH Agency:     Status of Service:  In process, will continue to follow  Medicare Important Message Given:    Date Medicare IM Given:    Medicare IM give by:    Date Additional Medicare IM Given:    Additional Medicare Important Message give by:     If discussed at Long Length of Stay Meetings, dates discussed:    Additional Comments:  Darrold Span, RN 06/29/2015, 10:32 AM

## 2015-06-29 NOTE — Progress Notes (Addendum)
HOSPITALIST DAILY PROGRESS NOTE Hospital Day:   Patient Name: [name] Admission Date/Time: 06/28/2015  9:18 PM  Age/Sex:59 y.o.female MRN#: [id number]  DOB: [birth year] Attending Provider: Janann August, MD   Consulting Physicians:  PCP/Outpatient Specialists: Patient Care Team: [name], NP as PCP - General (Nurse Practitioner) Marinus Maw, MD as Consulting Physician (Cardiology)     Brief Narrative: 60 Y/O CF admitted for c/o SOB, CP, swelling; CEs negative and treating as acute on chronic combined CHF.    Assessment:  Principal Problem:   Chest pain Active Problems:   Acute on chronic combined systolic and diastolic CHF (congestive heart failure) (HCC)   H/O Clostridium difficile infection   Coronary vasospasm (HCC)   Elevated serum creatinine   COPD (chronic obstructive pulmonary disease) (HCC)   Obesity (BMI 30.0-34.9)   GERD (gastroesophageal reflux disease)   Hyperlipidemia   Diabetes mellitus type 2 in obese (HCC)   Chronic pain   Hypertension   Paroxysmal atrial fibrillation (HCC)   Hypercapnic respiratory failure, chronic (HCC)   Pacemaker - Medtronic, inserted 2014   Anxiety state   RLS (restless legs syndrome)   Depression    Plan:  - suspect patient's symptoms related to acute on chronic combined CHF. Patient's primary cardiologist Dr. Ladona Ridgel. EF noted to be 35-40% in 12/2014 and cardiac cath in 12/2014 also without obstructive disease. BNP can be falsely normal given obesity. CXR however is not significant for fluid overload, appears stable from previous.  - Continue with IV lasix, monitor I/Os, fluid restrict, Low salt, monitor I/Os. Patient does not seem to know much about her CHF status, she will need education. She follows with Dr. Ladona Ridgel already.  - CEs negative, doubt ACS.  - patient should be better optimized with ACEi/ARB, however given abnormal renal function would hold off and defer as outpatient on starting. - continue her  metoprolol XL, digoxin, and diltiazem. Unclear why not on anticoagulation, defer to primary cardiologist.   - discussed possibility of reflux contributing, she reports h/o hiatal hernia as well. She does not follow with a gastroenterologist. Will provide PRN GI cocktail as well as increase her Pepcid to BID.  - anxiety related? Continue her xanax, zoloft and trazodone.  - Patient's creatinine is elevated from baseline of about 0.9. She reports no h/o renal dysfunction. Suspect also related to CHF, has improved with diuresis. Monitor, would like to see this improve further.  - COPD appears stable, continue maintenance inhalers/neb.  - follow up C diff study. No Sirs to suggest an active infection, diarrhea seems to be more so possibly IBS as she alternates with constipation. - sugars elevated, follow up A1c and continue SSI for now. Hold metformin while here.   **ADDENDUM: I spoke with [name] later in the afternoon regarding her steroid dose as it is listed as a home med. She states that she hasn't been on this daily and only was getting intermittent steroid injections by her PCP, for what sounds to be either for weakness or respiratory issues. We then discussed her CHF, she was unaware of having this. I inquired about recent stressors in life, she did lose multiple family members including her husband in July 2016. Was this takotsubo's? She hasn't been able to follow up with her primary cardiologist since 12/2014. I'll repeat the 2D ECHO to reassess function.  I will consult him for further assistance. In addition, we talked about why she is not on anticoagulation for afib. She does not know why.  She should be given her CHF, HTN, DM. I will see what cardiology wants to do with this as well. Then issues of her weakness came up, this could be mood related although having CHF could do this as well. I have offered to increase her zoloft, as she's been on this dose for quite some time. Patient needs good PCP  follow up otherwise to address this ongoing issues.   I examined the patient and reviewed the chart, labs and data. I discussed the patient's status and plan of care with Patient  DVT prophylaxis: Lovenox Code Status: Full Code Family Communication: none Disposition Plan: 1-2 days pending clinical response, If still no improvement tomorrow, will change to inpatient status.    Consultants:   None  Procedures:   None  Antimicrobials:   None   Subjective:  Patient doing about the same, some SOB and CP. Denies significant cough.    Objective:  Temp:  [98.1 F (36.7 C)-98.3 F (36.8 C)] 98.3 F (36.8 C) (04/26 0150) Pulse Rate:  [57-66] 66 (04/26 1032) Resp:  [8-24] 12 (04/26 0150) BP: (106-149)/(37-87) 139/43 mmHg (04/26 1032) SpO2:  [91 %-97 %] 96 % (04/26 0857) Weight:  [100.7 kg (222 lb 0.1 oz)-102.059 kg (225 lb)] 100.7 kg (222 lb 0.1 oz) (04/26 0150) SpO2 Readings from Last 3 Encounters:  06/29/15 96%  04/26/15 98%  01/05/15 98%   No intake or output data in the 24 hours ending 06/29/15 1248 Filed Weights   06/28/15 1641 06/29/15 0150  Weight: 102.059 kg (225 lb) 100.7 kg (222 lb 0.1 oz)   Body mass index is 33.76 kg/(m^2).   Examination: Physical Exam  Constitutional: She is oriented to person, place, and time and well-developed, well-nourished, and in no distress. No distress.  Cardiovascular: Normal rate, regular rhythm and normal heart sounds.   Pulmonary/Chest: Effort normal and breath sounds normal. No respiratory distress. She has no wheezes.  Abdominal: Soft. Bowel sounds are normal. She exhibits no distension. There is no tenderness.  Musculoskeletal: She exhibits edema (trace LEs). She exhibits no tenderness.  Neurological: She is alert and oriented to person, place, and time. GCS score is 15.  Skin: Skin is warm and dry. She is not diaphoretic. No erythema.      CBC:  Recent Labs Lab 06/28/15 1652  WBC 10.4  HGB 11.8*  HCT 38.1  MCV 91.6   PLT 270   Basic Metabolic Panel:  Recent Labs Lab 06/28/15 1652 06/29/15 0228  NA 141 139  K 3.7 3.7  CL 89* 87*  CO2 37* 37*  GLUCOSE 212* 263*  BUN 19 18  CREATININE 1.19* 1.14*  CALCIUM 8.6* 8.3*   GFR: Estimated Creatinine Clearance: 65.1 mL/min (by C-G formula based on Cr of 1.14).  Cardiac Enzymes:  Recent Labs Lab 06/29/15 0047 06/29/15 0636  TROPONINI 0.03 0.03    BNP (last 3 results)  Recent Labs  08/23/14 1005 06/28/15 1652  BNP 51.8 35.1    HbA1C: No results for input(s): HGBA1C in the last 72 hours. CBG:  Recent Labs Lab 06/29/15 0203 06/29/15 0620  GLUCAP 206* 338*   Lipid Profile: No results for input(s): CHOL, HDL, LDLCALC, TRIG, CHOLHDL, LDLDIRECT in the last 72 hours. Thyroid Function Tests:  Recent Labs  06/29/15 0047  TSH 2.064  FREET4 0.99    Radiology Studies: Dg Chest 2 View  06/28/2015  CLINICAL DATA:  Shortness of Breath EXAM: CHEST  2 VIEW COMPARISON:  04/25/2015 FINDINGS: Cardiac shadow is enlarged. Pacing  device is again seen and stable. The lungs are clear bilaterally. No acute bony abnormality is seen. IMPRESSION: No active cardiopulmonary disease. Electronically Signed   By: Alcide Clever M.D.   On: 06/28/2015 17:31     Medications:  Scheduled Meds: . ALPRAZolam  0.5 mg Oral TID  . arformoterol  15 mcg Nebulization BID  . aspirin EC  81 mg Oral BID  . atorvastatin  10 mg Oral QHS  . digoxin  250 mcg Oral Daily  . diltiazem  300 mg Oral Daily  . enoxaparin (LOVENOX) injection  40 mg Subcutaneous Q24H  . famotidine  20 mg Oral BID  . fluticasone  2 spray Each Nare BID  . furosemide  40 mg Intravenous BID  . insulin aspart  0-15 Units Subcutaneous TID WC  . methocarbamol  750 mg Oral TID  . metoprolol succinate  25 mg Oral Daily  . rOPINIRole  1 mg Oral QHS  . sertraline  50 mg Oral Daily  . sodium chloride flush  3 mL Intravenous Q12H  . traZODone  300 mg Oral QHS  . umeclidinium bromide  1 puff  Inhalation Daily   Continuous Infusions:  PRN Meds:  sodium chloride 250 mL PRN  acetaminophen 650 mg Q4H PRN  gi cocktail 30 mL TID PRN  HYDROcodone-acetaminophen 1 tablet TID PRN  ipratropium-albuterol 3 mL Q4H PRN  nitroGLYCERIN 0.4 mg Q5 Min x 3 PRN  ondansetron (ZOFRAN) IV 4 mg Q6H PRN  sodium chloride flush 3 mL PRN      Time spent: 35 minutes   Janann August, MD [facility]  If 7PM-7AM, please contact night-coverage [email or web address] Password [facility] 06/29/2015, 9:21 AM

## 2015-06-29 NOTE — Assessment & Plan Note (Deleted)
03/2004 NSTEMI/Cath: EF 45%, LM nl, LAD nl, D1 nl, D2 nl, LCX nl, OM1 large-nl, RCA-spasm noted, otw nl.

## 2015-06-29 NOTE — Progress Notes (Signed)
Inpatient Diabetes Program Recommendations  AACE/ADA: New Consensus Statement on Inpatient Glycemic Control (2015)  Target Ranges:  Prepandial:   less than 140 mg/dL      Peak postprandial:   less than 180 mg/dL (1-2 hours)      Critically ill patients:  140 - 180 mg/dL   Review of Glycemic Control Results for [name], [name] (MRN [id number]) as of 06/29/2015 13:39  Ref. Range 06/29/2015 02:03 06/29/2015 06:20 06/29/2015 12:23  Glucose-Capillary Latest Ref Range: 65-99 mg/dL 962 (H) 229 (H) 798 (H)   Diabetes history: DM Type 2 Outpatient Diabetes medications:  Novolog 3-8 units tid with meals + Metformin 1 gm bid Current orders for Inpatient glycemic control: Novolog correction moderate scale 0-15 tid  Inpatient Diabetes Program Recommendations:  Please consider adding basal insulin Lantus 20 units q hs + Novolog meal coverage 3 units tid (hold if eats < 50%). Noted A1c ordered.  Thank you, Billy Fischer. Randolf Sansoucie, RN, MSN, CDE Inpatient Glycemic Control Team Team Pager [phone] (8am-5pm) 06/29/2015 1:54 PM

## 2015-06-29 NOTE — Assessment & Plan Note (Deleted)
EF 35-40% ECHO 12/2014

## 2015-06-29 NOTE — Evaluation (Signed)
Physical Therapy Evaluation Patient Details Name: [name] MRN: [id number] DOB: Dec 04, 1955 Today's Date: 06/29/2015   History of Present Illness  pt presents with chest pain.  pt with hx of COPD, O2 Dependent, CHF, A-fib, DM, HTN, Depression, Anxiety, and Pacemaker.    Clinical Impression  Pt up independently and demonstrating great safety with mobility while managing her own O2.  No chest pain or DOE.  No further acute PT needs at this time.  Will sign off.      Follow Up Recommendations No PT follow up    Equipment Recommendations  None recommended by PT    Recommendations for Other Services       Precautions / Restrictions Precautions Precautions: None Restrictions Weight Bearing Restrictions: No      Mobility  Bed Mobility Overal bed mobility: Independent                Transfers Overall transfer level: Independent Equipment used: None                Ambulation/Gait Ambulation/Gait assistance: Independent Ambulation Distance (Feet): 300 Feet Assistive device: None Gait Pattern/deviations: WFL(Within Functional Limits)     General Gait Details: pt ambulated in hallway with good speed and negotiating around objects in hall while managing her O2 tank.    Stairs            Wheelchair Mobility    Modified Rankin (Stroke Patients Only)       Balance Overall balance assessment: Independent                                           Pertinent Vitals/Pain Pain Assessment: No/denies pain    Home Living Family/patient expects to be discharged to:: Private residence Living Arrangements: Children (21yr old grandson) Available Help at Discharge: Family;Available PRN/intermittently Type of Home: House Home Access: Stairs to enter Entrance Stairs-Rails: None Entrance Stairs-Number of Steps: 2 Home Layout: One level Home Equipment: None      Prior Function Level of Independence: Independent                Hand Dominance        Extremity/Trunk Assessment   Upper Extremity Assessment: Overall WFL for tasks assessed           Lower Extremity Assessment: Overall WFL for tasks assessed      Cervical / Trunk Assessment: Normal  Communication   Communication: No difficulties  Cognition Arousal/Alertness: Awake/alert Behavior During Therapy: WFL for tasks assessed/performed Overall Cognitive Status: Within Functional Limits for tasks assessed                      General Comments      Exercises        Assessment/Plan    PT Assessment Patent does not need any further PT services  PT Diagnosis Difficulty walking   PT Problem List    PT Treatment Interventions     PT Goals (Current goals can be found in the Care Plan section) Acute Rehab PT Goals Patient Stated Goal: Feel better. PT Goal Formulation: All assessment and education complete, DC therapy    Frequency     Barriers to discharge        Co-evaluation               End of Session Equipment Utilized During Treatment: Gait belt;Oxygen  Activity Tolerance: Patient tolerated treatment well Patient left: in bed;with call bell/phone within reach Nurse Communication: Mobility status    Functional Assessment Tool Used: Clinical Judgement Functional Limitation: Mobility: Walking and moving around Mobility: Walking and Moving Around Current Status [phone]): 0 percent impaired, limited or restricted Mobility: Walking and Moving Around Goal Status [phone]): 0 percent impaired, limited or restricted Mobility: Walking and Moving Around Discharge Status [phone]): 0 percent impaired, limited or restricted    Time: [id number] PT Time Calculation (min) (ACUTE ONLY): 16 min   Charges:   PT Evaluation $PT Eval Low Complexity: 1 Procedure     PT G Codes:   PT G-Codes **NOT FOR INPATIENT CLASS** Functional Assessment Tool Used: Clinical Judgement Functional Limitation: Mobility: Walking and moving  around Mobility: Walking and Moving Around Current Status (A2130): 0 percent impaired, limited or restricted Mobility: Walking and Moving Around Goal Status (Q6578): 0 percent impaired, limited or restricted Mobility: Walking and Moving Around Discharge Status (I6962): 0 percent impaired, limited or restricted    Sunny Schlein,  [phone] 06/29/2015, 11:18 AM

## 2015-06-29 NOTE — Progress Notes (Signed)
Called to receive report. ED nurse will call back.  Sandrea Hammond RN

## 2015-06-30 ENCOUNTER — Observation Stay (HOSPITAL_COMMUNITY): Payer: Medicare HMO

## 2015-06-30 ENCOUNTER — Encounter (HOSPITAL_COMMUNITY): Payer: Self-pay | Admitting: General Practice

## 2015-06-30 ENCOUNTER — Inpatient Hospital Stay (HOSPITAL_COMMUNITY): Payer: Medicare HMO

## 2015-06-30 DIAGNOSIS — J438 Other emphysema: Secondary | ICD-10-CM | POA: Diagnosis not present

## 2015-06-30 DIAGNOSIS — R071 Chest pain on breathing: Secondary | ICD-10-CM | POA: Diagnosis not present

## 2015-06-30 DIAGNOSIS — F411 Generalized anxiety disorder: Secondary | ICD-10-CM | POA: Diagnosis not present

## 2015-06-30 DIAGNOSIS — Z90710 Acquired absence of both cervix and uterus: Secondary | ICD-10-CM | POA: Diagnosis not present

## 2015-06-30 DIAGNOSIS — I110 Hypertensive heart disease with heart failure: Secondary | ICD-10-CM | POA: Diagnosis present

## 2015-06-30 DIAGNOSIS — K219 Gastro-esophageal reflux disease without esophagitis: Secondary | ICD-10-CM | POA: Diagnosis present

## 2015-06-30 DIAGNOSIS — J449 Chronic obstructive pulmonary disease, unspecified: Secondary | ICD-10-CM | POA: Diagnosis present

## 2015-06-30 DIAGNOSIS — G2581 Restless legs syndrome: Secondary | ICD-10-CM | POA: Diagnosis present

## 2015-06-30 DIAGNOSIS — I509 Heart failure, unspecified: Secondary | ICD-10-CM

## 2015-06-30 DIAGNOSIS — M7989 Other specified soft tissue disorders: Secondary | ICD-10-CM | POA: Insufficient documentation

## 2015-06-30 DIAGNOSIS — I429 Cardiomyopathy, unspecified: Secondary | ICD-10-CM | POA: Diagnosis not present

## 2015-06-30 DIAGNOSIS — F329 Major depressive disorder, single episode, unspecified: Secondary | ICD-10-CM | POA: Diagnosis present

## 2015-06-30 DIAGNOSIS — Z888 Allergy status to other drugs, medicaments and biological substances status: Secondary | ICD-10-CM | POA: Diagnosis not present

## 2015-06-30 DIAGNOSIS — Z7982 Long term (current) use of aspirin: Secondary | ICD-10-CM | POA: Diagnosis not present

## 2015-06-30 DIAGNOSIS — J418 Mixed simple and mucopurulent chronic bronchitis: Secondary | ICD-10-CM | POA: Diagnosis not present

## 2015-06-30 DIAGNOSIS — Z881 Allergy status to other antibiotic agents status: Secondary | ICD-10-CM | POA: Diagnosis not present

## 2015-06-30 DIAGNOSIS — Z885 Allergy status to narcotic agent status: Secondary | ICD-10-CM | POA: Diagnosis not present

## 2015-06-30 DIAGNOSIS — G8929 Other chronic pain: Secondary | ICD-10-CM | POA: Diagnosis not present

## 2015-06-30 DIAGNOSIS — Z79899 Other long term (current) drug therapy: Secondary | ICD-10-CM | POA: Diagnosis not present

## 2015-06-30 DIAGNOSIS — I5043 Acute on chronic combined systolic (congestive) and diastolic (congestive) heart failure: Secondary | ICD-10-CM | POA: Diagnosis not present

## 2015-06-30 DIAGNOSIS — J42 Unspecified chronic bronchitis: Secondary | ICD-10-CM | POA: Diagnosis not present

## 2015-06-30 DIAGNOSIS — E669 Obesity, unspecified: Secondary | ICD-10-CM | POA: Diagnosis present

## 2015-06-30 DIAGNOSIS — E119 Type 2 diabetes mellitus without complications: Secondary | ICD-10-CM | POA: Diagnosis present

## 2015-06-30 DIAGNOSIS — E785 Hyperlipidemia, unspecified: Secondary | ICD-10-CM | POA: Diagnosis present

## 2015-06-30 DIAGNOSIS — R072 Precordial pain: Secondary | ICD-10-CM | POA: Diagnosis present

## 2015-06-30 DIAGNOSIS — Z9981 Dependence on supplemental oxygen: Secondary | ICD-10-CM | POA: Diagnosis not present

## 2015-06-30 DIAGNOSIS — Z794 Long term (current) use of insulin: Secondary | ICD-10-CM | POA: Diagnosis not present

## 2015-06-30 DIAGNOSIS — Z6833 Body mass index (BMI) 33.0-33.9, adult: Secondary | ICD-10-CM | POA: Diagnosis not present

## 2015-06-30 DIAGNOSIS — J9692 Respiratory failure, unspecified with hypercapnia: Secondary | ICD-10-CM | POA: Diagnosis present

## 2015-06-30 DIAGNOSIS — I480 Paroxysmal atrial fibrillation: Secondary | ICD-10-CM | POA: Diagnosis not present

## 2015-06-30 DIAGNOSIS — Z7952 Long term (current) use of systemic steroids: Secondary | ICD-10-CM | POA: Diagnosis not present

## 2015-06-30 DIAGNOSIS — Z950 Presence of cardiac pacemaker: Secondary | ICD-10-CM | POA: Diagnosis not present

## 2015-06-30 DIAGNOSIS — I201 Angina pectoris with documented spasm: Secondary | ICD-10-CM | POA: Diagnosis present

## 2015-06-30 DIAGNOSIS — Z87891 Personal history of nicotine dependence: Secondary | ICD-10-CM | POA: Diagnosis not present

## 2015-06-30 LAB — COMPREHENSIVE METABOLIC PANEL
ALBUMIN: 3.2 g/dL — AB (ref 3.5–5.0)
ALK PHOS: 43 U/L (ref 38–126)
ALT: 26 U/L (ref 14–54)
AST: 27 U/L (ref 15–41)
Anion gap: 11 (ref 5–15)
BUN: 18 mg/dL (ref 6–20)
CALCIUM: 8.4 mg/dL — AB (ref 8.9–10.3)
CHLORIDE: 91 mmol/L — AB (ref 101–111)
CO2: 38 mmol/L — AB (ref 22–32)
CREATININE: 1.16 mg/dL — AB (ref 0.44–1.00)
GFR calc non Af Amer: 50 mL/min — ABNORMAL LOW (ref >60)
GFR, EST AFRICAN AMERICAN: 58 mL/min — AB (ref >60)
GLUCOSE: 278 mg/dL — AB (ref 65–99)
Potassium: 3.3 mmol/L — ABNORMAL LOW (ref 3.5–5.1)
SODIUM: 140 mmol/L (ref 135–145)
Total Bilirubin: 0.3 mg/dL (ref 0.3–1.2)
Total Protein: 6.2 g/dL — ABNORMAL LOW (ref 6.5–8.1)

## 2015-06-30 LAB — HEMOGLOBIN A1C
HEMOGLOBIN A1C: 6.4 % — AB (ref 4.8–5.6)
MEAN PLASMA GLUCOSE: 137 mg/dL

## 2015-06-30 LAB — CBC WITH DIFFERENTIAL/PLATELET
BASOS ABS: 0 10*3/uL (ref 0.0–0.1)
BASOS PCT: 0 %
EOS ABS: 0.1 10*3/uL (ref 0.0–0.7)
EOS PCT: 2 %
HCT: 34.3 % — ABNORMAL LOW (ref 36.0–46.0)
HEMOGLOBIN: 10.3 g/dL — AB (ref 12.0–15.0)
Lymphocytes Relative: 21 %
Lymphs Abs: 1.4 10*3/uL (ref 0.7–4.0)
MCH: 27.8 pg (ref 26.0–34.0)
MCHC: 30 g/dL (ref 30.0–36.0)
MCV: 92.5 fL (ref 78.0–100.0)
MONO ABS: 0.5 10*3/uL (ref 0.1–1.0)
MONOS PCT: 7 %
NEUTROS PCT: 70 %
Neutro Abs: 4.7 10*3/uL (ref 1.7–7.7)
PLATELETS: 246 10*3/uL (ref 150–400)
RBC: 3.71 MIL/uL — AB (ref 3.87–5.11)
RDW: 13.9 % (ref 11.5–15.5)
WBC: 6.8 10*3/uL (ref 4.0–10.5)

## 2015-06-30 LAB — LIPID PANEL
CHOL/HDL RATIO: 3.9 ratio
Cholesterol: 116 mg/dL (ref 0–200)
HDL: 30 mg/dL — AB (ref >40)
LDL Cholesterol: 27 mg/dL (ref 0–99)
Triglycerides: 297 mg/dL — ABNORMAL HIGH (ref <150)
VLDL: 59 mg/dL — AB (ref 0–40)

## 2015-06-30 LAB — PHOSPHORUS: PHOSPHORUS: 3 mg/dL (ref 2.5–4.6)

## 2015-06-30 LAB — ECHOCARDIOGRAM COMPLETE
HEIGHTINCHES: 68 in
WEIGHTICAEL: 3559.11 [oz_av]

## 2015-06-30 LAB — MAGNESIUM: Magnesium: 1.9 mg/dL (ref 1.7–2.4)

## 2015-06-30 LAB — CREATININE, URINE, RANDOM: CREATININE, URINE: 78.33 mg/dL

## 2015-06-30 LAB — GLUCOSE, CAPILLARY
GLUCOSE-CAPILLARY: 244 mg/dL — AB (ref 65–99)
GLUCOSE-CAPILLARY: 273 mg/dL — AB (ref 65–99)
Glucose-Capillary: 322 mg/dL — ABNORMAL HIGH (ref 65–99)
Glucose-Capillary: 214 mg/dL — ABNORMAL HIGH (ref 65–99)

## 2015-06-30 MED ORDER — POTASSIUM CHLORIDE 20 MEQ/15ML (10%) PO SOLN
40.0000 meq | Freq: Once | ORAL | Status: AC
  Administered 2015-07-01: 40 meq via ORAL
  Filled 2015-06-30: qty 30

## 2015-06-30 MED ORDER — INSULIN GLARGINE 100 UNIT/ML ~~LOC~~ SOLN
10.0000 [IU] | Freq: Every day | SUBCUTANEOUS | Status: DC
  Administered 2015-06-30 – 2015-07-02 (×3): 10 [IU] via SUBCUTANEOUS
  Filled 2015-06-30 (×4): qty 0.1

## 2015-06-30 MED ORDER — DIGOXIN 125 MCG PO TABS
0.1250 mg | ORAL_TABLET | Freq: Every day | ORAL | Status: DC
  Administered 2015-07-02 – 2015-07-03 (×2): 0.125 mg via ORAL
  Filled 2015-06-30 (×3): qty 1

## 2015-06-30 NOTE — Progress Notes (Signed)
Echocardiogram 2D Echocardiogram has been performed.  [name] 06/30/2015, 9:33 AM

## 2015-06-30 NOTE — Progress Notes (Addendum)
HOSPITALIST DAILY PROGRESS NOTE Hospital Day: 0  Patient Name: [name] Admission Date/Time: 06/28/2015  9:18 PM  Age/Sex:59 y.o.female MRN#: [id number]  DOB: 03-20-1955 Attending Provider: Janann August, MD   LOS: 0 days  Consulting Physicians:  PCP/Outpatient Specialists: Patient Care Team: [name], NP as PCP - General (Nurse Practitioner) Marinus Maw, MD as Consulting Physician (Cardiology)     Brief Narrative:  60 Y/O CF admitted for c/o SOB, CP, swelling; CEs negative and treating as acute on chronic combined CHF. Cardiology also on board.   Assessment:   Active Hospital Problems   Diagnosis Date Noted  . Acute on chronic combined systolic and diastolic CHF (congestive heart failure) (HCC)     Priority: High  . Elevated serum creatinine 06/29/2015    Priority: Medium  . Coronary vasospasm (HCC)     Priority: Medium  . H/O Clostridium difficile infection     Priority: Medium  . Pacemaker - [employer], inserted 2014 12/10/2012    Priority: Medium  . Paroxysmal atrial fibrillation (HCC) 10/14/2012    Priority: Medium  . Depression 06/29/2015  . RLS (restless legs syndrome) 12/26/2012  . Anxiety state 12/26/2012  . Hypercapnic respiratory failure, chronic (HCC) 10/19/2012  . Hypertension 03/12/2012  . Diabetes mellitus type 2 in obese (HCC) 02/29/2012  . GERD (gastroesophageal reflux disease) 02/29/2012  . Hyperlipidemia 02/29/2012  . Chronic pain 02/29/2012  . COPD (chronic obstructive pulmonary disease) (HCC) 07/10/2010  . Obesity (BMI 30.0-34.9) 07/10/2010    Resolved Hospital Problems   Diagnosis Date Noted Date Resolved  . Chest pain  06/30/2015     Plan:  - suspect patient's symptoms related to acute on chronic combined CHF. Patient's primary cardiologist Dr. Ladona Ridgel. EF noted to be 35-40% in 12/2014 and cardiac cath in 12/2014 also without obstructive disease. BNP can be falsely normal given obesity. CXR however is not significant for fluid  overload, appears stable from previous.  - appreciate cardiology's assistance, 2D ECHO pending - reassess cardiac function since previous ECHO in 12/2014 - Continue with IV lasix, monitor I/Os, fluid restrict, Low salt. She is finally net negative.  - CEs negative, doubt ACS.  - patient should be better optimized with ACEi/ARB, however given abnormal renal function would hold off and defer as outpatient on starting. - continue her metoprolol XL, digoxin, and diltiazem. Her Digoxin level was in normal range, given renal dysfunction currently however we may need to consider dc'ing. Spoke with Dr. Clifton James who reviewed Dr. Lubertha Basque notes, she does not have significant issue with afib and therefore she only takes asa 81 mg BID. She is s/p pacemaker due to h/o complete heart block.  - discussed possibility of reflux contributing, she reports h/o hiatal hernia as well. She does not follow with a gastroenterologist. provide PRN GI cocktail as well as increased her Pepcid to BID. Seems to have also helped.  - anxiety related? Continue her xanax, zoloft and trazodone; just increased her zoloft to 100 mg 4/26.  - Patient's creatinine is elevated from baseline of about 0.9. She reports no h/o renal dysfunction. Suspect also related to CHF, has improved with diuresis, but now has stabilized today. Monitor, would like to see this improve further. Will be thorough and proceed with renal U/S. Urine studies will be inaccurate given we are diuresing. Will get FeUrea. Advised that she needs this to be followed closely by her PCP.  - COPD appears stable, continue maintenance inhalers/neb.  - negative C diff. No Sirs to suggest  an active infection, diarrhea seems to be more so possibly IBS as she alternates with constipation. Supportive care PRN. - sugars elevated,  A1c great at 6.4, continue SSI for now, add lantus for now at bedtime. Hold metformin while here, can resume at home.   I examined the patient and  reviewed the chart, labs and data. I discussed the patient's status and plan of care with Patient's treatment team  DVT prophylaxis:  Lovenox 40 mg subq q24h  Code Status: Full Code Family Communication: None Disposition Plan: Hopefully discharge 1-2 days pending her clinical response and renal failure. (I have changed her to inpatient status)   Consultants:  CONSULT TO CARE MANAGEMENT CONSULT TO SOCIAL WORK IP CONSULT TO CARDIOLOGY - Dr. Clifton James  Procedures:   None  Antimicrobials:  None  Subjective:  Patient reports feeling better today. No CP or SOB. Swelling of legs and hands seem to be improving. No cough.    Objective:  Temp:  [97.6 F (36.4 C)-98 F (36.7 C)] 98 F (36.7 C) (04/27 0531) Pulse Rate:  [57-66] 59 (04/27 0700) Resp:  [16-20] 18 (04/27 0700) BP: (120-140)/(43-92) 140/51 mmHg (04/27 0531) SpO2:  [96 %-100 %] 97 % (04/27 0700) Weight:  [100.9 kg (222 lb 7.1 oz)] 100.9 kg (222 lb 7.1 oz) (04/27 0531) SpO2 Readings from Last 3 Encounters:  06/30/15 97%  04/26/15 98%  01/05/15 98%    Intake/Output Summary (Last 24 hours) at 06/30/15 0840 Last data filed at 06/29/15 2241  Gross per 24 hour  Intake    363 ml  Output    800 ml  Net   -437 ml   Filed Weights   06/28/15 1641 06/29/15 0150 06/30/15 0531  Weight: 102.059 kg (225 lb) 100.7 kg (222 lb 0.1 oz) 100.9 kg (222 lb 7.1 oz)   Body mass index is 33.83 kg/(m^2).   Physical Exam  Constitutional: She is oriented to person, place, and time and well-developed, well-nourished, and in no distress. No distress.  Cardiovascular: Normal rate, regular rhythm and normal heart sounds.   No murmur heard. Pulmonary/Chest: Effort normal and breath sounds normal. No respiratory distress. She has no wheezes. She has no rales.  Abdominal: Soft. Bowel sounds are normal. She exhibits no distension. There is no tenderness.  Musculoskeletal: She exhibits edema (trace around ankles). She exhibits no tenderness.    Neurological: She is alert and oriented to person, place, and time. GCS score is 15.  Skin: Skin is warm and dry. She is not diaphoretic.     Medications:  Scheduled Meds: . ALPRAZolam  0.5 mg Oral TID  . arformoterol  15 mcg Nebulization BID  . aspirin EC  81 mg Oral BID  . atorvastatin  10 mg Oral QHS  . digoxin  250 mcg Oral Daily  . diltiazem  300 mg Oral Daily  . enoxaparin (LOVENOX) injection  40 mg Subcutaneous Q24H  . famotidine  20 mg Oral BID  . fluticasone  2 spray Each Nare BID  . furosemide  40 mg Intravenous BID  . insulin aspart  0-15 Units Subcutaneous TID WC  . methocarbamol  750 mg Oral TID  . metoprolol succinate  25 mg Oral Daily  . potassium chloride  40 mEq Oral Once  . rOPINIRole  1 mg Oral QHS  . sertraline  100 mg Oral Daily  . sodium chloride flush  3 mL Intravenous Q12H  . traZODone  300 mg Oral QHS  . umeclidinium bromide  1 puff Inhalation  Daily   Continuous Infusions:  PRN Meds:  sodium chloride 250 mL PRN  acetaminophen 650 mg Q4H PRN  gi cocktail 30 mL TID PRN  HYDROcodone-acetaminophen 1 tablet TID PRN  ipratropium-albuterol 3 mL Q4H PRN  nitroGLYCERIN 0.4 mg Q5 Min x 3 PRN  ondansetron (ZOFRAN) IV 4 mg Q6H PRN  sodium chloride flush 3 mL PRN     Labs:  CBC:  Recent Labs Lab 06/28/15 1652 06/30/15 0225  WBC 10.4 6.8  NEUTROABS  --  4.7  HGB 11.8* 10.3*  HCT 38.1 34.3*  MCV 91.6 92.5  PLT 270 246    Basic Metabolic Panel:  Recent Labs Lab 06/28/15 1652 06/29/15 0228 06/30/15 0225  GLUCOSE 212* 263* 278*  NA 141 139 140  K 3.7 3.7 3.3*  CL 89* 87* 91*  CO2 37* 37* 38*  BUN 19 18 18   CREATININE 1.19* 1.14* 1.16*  CALCIUM 8.6* 8.3* 8.4*  MG  --   --  1.9  PHOS  --   --  3.0    GFR: Estimated Creatinine Clearance: 64.1 mL/min (by C-G formula based on Cr of 1.16).  CBG:  Recent Labs Lab 06/29/15 0620 06/29/15 1223 06/29/15 1608 06/29/15 2107 06/30/15 0627  GLUCAP 338* 227* 321* 266* 244*    Liver  Function Tests:  Recent Labs Lab 06/30/15 0225  AST 27  ALT 26  ALKPHOS 43  BILITOT 0.3  PROT 6.2*  ALBUMIN 3.2*   Cardiac Enzymes:  Recent Labs Lab 06/29/15 0047 06/29/15 0636 06/29/15 1214  TROPONINI 0.03 0.03 0.03    BNP (last 3 results)  Recent Labs  08/23/14 1005 06/28/15 1652  BNP 51.8 35.1    Latest HbA1C/Lipids/Thyroid/Anemia: Lab Results  Component Value Date   HGBA1C 6.4* 06/29/2015   CHOL 116 06/30/2015   HDL 30* 06/30/2015   LDLCALC 27 06/30/2015   TRIG 297* 06/30/2015   CHOLHDL 3.9 06/30/2015   TSH 2.064 06/29/2015    Recent Results (from the past 240 hour(s))  C difficile quick scan w PCR reflex     Status: None   Collection Time: 06/29/15  2:41 PM  Result Value Ref Range Status   C Diff antigen NEGATIVE NEGATIVE Final   C Diff toxin NEGATIVE NEGATIVE Final   C Diff interpretation Negative for toxigenic C. difficile  Final     Radiology Studies: Dg Chest 2 View  06/28/2015  CLINICAL DATA:  Shortness of Breath EXAM: CHEST  2 VIEW COMPARISON:  04/25/2015 FINDINGS: Cardiac shadow is enlarged. Pacing device is again seen and stable. The lungs are clear bilaterally. No acute bony abnormality is seen. IMPRESSION: No active cardiopulmonary disease. Electronically Signed   By: Alcide Clever M.D.   On: 06/28/2015 17:31    Time spent: 35 minutes   Janann August, MD [facility]  If 7PM-7AM, please contact night-coverage [email or web address] Password [email or web address] 06/30/2015, 8:40 AM

## 2015-06-30 NOTE — Progress Notes (Signed)
SUBJECTIVE: No chest pain. Still with dyspnea.   BP 140/51 mmHg  Pulse 59  Temp(Src) 98 F (36.7 C) (Oral)  Resp 18  Ht  (1.727 m)  Wt 222 lb 7.1 oz (100.9 kg)  BMI 33.83 kg/m2  SpO2 97%  Intake/Output Summary (Last 24 hours) at 06/30/15 0749 Last data filed at 06/29/15 2241  Gross per 24 hour  Intake    363 ml  Output    800 ml  Net   -437 ml    PHYSICAL EXAM General: Well developed, well nourished, in no acute distress. Alert and oriented x 3.  Psych:  Good affect, responds appropriately Neck: No JVD. No masses noted.  Lungs: Clear bilaterally with no wheezes or rhonci noted.  Heart: RRR with no murmurs noted. Abdomen: Bowel sounds are present. Soft, non-tender.  Extremities: No lower extremity edema.   LABS: Basic Metabolic Panel:  Recent Labs  16/10/96 0228 06/30/15 0225  NA 139 140  K 3.7 3.3*  CL 87* 91*  CO2 37* 38*  GLUCOSE 263* 278*  BUN 18 18  CREATININE 1.14* 1.16*  CALCIUM 8.3* 8.4*  MG  --  1.9  PHOS  --  3.0   CBC:  Recent Labs  06/28/15 1652 06/30/15 0225  WBC 10.4 6.8  NEUTROABS  --  4.7  HGB 11.8* 10.3*  HCT 38.1 34.3*  MCV 91.6 92.5  PLT 270 246   Cardiac Enzymes:  Recent Labs  06/29/15 0047 06/29/15 0636 06/29/15 1214  TROPONINI 0.03 0.03 0.03   Fasting Lipid Panel:  Recent Labs  06/30/15 0225  CHOL 116  HDL 30*  LDLCALC 27  TRIG 045*  CHOLHDL 3.9    Current Meds: . ALPRAZolam  0.5 mg Oral TID  . arformoterol  15 mcg Nebulization BID  . aspirin EC  81 mg Oral BID  . atorvastatin  10 mg Oral QHS  . digoxin  250 mcg Oral Daily  . diltiazem  300 mg Oral Daily  . enoxaparin (LOVENOX) injection  40 mg Subcutaneous Q24H  . famotidine  20 mg Oral BID  . fluticasone  2 spray Each Nare BID  . furosemide  40 mg Intravenous BID  . insulin aspart  0-15 Units Subcutaneous TID WC  . methocarbamol  750 mg Oral TID  . metoprolol succinate  25 mg Oral Daily  . potassium chloride  40 mEq Oral Once  .  rOPINIRole  1 mg Oral QHS  . sertraline  100 mg Oral Daily  . sodium chloride flush  3 mL Intravenous Q12H  . traZODone  300 mg Oral QHS  . umeclidinium bromide  1 puff Inhalation Daily     ASSESSMENT AND PLAN:  1. Acute on chronic combined systolic and diastolic CHF: She has known systolic and diastolic CHF with normal coronary arteries. She has been diuresing on IV lasix but is still volume overloaded. Agree with continued diuresis with IV Lasix. I/O from first 24 hours of hospital stay are inaccurate as she was flushing all urine without measuring.  Nursing is now recording I/O.   2. Atrial fibrillation, paroxysmal: She is followed in the EP clinic by Dr. Ladona Ridgel. She is rate controlled with digoxin, Cardizem and metoprolol. She has close follow up in device clinic and has had no significant atrial fibrillation burden so she has not been on anti-coagulation.   3. Non-ischemic cardiomyopathy: She is on a beta blocker but she is no longer on the Ace-inh. She was on  Lisinopril at time of d/c from [facility] in October 2016. This was stopped sometime between December and February. Echo pending in am.   4. Complete heart block: s/p permanent pacemaker. Recent interrogation and working well.   MCALHANY,CHRISTOPHER  4/27/20177:49 AM

## 2015-07-01 DIAGNOSIS — R071 Chest pain on breathing: Secondary | ICD-10-CM

## 2015-07-01 LAB — BRAIN NATRIURETIC PEPTIDE: B NATRIURETIC PEPTIDE 5: 42.6 pg/mL (ref 0.0–100.0)

## 2015-07-01 LAB — BASIC METABOLIC PANEL
ANION GAP: 10 (ref 5–15)
BUN: 14 mg/dL (ref 6–20)
CALCIUM: 9 mg/dL (ref 8.9–10.3)
CO2: 38 mmol/L — ABNORMAL HIGH (ref 22–32)
CREATININE: 1.11 mg/dL — AB (ref 0.44–1.00)
Chloride: 91 mmol/L — ABNORMAL LOW (ref 101–111)
GFR calc non Af Amer: 53 mL/min — ABNORMAL LOW (ref >60)
Glucose, Bld: 334 mg/dL — ABNORMAL HIGH (ref 65–99)
Potassium: 3.7 mmol/L (ref 3.5–5.1)
SODIUM: 139 mmol/L (ref 135–145)

## 2015-07-01 LAB — GLUCOSE, CAPILLARY
Glucose-Capillary: 278 mg/dL — ABNORMAL HIGH (ref 65–99)
Glucose-Capillary: 306 mg/dL — ABNORMAL HIGH (ref 65–99)
Glucose-Capillary: 269 mg/dL — ABNORMAL HIGH (ref 65–99)
Glucose-Capillary: 268 mg/dL — ABNORMAL HIGH (ref 65–99)

## 2015-07-01 LAB — UREA NITROGEN, URINE: Urea Nitrogen, Ur: 526 mg/dL

## 2015-07-01 NOTE — Progress Notes (Signed)
[facility] PROGRESS NOTE  ADASYN MCADAMS NWG:[id number] DOB: 15-Feb-1956 DOA: 06/28/2015   PCP: [name], NP PCP/Outpatient Specialists: Patient Care Team: [name], NP as PCP - General (Nurse Practitioner) Marinus Maw, MD as Consulting Physician (Cardiology)     Assessment/Plan: Principal Problem:   Acute on chronic combined systolic and diastolic CHF (congestive heart failure) (HCC) Active Problems:   COPD (chronic obstructive pulmonary disease) (HCC)   Obesity (BMI 30.0-34.9)   GERD (gastroesophageal reflux disease)   Hyperlipidemia   Diabetes mellitus type 2 in obese (HCC)   Chronic pain   Hypertension   Paroxysmal atrial fibrillation (HCC)   Hypercapnic respiratory failure, chronic (HCC)   Pacemaker - [employer], inserted 2014   Anxiety state   RLS (restless legs syndrome)   H/O Clostridium difficile infection   Coronary vasospasm (HCC)   Depression   Elevated serum creatinine   Swelling of lower extremity    HPI: 60 yo female with history of HLD, HTN, COPD, morbid obesity, coronary vasospasm but no CAD, DM, chronic combined CHF, complete heart block s/p PPM, PAF admitted with dyspnea, LE edema and chest pain. Most recent echo October 2016 with LVEF=35-40%. Cardiac cath October 2016 with no evidence of CAD. She reports dyspnea and chest pressure for several weeks along with increased LE edema and 11 lb weight gain. She had an URI and was given steroids and that is when she noticed the above symptoms. Troponin negative x 2. EKG with atrial pacing, RBBB. No ischemic changes. She is on IV Lasix and is having increased urination  Endorses orthopnea for 2 weeks   Assessment and plan Acute on chronic combined systolic and diastolic CHF: 2-D echo this admission shows an EF of 50-55%, compared to last echo systolic function has improved. She has known systolic and diastolic CHF with normal coronary arteries. She is volume overloaded. Continue IV Lasix.3900  cc in the last 24 hours. Strict I's and os..   patient should be better optimized with ACEi/ARB, however given abnormal renal function would hold off and defer as outpatient on starting    Atrial fibrillation, paroxysmal: She is followed in the EP clinic by Dr. Ladona Ridgel. She is rate controlled with digoxin, Cardizem and metoprolol. She has close follow up in device clinic and has had no significant atrial fibrillation burden so she has not been on anti-coagulation. Currently on aspirin 81 mg a day.   Non-ischemic cardiomyopathy: She is on a beta blocker but she is no longer on the Ace-inh. She was on Lisinopril at time of d/c from [facility] in October 2016. This was stopped sometime between December and February. EchResults as above.  currently on digoxin 0.125 mg a day, Cardizem 300 mg a day, Toprol-XL 25 mg a day,    Complete heart block: s/p permanent pacemaker. Recent interrogation and working well.    Diabetes mellitus insulin-dependent, continue Lantus and sliding scale insulin, hemoglobin A1c 6.4, hold metformin   anxiety related? Continue her xanax, zoloft and trazodone; just increased her zoloft to 100 mg 4/26.   Diarrhea-C. difficile negative  - COPD without exacerbation  appears stable, continue maintenance inhalers/neb.     DVT prophylaxsis  Lovenox   Code Status:  Full code     Family Communication: Discussed in detail with the patient, all imaging results, lab results explained to the patient   Disposition Plan:  discharge 1-2 days pending her clinical response and renal failure. (I have changed her to inpatient status  consultations-cardiology     Procedures None  Antibiotics: Anti-infectives    None         HPI/Subjective: Patient has improvement in her shortness of breath  Objective: Filed Vitals:   06/30/15 2036 07/01/15 0537 07/01/15 0700 07/01/15 0730  BP: 121/58 140/52    Pulse: 59 62 62 62  Temp: 98.2 F (36.8 C) 97.8 F (36.6 C)     TempSrc: Oral Oral    Resp: 18 18 18 18   Height:      Weight:  101.2 kg (223 lb 1.7 oz)    SpO2: 96% 97% 98% 98%    Intake/Output Summary (Last 24 hours) at 07/01/15 0817 Last data filed at 07/01/15 0557  Gross per 24 hour  Intake    120 ml  Output   3900 ml  Net  -3780 ml    Exam:  Examination:  General exam: Appears calm and comfortable  Respiratory system: Clear to auscultation. Respiratory effort normal. Cardiovascular system: S1 & S2 heard, RRR. No JVD, murmurs, rubs, gallops or clicks. No pedal edema. Gastrointestinal system: Abdomen is nondistended, soft and nontender. No organomegaly or masses felt. Normal bowel sounds heard. Central nervous system: Alert and oriented. No focal neurological deficits. Extremities: 1+bilateral lower extremity edema. Pulses are 2 + in the bilateral DP/PT Skin: No rashes, lesions or ulcers Psychiatry: Judgement and insight appear normal. Mood & affect appropriate.     Data Reviewed: I have personally reviewed following labs and imaging studies  Micro Results Recent Results (from the past 240 hour(s))  C difficile quick scan w PCR reflex     Status: None   Collection Time: 06/29/15  2:41 PM  Result Value Ref Range Status   C Diff antigen NEGATIVE NEGATIVE Final   C Diff toxin NEGATIVE NEGATIVE Final   C Diff interpretation Negative for toxigenic C. difficile  Final    Radiology Reports Dg Chest 2 View  06/28/2015  CLINICAL DATA:  Shortness of Breath EXAM: CHEST  2 VIEW COMPARISON:  04/25/2015 FINDINGS: Cardiac shadow is enlarged. Pacing device is again seen and stable. The lungs are clear bilaterally. No acute bony abnormality is seen. IMPRESSION: No active cardiopulmonary disease. Electronically Signed   By: Alcide Clever M.D.   On: 06/28/2015 17:31   US Renal  06/30/2015  CLINICAL DATA:  Renal failure. EXAM: RENAL / URINARY TRACT ULTRASOUND COMPLETE COMPARISON:  None. FINDINGS: Right Kidney: Length: 13 cm. Echogenicity within  normal limits. No mass or hydronephrosis visualized. Mild cortical thinning. Left Kidney: Length: 11.6 cm. Echogenicity within normal limits. No mass or hydronephrosis visualized. Mild cortical thinning. Bladder: Appears normal for degree of bladder distention. IMPRESSION: No evidence of hydronephrosis. Both kidneys demonstrate mild cortical atrophy. Electronically Signed   By: Irish Lack M.D.   On: 06/30/2015 13:37     CBC  Recent Labs Lab 06/28/15 1652 06/30/15 0225  WBC 10.4 6.8  HGB 11.8* 10.3*  HCT 38.1 34.3*  PLT 270 246  MCV 91.6 92.5  MCH 28.4 27.8  MCHC 31.0 30.0  RDW 13.9 13.9  LYMPHSABS  --  1.4  MONOABS  --  0.5  EOSABS  --  0.1  BASOSABS  --  0.0    Chemistries   Recent Labs Lab 06/28/15 1652 06/29/15 0228 06/30/15 0225 07/01/15 0218  NA 141 139 140 139  K 3.7 3.7 3.3* 3.7  CL 89* 87* 91* 91*  CO2 37* 37* 38* 38*  GLUCOSE 212* 263* 278* 334*  BUN 19 18  18 14  CREATININE 1.19* 1.14* 1.16* 1.11*  CALCIUM 8.6* 8.3* 8.4* 9.0  MG  --   --  1.9  --   AST  --   --  27  --   ALT  --   --  26  --   ALKPHOS  --   --  43  --   BILITOT  --   --  0.3  --    ------------------------------------------------------------------------------------------------------------------ estimated creatinine clearance is 67 mL/min (by C-G formula based on Cr of 1.11). ------------------------------------------------------------------------------------------------------------------  Recent Labs  06/29/15 0047  HGBA1C 6.4*   ------------------------------------------------------------------------------------------------------------------  Recent Labs  06/30/15 0225  CHOL 116  HDL 30*  LDLCALC 27  TRIG 852*  CHOLHDL 3.9   ------------------------------------------------------------------------------------------------------------------  Recent Labs  06/29/15 0047  TSH 2.064    ------------------------------------------------------------------------------------------------------------------ No results for input(s): VITAMINB12, FOLATE, FERRITIN, TIBC, IRON, RETICCTPCT in the last 72 hours.  Coagulation profile No results for input(s): INR, PROTIME in the last 168 hours.   Recent Labs  06/28/15 1652  DDIMER 0.37    Cardiac Enzymes  Recent Labs Lab 06/29/15 0047 06/29/15 0636 06/29/15 1214  TROPONINI 0.03 0.03 0.03   ------------------------------------------------------------------------------------------------------------------ Invalid input(s): POCBNP   CBG:  Recent Labs Lab 06/30/15 0627 06/30/15 1236 06/30/15 1642 06/30/15 2031 07/01/15 0554  GLUCAP 244* 322* 214* 273* 278*       Studies: US Renal  06/30/2015  CLINICAL DATA:  Renal failure. EXAM: RENAL / URINARY TRACT ULTRASOUND COMPLETE COMPARISON:  None. FINDINGS: Right Kidney: Length: 13 cm. Echogenicity within normal limits. No mass or hydronephrosis visualized. Mild cortical thinning. Left Kidney: Length: 11.6 cm. Echogenicity within normal limits. No mass or hydronephrosis visualized. Mild cortical thinning. Bladder: Appears normal for degree of bladder distention. IMPRESSION: No evidence of hydronephrosis. Both kidneys demonstrate mild cortical atrophy. Electronically Signed   By: Irish Lack M.D.   On: 06/30/2015 13:37      Lab Results  Component Value Date   HGBA1C 6.4* 06/29/2015   HGBA1C 7.8* 12/31/2014   HGBA1C 6.9* 08/23/2014   Lab Results  Component Value Date   LDLCALC 27 06/30/2015   CREATININE 1.11* 07/01/2015       Scheduled Meds: . ALPRAZolam  0.5 mg Oral TID  . arformoterol  15 mcg Nebulization BID  . aspirin EC  81 mg Oral BID  . atorvastatin  10 mg Oral QHS  . [START ON 07/02/2015] digoxin  0.125 mg Oral Daily  . diltiazem  300 mg Oral Daily  . enoxaparin (LOVENOX) injection  40 mg Subcutaneous Q24H  . famotidine  20 mg Oral BID  .  fluticasone  2 spray Each Nare BID  . furosemide  40 mg Intravenous BID  . insulin aspart  0-15 Units Subcutaneous TID WC  . insulin glargine  10 Units Subcutaneous QHS  . methocarbamol  750 mg Oral TID  . metoprolol succinate  25 mg Oral Daily  . potassium chloride  40 mEq Oral Once  . rOPINIRole  1 mg Oral QHS  . sertraline  100 mg Oral Daily  . sodium chloride flush  3 mL Intravenous Q12H  . traZODone  300 mg Oral QHS  . umeclidinium bromide  1 puff Inhalation Daily   Continuous Infusions:    LOS: 1 day    Time spent: >30 MINS    [facility]  [facility] Pager [phone]. If 7PM-7AM, please contact night-coverage at [email or web address], password [facility] 07/01/2015, 8:17 AM  LOS: 1 day

## 2015-07-01 NOTE — Progress Notes (Signed)
Inpatient Diabetes Program Recommendations  AACE/ADA: New Consensus Statement on Inpatient Glycemic Control (2015)  Target Ranges:  Prepandial:   less than 140 mg/dL      Peak postprandial:   less than 180 mg/dL (1-2 hours)      Critically ill patients:  140 - 180 mg/dL   Review of Glycemic Control  Inpatient Diabetes Program Recommendations:  Insulin - Basal: Increase Lantus to 20 units   Thank you  Martha Soltys BSN, RN,CDE Inpatient Diabetes Coordinator [phone] (team pager)     

## 2015-07-01 NOTE — Progress Notes (Signed)
SUBJECTIVE: She feels that her breathing is better but still not at baseline and still with bilateral LE edema.   BP 140/52 mmHg  Pulse 62  Temp(Src) 97.8 F (36.6 C) (Oral)  Resp 18  Ht 5\' 8"  (1.727 m)  Wt 223 lb 1.7 oz (101.2 kg)  BMI 33.93 kg/m2  SpO2 98%  Intake/Output Summary (Last 24 hours) at 07/01/15 1102 Last data filed at 07/01/15 0557  Gross per 24 hour  Intake    120 ml  Output   3900 ml  Net  -3780 ml    PHYSICAL EXAM General: Well developed, well nourished, in no acute distress. Alert and oriented x 3.  Psych:  Good affect, responds appropriately Neck: No JVD. No masses noted.  Lungs: Clear bilaterally with no wheezes or rhonci noted.  Heart: RRR with no murmurs noted. Abdomen: Bowel sounds are present. Soft, non-tender.  Extremities: No lower extremity edema.   LABS: Basic Metabolic Panel:  Recent Labs  01/19/34 0225 07/01/15 0218  NA 140 139  K 3.3* 3.7  CL 91* 91*  CO2 38* 38*  GLUCOSE 278* 334*  BUN 18 14  CREATININE 1.16* 1.11*  CALCIUM 8.4* 9.0  MG 1.9  --   PHOS 3.0  --    CBC:  Recent Labs  06/28/15 1652 06/30/15 0225  WBC 10.4 6.8  NEUTROABS  --  4.7  HGB 11.8* 10.3*  HCT 38.1 34.3*  MCV 91.6 92.5  PLT 270 246   Cardiac Enzymes:  Recent Labs  06/29/15 0047 06/29/15 0636 06/29/15 1214  TROPONINI 0.03 0.03 0.03   Fasting Lipid Panel:  Recent Labs  06/30/15 0225  CHOL 116  HDL 30*  LDLCALC 27  TRIG 670*  CHOLHDL 3.9    Current Meds: . ALPRAZolam  0.5 mg Oral TID  . arformoterol  15 mcg Nebulization BID  . aspirin EC  81 mg Oral BID  . atorvastatin  10 mg Oral QHS  . [START ON 07/02/2015] digoxin  0.125 mg Oral Daily  . diltiazem  300 mg Oral Daily  . enoxaparin (LOVENOX) injection  40 mg Subcutaneous Q24H  . famotidine  20 mg Oral BID  . fluticasone  2 spray Each Nare BID  . furosemide  40 mg Intravenous BID  . insulin aspart  0-15 Units Subcutaneous TID WC  . insulin glargine  10 Units  Subcutaneous QHS  . methocarbamol  750 mg Oral TID  . metoprolol succinate  25 mg Oral Daily  . rOPINIRole  1 mg Oral QHS  . sertraline  100 mg Oral Daily  . sodium chloride flush  3 mL Intravenous Q12H  . traZODone  300 mg Oral QHS  . umeclidinium bromide  1 puff Inhalation Daily   Echo 06/30/15: Left ventricle: The cavity size was normal. There was mild  concentric hypertrophy. Systolic function was normal. The  estimated ejection fraction was in the range of 50% to 55%.  Diffuse hypokinesis worse in the apex, anteolateral and  inferolateral walls with septal dyskinesis. - Aortic valve: Transvalvular velocity was within the normal range.  There was no stenosis. There was no regurgitation. - Mitral valve: Transvalvular velocity was within the normal range.  There was no evidence for stenosis. There was no regurgitation. - Left atrium: The atrium was moderately dilated. - Right ventricle: The cavity size was normal. Wall thickness was  normal. Systolic function was normal. - Tricuspid valve: There was no regurgitation. - Pulmonary arteries: Systolic pressure was within  the normal  range. PA peak pressure: 27 mm Hg (S). - Inferior vena cava: The vessel was dilated. The respirophasic  diameter changes were in the normal range (= 50%), consistent  with elevated central venous pressure. Impressions: - Compared with prior echo 01/01/15, systolic function has  improved.  ASSESSMENT AND PLAN:  1. Acute on chronic combined systolic and diastolic CHF: She has known systolic and diastolic CHF with normal coronary arteries. LVEF normal by echo 06/30/15. She has been diuresing on IV lasix but is still volume overloaded. She is negative 3.8 liters over the last 24 hours. Agree with continued diuresis with IV Lasix x one more day. I/O from first 24 hours of hospital stay are inaccurate as she was flushing all urine without measuring.    2. Atrial fibrillation, paroxysmal: She is  followed in the EP clinic by Dr. Ladona Ridgel. She is rate controlled with digoxin, Cardizem and metoprolol. She has close follow up in device clinic and has had no significant atrial fibrillation burden so she has not been on anti-coagulation.   3. Non-ischemic cardiomyopathy: LVEF normal by echo 06/29/25. She is on a beta blocker.    4. Complete heart block: s/p permanent pacemaker. Recent interrogation and working well.    [name]  4/28/20179:18 AM

## 2015-07-02 DIAGNOSIS — F411 Generalized anxiety disorder: Secondary | ICD-10-CM

## 2015-07-02 DIAGNOSIS — J418 Mixed simple and mucopurulent chronic bronchitis: Secondary | ICD-10-CM

## 2015-07-02 LAB — GLUCOSE, CAPILLARY
GLUCOSE-CAPILLARY: 353 mg/dL — AB (ref 65–99)
GLUCOSE-CAPILLARY: 288 mg/dL — AB (ref 65–99)
GLUCOSE-CAPILLARY: 358 mg/dL — AB (ref 65–99)
Glucose-Capillary: 254 mg/dL — ABNORMAL HIGH (ref 65–99)
Glucose-Capillary: 313 mg/dL — ABNORMAL HIGH (ref 65–99)

## 2015-07-02 LAB — CBC
HEMATOCRIT: 34.5 % — AB (ref 36.0–46.0)
HEMOGLOBIN: 10.5 g/dL — AB (ref 12.0–15.0)
MCH: 28.6 pg (ref 26.0–34.0)
MCHC: 30.4 g/dL (ref 30.0–36.0)
MCV: 94 fL (ref 78.0–100.0)
Platelets: 220 10*3/uL (ref 150–400)
RBC: 3.67 MIL/uL — ABNORMAL LOW (ref 3.87–5.11)
RDW: 13.8 % (ref 11.5–15.5)
WBC: 6.9 10*3/uL (ref 4.0–10.5)

## 2015-07-02 LAB — COMPREHENSIVE METABOLIC PANEL
ALBUMIN: 3.2 g/dL — AB (ref 3.5–5.0)
ALK PHOS: 48 U/L (ref 38–126)
ALT: 37 U/L (ref 14–54)
ANION GAP: 11 (ref 5–15)
AST: 34 U/L (ref 15–41)
BILIRUBIN TOTAL: 0.3 mg/dL (ref 0.3–1.2)
BUN: 10 mg/dL (ref 6–20)
CO2: 38 mmol/L — ABNORMAL HIGH (ref 22–32)
CREATININE: 1.06 mg/dL — AB (ref 0.44–1.00)
Calcium: 9 mg/dL (ref 8.9–10.3)
Chloride: 91 mmol/L — ABNORMAL LOW (ref 101–111)
GFR calc Af Amer: >60 mL/min (ref >60)
GFR, EST NON AFRICAN AMERICAN: 56 mL/min — AB (ref >60)
Glucose, Bld: 340 mg/dL — ABNORMAL HIGH (ref 65–99)
POTASSIUM: 3.6 mmol/L (ref 3.5–5.1)
Sodium: 140 mmol/L (ref 135–145)
TOTAL PROTEIN: 5.7 g/dL — AB (ref 6.5–8.1)

## 2015-07-02 MED ORDER — INSULIN ASPART 100 UNIT/ML ~~LOC~~ SOLN
0.0000 [IU] | Freq: Three times a day (TID) | SUBCUTANEOUS | Status: DC
  Administered 2015-07-03: 8 [IU] via SUBCUTANEOUS
  Administered 2015-07-03: 15 [IU] via SUBCUTANEOUS

## 2015-07-02 MED ORDER — FUROSEMIDE 20 MG PO TABS
60.0000 mg | ORAL_TABLET | Freq: Two times a day (BID) | ORAL | Status: DC
  Administered 2015-07-02 – 2015-07-03 (×3): 60 mg via ORAL
  Filled 2015-07-02 (×3): qty 1

## 2015-07-02 MED ORDER — INSULIN ASPART 100 UNIT/ML ~~LOC~~ SOLN
0.0000 [IU] | Freq: Every day | SUBCUTANEOUS | Status: DC
  Administered 2015-07-02: 5 [IU] via SUBCUTANEOUS

## 2015-07-02 NOTE — Progress Notes (Signed)
[facility] PROGRESS NOTE  [name] ZOX:[id number] DOB: [birth year] DOA: 06/28/2015   PCP: [name], NP PCP/Outpatient Specialists: Patient Care Team: [name], NP as PCP - General (Nurse Practitioner) Marinus Maw, MD as Consulting Physician (Cardiology)     Assessment/Plan: Principal Problem:   Acute on chronic combined systolic and diastolic CHF (congestive heart failure) (HCC) Active Problems:   COPD (chronic obstructive pulmonary disease) (HCC)   Obesity (BMI 30.0-34.9)   GERD (gastroesophageal reflux disease)   Hyperlipidemia   Diabetes mellitus type 2 in obese (HCC)   Chronic pain   Hypertension   Paroxysmal atrial fibrillation (HCC)   Hypercapnic respiratory failure, chronic (HCC)   Pacemaker - [employer], inserted 2014   Anxiety state   RLS (restless legs syndrome)   H/O Clostridium difficile infection   Coronary vasospasm (HCC)   Depression   Elevated serum creatinine   Swelling of lower extremity    HPI: 60 yo female with history of HLD, HTN, COPD, morbid obesity, coronary vasospasm but no CAD, DM, chronic combined CHF, complete heart block s/p PPM, PAF admitted with dyspnea, LE edema and chest pain. Most recent echo October 2016 with LVEF=35-40%. Cardiac cath October 2016 with no evidence of CAD. She reports dyspnea and chest pressure for several weeks along with increased LE edema and 11 lb weight gain. She had an URI and was given steroids and that is when she noticed the above symptoms. Troponin negative x 2. EKG with atrial pacing, RBBB. No ischemic changes. She is on IV Lasix and is having increased urination  Endorses orthopnea for 2 weeks   Assessment and plan Acute on chronic combined systolic and diastolic CHF:  - EF of 50-55%, compared to last echo systolic function has improved. She has known systolic and diastolic CHF -negative 4.5L, improving -per Cards, change to PO lasix today -needs close CHF team FU after DC    Atrial fibrillation, paroxysmal: She is followed in the EP clinic by Dr. Ladona Ridgel. She is rate controlled with digoxin, Cardizem and metoprolol. She has close follow up in device clinic and has had no significant atrial fibrillation burden so she has not been on anti-coagulation. Currently on aspirin 81 mg a day.   Non-ischemic cardiomyopathy: She is on a beta blocker but she is no longer on the Ace-inh. She was on Lisinopril at time of d/c from [facility] in October 2016. This was stopped sometime between December and February. EchResults as above.  currently on digoxin 0.125 mg a day, Cardizem 300 mg a day, Toprol-XL 25 mg a day,    Complete heart block: s/p permanent pacemaker. Recent interrogation and working well.    Diabetes mellitus insulin-dependent, continue Lantus and sliding scale insulin, hemoglobin A1c 6.4, hold metformin   Depression/anxiety -Continue her xanax, zoloft and trazodone; increased her zoloft to 100 mg 4/26.  Diarrhea-C. difficile negative -improved  - COPD without exacerbation  appears stable, continue maintenance inhalers/neb.  -chronic resp failure on 4L home O2    DVT prophylaxsis  Lovenox   Code Status:  Full code   Family Communication: none at bedside  Disposition Plan:  Home possibly tomorrow    consultations-cardiology     Procedures None  Antibiotics: Anti-infectives    None         HPI/Subjective: Breathing better, ankle still swollen  Objective: Filed Vitals:   07/01/15 2015 07/02/15 0500 07/02/15 0525 07/02/15 0703  BP: 135/67  128/55   Pulse: 58  59 70  Temp: 97.7 F (36.5 C)  97.8 F (36.6 C)   TempSrc: Oral  Oral   Resp: 18  16 18   Height:      Weight:  100.381 kg (221 lb 4.8 oz)    SpO2: 97%  95% 95%    Intake/Output Summary (Last 24 hours) at 07/02/15 0959 Last data filed at 07/02/15 0900  Gross per 24 hour  Intake    720 ml  Output   1800 ml  Net  -1080 ml    Exam:  Examination:  General exam: Appears calm  and comfortable, AAOx3, no distress Respiratory system: Clear to auscultation. Respiratory effort normal. Cardiovascular system: S1 & S2 heard, RRR. No JVD, murmurs, rubs, gallops or clicks. Gastrointestinal system: Abdomen is nondistended, soft and nontender. No organomegaly or masses felt. Normal bowel sounds heard. Central nervous system: Alert and oriented. No focal neurological deficits. Extremities: 1+ankle edema. Skin: No rashes, lesions or ulcers Psychiatry: Judgement and insight appear normal. Mood & affect appropriate.     Data Reviewed: I have personally reviewed following labs and imaging studies  Micro Results Recent Results (from the past 240 hour(s))  C difficile quick scan w PCR reflex     Status: None   Collection Time: 06/29/15  2:41 PM  Result Value Ref Range Status   C Diff antigen NEGATIVE NEGATIVE Final   C Diff toxin NEGATIVE NEGATIVE Final   C Diff interpretation Negative for toxigenic C. difficile  Final    Radiology Reports Dg Chest 2 View  06/28/2015  CLINICAL DATA:  Shortness of Breath EXAM: CHEST  2 VIEW COMPARISON:  04/25/2015 FINDINGS: Cardiac shadow is enlarged. Pacing device is again seen and stable. The lungs are clear bilaterally. No acute bony abnormality is seen. IMPRESSION: No active cardiopulmonary disease. Electronically Signed   By: Alcide Clever M.D.   On: 06/28/2015 17:31   US Renal  06/30/2015  CLINICAL DATA:  Renal failure. EXAM: RENAL / URINARY TRACT ULTRASOUND COMPLETE COMPARISON:  None. FINDINGS: Right Kidney: Length: 13 cm. Echogenicity within normal limits. No mass or hydronephrosis visualized. Mild cortical thinning. Left Kidney: Length: 11.6 cm. Echogenicity within normal limits. No mass or hydronephrosis visualized. Mild cortical thinning. Bladder: Appears normal for degree of bladder distention. IMPRESSION: No evidence of hydronephrosis. Both kidneys demonstrate mild cortical atrophy. Electronically Signed   By: Irish Lack M.D.    On: 06/30/2015 13:37     CBC  Recent Labs Lab 06/28/15 1652 06/30/15 0225 07/02/15 0252  WBC 10.4 6.8 6.9  HGB 11.8* 10.3* 10.5*  HCT 38.1 34.3* 34.5*  PLT 270 246 220  MCV 91.6 92.5 94.0  MCH 28.4 27.8 28.6  MCHC 31.0 30.0 30.4  RDW 13.9 13.9 13.8  LYMPHSABS  --  1.4  --   MONOABS  --  0.5  --   EOSABS  --  0.1  --   BASOSABS  --  0.0  --     Chemistries   Recent Labs Lab 06/28/15 1652 06/29/15 0228 06/30/15 0225 07/01/15 0218 07/02/15 0252  NA 141 139 140 139 140  K 3.7 3.7 3.3* 3.7 3.6  CL 89* 87* 91* 91* 91*  CO2 37* 37* 38* 38* 38*  GLUCOSE 212* 263* 278* 334* 340*  BUN 19 18 18 14 10   CREATININE 1.19* 1.14* 1.16* 1.11* 1.06*  CALCIUM 8.6* 8.3* 8.4* 9.0 9.0  MG  --   --  1.9  --   --   AST  --   --  27  --  34  ALT  --   --  26  --  37  ALKPHOS  --   --  43  --  48  BILITOT  --   --  0.3  --  0.3   ------------------------------------------------------------------------------------------------------------------ estimated creatinine clearance is 69.9 mL/min (by C-G formula based on Cr of 1.06). ------------------------------------------------------------------------------------------------------------------ No results for input(s): HGBA1C in the last 72 hours. ------------------------------------------------------------------------------------------------------------------  Recent Labs  06/30/15 0225  CHOL 116  HDL 30*  LDLCALC 27  TRIG 409*  CHOLHDL 3.9   ------------------------------------------------------------------------------------------------------------------ No results for input(s): TSH, T4TOTAL, T3FREE, THYROIDAB in the last 72 hours.  Invalid input(s): FREET3 ------------------------------------------------------------------------------------------------------------------ No results for input(s): VITAMINB12, FOLATE, FERRITIN, TIBC, IRON, RETICCTPCT in the last 72 hours.  Coagulation profile No results for input(s): INR, PROTIME in  the last 168 hours.  No results for input(s): DDIMER in the last 72 hours.  Cardiac Enzymes  Recent Labs Lab 06/29/15 0047 06/29/15 0636 06/29/15 1214  TROPONINI 0.03 0.03 0.03   ------------------------------------------------------------------------------------------------------------------ Invalid input(s): POCBNP   CBG:  Recent Labs Lab 07/01/15 0554 07/01/15 1116 07/01/15 1645 07/01/15 2304 07/02/15 0614  GLUCAP 278* 306* 269* 268* 254*       Studies: US Renal  06/30/2015  CLINICAL DATA:  Renal failure. EXAM: RENAL / URINARY TRACT ULTRASOUND COMPLETE COMPARISON:  None. FINDINGS: Right Kidney: Length: 13 cm. Echogenicity within normal limits. No mass or hydronephrosis visualized. Mild cortical thinning. Left Kidney: Length: 11.6 cm. Echogenicity within normal limits. No mass or hydronephrosis visualized. Mild cortical thinning. Bladder: Appears normal for degree of bladder distention. IMPRESSION: No evidence of hydronephrosis. Both kidneys demonstrate mild cortical atrophy. Electronically Signed   By: Irish Lack M.D.   On: 06/30/2015 13:37      Lab Results  Component Value Date   HGBA1C 6.4* 06/29/2015   HGBA1C 7.8* 12/31/2014   HGBA1C 6.9* 08/23/2014   Lab Results  Component Value Date   LDLCALC 27 06/30/2015   CREATININE 1.06* 07/02/2015       Scheduled Meds: . ALPRAZolam  0.5 mg Oral TID  . arformoterol  15 mcg Nebulization BID  . aspirin EC  81 mg Oral BID  . atorvastatin  10 mg Oral QHS  . digoxin  0.125 mg Oral Daily  . diltiazem  300 mg Oral Daily  . enoxaparin (LOVENOX) injection  40 mg Subcutaneous Q24H  . famotidine  20 mg Oral BID  . fluticasone  2 spray Each Nare BID  . furosemide  60 mg Oral BID  . insulin aspart  0-15 Units Subcutaneous TID WC  . insulin glargine  10 Units Subcutaneous QHS  . methocarbamol  750 mg Oral TID  . metoprolol succinate  25 mg Oral Daily  . rOPINIRole  1 mg Oral QHS  . sertraline  100 mg Oral Daily   . sodium chloride flush  3 mL Intravenous Q12H  . traZODone  300 mg Oral QHS  . umeclidinium bromide  1 puff Inhalation Daily   Continuous Infusions:    LOS: 2 days    Time spent: >30 MINS    Zeanna Sunde  [facility] Pager [phone] If 7PM-7AM, please contact night-coverage at [email or web address], password [facility] 07/02/2015, 9:59 AM  LOS: 2 days

## 2015-07-02 NOTE — Progress Notes (Signed)
SUBJECTIVE: Diuresed another 1L overnight.   BP 128/55 mmHg  Pulse 70  Temp(Src) 97.8 F (36.6 C) (Oral)  Resp 18  Ht  (1.727 m)  Wt 221 lb 4.8 oz (100.381 kg)  BMI 33.66 kg/m2  SpO2 95%  Intake/Output Summary (Last 24 hours) at 07/02/15 0947 Last data filed at 07/02/15 0900  Gross per 24 hour  Intake    720 ml  Output   1800 ml  Net  -1080 ml    PHYSICAL EXAM General: Well developed, well nourished, in no acute distress. Alert and oriented x 3.  Psych:  Good affect, responds appropriately Neck: No JVD. No masses noted.  Lungs: Clear bilaterally with no wheezes or rhonci noted.  Heart: RRR with no murmurs noted. Abdomen: Bowel sounds are present. Soft, non-tender.  Extremities: No lower extremity edema.   LABS: Basic Metabolic Panel:  Recent Labs  16/10/96 0225 07/01/15 0218 07/02/15 0252  NA 140 139 140  K 3.3* 3.7 3.6  CL 91* 91* 91*  CO2 38* 38* 38*  GLUCOSE 278* 334* 340*  BUN CREATININE 1.16* 1.11* 1.06*  CALCIUM 8.4* 9.0 9.0  MG 1.9  --   --   PHOS 3.0  --   --    CBC:  Recent Labs  06/30/15 0225 07/02/15 0252  WBC 6.8 6.9  NEUTROABS 4.7  --   HGB 10.3* 10.5*  HCT 34.3* 34.5*  MCV 92.5 94.0  PLT 246 220   Cardiac Enzymes:  Recent Labs  06/29/15 1214  TROPONINI 0.03   Fasting Lipid Panel:  Recent Labs  06/30/15 0225  CHOL 116  HDL 30*  LDLCALC 27  TRIG 045*  CHOLHDL 3.9    Current Meds: . ALPRAZolam  0.5 mg Oral TID  . arformoterol  15 mcg Nebulization BID  . aspirin EC  81 mg Oral BID  . atorvastatin  10 mg Oral QHS  . digoxin  0.125 mg Oral Daily  . diltiazem  300 mg Oral Daily  . enoxaparin (LOVENOX) injection  40 mg Subcutaneous Q24H  . famotidine  20 mg Oral BID  . fluticasone  2 spray Each Nare BID  . furosemide  40 mg Intravenous BID  . insulin aspart  0-15 Units Subcutaneous TID WC  . insulin glargine  10 Units Subcutaneous QHS  . methocarbamol  750 mg Oral TID  . metoprolol succinate  25  mg Oral Daily  . rOPINIRole  1 mg Oral QHS  . sertraline  100 mg Oral Daily  . sodium chloride flush  3 mL Intravenous Q12H  . traZODone  300 mg Oral QHS  . umeclidinium bromide  1 puff Inhalation Daily   Echo 06/30/15: Left ventricle: The cavity size was normal. There was mild  concentric hypertrophy. Systolic function was normal. The  estimated ejection fraction was in the range of 50% to 55%.  Diffuse hypokinesis worse in the apex, anteolateral and  inferolateral walls with septal dyskinesis. - Aortic valve: Transvalvular velocity was within the normal range.  There was no stenosis. There was no regurgitation. - Mitral valve: Transvalvular velocity was within the normal range.  There was no evidence for stenosis. There was no regurgitation. - Left atrium: The atrium was moderately dilated. - Right ventricle: The cavity size was normal. Wall thickness was  normal. Systolic function was normal. - Tricuspid valve: There was no regurgitation. - Pulmonary arteries: Systolic pressure was within the normal  range. PA peak pressure:  27 mm Hg (S). - Inferior vena cava: The vessel was dilated. The respirophasic  diameter changes were in the normal range (= 50%), consistent  with elevated central venous pressure. Impressions: - Compared with prior echo 01/01/15, systolic function has  improved.  ASSESSMENT AND PLAN:  1. Acute on chronic combined systolic and diastolic CHF: Now over 5L negative since admission. Breathing has improved. Switch to po lasix 60 mg BID.  2. Atrial fibrillation, paroxysmal: She is followed in the EP clinic by Dr. Ladona Ridgel. She is rate controlled with digoxin, Cardizem and metoprolol. She has close follow up in device clinic and has had no significant atrial fibrillation burden so she has not been on anti-coagulation.   3. Non-ischemic cardiomyopathy: LVEF normal by echo 06/29/25. She is on a beta blocker.    4. Complete heart block: s/p permanent  pacemaker. Recent interrogation and working well.    5. Leg edema - s/s of chronic venous insufficiency. Place TED hose, elevate feet.   Chrystie Nose, MD, [facility] Attending Cardiologist [facility]  Chrystie Nose  4/29/20179:47 AM

## 2015-07-03 DIAGNOSIS — J438 Other emphysema: Secondary | ICD-10-CM

## 2015-07-03 DIAGNOSIS — J42 Unspecified chronic bronchitis: Secondary | ICD-10-CM

## 2015-07-03 LAB — CBC
HEMATOCRIT: 34.7 % — AB (ref 36.0–46.0)
HEMOGLOBIN: 10.6 g/dL — AB (ref 12.0–15.0)
MCH: 28.4 pg (ref 26.0–34.0)
MCHC: 30.5 g/dL (ref 30.0–36.0)
MCV: 93 fL (ref 78.0–100.0)
Platelets: 233 10*3/uL (ref 150–400)
RBC: 3.73 MIL/uL — AB (ref 3.87–5.11)
RDW: 13.7 % (ref 11.5–15.5)
WBC: 7.5 10*3/uL (ref 4.0–10.5)

## 2015-07-03 LAB — BASIC METABOLIC PANEL
Anion gap: 11 (ref 5–15)
BUN: 12 mg/dL (ref 6–20)
CHLORIDE: 91 mmol/L — AB (ref 101–111)
CO2: 37 mmol/L — AB (ref 22–32)
Calcium: 8.6 mg/dL — ABNORMAL LOW (ref 8.9–10.3)
Creatinine, Ser: 1.04 mg/dL — ABNORMAL HIGH (ref 0.44–1.00)
GFR calc non Af Amer: 57 mL/min — ABNORMAL LOW (ref >60)
Glucose, Bld: 312 mg/dL — ABNORMAL HIGH (ref 65–99)
POTASSIUM: 3.6 mmol/L (ref 3.5–5.1)
Sodium: 139 mmol/L (ref 135–145)

## 2015-07-03 LAB — GLUCOSE, CAPILLARY
Glucose-Capillary: 300 mg/dL — ABNORMAL HIGH (ref 65–99)
Glucose-Capillary: 369 mg/dL — ABNORMAL HIGH (ref 65–99)

## 2015-07-03 MED ORDER — DIGOXIN 125 MCG PO TABS: 0.1250 mg | ORAL_TABLET | Freq: Every day | ORAL | Status: AC

## 2015-07-03 MED ORDER — FUROSEMIDE 20 MG PO TABS: 60.0000 mg | ORAL_TABLET | Freq: Two times a day (BID) | ORAL | Status: DC

## 2015-07-03 MED ORDER — ARFORMOTEROL TARTRATE 15 MCG/2ML IN NEBU: 15.0000 ug | INHALATION_SOLUTION | Freq: Two times a day (BID) | RESPIRATORY_TRACT | Status: AC

## 2015-07-03 MED ORDER — FUROSEMIDE 20 MG PO TABS: 60.0000 mg | ORAL_TABLET | Freq: Two times a day (BID) | ORAL | Status: AC

## 2015-07-03 MED ORDER — INSULIN GLARGINE 100 UNIT/ML ~~LOC~~ SOLN: 25.0000 [IU] | Freq: Every day | SUBCUTANEOUS | Status: DC

## 2015-07-03 NOTE — Progress Notes (Addendum)
Pt has been discharged home. IV was removed with no complications. Pt received discharge instructions and all questions were answered. Pt was instructed to pick up prescriptions at her pharmacy. Pt was discharged with all of her belongings. Pt left the floor via wheelchair and was accompanied by this RN and a Psychologist, sport and exercise. Pt was discharged with her portable oxygen system and oxygen was set to 4 liters. Pt was in no distress at time of discharge.  Berdine Dance RN, BSN

## 2015-07-03 NOTE — Discharge Summary (Addendum)
Physician Discharge Summary  [name] MRN: [id number] DOB/AGE: 61-Nov-1957 61 y.o.  PCP: Delia Chimes, NP   Admit date: 06/28/2015 Discharge date: 07/03/2015  Discharge Diagnoses:     Principal Problem:   Acute on chronic combined systolic and diastolic CHF (congestive heart failure) (HCC) Active Problems:   COPD (chronic obstructive pulmonary disease) (HCC)   Obesity (BMI 30.0-34.9)   GERD (gastroesophageal reflux disease)   Hyperlipidemia   Diabetes mellitus type 2 in obese (HCC)   Chronic pain   Hypertension   Paroxysmal atrial fibrillation (HCC)   Hypercapnic respiratory failure, chronic ([place])   Pacemaker - Med[employer], inserted 2014   Anxiety state   RLS (restless legs syndrome)   H/O Clostridium difficile infection   Coronary vasospasm (HCC)   Depression   Elevated serum creatinine   Swelling of lower extremity    Follow-up recommendations Follow-up with PCP in 3-5 days , including all  additional recommended appointments as below Follow-up CBC, CMP in 3-5 days  Current Discharge Medication List    START taking these medications   Details  arformoterol (BROVANA) 15 MCG/2ML NEBU Take 2 mLs (15 mcg total) by nebulization 2 (two) times daily. Qty: 120 mL, Refills: 1    insulin glargine (LANTUS) 100 UNIT/ML injection Inject 0.25 mLs (25 Units total) into the skin at bedtime. **DO NOT MIX WITH OTHER INSULINS** Qty: 10 mL, Refills: 11      CONTINUE these medications which have CHANGED   Details  digoxin (LANOXIN) 0.125 MG tablet Take 1 tablet (0.125 mg total) by mouth daily. Qty: 30 tablet, Refills: 1    furosemide (LASIX) 20 MG tablet Take 3 tablets (60 mg total) by mouth 2 (two) times daily. Qty: 120 tablet, Refills: 2      CONTINUE these medications which have NOT CHANGED   Details  albuterol (PROVENTIL HFA;VENTOLIN HFA) 108 (90 BASE) MCG/ACT inhaler Inhale 2 puffs into the lungs every 6 (six) hours as needed for wheezing or shortness of breath.     albuterol (PROVENTIL) (2.5 MG/3ML) 0.083% nebulizer solution Take 2.5 mg by nebulization every 6 (six) hours.    ALPRAZolam (XANAX) 0.5 MG tablet Take 1 tablet by mouth 3 (three) times daily.    aspirin EC 81 MG tablet Take 81 mg by mouth 2 (two) times daily.    atorvastatin (LIPITOR) 10 MG tablet Take 10 mg by mouth at bedtime.    CARTIA XT 300 MG 24 hr capsule Take 1 tablet by mouth daily.    famotidine (PEPCID) 20 MG tablet Take 1 tablet (20 mg total) by mouth at bedtime. Qty: 30 tablet, Refills: 1    fluticasone (FLONASE) 50 MCG/ACT nasal spray Place 2 sprays into both nostrils 2 (two) times daily.     Glycopyrrolate-Formoterol (BEVESPI AEROSPHERE) 9-4.8 MCG/ACT AERO Inhale 2 puffs into the lungs daily.    HYDROcodone-acetaminophen (NORCO) 10-325 MG per tablet Take 1 tablet by mouth 3 (three) times daily as needed for moderate pain.     insulin aspart (NOVOLOG) 100 UNIT/ML injection Inject 3-8 Units into the skin 3 (three) times daily as needed for high blood sugar. 150-200=3 units      anything >300=8 units    methocarbamol (ROBAXIN) 750 MG tablet Take 750 mg by mouth 3 (three) times daily.     metoprolol succinate (TOPROL-XL) 25 MG 24 hr tablet TAKE 1 TABLET EVERY DAY Qty: 90 tablet, Refills: 6    OXYGEN Inhale 4 L into the lungs continuous.    rOPINIRole (REQUIP) 1 MG  tablet Take 1 mg by mouth at bedtime.    sertraline (ZOLOFT) 50 MG tablet Take 50 mg by mouth daily.    traZODone (DESYREL) 50 MG tablet Take 300 mg by mouth at bedtime.       STOP taking these medications     metFORMIN (GLUCOPHAGE) 1000 MG tablet      predniSONE (DELTASONE) 50 MG tablet               Discharge Condition: stable   Discharge Instructions Get Medicines reviewed and adjusted: Please take all your medications with you for your next visit with your Primary MD  Please request your Primary MD to go over all hospital tests and procedure/radiological results at the follow up,  please ask your Primary MD to get all Hospital records sent to his/her office.  If you experience worsening of your admission symptoms, develop shortness of breath, life threatening emergency, suicidal or homicidal thoughts you must seek medical attention immediately by calling 911 or calling your MD immediately if symptoms less severe.  You must read complete instructions/literature along with all the possible adverse reactions/side effects for all the Medicines you take and that have been prescribed to you. Take any new Medicines after you have completely understood and accpet all the possible adverse reactions/side effects.   Do not drive when taking Pain medications.   Do not take more than prescribed Pain, Sleep and Anxiety Medications  Special Instructions: If you have smoked or chewed Tobacco in the last 2 yrs please stop smoking, stop any regular Alcohol and or any Recreational drug use.  Wear Seat belts while driving.  Please note  You were cared for by a hospitalist during your hospital stay. Once you are discharged, your primary care physician will handle any further medical issues. Please note that NO REFILLS for any discharge medications will be authorized once you are discharged, as it is imperative that you return to your primary care physician (or establish a relationship with a primary care physician if you do not have one) for your aftercare needs so that they can reassess your need for medications and monitor your lab values.  Discharge Instructions    Diet - low sodium heart healthy    Complete by:  As directed      Increase activity slowly    Complete by:  As directed             Allergies  Allergen Reactions  . Ambien [Zolpidem Tartrate] Other (See Comments)    Severe hallucinations and behavior changes reported by family  . Ciprofloxacin     PATIENT AT VERY HIGH RISK FOR C DIFFICILE COLITIS  . Actos [Pioglitazone] Swelling  . Bydureon [Exenatide]      Stomach pain  . Lunesta [Eszopiclone] Other (See Comments)    Nightmares & hallucinations  . Morphine And Related Itching  . Protonix [Pantoprazole Sodium] Other (See Comments)    Unknown reaction-"Doctor told me to never take it"      Disposition: 01-Home or Self Care   Consults:  cardiology    Significant Diagnostic Studies:  Dg Chest 2 View  06/28/2015  CLINICAL DATA:  Shortness of Breath EXAM: CHEST  2 VIEW COMPARISON:  04/25/2015 FINDINGS: Cardiac shadow is enlarged. Pacing device is again seen and stable. The lungs are clear bilaterally. No acute bony abnormality is seen. IMPRESSION: No active cardiopulmonary disease. Electronically Signed   By: Inez Catalina M.D.   On: 06/28/2015 17:31   US Renal  06/30/2015  CLINICAL DATA:  Renal failure. EXAM: RENAL / URINARY TRACT ULTRASOUND COMPLETE COMPARISON:  None. FINDINGS: Right Kidney: Length: 13 cm. Echogenicity within normal limits. No mass or hydronephrosis visualized. Mild cortical thinning. Left Kidney: Length: 11.6 cm. Echogenicity within normal limits. No mass or hydronephrosis visualized. Mild cortical thinning. Bladder: Appears normal for degree of bladder distention. IMPRESSION: No evidence of hydronephrosis. Both kidneys demonstrate mild cortical atrophy. Electronically Signed   By: Aletta Edouard M.D.   On: 06/30/2015 13:37    Echo LV EF: 50% - 55%  ------------------------------------------------------------------- Indications: CHF - 428.0.  ------------------------------------------------------------------- History: PMH: Ischemic cardiomyopathy. Chronic obstructive pulmonary disease. Risk factors: Hypertension. Diabetes mellitus. Dyslipidemia.  ------------------------------------------------------------------- Study Conclusions  - Left ventricle: The cavity size was normal. There was mild  concentric hypertrophy. Systolic function was normal. The  estimated ejection fraction was in the  range of 50% to 55%.  Diffuse hypokinesis worse in the apex, anteolateral and  inferolateral walls with septal dyskinesis. - Aortic valve: Transvalvular velocity was within the normal range.  There was no stenosis. There was no regurgitation. - Mitral valve: Transvalvular velocity was within the normal range.  There was no evidence for stenosis. There was no regurgitation. - Left atrium: The atrium was moderately dilated. - Right ventricle: The cavity size was normal. Wall thickness was  normal. Systolic function was normal. - Tricuspid valve: There was no regurgitation. - Pulmonary arteries: Systolic pressure was within the normal  range. PA peak pressure: 27 mm Hg (S). - Inferior vena cava: The vessel was dilated. The respirophasic  diameter changes were in the normal range (= 50%), consistent  with elevated central venous pressure.  Impressions:  - Compared with prior echo 48/54/62, systolic function has  improved.   Filed Weights   07/01/15 0537 07/02/15 0500 07/03/15 0425  Weight: 101.2 kg (223 lb 1.7 oz) 100.381 kg (221 lb 4.8 oz) 100.653 kg (221 lb 14.4 oz)     Microbiology: Recent Results (from the past 240 hour(s))  C difficile quick scan w PCR reflex     Status: None   Collection Time: 06/29/15  2:41 PM  Result Value Ref Range Status   C Diff antigen NEGATIVE NEGATIVE Final   C Diff toxin NEGATIVE NEGATIVE Final   C Diff interpretation Negative for toxigenic C. difficile  Final       Blood Culture    Component Value Date/Time   SDES URINE, CATHETERIZED 10/09/2012 2214   SPECREQUEST NONE 10/09/2012 2214   CULT NO GROWTH Performed at [facility] 10/09/2012 2214   REPTSTATUS 10/10/2012 FINAL 10/09/2012 2214      Labs: Results for orders placed or performed during the hospital encounter of 06/28/15 (from the past 48 hour(s))  Glucose, capillary     Status: Abnormal   Collection Time: 07/01/15  4:45 PM  Result Value Ref Range    Glucose-Capillary 269 (H) 65 - 99 mg/dL   Comment 1 Notify RN    Comment 2 Document in Chart   Glucose, capillary     Status: Abnormal   Collection Time: 07/01/15 11:04 PM  Result Value Ref Range   Glucose-Capillary 268 (H) 65 - 99 mg/dL   Comment 1 Notify RN    Comment 2 Document in Chart   Comprehensive metabolic panel     Status: Abnormal   Collection Time: 07/02/15  2:52 AM  Result Value Ref Range   Sodium 140 135 - 145 mmol/L   Potassium 3.6 3.5 - 5.1 mmol/L  Chloride 91 (L) 101 - 111 mmol/L   CO2 38 (H) 22 - 32 mmol/L   Glucose, Bld 340 (H) 65 - 99 mg/dL   BUN 10 6 - 20 mg/dL   Creatinine, Ser 1.06 (H) 0.44 - 1.00 mg/dL   Calcium 9.0 8.9 - 10.3 mg/dL   Total Protein 5.7 (L) 6.5 - 8.1 g/dL   Albumin 3.2 (L) 3.5 - 5.0 g/dL   AST 34 15 - 41 U/L   ALT 37 14 - 54 U/L   Alkaline Phosphatase 48 38 - 126 U/L   Total Bilirubin 0.3 0.3 - 1.2 mg/dL   GFR calc non Af Amer 56 (L) >60 mL/min   GFR calc Af Amer >60 >60 mL/min    Comment: (NOTE) The eGFR has been calculated using the CKD EPI equation. This calculation has not been validated in all clinical situations. eGFR's persistently <60 mL/min signify possible Chronic Kidney Disease.    Anion gap 11 5 - 15  CBC     Status: Abnormal   Collection Time: 07/02/15  2:52 AM  Result Value Ref Range   WBC 6.9 4.0 - 10.5 K/uL   RBC 3.67 (L) 3.87 - 5.11 MIL/uL   Hemoglobin 10.5 (L) 12.0 - 15.0 g/dL   HCT 34.5 (L) 36.0 - 46.0 %   MCV 94.0 78.0 - 100.0 fL   MCH 28.6 26.0 - 34.0 pg   MCHC 30.4 30.0 - 36.0 g/dL   RDW 13.8 11.5 - 15.5 %   Platelets 220 150 - 400 K/uL  Glucose, capillary     Status: Abnormal   Collection Time: 07/02/15  6:14 AM  Result Value Ref Range   Glucose-Capillary 254 (H) 65 - 99 mg/dL  Glucose, capillary     Status: Abnormal   Collection Time: 07/02/15 11:30 AM  Result Value Ref Range   Glucose-Capillary 353 (H) 65 - 99 mg/dL   Comment 1 Notify RN    Comment 2 Document in Chart   Glucose, capillary      Status: Abnormal   Collection Time: 07/02/15  4:39 PM  Result Value Ref Range   Glucose-Capillary 288 (H) 65 - 99 mg/dL   Comment 1 Notify RN    Comment 2 Document in Chart   Glucose, capillary     Status: Abnormal   Collection Time: 07/02/15  8:08 PM  Result Value Ref Range   Glucose-Capillary 313 (H) 65 - 99 mg/dL  Glucose, capillary     Status: Abnormal   Collection Time: 07/02/15 11:17 PM  Result Value Ref Range   Glucose-Capillary 358 (H) 65 - 99 mg/dL  CBC     Status: Abnormal   Collection Time: 07/03/15  2:53 AM  Result Value Ref Range   WBC 7.5 4.0 - 10.5 K/uL   RBC 3.73 (L) 3.87 - 5.11 MIL/uL   Hemoglobin 10.6 (L) 12.0 - 15.0 g/dL   HCT 34.7 (L) 36.0 - 46.0 %   MCV 93.0 78.0 - 100.0 fL   MCH 28.4 26.0 - 34.0 pg   MCHC 30.5 30.0 - 36.0 g/dL   RDW 13.7 11.5 - 15.5 %   Platelets 233 150 - 400 K/uL  Basic metabolic panel     Status: Abnormal   Collection Time: 07/03/15  2:53 AM  Result Value Ref Range   Sodium 139 135 - 145 mmol/L   Potassium 3.6 3.5 - 5.1 mmol/L   Chloride 91 (L) 101 - 111 mmol/L   CO2 37 (H) 22 - 32  mmol/L   Glucose, Bld 312 (H) 65 - 99 mg/dL   BUN 12 6 - 20 mg/dL   Creatinine, Ser 1.04 (H) 0.44 - 1.00 mg/dL   Calcium 8.6 (L) 8.9 - 10.3 mg/dL   GFR calc non Af Amer 57 (L) >60 mL/min   GFR calc Af Amer >60 >60 mL/min    Comment: (NOTE) The eGFR has been calculated using the CKD EPI equation. This calculation has not been validated in all clinical situations. eGFR's persistently <60 mL/min signify possible Chronic Kidney Disease.    Anion gap 11 5 - 15  Glucose, capillary     Status: Abnormal   Collection Time: 07/03/15  6:16 AM  Result Value Ref Range   Glucose-Capillary 300 (H) 65 - 99 mg/dL  Glucose, capillary     Status: Abnormal   Collection Time: 07/03/15 11:18 AM  Result Value Ref Range   Glucose-Capillary 369 (H) 65 - 99 mg/dL   Comment 1 Notify RN    Comment 2 Document in Chart      Lipid Panel     Component Value Date/Time    CHOL 116 06/30/2015 0225   TRIG 297* 06/30/2015 0225   HDL 30* 06/30/2015 0225   CHOLHDL 3.9 06/30/2015 0225   VLDL 59* 06/30/2015 0225   LDLCALC 27 06/30/2015 0225     Lab Results  Component Value Date   HGBA1C 6.4* 06/29/2015   HGBA1C 7.8* 12/31/2014   HGBA1C 6.9* 08/23/2014     Lab Results  Component Value Date   LDLCALC 27 06/30/2015   CREATININE 1.04* 07/03/2015     60 yo female with history of HLD, HTN, COPD, morbid obesity, coronary vasospasm but no CAD, DM, chronic combined CHF, complete heart block s/p PPM, PAF admitted with dyspnea, LE edema and chest pain. Most recent echo October 2016 with LVEF=35-40%. Cardiac cath October 2016 with no evidence of CAD. She reports dyspnea and chest pressure for several weeks along with increased LE edema and 11 lb weight gain. She had an URI and was given steroids and that is when she noticed the above symptoms. Troponin negative x 2. EKG with atrial pacing, RBBB. No ischemic changes. She is on IV Lasix and is having increased urination Endorses orthopnea for 2 weeks   Assessment and plan Acute on chronic combined systolic and diastolic CHF: 2-D echo this admission shows an EF of 50-55%, compared to last echo systolic function has improved. She has known systolic and diastolic CHF with normal coronary arteries. She is volume overloaded. Received  IV Lasix.5300 cc in the last 24 hours. Strict I's and os.. Cr stable patient should be better optimized with ACEi/ARB, however given abnormal renal function would hold off and defer as outpatient  . Switch to po lasix 60 mg BID.   Atrial fibrillation, paroxysmal: She is followed in the EP clinic by Dr. Lovena Le. She is rate controlled with digoxin, Cardizem and metoprolol. She has close follow up in device clinic and has had no significant atrial fibrillation burden so she has not been on anti-coagulation. Currently on aspirin 81 mg a day.  Non-ischemic cardiomyopathy: She is on a beta  blocker but she is no longer on the Ace-inh. She was on Lisinopril at time of d/c from [facility] in October 2016. This was stopped sometime between December and February.    currently on digoxin 0.125 mg a day, Cardizem 300 mg a day, Toprol-XL 25 mg a day,   Complete heart block: s/p permanent pacemaker. Recent  interrogation and working well.   Diabetes mellitus insulin-dependent,  Started on  Lantus 25 units  and sliding scale insulin, hemoglobin A1c 6.4, dc  metformin  anxiety  Continue her xanax, zoloft and trazodone; just increased her zoloft to 100 mg 4/26.   Diarrhea-C. difficile negative  - COPD without exacerbation appears stable, continue maintenance inhalers/neb.       Discharge Exam:  Blood pressure 100/48, pulse 61, temperature 98.2 F (36.8 C), temperature source Oral, resp. rate 18, height _0  (1.727 m), weight 100.653 kg (221 lb 14.4 oz), SpO2 96 %.  General: Well developed, well nourished, in no acute distress. Alert and oriented x 3.  Psych: Good affect, responds appropriately Neck: No JVD. No masses noted.  Lungs: Clear bilaterally with no wheezes or rhonci noted.  Heart: RRR with no murmurs noted. Abdomen: Bowel sounds are present. Soft, non-tender.  Extremities: No lower extremity edema.      Follow-up Information    Schedule an appointment as soon as possible for a visit with Delia Chimes, NP.   Specialty:  Nurse Practitioner   Contact information:   [place] [address] [phone]       Follow up with Cristopher Peru, MD. Schedule an appointment as soon as possible for a visit in 1 month.   Specialty:  Cardiology   Contact information:   [address] [place] [address] [phone]       Signed: Reyne Dumas 07/03/2015, 12:54 PM        Time spent >45 mins

## 2015-07-03 NOTE — Progress Notes (Signed)
SUBJECTIVE: Diuresed well. -9L liters. Home O2.  BP 100/48 mmHg  Pulse 61  Temp(Src) 98.2 F (36.8 C) (Oral)  Resp 18  Ht  (1.727 m)  Wt 221 lb 14.4 oz (100.653 kg)  BMI 33.75 kg/m2  SpO2 96%  Intake/Output Summary (Last 24 hours) at 07/03/15 1339 Last data filed at 07/03/15 1244  Gross per 24 hour  Intake   1080 ml  Output   4150 ml  Net  -3070 ml    PHYSICAL EXAM General: Well developed, well nourished, in no acute distress. Alert and oriented x 3.  Psych:  Good affect, responds appropriately Neck: No JVD. No masses noted.  Lungs: Clear bilaterally with no wheezes or rhonci noted.  Heart: RRR with no murmurs noted. Abdomen: Bowel sounds are present. Soft, non-tender.  Extremities: No lower extremity edema.   LABS: Basic Metabolic Panel:  Recent Labs  40/98/11 0252 07/03/15 0253  NA 140 139  K 3.6 3.6  CL 91* 91*  CO2 38* 37*  GLUCOSE 340* 312*  BUN 10 12  CREATININE 1.06* 1.04*  CALCIUM 9.0 8.6*   CBC:  Recent Labs  07/02/15 0252 07/03/15 0253  WBC 6.9 7.5  HGB 10.5* 10.6*  HCT 34.5* 34.7*  MCV 94.0 93.0  PLT 220 233   Cardiac Enzymes: No results for input(s): CKTOTAL, CKMB, CKMBINDEX, TROPONINI in the last 72 hours. Fasting Lipid Panel: No results for input(s): CHOL, HDL, LDLCALC, TRIG, CHOLHDL, LDLDIRECT in the last 72 hours.  Current Meds: . ALPRAZolam  0.5 mg Oral TID  . arformoterol  15 mcg Nebulization BID  . aspirin EC  81 mg Oral BID  . atorvastatin  10 mg Oral QHS  . digoxin  0.125 mg Oral Daily  . diltiazem  300 mg Oral Daily  . enoxaparin (LOVENOX) injection  40 mg Subcutaneous Q24H  . famotidine  20 mg Oral BID  . fluticasone  2 spray Each Nare BID  . furosemide  60 mg Oral BID  . insulin aspart  0-15 Units Subcutaneous TID WC  . insulin aspart  0-5 Units Subcutaneous QHS  . insulin glargine  10 Units Subcutaneous QHS  . methocarbamol  750 mg Oral TID  . metoprolol succinate  25 mg Oral Daily  . rOPINIRole  1 mg  Oral QHS  . sertraline  100 mg Oral Daily  . sodium chloride flush  3 mL Intravenous Q12H  . traZODone  300 mg Oral QHS  . umeclidinium bromide  1 puff Inhalation Daily   Echo 06/30/15: Left ventricle: The cavity size was normal. There was mild  concentric hypertrophy. Systolic function was normal. The  estimated ejection fraction was in the range of 50% to 55%.  Diffuse hypokinesis worse in the apex, anteolateral and  inferolateral walls with septal dyskinesis. - Aortic valve: Transvalvular velocity was within the normal range.  There was no stenosis. There was no regurgitation. - Mitral valve: Transvalvular velocity was within the normal range.  There was no evidence for stenosis. There was no regurgitation. - Left atrium: The atrium was moderately dilated. - Right ventricle: The cavity size was normal. Wall thickness was  normal. Systolic function was normal. - Tricuspid valve: There was no regurgitation. - Pulmonary arteries: Systolic pressure was within the normal  range. PA peak pressure: 27 mm Hg (S). - Inferior vena cava: The vessel was dilated. The respirophasic  diameter changes were in the normal range (= 50%), consistent  with elevated central venous pressure.  Impressions: - Compared with prior echo 01/01/15, systolic function has  improved.  ASSESSMENT AND PLAN:  1. Acute on chronic combined systolic and diastolic CHF: Now over 9L negative since admission. Breathing has improved. Switched to po lasix 60 mg BID.we discussed the importance of fluid restriction 1.5 L.  2. Atrial fibrillation, paroxysmal: She is followed in the EP clinic by Dr. Ladona Ridgel. She is rate controlled with digoxin, Cardizem and metoprolol. She has close follow up in device clinic and has had no significant atrial fibrillation burden so she has not been on anti-coagulation. In the future, it may now be a bad idea to discontinue digoxin.  3. Non-ischemic cardiomyopathy: LVEF normal by echo  06/29/25. She is on a beta blocker.  BNP is 42.  4. Complete heart block: s/p permanent pacemaker. Recent interrogation and working well.    5. Leg edema - s/s of chronic venous insufficiency. Place TED hose, elevate feet. Okay for discharge.  Charbel Los  4/30/20171:39 PM

## 2015-09-28 ENCOUNTER — Encounter: Payer: Self-pay | Admitting: *Deleted

## 2015-10-18 ENCOUNTER — Encounter: Payer: Medicare HMO | Admitting: Internal Medicine

## 2015-11-24 NOTE — Progress Notes (Signed)
This encounter was created in error - please disregard.

## 2015-12-09 ENCOUNTER — Encounter: Payer: Medicare HMO | Admitting: Internal Medicine

## 2015-12-21 ENCOUNTER — Encounter: Payer: Medicare HMO | Admitting: Internal Medicine

## 2016-02-15 ENCOUNTER — Encounter: Payer: Medicare HMO | Admitting: Internal Medicine

## 2016-03-13 ENCOUNTER — Ambulatory Visit (INDEPENDENT_AMBULATORY_CARE_PROVIDER_SITE_OTHER): Payer: Medicare HMO | Admitting: Internal Medicine

## 2016-03-13 ENCOUNTER — Encounter: Payer: Self-pay | Admitting: Internal Medicine

## 2016-03-13 ENCOUNTER — Encounter (INDEPENDENT_AMBULATORY_CARE_PROVIDER_SITE_OTHER): Payer: Self-pay

## 2016-03-13 VITALS — BP 112/68 | HR 60 | Ht 66.0 in | Wt 204.8 lb

## 2016-03-13 DIAGNOSIS — Z950 Presence of cardiac pacemaker: Secondary | ICD-10-CM | POA: Diagnosis not present

## 2016-03-13 DIAGNOSIS — I4891 Unspecified atrial fibrillation: Secondary | ICD-10-CM

## 2016-03-13 LAB — CUP PACEART INCLINIC DEVICE CHECK
Battery Impedance: 227 Ohm
Battery Remaining Longevity: 116 mo
Battery Voltage: 2.78 V
Brady Statistic AP VP Percent: 39 %
Brady Statistic AS VP Percent: 14 %
Date Time Interrogation Session: 20180109160950
Implantable Lead Implant Date: 20140814
Implantable Lead Implant Date: 20140814
Implantable Lead Location: 753860
Implantable Lead Model: 5076
Implantable Pulse Generator Implant Date: 20140814
Lead Channel Impedance Value: 536 Ohm
Lead Channel Pacing Threshold Amplitude: 0.625 V
Lead Channel Pacing Threshold Pulse Width: 0.4 ms
Lead Channel Pacing Threshold Pulse Width: 0.4 ms
Lead Channel Sensing Intrinsic Amplitude: 5.6 mV
Lead Channel Setting Pacing Amplitude: 2 V
Lead Channel Setting Sensing Sensitivity: 4 mV
MDC IDC LEAD LOCATION: 753859
MDC IDC MSMT LEADCHNL RA IMPEDANCE VALUE: 497 Ohm
MDC IDC MSMT LEADCHNL RA PACING THRESHOLD AMPLITUDE: 0.75 V
MDC IDC MSMT LEADCHNL RA SENSING INTR AMPL: 4 mV
MDC IDC MSMT LEADCHNL RV PACING THRESHOLD AMPLITUDE: 1 V
MDC IDC MSMT LEADCHNL RV PACING THRESHOLD AMPLITUDE: 1.375 V
MDC IDC MSMT LEADCHNL RV PACING THRESHOLD PULSEWIDTH: 0.4 ms
MDC IDC MSMT LEADCHNL RV PACING THRESHOLD PULSEWIDTH: 0.4 ms
MDC IDC SET LEADCHNL RV PACING AMPLITUDE: 2.5 V
MDC IDC SET LEADCHNL RV PACING PULSEWIDTH: 0.4 ms
MDC IDC STAT BRADY AP VS PERCENT: 5 %
MDC IDC STAT BRADY AS VS PERCENT: 42 %

## 2016-03-13 NOTE — Patient Instructions (Addendum)
Medication Instructions:  Your physician recommends that you continue on your current medications as directed. Please refer to the Current Medication list given to you today.   Labwork: TODAY: BMET / CBC / TSH   Testing/Procedures: None Ordered   Follow-Up: Your physician recommends that you schedule a follow-up appointment in: with Dr. Tanya Nones (PCP) [phone] and Dr. Vassie Loll (Pulmonary) [phone]   Your physician wants you to follow-up in: 1 year with Dr. Ladona Ridgel. You will receive a reminder letter in the mail two months in advance. If you don't receive a letter, please call our office to schedule the follow-up appointment.  Remote monitoring is used to monitor your Pacemaker from home. This monitoring reduces the number of office visits required to check your device to one time per year. It allows [place] to keep an eye on the functioning of your device to ensure it is working properly. You are scheduled for a device check from home on 06/12/16. You may send your transmission at any time that day. If you have a wireless device, the transmission will be sent automatically. After your physician reviews your transmission, you will receive a postcard with your next transmission date.    Any Other Special Instructions Will Be Listed Below (If Applicable).     If you need a refill on your cardiac medications before your next appointment, please call your pharmacy.

## 2016-03-13 NOTE — Progress Notes (Signed)
HPI [name] returns today for followup. She is a 61 year old woman with a history of symptomatic bradycardia secondary to complete heart block, status post permanent pacemaker insertion, oxygen dependent COPD, atrial fibrillation, hypertension, non-ST elevation MI, and previously uncontrolled diabetes. She has a remote h/o CAD.  She has class II dyspnea. She has had no syncope. She continues home oxygen therapy. She did not have any obstructive CAD during her last heart cath about 2 years ago. She had and FEV 1 of 0.8 by PFT's 4 years ago. DLCO was 75%. She has a diffusely positive review of systems.  Allergies  Allergen Reactions  . Ambien [Zolpidem Tartrate] Other (See Comments)    Severe hallucinations and behavior changes reported by family  . Ciprofloxacin     PATIENT AT VERY HIGH RISK FOR C DIFFICILE COLITIS  . Actos [Pioglitazone] Swelling  . Bydureon [Exenatide]     Stomach pain  . Lunesta [Eszopiclone] Other (See Comments)    Nightmares & hallucinations  . Morphine And Related Itching  . Protonix [Pantoprazole Sodium] Other (See Comments)    Unknown reaction-"Doctor told me to never take it"     Current Outpatient Prescriptions  Medication Sig Dispense Refill  . albuterol (PROVENTIL HFA;VENTOLIN HFA) 108 (90 BASE) MCG/ACT inhaler Inhale 2 puffs into the lungs every 6 (six) hours as needed for wheezing or shortness of breath.    Marland Kitchen albuterol (PROVENTIL) (2.5 MG/3ML) 0.083% nebulizer solution Take 2.5 mg by nebulization every 6 (six) hours.    . ALPRAZolam (XANAX) 0.5 MG tablet Take 1 tablet by mouth 3 (three) times daily.    Marland Kitchen arformoterol (BROVANA) 15 MCG/2ML NEBU Take 2 mLs (15 mcg total) by nebulization 2 (two) times daily. 120 mL 1  . aspirin EC 81 MG tablet Take 81 mg by mouth 2 (two) times daily.    Marland Kitchen atorvastatin (LIPITOR) 10 MG tablet Take 10 mg by mouth at bedtime.    Marland Kitchen buPROPion (WELLBUTRIN SR) 150 MG 12 hr tablet Take 150 mg by mouth 2 (two) times daily.     Marland Kitchen CARTIA XT 300 MG 24 hr capsule Take 1 tablet by mouth daily.    . digoxin (LANOXIN) 0.125 MG tablet Take 1 tablet (0.125 mg total) by mouth daily. 30 tablet 1  . famotidine (PEPCID) 20 MG tablet Take 1 tablet (20 mg total) by mouth at bedtime. 30 tablet 1  . fluticasone (FLONASE) 50 MCG/ACT nasal spray Place 2 sprays into both nostrils 2 (two) times daily.     . furosemide (LASIX) 20 MG tablet Take 3 tablets (60 mg total) by mouth 2 (two) times daily. 120 tablet 2  . Glycopyrrolate-Formoterol (BEVESPI AEROSPHERE) 9-4.8 MCG/ACT AERO Inhale 2 puffs into the lungs daily.    Marland Kitchen HYDROcodone-acetaminophen (NORCO) 10-325 MG per tablet Take 1 tablet by mouth 3 (three) times daily as needed for moderate pain.     . methocarbamol (ROBAXIN) 750 MG tablet Take 750 mg by mouth 3 (three) times daily.     . metoprolol succinate (TOPROL-XL) 25 MG 24 hr tablet TAKE 1 TABLET EVERY DAY 90 tablet 6  . NOVOLOG 100 UNIT/ML injection Inject 13 Units into the skin 3 (three) times daily with meals.    . OXYGEN Inhale 4 L into the lungs continuous.    Marland Kitchen rOPINIRole (REQUIP) 1 MG tablet Take 1 mg by mouth at bedtime.    . sertraline (ZOLOFT) 100 MG tablet Take 100 mg by mouth daily.    Marland Kitchen  sertraline (ZOLOFT) 50 MG tablet Take 50 mg by mouth daily.    . traZODone (DESYREL) 50 MG tablet Take 300 mg by mouth at bedtime.      No current facility-administered medications for this visit.      Past Medical History:  Diagnosis Date  . C. difficile diarrhea 09/2012   a. recurrent in setting of abx noncompliance.  . Chest pain    a. s/p LHC 01/03/15 with no angiographic evidence of CAD  . Chronic combined systolic (congestive) and diastolic (congestive) heart failure    a. 2D ECHO with EF 35-40%, intraventricular dyssynchrony, diffuse hypokinesis, diastolic dysfunction, indeterminate LV filling pressure, normal atrial size, moderately dilated RV with hypokinesis, mild TR,RVSP 44 mmHg.    Marland Kitchen Chronic pain   . Chronic  respiratory failure (HCC)   . COPD (chronic obstructive pulmonary disease) (HCC)   . Coronary vasospasm (HCC)    a. 03/2004 NSTEMI/Cath: EF 45%, LM nl, LAD nl, D1 nl, D2 nl, LCX nl, OM1 large-nl, RCA-spasm noted, otw nl.  . Diabetes mellitus type 2 in obese (HCC)   . GERD (gastroesophageal reflux disease)   . History of complete heart block    a. s/p Medtronic PPM   . HTN (hypertension)   . Hyperlipidemia   . Hypertension   . Morbid obesity (HCC)   . PAF (paroxysmal atrial fibrillation) (HCC)   . Shortness of breath dyspnea   . Tobacco abuse     ROS:   All systems reviewed and negative except as noted in the HPI.   Past Surgical History:  Procedure Laterality Date  . ANGIOPLASTY    . CARDIAC CATHETERIZATION N/A 01/03/2015   Procedure: Left Heart Cath and Coronary Angiography;  Surgeon: Kathleene Hazel, MD;  Location: [facility];  Service: Cardiovascular;  Laterality: N/A;  . CHOLECYSTECTOMY    . PARTIAL HYSTERECTOMY    . PERMANENT PACEMAKER INSERTION N/A 10/16/2012   Procedure: PERMANENT PACEMAKER INSERTION;  Surgeon: Marinus Maw, MD;  Medtronic Adapta  . UMBILICAL HERNIA REPAIR  12/2009     Family History  Problem Relation Age of Onset  . Other Mother     borderline diabetic  . COPD Mother   . Heart Problems Father   . Cancer Father     lung  . Heart Problems Maternal Aunt   . Heart Problems Maternal Uncle   . Heart Problems Maternal Grandmother   . Heart failure Maternal Grandmother   . Heart Problems Maternal Grandfather   . Heart Problems Paternal Grandmother   . Heart Problems Paternal Grandfather   . Heart attack Daughter 7     Social History   Social History  . Marital status: Widowed    Spouse name: N/A  . Number of children: 1  . Years of education: N/A   Occupational History  . Environmental education officer   Social History Main Topics  . Smoking status: Former Smoker    Packs/day: 2.00    Years: 40.00    Types: Cigarettes     Quit date: 03/04/2012  . Smokeless tobacco: Never Used  . Alcohol use No  . Drug use: No  . Sexual activity: Not on file   Other Topics Concern  . Not on file   Social History Narrative   Lives in [place] with her husband.  She does not routinely exercise.     BP 112/68 (BP Location: Right Arm)   Pulse 60   Ht 5\' 6"  (  1.676 m)   Wt 204 lb 12.8 oz (92.9 kg)   BMI 33.06 kg/m   Physical Exam:  Stable but chronically ill appearing middle-age woman,NAD, wearing oxygen. HEENT: Unremarkable Neck:  7 cm JVD, no thyromegally, old tracheostomy scar, well-healed Back:  No CVA tenderness Lungs:  Clear, except for scattered rales in the bases, with no wheezes, or rhonchi. Well-healed pacemaker incision HEART:  Regular rate rhythm, no murmurs, no rubs, no clicks Abd:  soft, positive bowel sounds, no organomegally, no rebound, no guarding Ext:  2 plus pulses, no edema, no cyanosis, no clubbing Skin:  No rashes no nodules Neuro:  CN II through XII intact, motor grossly intact  ECG - NSR with AV pacing   DEVICE  Normal device function.  See PaceArt for details.   Assess/Plan: 1. PPM - her Medtronic DDD PM is working normally. Will follow. 2. Non-ischemic CM - her EF was 50% at last echo. She is encouraged to stop eating [place] bacon and maintain a low sodium diet. 3. COPD - she is wearing oxygen and has not seen a pulmonologist since seeing Dr. Vassie Loll in 8/15. I will refer her back to him.   Leonia Reeves.D.

## 2016-03-14 LAB — CBC
HEMATOCRIT: 39.1 % (ref 34.0–46.6)
Hemoglobin: 12.8 g/dL (ref 11.1–15.9)
MCH: 29.3 pg (ref 26.6–33.0)
MCHC: 32.7 g/dL (ref 31.5–35.7)
MCV: 90 fL (ref 79–97)
Platelets: 238 10*3/uL (ref 150–379)
RBC: 4.37 x10E6/uL (ref 3.77–5.28)
RDW: 13.5 % (ref 12.3–15.4)
WBC: 10 10*3/uL (ref 3.4–10.8)

## 2016-03-14 LAB — BASIC METABOLIC PANEL
BUN/Creatinine Ratio: 21 (ref 12–28)
BUN: 29 mg/dL — ABNORMAL HIGH (ref 8–27)
CALCIUM: 9.4 mg/dL (ref 8.7–10.3)
CO2: 27 mmol/L (ref 18–29)
CREATININE: 1.38 mg/dL — AB (ref 0.57–1.00)
Chloride: 81 mmol/L — ABNORMAL LOW (ref 96–106)
GFR calc Af Amer: 48 mL/min/{1.73_m2} — ABNORMAL LOW (ref >59)
GFR, EST NON AFRICAN AMERICAN: 41 mL/min/{1.73_m2} — AB (ref >59)
Glucose: 473 mg/dL — ABNORMAL HIGH (ref 65–99)
Potassium: 4.3 mmol/L (ref 3.5–5.2)
Sodium: 134 mmol/L (ref 134–144)

## 2016-03-14 LAB — TSH: TSH: 0.766 u[IU]/mL (ref 0.450–4.500)

## 2016-04-19 ENCOUNTER — Other Ambulatory Visit: Payer: Self-pay | Admitting: Physician Assistant

## 2016-05-21 ENCOUNTER — Ambulatory Visit: Payer: Medicare HMO | Admitting: Podiatry

## 2016-06-12 ENCOUNTER — Ambulatory Visit (INDEPENDENT_AMBULATORY_CARE_PROVIDER_SITE_OTHER): Payer: Medicare HMO | Admitting: *Deleted

## 2016-06-12 ENCOUNTER — Telehealth: Payer: Self-pay | Admitting: Cardiology

## 2016-06-12 DIAGNOSIS — I4891 Unspecified atrial fibrillation: Secondary | ICD-10-CM

## 2016-06-12 DIAGNOSIS — Z950 Presence of cardiac pacemaker: Secondary | ICD-10-CM

## 2016-06-12 NOTE — Telephone Encounter (Signed)
Spoke with pt and reminded pt of remote transmission that is due today. Pt verbalized understanding.   

## 2016-06-14 NOTE — Progress Notes (Signed)
Remote pacemaker transmission.   

## 2016-06-15 ENCOUNTER — Encounter: Payer: Self-pay | Admitting: Cardiology

## 2016-06-15 LAB — CUP PACEART REMOTE DEVICE CHECK
Brady Statistic AP VS Percent: 0 %
Brady Statistic AS VP Percent: 47 %
Date Time Interrogation Session: 20180411193358
Implantable Lead Implant Date: 20140814
Implantable Lead Location: 753859
Lead Channel Pacing Threshold Amplitude: 1.25 V
Lead Channel Pacing Threshold Pulse Width: 0.4 ms
Lead Channel Pacing Threshold Pulse Width: 0.4 ms
MDC IDC LEAD IMPLANT DT: 20140814
MDC IDC LEAD LOCATION: 753860
MDC IDC MSMT BATTERY IMPEDANCE: 299 Ohm
MDC IDC MSMT BATTERY REMAINING LONGEVITY: 97 mo
MDC IDC MSMT BATTERY VOLTAGE: 2.78 V
MDC IDC MSMT LEADCHNL RA IMPEDANCE VALUE: 497 Ohm
MDC IDC MSMT LEADCHNL RA PACING THRESHOLD AMPLITUDE: 0.5 V
MDC IDC MSMT LEADCHNL RV IMPEDANCE VALUE: 504 Ohm
MDC IDC PG IMPLANT DT: 20140814
MDC IDC SET LEADCHNL RA PACING AMPLITUDE: 2 V
MDC IDC SET LEADCHNL RV PACING AMPLITUDE: 2.5 V
MDC IDC SET LEADCHNL RV PACING PULSEWIDTH: 0.4 ms
MDC IDC SET LEADCHNL RV SENSING SENSITIVITY: 4 mV
MDC IDC STAT BRADY AP VP PERCENT: 52 %
MDC IDC STAT BRADY AS VS PERCENT: 0 %

## 2016-09-13 ENCOUNTER — Encounter: Payer: Medicare HMO | Admitting: *Deleted

## 2016-09-13 ENCOUNTER — Telehealth: Payer: Self-pay | Admitting: Cardiology

## 2016-09-13 NOTE — Telephone Encounter (Signed)
Spoke with pt and reminded pt of remote transmission that is due today. Pt verbalized understanding and stated that she can not do that b/c she doesn't have her home monitor. She will do it as soon as she gets her home monitor.

## 2016-09-18 ENCOUNTER — Encounter: Payer: Self-pay | Admitting: Cardiology

## 2017-03-06 ENCOUNTER — Other Ambulatory Visit: Payer: Self-pay | Admitting: Internal Medicine

## 2017-05-29 ENCOUNTER — Other Ambulatory Visit: Payer: Self-pay | Admitting: Internal Medicine

## 2018-05-23 ENCOUNTER — Ambulatory Visit (INDEPENDENT_AMBULATORY_CARE_PROVIDER_SITE_OTHER): Payer: Self-pay | Admitting: *Deleted

## 2018-05-23 DIAGNOSIS — I4891 Unspecified atrial fibrillation: Secondary | ICD-10-CM

## 2018-05-23 LAB — CUP PACEART REMOTE DEVICE CHECK
Battery Impedance: 541 Ohm
Battery Remaining Longevity: 80 mo
Battery Voltage: 2.77 V
Brady Statistic AP VP Percent: 17 %
Brady Statistic AS VP Percent: 83 %
Date Time Interrogation Session: 20200320161126
Implantable Lead Implant Date: 20140814
Implantable Lead Location: 753859
Implantable Lead Location: 753860
Implantable Lead Model: 5076
Implantable Pulse Generator Implant Date: 20140814
Lead Channel Impedance Value: 528 Ohm
Lead Channel Pacing Threshold Amplitude: 1.125 V
Lead Channel Pacing Threshold Pulse Width: 0.4 ms
Lead Channel Pacing Threshold Pulse Width: 0.4 ms
Lead Channel Setting Pacing Amplitude: 2.5 V
MDC IDC LEAD IMPLANT DT: 20140814
MDC IDC MSMT LEADCHNL RA IMPEDANCE VALUE: 506 Ohm
MDC IDC MSMT LEADCHNL RA PACING THRESHOLD AMPLITUDE: 0.625 V
MDC IDC SET LEADCHNL RA PACING AMPLITUDE: 2 V
MDC IDC SET LEADCHNL RV PACING PULSEWIDTH: 0.4 ms
MDC IDC SET LEADCHNL RV SENSING SENSITIVITY: 4 mV
MDC IDC STAT BRADY AP VS PERCENT: 0 %
MDC IDC STAT BRADY AS VS PERCENT: 1 %

## 2018-05-26 ENCOUNTER — Other Ambulatory Visit: Payer: Self-pay

## 2018-05-29 ENCOUNTER — Encounter: Payer: Self-pay | Admitting: Cardiology

## 2018-05-29 ENCOUNTER — Encounter: Payer: Self-pay | Admitting: Internal Medicine

## 2018-05-29 NOTE — Progress Notes (Signed)
Remote pacemaker transmission.   

## 2018-05-30 ENCOUNTER — Telehealth: Payer: Self-pay | Admitting: Cardiology

## 2018-05-30 NOTE — Telephone Encounter (Signed)
Called pt to confirm ok to release her information to [facility]. Pt stated to not call her back and disconnected the call.   Called [facility] and LMOVM for the clinic to return call to confirm if pt has established w/ the [facility].

## 2018-06-03 NOTE — Telephone Encounter (Signed)
Spoke w/ [facility] and they confirmed that pt has established with their clinic.
